# Patient Record
Sex: Female | Born: 1950 | Race: White | Hispanic: No | Marital: Single | Smoking: Never smoker
Health system: Southern US, Community
[De-identification: ages and names within clinical notes are randomized; demographics above are authoritative.]

## PROBLEM LIST (undated history)

## (undated) DIAGNOSIS — Z8541 Personal history of malignant neoplasm of cervix uteri: Secondary | ICD-10-CM

## (undated) DIAGNOSIS — S68412A Complete traumatic amputation of left hand at wrist level, initial encounter: Secondary | ICD-10-CM

## (undated) DIAGNOSIS — I82409 Acute embolism and thrombosis of unspecified deep veins of unspecified lower extremity: Secondary | ICD-10-CM

## (undated) DIAGNOSIS — C210 Malignant neoplasm of anus, unspecified: Secondary | ICD-10-CM

## (undated) DIAGNOSIS — I10 Essential (primary) hypertension: Secondary | ICD-10-CM

## (undated) DIAGNOSIS — E119 Type 2 diabetes mellitus without complications: Secondary | ICD-10-CM

## (undated) DIAGNOSIS — F329 Major depressive disorder, single episode, unspecified: Secondary | ICD-10-CM

## (undated) DIAGNOSIS — C539 Malignant neoplasm of cervix uteri, unspecified: Secondary | ICD-10-CM

## (undated) DIAGNOSIS — E782 Mixed hyperlipidemia: Secondary | ICD-10-CM

## (undated) DIAGNOSIS — F32A Depression, unspecified: Secondary | ICD-10-CM

## (undated) HISTORY — PX: CERVICAL CONE BIOPSY: SUR198

## (undated) HISTORY — DX: Mixed hyperlipidemia: E78.2

## (undated) HISTORY — DX: Acute embolism and thrombosis of unspecified deep veins of unspecified lower extremity: I82.409

## (undated) HISTORY — DX: Malignant neoplasm of cervix uteri, unspecified: C53.9

## (undated) HISTORY — PX: HAND AMPUTATION: SUR26

## (undated) HISTORY — DX: Type 2 diabetes mellitus without complications: E11.9

## (undated) HISTORY — DX: Malignant neoplasm of anus, unspecified: C21.0

## (undated) HISTORY — DX: Essential (primary) hypertension: I10

## (undated) NOTE — *Deleted (*Deleted)
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 Retrobulbar anesthesia was obtained in the left eye without complication  The  patient was prepped and draped in the usual fashion for ocular surgery on the  left eye .  A lid speculum was placed.  Infusion line and trocar was placed at the 4 o'clock position approximately 3.5 mm from the surgical limbus.   The infusion line was allowed to run and then clamped when placed at the cannula opening. The line was inserted and secured to the drape with an adhesive strip.   Active trocars/cannula were placed at the 10 and 2 o'clock positions approximately 3.5 mm from the surgical limbus. The cannula was visualized in the vitreous cavity.  The light pipe and vitreous cutter were inserted into the vitreous cavity and a core vitrectomy was performed.  Care taken to remove the vitreous up to the vitreous base for 360 degrees.   Attention was directed toward relieving the tractional detachment from the posterior pole in particular peripherally (temporally, inferiorly). This was done carefully at the disc and surrounding arcades. There was notable neovascular fronds with traction and associated hemorrhage inferiorly. Care was taken to elevate the membranes and remove them both with a vitrector and a light pipe with dissection. Hemostasis of the neovascular fronds was performed with endocautery and endolaser. Following hemostasis, continued dissection of membranes and removal of  membranes was performed including the temporal and inferior retina. Endolaser was applied to the areas where the neovascular fronds were still present.  3 rows of endolaser were applied 360 degrees to the periphery.  A complete air-fluid exchange was then performed and additional endolaser was applied. 12% SF6 gas was injected into the eye. The trocars were sequentially removed and all were noted to be airtight. Subconjunctival injections of Ancef and Decadron were placed.   The speculum and drapes were removed and the eye was patched with Polymixin/Bacitracin ophthalmic ointment. An eye shield was placed and the patient was transferred alert and conversant with stable vital signs to the post operative recovery area.  The patient tolerated the procedure well and no complications were noted.   Harrold Donath MD

---
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
## 2010-09-17 ENCOUNTER — Ambulatory Visit (HOSPITAL_COMMUNITY)
Admission: RE | Admit: 2010-09-17 | Discharge: 2010-09-17 | Disposition: A | Discharge status: Home / Self Care | Payer: Self-pay | Source: Ambulatory Visit | Attending: Family Medicine | Admitting: Family Medicine

## 2010-09-17 ENCOUNTER — Other Ambulatory Visit (HOSPITAL_COMMUNITY): Payer: Self-pay | Admitting: Family Medicine

## 2010-09-17 DIAGNOSIS — R05 Cough: Secondary | ICD-10-CM | POA: Insufficient documentation

## 2010-09-17 DIAGNOSIS — R059 Cough, unspecified: Secondary | ICD-10-CM | POA: Insufficient documentation

## 2010-09-17 DIAGNOSIS — R0789 Other chest pain: Secondary | ICD-10-CM | POA: Insufficient documentation

## 2010-09-20 ENCOUNTER — Emergency Department (HOSPITAL_COMMUNITY)
Admission: EM | Admit: 2010-09-20 | Discharge: 2010-09-20 | Disposition: A | Discharge status: Home / Self Care | Payer: Self-pay | Attending: Emergency Medicine | Admitting: Emergency Medicine

## 2010-09-20 DIAGNOSIS — Z79899 Other long term (current) drug therapy: Secondary | ICD-10-CM | POA: Insufficient documentation

## 2010-09-20 DIAGNOSIS — R112 Nausea with vomiting, unspecified: Secondary | ICD-10-CM | POA: Insufficient documentation

## 2010-09-20 DIAGNOSIS — R109 Unspecified abdominal pain: Secondary | ICD-10-CM | POA: Insufficient documentation

## 2010-09-20 DIAGNOSIS — S58119A Complete traumatic amputation at level between elbow and wrist, unspecified arm, initial encounter: Secondary | ICD-10-CM | POA: Insufficient documentation

## 2010-09-20 DIAGNOSIS — E119 Type 2 diabetes mellitus without complications: Secondary | ICD-10-CM | POA: Insufficient documentation

## 2012-01-12 ENCOUNTER — Other Ambulatory Visit (HOSPITAL_COMMUNITY): Payer: Self-pay | Admitting: Physician Assistant

## 2012-01-12 DIAGNOSIS — Z139 Encounter for screening, unspecified: Secondary | ICD-10-CM

## 2012-01-22 ENCOUNTER — Ambulatory Visit (HOSPITAL_COMMUNITY)
Admission: RE | Admit: 2012-01-22 | Discharge: 2012-01-22 | Disposition: A | Discharge status: Home / Self Care | Payer: Self-pay | Source: Ambulatory Visit | Attending: Physician Assistant | Admitting: Physician Assistant

## 2012-01-22 DIAGNOSIS — Z139 Encounter for screening, unspecified: Secondary | ICD-10-CM

## 2012-01-26 ENCOUNTER — Other Ambulatory Visit: Payer: Self-pay | Admitting: Physician Assistant

## 2012-01-26 DIAGNOSIS — R928 Other abnormal and inconclusive findings on diagnostic imaging of breast: Secondary | ICD-10-CM

## 2012-02-23 ENCOUNTER — Other Ambulatory Visit (HOSPITAL_COMMUNITY): Payer: Self-pay | Admitting: Physician Assistant

## 2012-02-23 DIAGNOSIS — R928 Other abnormal and inconclusive findings on diagnostic imaging of breast: Secondary | ICD-10-CM

## 2012-03-08 ENCOUNTER — Other Ambulatory Visit (HOSPITAL_COMMUNITY): Payer: Self-pay | Admitting: *Deleted

## 2012-03-08 DIAGNOSIS — R928 Other abnormal and inconclusive findings on diagnostic imaging of breast: Secondary | ICD-10-CM

## 2012-03-09 ENCOUNTER — Ambulatory Visit (HOSPITAL_COMMUNITY)
Admission: RE | Admit: 2012-03-09 | Discharge: 2012-03-09 | Disposition: A | Discharge status: Home / Self Care | Payer: PRIVATE HEALTH INSURANCE | Source: Ambulatory Visit | Attending: Physician Assistant | Admitting: Physician Assistant

## 2012-03-09 DIAGNOSIS — R928 Other abnormal and inconclusive findings on diagnostic imaging of breast: Secondary | ICD-10-CM | POA: Insufficient documentation

## 2012-04-10 ENCOUNTER — Telehealth: Payer: Self-pay | Admitting: Obstetrics and Gynecology

## 2012-04-10 NOTE — Telephone Encounter (Signed)
Entered in error

## 2012-10-03 ENCOUNTER — Ambulatory Visit (HOSPITAL_COMMUNITY)
Admission: RE | Admit: 2012-10-03 | Discharge: 2012-10-03 | Disposition: A | Discharge status: Home / Self Care | Payer: BC Managed Care – PPO | Source: Ambulatory Visit | Attending: Family Medicine | Admitting: Family Medicine

## 2012-10-03 ENCOUNTER — Other Ambulatory Visit (HOSPITAL_COMMUNITY): Payer: Self-pay | Admitting: Family Medicine

## 2012-10-03 DIAGNOSIS — M25539 Pain in unspecified wrist: Secondary | ICD-10-CM | POA: Insufficient documentation

## 2012-10-03 DIAGNOSIS — M25531 Pain in right wrist: Secondary | ICD-10-CM

## 2012-10-14 ENCOUNTER — Ambulatory Visit: Payer: Self-pay | Admitting: Physician Assistant

## 2012-12-01 ENCOUNTER — Other Ambulatory Visit (HOSPITAL_COMMUNITY): Payer: Self-pay | Admitting: *Deleted

## 2012-12-01 ENCOUNTER — Ambulatory Visit (HOSPITAL_COMMUNITY)
Admission: RE | Admit: 2012-12-01 | Discharge: 2012-12-01 | Disposition: A | Discharge status: Home / Self Care | Payer: BC Managed Care – PPO | Source: Ambulatory Visit | Attending: Family Medicine | Admitting: Family Medicine

## 2012-12-01 ENCOUNTER — Other Ambulatory Visit: Payer: Self-pay | Admitting: Genetic Counselor

## 2012-12-01 DIAGNOSIS — M25539 Pain in unspecified wrist: Secondary | ICD-10-CM | POA: Insufficient documentation

## 2012-12-01 DIAGNOSIS — R202 Paresthesia of skin: Secondary | ICD-10-CM

## 2012-12-01 DIAGNOSIS — R200 Anesthesia of skin: Secondary | ICD-10-CM

## 2013-09-06 ENCOUNTER — Other Ambulatory Visit: Payer: Self-pay | Admitting: Otolaryngology

## 2013-09-06 DIAGNOSIS — H9120 Sudden idiopathic hearing loss, unspecified ear: Secondary | ICD-10-CM

## 2013-09-12 ENCOUNTER — Ambulatory Visit
Admission: RE | Admit: 2013-09-12 | Discharge: 2013-09-12 | Disposition: A | Discharge status: Home / Self Care | Payer: BC Managed Care – PPO | Source: Ambulatory Visit | Attending: Otolaryngology | Admitting: Otolaryngology

## 2013-09-12 DIAGNOSIS — H9120 Sudden idiopathic hearing loss, unspecified ear: Secondary | ICD-10-CM

## 2013-09-12 MED ORDER — GADOBENATE DIMEGLUMINE 529 MG/ML IV SOLN
8.0000 mL | Freq: Once | INTRAVENOUS | Status: AC | PRN
  Administered 2013-09-12: 8 mL via INTRAVENOUS

## 2015-11-27 ENCOUNTER — Other Ambulatory Visit (HOSPITAL_COMMUNITY): Payer: Self-pay | Admitting: Family Medicine

## 2015-11-27 DIAGNOSIS — M7501 Adhesive capsulitis of right shoulder: Secondary | ICD-10-CM

## 2015-11-28 ENCOUNTER — Ambulatory Visit (HOSPITAL_COMMUNITY)
Admission: RE | Admit: 2015-11-28 | Discharge: 2015-11-28 | Disposition: A | Discharge status: Home / Self Care | Payer: Medicare HMO | Source: Ambulatory Visit | Attending: Family Medicine | Admitting: Family Medicine

## 2015-11-28 DIAGNOSIS — M25511 Pain in right shoulder: Secondary | ICD-10-CM | POA: Diagnosis present

## 2015-11-28 DIAGNOSIS — M7501 Adhesive capsulitis of right shoulder: Secondary | ICD-10-CM

## 2016-02-14 ENCOUNTER — Encounter (INDEPENDENT_AMBULATORY_CARE_PROVIDER_SITE_OTHER): Payer: Self-pay | Admitting: Ophthalmology

## 2016-10-01 ENCOUNTER — Other Ambulatory Visit (HOSPITAL_COMMUNITY): Payer: Self-pay | Admitting: Family Medicine

## 2016-10-01 ENCOUNTER — Ambulatory Visit (HOSPITAL_COMMUNITY)
Admission: RE | Admit: 2016-10-01 | Discharge: 2016-10-01 | Disposition: A | Discharge status: Home / Self Care | Payer: Medicare HMO | Source: Ambulatory Visit | Attending: Family Medicine | Admitting: Family Medicine

## 2016-10-01 DIAGNOSIS — R29898 Other symptoms and signs involving the musculoskeletal system: Secondary | ICD-10-CM | POA: Insufficient documentation

## 2016-10-01 DIAGNOSIS — I998 Other disorder of circulatory system: Secondary | ICD-10-CM | POA: Diagnosis not present

## 2017-03-18 DIAGNOSIS — H9122 Sudden idiopathic hearing loss, left ear: Secondary | ICD-10-CM | POA: Insufficient documentation

## 2017-03-18 DIAGNOSIS — H9113 Presbycusis, bilateral: Secondary | ICD-10-CM | POA: Insufficient documentation

## 2017-05-18 HISTORY — PX: CATARACT EXTRACTION, BILATERAL: SHX1313
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
## 2017-05-20 ENCOUNTER — Ambulatory Visit (INDEPENDENT_AMBULATORY_CARE_PROVIDER_SITE_OTHER): Payer: Medicare HMO | Admitting: Ophthalmology

## 2017-05-20 ENCOUNTER — Encounter (INDEPENDENT_AMBULATORY_CARE_PROVIDER_SITE_OTHER): Payer: Self-pay | Admitting: Ophthalmology

## 2017-05-20 DIAGNOSIS — H3581 Retinal edema: Secondary | ICD-10-CM | POA: Diagnosis not present

## 2017-05-20 DIAGNOSIS — E113493 Type 2 diabetes mellitus with severe nonproliferative diabetic retinopathy without macular edema, bilateral: Secondary | ICD-10-CM | POA: Diagnosis not present

## 2017-05-20 DIAGNOSIS — H4323 Crystalline deposits in vitreous body, bilateral: Secondary | ICD-10-CM

## 2017-05-20 DIAGNOSIS — H53001 Unspecified amblyopia, right eye: Secondary | ICD-10-CM

## 2017-05-20 DIAGNOSIS — H25813 Combined forms of age-related cataract, bilateral: Secondary | ICD-10-CM

## 2017-05-20 DIAGNOSIS — Z9889 Other specified postprocedural states: Secondary | ICD-10-CM

## 2017-05-21 ENCOUNTER — Encounter (INDEPENDENT_AMBULATORY_CARE_PROVIDER_SITE_OTHER): Payer: Self-pay | Admitting: Ophthalmology
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
## 2017-07-14 ENCOUNTER — Encounter (INDEPENDENT_AMBULATORY_CARE_PROVIDER_SITE_OTHER): Payer: Medicare HMO | Admitting: Ophthalmology

## 2019-01-24 ENCOUNTER — Ambulatory Visit: Payer: Medicare HMO | Admitting: General Surgery

## 2019-01-24 ENCOUNTER — Encounter: Payer: Self-pay | Admitting: General Surgery

## 2019-01-24 ENCOUNTER — Other Ambulatory Visit: Payer: Self-pay

## 2019-01-24 ENCOUNTER — Other Ambulatory Visit (HOSPITAL_COMMUNITY)
Admission: RE | Admit: 2019-01-24 | Discharge: 2019-01-24 | Disposition: A | Discharge status: Home / Self Care | Payer: Medicare HMO | Source: Ambulatory Visit | Attending: General Surgery | Admitting: General Surgery

## 2019-01-24 ENCOUNTER — Encounter (INDEPENDENT_AMBULATORY_CARE_PROVIDER_SITE_OTHER): Payer: Self-pay

## 2019-01-24 VITALS — BP 130/77 | HR 83 | Temp 97.3°F | Resp 16 | Ht 64.5 in | Wt 212.0 lb

## 2019-01-24 DIAGNOSIS — Z01812 Encounter for preprocedural laboratory examination: Secondary | ICD-10-CM | POA: Insufficient documentation

## 2019-01-24 DIAGNOSIS — Z20828 Contact with and (suspected) exposure to other viral communicable diseases: Secondary | ICD-10-CM | POA: Diagnosis not present

## 2019-01-24 DIAGNOSIS — K6289 Other specified diseases of anus and rectum: Secondary | ICD-10-CM

## 2019-01-24 MED ORDER — SILVER SULFADIAZINE 1 % EX CREA: TOPICAL_CREAM | CUTANEOUS | 1 refills | Status: DC

## 2019-01-24 NOTE — Patient Instructions (Addendum)
Continue to use barrier creams as needed like Desitin for your anal region.   Anal Cancer  Anal cancer is a disease where cancer cells form in the tissue of the anus. The anus the last part of the large intestine, where stool leaves the body. The most common type of anal cancer is called squamous cell carcinoma. What are the causes? The cause of this condition is not known. What increases the risk? You are more likely to develop this condition if:  You have HPV (human papillomavirus).  You engage in sexual practices that increase your risk of HPV. These include: ? Having many sexual partners. ? Having anal sex. ? Being female and having sex with other males.  You smoke cigarettes.  You have a history of cervical, vaginal, or vulvar cancer.  You have a weakened immune system due to: ? Medicines called immunosuppressants. ? Infection with HIV (human immunodeficiency virus) or AIDS (acquired immunodeficiency syndrome).  You have an autoimmune disorder like Crohn disease or psoriasis.  You have a history of STIs (sexually transmitted infections). What are the signs or symptoms? Symptoms of this condition include:  Pain or pressure around the anus.  Bleeding from the anus or rectum.  A lump near the anus.  A change in bowel habits.  Itching around the anus.  Discharge from the anus.  Swollen lymph nodes around the anus or groin. How is this diagnosed? This condition is diagnosed based on a review of your medical history and the results of a physical exam and tests. Exams and tests may include:  A digital rectal exam. During this exam, a gloved, lubricated finger is inserted into the anus and rectum to check for lumps.  Anoscopy. During this test, a hollow tube is inserted into the anus and rectum. A health care provider looks through the tube for lumps and signs of disease.  Proctoscopy. During this test, a lighted, hollow tube is inserted into the rectum. A health care  provider looks through this tube to see the lower part of the colon.  Endoanal or endorectal ultrasound. During this test, a small probe is inserted into the anus or rectum.  Biopsy. During this test, a tissue sample is taken from the anus and rectum to be examined under a microscope for signs of cancer. Your health care provider may refer you to an expert who specializes in diagnosing and treating anal cancer. If you are diagnosed with cancer, you may need to have more tests to see if the cancer has spread to other parts of the body. These tests can include:  X-ray.  CT scan.  MRI.  PET scan. How is this treated? Treatment for this condition depends on where the tumor is, the type of cancer, and how much the cancer has spread in the body (the stage of the cancer). Treatment can include any combination of the following:  Surgery. This may be done to remove the tumor and any lymph nodes that are infected with cancer. If the cancer is severe, surgery may be done to remove the anus, rectum, and part of the colon.  Radiation therapy. This treatment uses high energy radiation or X-rays to kill cancer cells or stop them from growing.  Chemotherapy. This treatment involves drugs that kill cancer cells or stop them from growing. Follow these instructions at home: Learning about your cancer  Learn about your cancer and your treatment options. Make sure you understand the potential side effects of treatment.  Ask about getting a  second opinion. This can help you make a more informed decision about your treatment options. Eating and drinking  Drink enough fluid to keep your urine clear or pale yellow.  Limit alcohol intake to no more than 1 drink a day for nonpregnant women and 2 drinks a day for men. One drink equals 12 oz of beer, 5 oz of wine, or 1 oz of hard liquor.  Eat a healthy diet. When planning meals: ? Aim to get 2  cups of fruits and vegetables a day. ? Choose high-fiber foods  like whole-grain breads and cereals. Activity  During and after treatment, return to your normal activities as told by your health care provider. Ask your health care provider what activities are safe for you.  Exercise regularly. Aim for 30 minutes of moderate-intensity exercise 5 days a week, such as walking or yoga.  Talk with your health care provider before starting any exercise routine. This is important. General instructions   Take over-the-counter and prescription medicines only as told by your health care provider.  Do not use any products that contain nicotine or tobacco, such as cigarettes and e-cigarettes. If you need help quitting, ask your health care provider.  Keep all follow-up visits as told by your health care provider. This is important. How is this prevented? You cannot prevent this condition completely, but you can lower your risk of developing it by:  Getting the HPV vaccine.  Avoiding infection with HPV and HIV. You can do this by: ? Limiting your number of sexual partners. ? Using protection, like condoms, during all sexual activity. Note that condoms cannot completely protect you from HPV.  Not smoking. If you need help quitting, ask your health care provider. Contact a health care provider if:  You have bleeding or discharge from your anus.  You have pain or pressure near your anus.  You have a change in bowel habits or diarrhea.  You have nausea or vomiting. Get help right away if:  You have a fever.  You have chest pains or shortness of breath.  You have a severe headache with a stiff neck.  You have bloody or cloudy urine.  You are confused.  You have any swelling in your legs or arms or around a wound. Summary  Anal cancer is a disease where cancer cells form in the tissue of the anus.  You are more likely to develop anal cancer if you are infected with HPV, you smoke cigarettes, or if you have a weakened immune system due to  medicines, HIV, or AIDS.  Treatment for anal cancer can include surgery, radiation therapy, or chemotherapy.  You can lower your risk for anal cancer by getting the HPV vaccine, not smoking, and avoiding infection with HPV and HIV. This information is not intended to replace advice given to you by your health care provider. Make sure you discuss any questions you have with your health care provider. Document Released: 03/23/2016 Document Revised: 03/23/2016 Document Reviewed: 03/23/2016 Elsevier Patient Education  2020 [employer].

## 2019-01-24 NOTE — Patient Instructions (Addendum)
Your procedure is scheduled on: 01/26/2019  Report to [facility] at    9:30 AM.  Call this number if you have problems the morning of surgery: [phone]   Remember:   Do not Eat or Drink after midnight   :  Take these medicines the morning of surgery with A SIP OF WATER: gabapentin, trintellix, and lisinopril.   Do not wear jewelry, make-up or nail polish.  Do not wear lotions, powders, or perfumes. You may wear deodorant.  Do not shave 48 hours prior to surgery. Men may shave face and neck.  Do not bring valuables to the hospital.  Contacts, dentures or bridgework may not be worn into surgery.  Leave suitcase in the car. After surgery it may be brought to your room.  For patients admitted to the hospital, checkout time is 11:00 AM the day of discharge.   Patients discharged the day of surgery will not be allowed to drive home.    Special Instructions: Shower using CHG night before surgery and shower the day of surgery use CHG.  Use special wash - you have one bottle of CHG for all showers.  You should use approximately 1/2 of the bottle for each shower.  Wound Care, Adult Taking care of your wound properly can help to prevent pain, infection, and scarring. It can also help your wound to heal more quickly. How to care for your wound Wound care      Follow instructions from your health care provider about how to take care of your wound. Make sure you: ? Wash your hands with soap and water before you change the bandage (dressing). If soap and water are not available, use hand sanitizer. ? Change your dressing as told by your health care provider. ? Leave stitches (sutures), skin glue, or adhesive strips in place. These skin closures may need to stay in place for 2 weeks or longer. If adhesive strip edges start to loosen and curl up, you may trim the loose edges. Do not remove adhesive strips completely unless your health care provider tells you to do that.  Check your wound area  every day for signs of infection. Check for: ? Redness, swelling, or pain. ? Fluid or blood. ? Warmth. ? Pus or a bad smell.  Ask your health care provider if you should clean the wound with mild soap and water. Doing this may include: ? Using a clean towel to pat the wound dry after cleaning it. Do not rub or scrub the wound. ? Applying a cream or ointment. Do this only as told by your health care provider. ? Covering the incision with a clean dressing.  Ask your health care provider when you can leave the wound uncovered.  Keep the dressing dry until your health care provider says it can be removed. Do not take baths, swim, use a hot tub, or do anything that would put the wound underwater until your health care provider approves. Ask your health care provider if you can take showers. You may only be allowed to take sponge baths. Medicines   If you were prescribed an antibiotic medicine, cream, or ointment, take or use the antibiotic as told by your health care provider. Do not stop taking or using the antibiotic even if your condition improves.  Take over-the-counter and prescription medicines only as told by your health care provider. If you were prescribed pain medicine, take it 30 or more minutes before you do any wound care or as  told by your health care provider. General instructions  Return to your normal activities as told by your health care provider. Ask your health care provider what activities are safe.  Do not scratch or pick at the wound.  Do not use any products that contain nicotine or tobacco, such as cigarettes and e-cigarettes. These may delay wound healing. If you need help quitting, ask your health care provider.  Keep all follow-up visits as told by your health care provider. This is important.  Eat a diet that includes protein, vitamin A, vitamin C, and other nutrient-rich foods to help the wound heal. ? Foods rich in protein include meat, dairy, beans, nuts,  and other sources. ? Foods rich in vitamin A include carrots and dark green, leafy vegetables. ? Foods rich in vitamin C include citrus, tomatoes, and other fruits and vegetables. ? Nutrient-rich foods have protein, carbohydrates, fat, vitamins, or minerals. Eat a variety of healthy foods including vegetables, fruits, and whole grains. Contact a health care provider if:  You received a tetanus shot and you have swelling, severe pain, redness, or bleeding at the injection site.  Your pain is not controlled with medicine.  You have redness, swelling, or pain around the wound.  You have fluid or blood coming from the wound.  Your wound feels warm to the touch.  You have pus or a bad smell coming from the wound.  You have a fever or chills.  You are nauseous or you vomit.  You are dizzy. Get help right away if:  You have a red streak going away from your wound.  The edges of the wound open up and separate.  Your wound is bleeding, and the bleeding does not stop with gentle pressure.  You have a rash.  You faint.  You have trouble breathing. Summary  Always wash your hands with soap and water before changing your bandage (dressing).  To help with healing, eat foods that are rich in protein, vitamin A, vitamin C, and other nutrients.  Check your wound every day for signs of infection. Contact your health care provider if you suspect that your wound is infected. This information is not intended to replace advice given to you by your health care provider. Make sure you discuss any questions you have with your health care provider. Document Released: 02/11/2008 Document Revised: 08/22/2018 Document Reviewed: 11/19/2015 Elsevier Patient Education  2020 [employer].

## 2019-01-24 NOTE — Progress Notes (Signed)
[facility] History and Physical  Reason for Referral: ? Rectal Fissure   Referring Physician: Dr. Cindie Laroche   Chief Complaint    Rectal Problems      Jacqueline Bird is a 68 y.o. female.  HPI: Jacqueline Bird is a 68 yo who reports starting to have issues with constipation in February that started without any change in her diet. She says that she takes some stool softeners, and will have a soft stool about every 3-4 days.  She has been having associated issues with severe burning of the anal region and does not feel like she can get clean. She says that she has to wipe multiple times and will stay soiled for 24 hours after her BM.  She reports that she has been using some silver nitrate cream she had from a prior burn on the area, and this is the only thing that gives her relief.  She does say that there is some blood on the toilet paper but not in the stool.  She denies any weight loss.  She says that she has never had a colonoscopy and she did have a cervical cone performed in 1980s for an abnormal pap smear.  She denies any tearing sensation or sharp pain with Bms. The stools are now mostly soft but she does have some pebbles at times. She describes mostly a severe raw/ burning feeling around her anus.  She has not had any type of anal or rectal exam since these issues started.   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, left ([place])    Depression    Diabetes mellitus without complication ([place])    History of cervical cancer    Hypertension     Past Surgical History:  Procedure Laterality Date   CATARACT EXTRACTION, BILATERAL     CERVICAL CONE BIOPSY     HAND AMPUTATION Left     Family History  Problem Relation Age of Onset   Cataracts Mother    Glaucoma Mother    Diabetes Father    Cataracts Sister    Diabetes Sister    Cataracts Brother    Diabetes Brother    Macular degeneration Maternal Aunt    Diabetes Maternal  Grandfather    Amblyopia Neg Hx    Blindness Neg Hx    Retinal detachment Neg Hx    Strabismus Neg Hx    Retinitis pigmentosa Neg Hx     Social History   Tobacco Use   Smoking status: Never Smoker   Smokeless tobacco: Never Used  Substance Use Topics   Alcohol use: Not Currently    Frequency: Never   Drug use: Never    Medications: I have reviewed the patient's current medications. Allergies as of 01/24/2019      Reactions   Sulfa Antibiotics    Sulfamethoxazole Other (See Comments)   Tongue swelling      Medication List       Accurate as of January 24, 2019 11:59 PM. If you have any questions, ask your nurse or doctor.        gabapentin 100 MG capsule Commonly known as: NEURONTIN Take 100 mg by mouth 3 (three) times daily.   Invokana 300 MG Tabs tablet Generic drug: canagliflozin Take 300 mg by mouth daily.   lisinopril 20 MG tablet Commonly known as: ZESTRIL Take 20 mg by mouth daily.   nabumetone 750 MG tablet Commonly known as: RELAFEN Take 750 mg by mouth  2 (two) times daily.   silver sulfADIAZINE 1 % cream Commonly known as: SILVADENE Apply to affected area daily Started by: Virl Cagey, MD   simvastatin 40 MG tablet Commonly known as: ZOCOR TAKE 1 TABLET BY MOUTH ONCE DAILY DISCONTINUE PRAVASTATIN   traZODone 50 MG tablet Commonly known as: DESYREL Take 50 mg by mouth at bedtime.   Tyler Aas FlexTouch 200 UNIT/ML Sopn Generic drug: Insulin Degludec   Trintellix 10 MG Tabs tablet Generic drug: vortioxetine HBr Take 10 mg by mouth daily.   Trulicity [id number] Sopn Generic drug: Dulaglutide INJECT THE CONTENTS OF ONE SYRINGE SUBCUTANEOUSLY ONCE A WEEK        ROS:  A comprehensive review of systems was negative except for: Gastrointestinal: positive for burning pain around anus, constipation Genitourinary: positive for no urinary incontinence or urgency  Blood pressure 130/77, pulse 83, temperature (!) 97.3 F (36.3  C), temperature source Tympanic, resp. rate 16, height 5' 4.5" (1.638 m), weight 212 lb (96.2 kg), SpO2 97 %. Physical Exam Vitals signs reviewed.  Constitutional:      Appearance: Normal appearance.  HENT:     Head: Normocephalic and atraumatic.     Nose: Nose normal.     Mouth/Throat:     Mouth: Mucous membranes are moist.  Eyes:     Extraocular Movements: Extraocular movements intact.     Pupils: Pupils are equal, round, and reactive to light.  Neck:     Musculoskeletal: Normal range of motion. No neck rigidity.  Cardiovascular:     Rate and Rhythm: Normal rate and regular rhythm.  Pulmonary:     Effort: Pulmonary effort is normal.     Breath sounds: Normal breath sounds.  Abdominal:     General: There is no distension.     Palpations: Abdomen is soft.     Tenderness: There is no abdominal tenderness.  Genitourinary:    Rectum: Mass present.     Comments: Rectal mass anterior with ulceration and irregular edge border extending from anal verge onto the perianal skin, internal exam with nodular mass anterior, tender Musculoskeletal: Normal range of motion.        General: No swelling.     Comments: Left hand prosthetic in place  Lymphadenopathy:     Cervical: No cervical adenopathy.     Lower Body: No right inguinal adenopathy. No left inguinal adenopathy.     Comments: Fatty groin but no appreciable nodes  Skin:    General: Skin is warm and dry.  Neurological:     General: No focal deficit present.     Mental Status: She is alert and oriented to person, place, and time.  Psychiatric:        Mood and Affect: Mood normal.        Behavior: Behavior normal.        Thought Content: Thought content normal.        Judgment: Judgment normal.       Results: None  Assessment & Plan:  Jacqueline Bird is a 68 y.o. female with an anal mass and an ulcerated lesions with irregular borders extending out into the perianal skin. This is anal cancer until proven otherwise.  She  has had a prior abnormal pap smear and cervical cone which correlates.  This explains her feelings of constipation and burning.    -Discussed that this is most likely anal cancer given the appearance. Discussed that anal cancer is mostly treated with the [facility] protocol  -Discussed getting a  tissue diagnosis with a biopsy under anesthesia given the sensitivity in the area and to allow for a better exam -I am unsure how long it has been since the patient's last pelvic exam and did not get to ask her this, but she will potentially need this investigated also in the near future, her prior cervical cone was in the 1980s -Discussed referral to Oncology and need for port a catheter in the future. She has just went through throat cancer with her son and I believe he passed, so she wants to take things one step at a time.  -Patient requested refill of silvadene cream. She had this and was using it on the area and says it is the only thing that has given her any relief from the burning. I suggested Desitin or other barrier cream, but she was very adamant and tearful about wanting to continue the silvadene.  I do not see any immediate harm in this, and have sent her in a refill at this time.  -We discussed COVID testing and isolation prior to any procedures   All questions were answered to the satisfaction of the patient and family.  The risk and benefits of exam under anesthesia and anal mass biopsy were discussed including but not limited to bleeding, infection, inadequate tissue for diagnosis.  After careful consideration, Jacqueline Bird has decided to proceed.   I spent over 60 minutes counseling this patient on the findings and my concerns, and speaking with her fiance about the same once he as brought into the room.  We discussed the need for biopsy to get a diagnosis and the plan moving forward as well as the risk stated above with the procedure.     Virl Cagey 01/25/2019, 1:43 PM

## 2019-01-25 ENCOUNTER — Encounter: Payer: Self-pay | Admitting: General Surgery

## 2019-01-25 ENCOUNTER — Encounter (HOSPITAL_COMMUNITY): Payer: Self-pay

## 2019-01-25 ENCOUNTER — Other Ambulatory Visit: Payer: Self-pay | Admitting: General Surgery

## 2019-01-25 ENCOUNTER — Other Ambulatory Visit: Payer: Self-pay

## 2019-01-25 ENCOUNTER — Encounter (HOSPITAL_COMMUNITY)
Admission: RE | Admit: 2019-01-25 | Discharge: 2019-01-25 | Disposition: A | Discharge status: Home / Self Care | Payer: Medicare HMO | Source: Ambulatory Visit | Attending: General Surgery | Admitting: General Surgery

## 2019-01-25 ENCOUNTER — Encounter (HOSPITAL_COMMUNITY): Payer: Self-pay | Admitting: Lab

## 2019-01-25 DIAGNOSIS — E119 Type 2 diabetes mellitus without complications: Secondary | ICD-10-CM | POA: Diagnosis not present

## 2019-01-25 DIAGNOSIS — Z833 Family history of diabetes mellitus: Secondary | ICD-10-CM | POA: Diagnosis not present

## 2019-01-25 DIAGNOSIS — Z9842 Cataract extraction status, left eye: Secondary | ICD-10-CM | POA: Diagnosis not present

## 2019-01-25 DIAGNOSIS — C210 Malignant neoplasm of anus, unspecified: Secondary | ICD-10-CM | POA: Diagnosis not present

## 2019-01-25 DIAGNOSIS — F329 Major depressive disorder, single episode, unspecified: Secondary | ICD-10-CM | POA: Diagnosis not present

## 2019-01-25 DIAGNOSIS — Z89112 Acquired absence of left hand: Secondary | ICD-10-CM | POA: Diagnosis not present

## 2019-01-25 DIAGNOSIS — Z820 Family history of epilepsy and other diseases of the nervous system: Secondary | ICD-10-CM | POA: Diagnosis not present

## 2019-01-25 DIAGNOSIS — Z9841 Cataract extraction status, right eye: Secondary | ICD-10-CM | POA: Diagnosis not present

## 2019-01-25 DIAGNOSIS — I10 Essential (primary) hypertension: Secondary | ICD-10-CM | POA: Diagnosis not present

## 2019-01-25 DIAGNOSIS — Z8541 Personal history of malignant neoplasm of cervix uteri: Secondary | ICD-10-CM | POA: Diagnosis not present

## 2019-01-25 DIAGNOSIS — R229 Localized swelling, mass and lump, unspecified: Secondary | ICD-10-CM | POA: Diagnosis present

## 2019-01-25 HISTORY — DX: Depression, unspecified: F32.A

## 2019-01-25 HISTORY — DX: Personal history of malignant neoplasm of cervix uteri: Z85.41

## 2019-01-25 HISTORY — DX: Complete traumatic amputation of left hand at wrist level, initial encounter: S68.412A

## 2019-01-25 LAB — CBC
HCT: 42.5 % (ref 36.0–46.0)
Hemoglobin: 13.5 g/dL (ref 12.0–15.0)
MCH: 29.4 pg (ref 26.0–34.0)
MCHC: 31.8 g/dL (ref 30.0–36.0)
MCV: 92.6 fL (ref 80.0–100.0)
Platelets: 235 10*3/uL (ref 150–400)
RBC: 4.59 MIL/uL (ref 3.87–5.11)
RDW: 12.5 % (ref 11.5–15.5)
WBC: 7.2 10*3/uL (ref 4.0–10.5)
nRBC: 0 % (ref 0.0–0.2)

## 2019-01-25 LAB — GLUCOSE, CAPILLARY: Glucose-Capillary: 132 mg/dL — ABNORMAL HIGH (ref 70–99)

## 2019-01-25 LAB — BASIC METABOLIC PANEL
Anion gap: 8 (ref 5–15)
BUN: 36 mg/dL — ABNORMAL HIGH (ref 8–23)
CO2: 25 mmol/L (ref 22–32)
Calcium: 9 mg/dL (ref 8.9–10.3)
Chloride: 104 mmol/L (ref 98–111)
Creatinine, Ser: 0.92 mg/dL (ref 0.44–1.00)
GFR calc Af Amer: >60 mL/min (ref >60)
GFR calc non Af Amer: >60 mL/min (ref >60)
Glucose, Bld: 125 mg/dL — ABNORMAL HIGH (ref 70–99)
Potassium: 4.2 mmol/L (ref 3.5–5.1)
Sodium: 137 mmol/L (ref 135–145)

## 2019-01-25 LAB — SARS CORONAVIRUS 2 (TAT 6-24 HRS): SARS Coronavirus 2: NEGATIVE

## 2019-01-25 NOTE — H&P (Signed)
[facility] History and Physical  Reason for Referral: ? Rectal Fissure   Referring Physician: Dr. Cindie Laroche      Chief Complaint    Rectal Problems      Jacqueline Bird is a 68 y.o. female.  HPI: Jacqueline Bird is a 68 yo who reports starting to have issues with constipation in February that started without any change in her diet. She says that she takes some stool softeners, and will have a soft stool about every 3-4 days.  She has been having associated issues with severe burning of the anal region and does not feel like she can get clean. She says that she has to wipe multiple times and will stay soiled for 24 hours after her BM.  She reports that she has been using some silver nitrate cream she had from a prior burn on the area, and this is the only thing that gives her relief.  She does say that there is some blood on the toilet paper but not in the stool.  She denies any weight loss.  She says that she has never had a colonoscopy and she did have a cervical cone performed in 1980s for an abnormal pap smear.  She denies any tearing sensation or sharp pain with Bms. The stools are now mostly soft but she does have some pebbles at times. She describes mostly a severe raw/ burning feeling around her anus.  She has not had any type of anal or rectal exam since these issues started.       Past Medical History:  Diagnosis Date  . 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 Depression   . Diabetes mellitus without complication ([place])   . History of cervical cancer   . Hypertension          Past Surgical History:  Procedure Laterality Date  . CATARACT EXTRACTION, BILATERAL    . CERVICAL CONE BIOPSY    . HAND AMPUTATION Left          Family History  Problem Relation Age of Onset  . Cataracts Mother   . Glaucoma Mother   . Diabetes Father   . Cataracts Sister   . Diabetes Sister   . Cataracts Brother   . Diabetes Brother   . Macular  degeneration Maternal Aunt   . Diabetes Maternal Grandfather   . Amblyopia Neg Hx   . Blindness Neg Hx   . Retinal detachment Neg Hx   . Strabismus Neg Hx   . Retinitis pigmentosa Neg Hx     Social History        Tobacco Use  . Smoking status: Never Smoker  . Smokeless tobacco: Never Used  Substance Use Topics  . Alcohol use: Not Currently    Frequency: Never  . Drug use: Never    Medications: I have reviewed the patient's current medications.      Allergies as of 01/24/2019      Reactions   Sulfa Antibiotics    Sulfamethoxazole Other (See Comments)   Tongue swelling         Medication List       Accurate as of January 24, 2019 11:59 PM. If you have any questions, ask your nurse or doctor.        gabapentin 100 MG capsule Commonly known as: NEURONTIN Take 100 mg by mouth 3 (three) times daily.   Invokana 300 MG Tabs tablet Generic drug: canagliflozin Take 300 mg by mouth daily.  lisinopril 20 MG tablet Commonly known as: ZESTRIL Take 20 mg by mouth daily.   nabumetone 750 MG tablet Commonly known as: RELAFEN Take 750 mg by mouth 2 (two) times daily.   silver sulfADIAZINE 1 % cream Commonly known as: SILVADENE Apply to affected area daily Started by: Virl Cagey, MD   simvastatin 40 MG tablet Commonly known as: ZOCOR TAKE 1 TABLET BY MOUTH ONCE DAILY DISCONTINUE PRAVASTATIN   traZODone 50 MG tablet Commonly known as: DESYREL Take 50 mg by mouth at bedtime.   Tyler Aas FlexTouch 200 UNIT/ML Sopn Generic drug: Insulin Degludec   Trintellix 10 MG Tabs tablet Generic drug: vortioxetine HBr Take 10 mg by mouth daily.   Trulicity [id number] Sopn Generic drug: Dulaglutide INJECT THE CONTENTS OF ONE SYRINGE SUBCUTANEOUSLY ONCE A WEEK        ROS:  A comprehensive review of systems was negative except for: Gastrointestinal: positive for burning pain around anus, constipation Genitourinary: positive for  no urinary incontinence or urgency  Blood pressure 130/77, pulse 83, temperature (!) 97.3 F (36.3 C), temperature source Tympanic, resp. rate 16, height 5' 4.5" (1.638 m), weight 212 lb (96.2 kg), SpO2 97 %. Physical Exam Vitals signs reviewed.  Constitutional:      Appearance: Normal appearance.  HENT:     Head: Normocephalic and atraumatic.     Nose: Nose normal.     Mouth/Throat:     Mouth: Mucous membranes are moist.  Eyes:     Extraocular Movements: Extraocular movements intact.     Pupils: Pupils are equal, round, and reactive to light.  Neck:     Musculoskeletal: Normal range of motion. No neck rigidity.  Cardiovascular:     Rate and Rhythm: Normal rate and regular rhythm.  Pulmonary:     Effort: Pulmonary effort is normal.     Breath sounds: Normal breath sounds.  Abdominal:     General: There is no distension.     Palpations: Abdomen is soft.     Tenderness: There is no abdominal tenderness.  Genitourinary:    Rectum: Mass present.     Comments: Rectal mass anterior with ulceration and irregular edge border extending from anal verge onto the perianal skin, internal exam with nodular mass anterior, tender Musculoskeletal: Normal range of motion.        General: No swelling.     Comments: Left hand prosthetic in place  Lymphadenopathy:     Cervical: No cervical adenopathy.     Lower Body: No right inguinal adenopathy. No left inguinal adenopathy.     Comments: Fatty groin but no appreciable nodes  Skin:    General: Skin is warm and dry.  Neurological:     General: No focal deficit present.     Mental Status: She is alert and oriented to person, place, and time.  Psychiatric:        Mood and Affect: Mood normal.        Behavior: Behavior normal.        Thought Content: Thought content normal.        Judgment: Judgment normal.       Results: None  Assessment & Plan:  Jacqueline Bird is a 68 y.o. female with an anal mass and an ulcerated lesions with  irregular borders extending out into the perianal skin. This is anal cancer until proven otherwise.  She has had a prior abnormal pap smear and cervical cone which correlates.  This explains her feelings of constipation and burning.    -  Discussed that this is most likely anal cancer given the appearance. Discussed that anal cancer is mostly treated with the [facility] protocol  -Discussed getting a tissue diagnosis with a biopsy under anesthesia given the sensitivity in the area and to allow for a better exam -I am unsure how long it has been since the patient's last pelvic exam and did not get to ask her this, but she will potentially need this investigated also in the near future, her prior cervical cone was in the 1980s -Discussed referral to Oncology and need for port a catheter in the future. She has just went through throat cancer with her son and I believe he passed, so she wants to take things one step at a time.  -Patient requested refill of silvadene cream. She had this and was using it on the area and says it is the only thing that has given her any relief from the burning. I suggested Desitin or other barrier cream, but she was very adamant and tearful about wanting to continue the silvadene.  I do not see any immediate harm in this, and have sent her in a refill at this time.  -We discussed COVID testing and isolation prior to any procedures   All questions were answered to the satisfaction of the patient and family.  The risk and benefits of exam under anesthesia and anal mass biopsy were discussed including but not limited to bleeding, infection, inadequate tissue for diagnosis.  After careful consideration, Celsie Stallard has decided to proceed.   I spent over 60 minutes counseling this patient on the findings and my concerns, and speaking with her fiance about the same once he as brought into the room.  We discussed the need for biopsy to get a diagnosis and the plan moving forward as  well as the risk stated above with the procedure.     Virl Cagey 01/25/2019, 1:43 PM

## 2019-01-25 NOTE — Progress Notes (Signed)
Patient originally scheduled for new patient oncology consult for 02/01/2019.  Patient called on 01/25/19 and requested the appointment be rescheduled due to a conflicting appointment.  Patient requested the 17th (which we did not have available) or the following week.  Asked the patient if she was certain she wanted to wait longer for an appointment to which she replied, "I'm in the shape I am in and is it really urgent?"  Appt rescheduled for  02/07/2019.  Dr. Constance Haw office notified.

## 2019-01-26 ENCOUNTER — Other Ambulatory Visit: Payer: Self-pay

## 2019-01-26 ENCOUNTER — Ambulatory Visit (HOSPITAL_COMMUNITY): Payer: Medicare HMO | Admitting: Anesthesiology

## 2019-01-26 ENCOUNTER — Encounter (HOSPITAL_COMMUNITY)
Admission: RE | Disposition: A | Discharge status: Home / Self Care | Payer: Self-pay | Source: Home / Self Care | Attending: General Surgery

## 2019-01-26 ENCOUNTER — Ambulatory Visit (HOSPITAL_COMMUNITY)
Admission: RE | Admit: 2019-01-26 | Discharge: 2019-01-26 | Disposition: A | Discharge status: Home / Self Care | Payer: Medicare HMO | Attending: General Surgery | Admitting: General Surgery

## 2019-01-26 ENCOUNTER — Encounter (HOSPITAL_COMMUNITY): Payer: Self-pay

## 2019-01-26 DIAGNOSIS — C210 Malignant neoplasm of anus, unspecified: Secondary | ICD-10-CM | POA: Insufficient documentation

## 2019-01-26 DIAGNOSIS — I10 Essential (primary) hypertension: Secondary | ICD-10-CM | POA: Insufficient documentation

## 2019-01-26 DIAGNOSIS — Z833 Family history of diabetes mellitus: Secondary | ICD-10-CM | POA: Insufficient documentation

## 2019-01-26 DIAGNOSIS — K6289 Other specified diseases of anus and rectum: Secondary | ICD-10-CM | POA: Diagnosis not present

## 2019-01-26 DIAGNOSIS — F329 Major depressive disorder, single episode, unspecified: Secondary | ICD-10-CM | POA: Insufficient documentation

## 2019-01-26 DIAGNOSIS — Z89112 Acquired absence of left hand: Secondary | ICD-10-CM | POA: Insufficient documentation

## 2019-01-26 DIAGNOSIS — E119 Type 2 diabetes mellitus without complications: Secondary | ICD-10-CM | POA: Diagnosis not present

## 2019-01-26 DIAGNOSIS — Z9841 Cataract extraction status, right eye: Secondary | ICD-10-CM | POA: Insufficient documentation

## 2019-01-26 DIAGNOSIS — Z8541 Personal history of malignant neoplasm of cervix uteri: Secondary | ICD-10-CM | POA: Insufficient documentation

## 2019-01-26 DIAGNOSIS — Z9842 Cataract extraction status, left eye: Secondary | ICD-10-CM | POA: Insufficient documentation

## 2019-01-26 DIAGNOSIS — Z820 Family history of epilepsy and other diseases of the nervous system: Secondary | ICD-10-CM | POA: Insufficient documentation

## 2019-01-26 HISTORY — PX: RECTAL EXAM UNDER ANESTHESIA: SHX6399

## 2019-01-26 HISTORY — PX: RECTAL BIOPSY: SHX2303

## 2019-01-26 LAB — HEMOGLOBIN A1C
Hgb A1c MFr Bld: 7.5 % — ABNORMAL HIGH (ref 4.8–5.6)
Mean Plasma Glucose: 169 mg/dL

## 2019-01-26 LAB — GLUCOSE, CAPILLARY: Glucose-Capillary: 84 mg/dL (ref 70–99)

## 2019-01-26 SURGERY — EXAM UNDER ANESTHESIA, RECTUM
Anesthesia: General

## 2019-01-26 MED ORDER — LIDOCAINE (ANORECTAL) 5 % EX GEL: 1.0000 "application " | Freq: Four times a day (QID) | CUTANEOUS | 2 refills | Status: DC | PRN

## 2019-01-26 MED ORDER — PROPOFOL 10 MG/ML IV BOLUS
INTRAVENOUS | Status: AC
  Filled 2019-01-26: qty 20

## 2019-01-26 MED ORDER — CHLORHEXIDINE GLUCONATE CLOTH 2 % EX PADS: 6.0000 | MEDICATED_PAD | Freq: Once | CUTANEOUS | Status: DC

## 2019-01-26 MED ORDER — DOCUSATE SODIUM 100 MG PO CAPS: 100.0000 mg | ORAL_CAPSULE | Freq: Two times a day (BID) | ORAL | 2 refills | Status: DC

## 2019-01-26 MED ORDER — BUPIVACAINE LIPOSOME 1.3 % IJ SUSP
INTRAMUSCULAR | Status: DC | PRN
  Administered 2019-01-26: 20 mL

## 2019-01-26 MED ORDER — GLYCOPYRROLATE PF 0.2 MG/ML IJ SOSY
PREFILLED_SYRINGE | INTRAMUSCULAR | Status: DC | PRN
  Administered 2019-01-26: .2 mg via INTRAVENOUS

## 2019-01-26 MED ORDER — FENTANYL CITRATE (PF) 100 MCG/2ML IJ SOLN
INTRAMUSCULAR | Status: DC | PRN
  Administered 2019-01-26: 25 ug via INTRAVENOUS
  Administered 2019-01-26 (×2): 50 ug via INTRAVENOUS
  Administered 2019-01-26: 25 ug via INTRAVENOUS
  Administered 2019-01-26: 50 ug via INTRAVENOUS

## 2019-01-26 MED ORDER — SUCCINYLCHOLINE CHLORIDE 200 MG/10ML IV SOSY
PREFILLED_SYRINGE | INTRAVENOUS | Status: AC
  Filled 2019-01-26: qty 10

## 2019-01-26 MED ORDER — LIDOCAINE 2% (20 MG/ML) 5 ML SYRINGE
INTRAMUSCULAR | Status: AC
  Filled 2019-01-26: qty 5

## 2019-01-26 MED ORDER — SODIUM CHLORIDE 0.9 % IV SOLN
2.0000 g | INTRAVENOUS | Status: DC
  Filled 2019-01-26: qty 2

## 2019-01-26 MED ORDER — LIDOCAINE VISCOUS HCL 2 % MT SOLN
OROMUCOSAL | Status: DC | PRN
  Administered 2019-01-26: 1

## 2019-01-26 MED ORDER — ARTIFICIAL TEARS OPHTHALMIC OINT
TOPICAL_OINTMENT | OPHTHALMIC | Status: AC
  Filled 2019-01-26: qty 3.5

## 2019-01-26 MED ORDER — ONDANSETRON HCL 4 MG/2ML IJ SOLN
INTRAMUSCULAR | Status: DC | PRN
  Administered 2019-01-26: 4 mg via INTRAVENOUS

## 2019-01-26 MED ORDER — LACTATED RINGERS IV SOLN
INTRAVENOUS | Status: DC | PRN
  Administered 2019-01-26 (×2): via INTRAVENOUS

## 2019-01-26 MED ORDER — LIDOCAINE 2% (20 MG/ML) 5 ML SYRINGE
INTRAMUSCULAR | Status: DC | PRN
  Administered 2019-01-26: 40 mg via INTRAVENOUS

## 2019-01-26 MED ORDER — ONDANSETRON HCL 4 MG/2ML IJ SOLN
INTRAMUSCULAR | Status: AC
  Filled 2019-01-26: qty 4

## 2019-01-26 MED ORDER — SODIUM CHLORIDE 0.9 % IR SOLN
Status: DC | PRN
  Administered 2019-01-26: 1000 mL

## 2019-01-26 MED ORDER — PROPOFOL 10 MG/ML IV BOLUS
INTRAVENOUS | Status: DC | PRN
  Administered 2019-01-26: 50 mg via INTRAVENOUS

## 2019-01-26 MED ORDER — OXYCODONE HCL 5 MG PO TABS: 5.0000 mg | ORAL_TABLET | ORAL | 0 refills | Status: DC | PRN

## 2019-01-26 MED ORDER — PROPOFOL 10 MG/ML IV BOLUS
INTRAVENOUS | Status: AC
  Filled 2019-01-26: qty 40

## 2019-01-26 MED ORDER — FENTANYL CITRATE (PF) 250 MCG/5ML IJ SOLN
INTRAMUSCULAR | Status: AC
  Filled 2019-01-26: qty 5

## 2019-01-26 MED ORDER — LIDOCAINE VISCOUS HCL 2 % MT SOLN
OROMUCOSAL | Status: AC
  Filled 2019-01-26: qty 15

## 2019-01-26 MED ORDER — MIDAZOLAM HCL 2 MG/2ML IJ SOLN
INTRAMUSCULAR | Status: AC
  Filled 2019-01-26: qty 2

## 2019-01-26 SURGICAL SUPPLY — 29 items
CLOTH BEACON ORANGE TIMEOUT ST (SAFETY) ×4 IMPLANT
COVER LIGHT HANDLE STERIS (MISCELLANEOUS) ×8 IMPLANT
COVER WAND RF STERILE (DRAPES) ×4 IMPLANT
DRAPE HALF SHEET 40X57 (DRAPES) ×4 IMPLANT
DRSG TELFA 3X8 NADH (GAUZE/BANDAGES/DRESSINGS) ×4 IMPLANT
ELECT REM PT RETURN 9FT ADLT (ELECTROSURGICAL) ×4
ELECTRODE REM PT RTRN 9FT ADLT (ELECTROSURGICAL) ×2 IMPLANT
GAUZE SPONGE 4X4 12PLY STRL (GAUZE/BANDAGES/DRESSINGS) ×8 IMPLANT
GLOVE BIO SURGEON STRL SZ 6.5 (GLOVE) ×6 IMPLANT
GLOVE BIO SURGEONS STRL SZ 6.5 (GLOVE) ×2
GLOVE BIOGEL PI IND STRL 6.5 (GLOVE) ×2 IMPLANT
GLOVE BIOGEL PI IND STRL 7.0 (GLOVE) ×4 IMPLANT
GLOVE BIOGEL PI INDICATOR 6.5 (GLOVE) ×2
GLOVE BIOGEL PI INDICATOR 7.0 (GLOVE) ×4
GOWN STRL REUS W/TWL LRG LVL3 (GOWN DISPOSABLE) ×8 IMPLANT
HEMOSTAT SURGICEL 4X8 (HEMOSTASIS) ×4 IMPLANT
KIT TURNOVER CYSTO (KITS) ×4 IMPLANT
MANIFOLD NEPTUNE II (INSTRUMENTS) ×4 IMPLANT
NEEDLE HYPO 18GX1.5 BLUNT FILL (NEEDLE) ×4 IMPLANT
NEEDLE HYPO 21X1.5 SAFETY (NEEDLE) ×4 IMPLANT
NS IRRIG 1000ML POUR BTL (IV SOLUTION) ×4 IMPLANT
PACK PERI GYN (CUSTOM PROCEDURE TRAY) ×4 IMPLANT
PAD ARMBOARD 7.5X6 YLW CONV (MISCELLANEOUS) ×4 IMPLANT
SET BASIN LINEN APH (SET/KITS/TRAYS/PACK) ×4 IMPLANT
SPONGE SURGIFOAM ABS GEL 100 (HEMOSTASIS) ×4 IMPLANT
SURGILUBE 3G PEEL PACK STRL (MISCELLANEOUS) ×4 IMPLANT
SUT SILK 0 FSL (SUTURE) ×4 IMPLANT
SYR 20ML LL LF (SYRINGE) ×4 IMPLANT
SYR BULB IRRIGATION 50ML (SYRINGE) ×4 IMPLANT

## 2019-01-26 NOTE — Interval H&P Note (Signed)
History and Physical Interval Note:  01/26/2019 11:11 AM  Jacqueline Bird  has presented today for surgery, with the diagnosis of anal mass.  The various methods of treatment have been discussed with the patient and family. After consideration of risks, benefits and other options for treatment, the patient has consented to  Procedure(s): RECTAL EXAM UNDER ANESTHESIA (N/A) as a surgical intervention.  The patient's history has been reviewed, patient examined, no change in status, stable for surgery.  I have reviewed the patient's chart and labs.  Questions were answered to the patient's satisfaction.    No changes.  Virl Cagey

## 2019-01-26 NOTE — Progress Notes (Signed)
[facility]  Notified Eve[place], fiance, of the surgery. Rx sent in. Expect some bleeding and pain. Sitz baths, pain meds, lidocaine recommended.  Curlene Labrum, MD [facility] [address] [place], [place] [address] [phone] (office)

## 2019-01-26 NOTE — Transfer of Care (Signed)
Immediate Anesthesia Transfer of Care Note  Patient: Kamaryn Antes  Procedure(s) Performed: RECTAL EXAM UNDER ANESTHESIA (N/A ) BIOPSY OF ANAL MASS  Patient Location: PACU  Anesthesia Type:General  Level of Consciousness: awake, alert , oriented and patient cooperative  Airway & Oxygen Therapy: Patient Spontanous Breathing  Post-op Assessment: Report given to RN and Post -op Vital signs reviewed and stable  Post vital signs: Reviewed and stable  Last Vitals:  Vitals Value Taken Time  BP 97/62 01/26/19 1230  Temp 36.6 C 01/26/19 1228  Pulse 87 01/26/19 1231  Resp 15 01/26/19 1231  SpO2 96 % 01/26/19 1231  Vitals shown include unvalidated device data.  Last Pain:  Vitals:   01/26/19 1228  TempSrc:   PainSc: (P) 0-No pain      Patients Stated Pain Goal: (P) 6 ([id number])  Complications: No apparent anesthesia complications

## 2019-01-26 NOTE — Op Note (Addendum)
[facility] Operative Note  01/26/19  Preoperative Diagnosis: Anal mass extending onto anal skin   Postoperative Diagnosis: Same   Procedure(s) Performed: Exam under anesthesia, biopsy of anal mass and ulcerative margins   Surgeon: Lanell Matar. Constance Haw, MD   Assistants: No qualified resident was available    Anesthesia: General endotracheal   Anesthesiologist: Lenice Llamas, MD    Specimens:  Anal mass internal, ulcerative edge perineum, ulcerative edge left buttock    Estimated Blood Loss: Minimal   Blood Replacement: None    Complications: None   Wound Class: Dirty   Operative Indications: Jacqueline Bird is a lovely 68 yo that presented with an anal mass that had been causing her constipation and burning symptoms. I saw her in the clinic, and told her this was anal cancer and that we needed a biopsy to confirm the diagnosis and get her treatment.  Her last pelvic exam was about 2.5 years ago she reports.  We discussed the risk of bleeding, infection, pain, inadequate tissue sampling, and she opted to proceed.   Findings: Anal mass extending 4cm internally from the anal verge, anterior to left wall, occupying at least 50% of the circumferentially, extending out into the perineal area anterior, left and right buttock extending out from anal verge onto the buttock skin at least 4-5cm    Procedure: The patient was taken to the operating room and placed supine. General endotracheal anesthesia was induced. Intravenous antibiotics were administered per protocol. She was then placed in lithotomy with all pressure points padded. The perineal area and anus were prepared and draped in the usual sterile fashion.   A internal vagina exam was performed and no obvious masses were noted in the internal exam.  On observing the perianal skin, there is deep ulcer with from the anal verge with abnormal edges measuring 4-5 cm on the left buttock, and 4cm on the right buttock, and  perineal area to the vaginal opening.    On digital exam, the mass is in the anterior anal canal extending to the left side wall. There is at least 50% of the circumference with a large mass extending 4cm internally with a large cuff on the superior border.   Multiple sharp biopsies of the internal anal mass taken in the anterior and left side wall.  Biopsies of the ulcerative edge on the perineum and left buttock also taken.  Electrocautery and direct pressure used to ensure hemostasis.  Lidocaine gel soaked gel foam with surgicel wrapped around it inserted into the anus to help with hemostasis post op.  Xparel placed around the margins.   Final inspection revealed acceptable hemostasis. All counts were correct at the end of the case. The patient was awakened from anesthesia and extubated without complication.  The patient went to the PACU in stable condition.   Curlene Labrum, MD [facility] [address] [place], [place] [address] [phone] (office)

## 2019-01-26 NOTE — Anesthesia Procedure Notes (Signed)
Procedure Name: LMA Insertion Date/Time: 01/26/2019 11:29 AM Performed by: Adams, Amy A, CRNA Pre-anesthesia Checklist: Patient identified, Emergency Drugs available, Suction available and Patient being monitored Oxygen Delivery Method: Circle system utilized Preoxygenation: Pre-oxygenation with 100% oxygen Induction Type: IV induction LMA: LMA inserted LMA Size: 4.0 Placement Confirmation: positive ETCO2 and breath sounds checked- equal and bilateral Tube secured with: Tape Dental Injury: Teeth and Oropharynx as per pre-operative assessment

## 2019-01-26 NOTE — Anesthesia Preprocedure Evaluation (Addendum)
Anesthesia Evaluation  Patient identified by MRN, date of birth, ID band Patient awake    Reviewed: Allergy & Precautions, NPO status , Patient's Chart, lab work & pertinent test results  Airway Mallampati: II  TM Distance: >3 FB Neck ROM: Full    Dental no notable dental hx. (+) Missing Missing one upper and one lower tooth:   Pulmonary neg pulmonary ROS,    Pulmonary exam normal breath sounds clear to auscultation       Cardiovascular Exercise Tolerance: Good hypertension, Pt. on medications negative cardio ROS Normal cardiovascular examI Rhythm:Regular Rate:Normal  Walked over 6 miles two days ago   Neuro/Psych Depression negative neurological ROS  negative psych ROS   GI/Hepatic negative GI ROS, Neg liver ROS,   Endo/Other  negative endocrine ROSdiabetes, Well Controlled, Type 1, Insulin Dependent, Oral Hypoglycemic Agents  Renal/GU negative Renal ROS  negative genitourinary   Musculoskeletal negative musculoskeletal ROS (+)   Abdominal   Peds negative pediatric ROS (+)  Hematology negative hematology ROS (+)   Anesthesia Other Findings H/o Cervical Ca in the 1980s -conizationat that time  H/o Traumatic amp L wrist   Reproductive/Obstetrics negative OB ROS                             Anesthesia Physical Anesthesia Plan  ASA: II  Anesthesia Plan: General   Post-op Pain Management:    Induction: Intravenous  PONV Risk Score and Plan: 3 and Propofol infusion, Dexamethasone, Ondansetron and Treatment may vary due to age or medical condition  Airway Management Planned: LMA  Additional Equipment:   Intra-op Plan:   Post-operative Plan: Extubation in OR  Informed Consent: I have reviewed the patients History and Physical, chart, labs and discussed the procedure including the risks, benefits and alternatives for the proposed anesthesia with the patient or authorized  representative who has indicated his/her understanding and acceptance.     Dental advisory given  Plan Discussed with: CRNA  Anesthesia Plan Comments: (Plan Full PPE use  Plan GA (LMA) with GETA as needed d/w pt -WTP with same after Q&A)        Anesthesia Quick Evaluation

## 2019-01-26 NOTE — Discharge Instructions (Signed)
Discharge Instructions: Please take your roxicodone as prescribed and alternate with tylenol every 4-6 hours.   Do not take any aspirin or NSAIDs, ibuprofen, aleve, BC powder for 5 days.  After 01/31/19 you can start taking these medications again.  Please keep the area clean and dry and take Sitz baths (swallow warm water baths) for comfort and after bowel movements.  If you cannot get in a bath tub, you can purchase a Sitz bath that goes on the toilet at the pharmacy.  Please keep your stools soft and take fiber daily (metamucil) and colace (over the counter) to help prevent constipation.  If you have not had a BM in 2 days, please take Miralax, and if you have not had a BM after this, please notify Dr. Constance Haw.  Expect some bleeding following the surgery and significant pain.  Go to the ED with extensive bleeding (soaking 2 large pads in < 1 hour), fevers, or chills.    Shower per your regular routine daily.   Rest and listen to your body, but do not remain in bed all day.  Walk everyday for at least 15-20 minutes. Deep cough and move around every 1-2 hours in the first few days after surgery.  Do not lift > 10 lbs, perform excessive bending, pushing, pulling, squatting. You will have pressure and pain at your anus and this is common. You may want to obtain a donut pillow from your pharmacy to sit on.   Some nausea is common and poor appetite after anesthesia. The main goal is to stay hydrated the first few days after surgery.   Contact Information: If you have questions or concerns, please call our office, [phone], Monday- Thursday 8AM-5PM and Friday 8AM-12Noon.  If it is after hours or on the weekend, please call [facility] Main Number, [phone], and ask to speak to the surgeon on call for Dr. Constance Haw at [facility].     How to Take a [employer] A sitz bath is a warm water bath that may be used to care for your rectum, genital area, or the area between your rectum and genitals  (perineum). For a sitz bath, the water only comes up to your hips and covers your buttocks. A sitz bath may done at home in a bathtub or with a portable sitz bath that fits over the toilet. Your health care provider may recommend a sitz bath to help:  Relieve pain and discomfort after delivering a baby.  Relieve pain and itching from hemorrhoids or anal fissures.  Relieve pain after certain surgeries.  Relax muscles that are sore or tight. How to take a sitz bath Take 3-4 sitz baths a day, or as many as told by your health care provider. Bathtub sitz bath To take a sitz bath in a bathtub: 1. Partially fill a bathtub with warm water. The water should be deep enough to cover your hips and buttocks when you are sitting in the tub. 2. If your health care provider told you to put medicine in the water, follow his or her instructions. 3. Sit in the water. 4. Open the tub drain a little, and leave it open during your bath. 5. Turn on the warm water again, enough to replace the water that is draining out. Keep the water running throughout your bath. This helps keep the water at the right level and the right temperature. 6. Soak in the water for 15-20 minutes, or as long as told by your health care provider. 7.  When you are done, be careful when you stand up. You may feel dizzy. 8. After the sitz bath, pat yourself dry. Do not rub your skin to dry it.  Over-the-toilet sitz bath To take a sitz bath with an over-the-toilet basin: 1. Follow the manufacturer's instructions. 2. Fill the basin with warm water. 3. If your health care provider told you to put medicine in the water, follow his or her instructions. 4. Sit on the seat. Make sure the water covers your buttocks and perineum. 5. Soak in the water for 15-20 minutes, or as long as told by your health care provider. 6. After the sitz bath, pat yourself dry. Do not rub your skin to dry it. 7. Clean and dry the basin between uses. 8. Discard the  basin if it cracks, or according to the manufacturer's instructions. Contact a health care provider if:  Your symptoms get worse. Do not continue with sitz baths if your symptoms get worse.  You have new symptoms. If this happens, do not continue with sitz baths until you talk with your health care provider. Summary  A sitz bath is a warm water bath in which the water only comes up to your hips and covers your buttocks.  A sitz bath may help relieve itching, relieve pain, and relax muscles that are sore or tight in the lower part of your body, including your genital area.  Take 3-4 sitz baths a day, or as many as told by your health care provider. Soak in the water for 15-20 minutes.  Do not continue with sitz baths if your symptoms get worse. This information is not intended to replace advice given to you by your health care provider. Make sure you discuss any questions you have with your health care provider. Document Released: 01/25/2004 Document Revised: 05/06/2017 Document Reviewed: 05/06/2017 Elsevier Patient Education  2020 [employer] POST-ANESTHESIA  IMMEDIATELY FOLLOWING SURGERY:  Do not drive or operate machinery for the first twenty four hours after surgery.  Do not make any important decisions for twenty four hours after surgery or while taking narcotic pain medications or sedatives.  If you develop intractable nausea and vomiting or a severe headache please notify your doctor immediately.  FOLLOW-UP:  Please make an appointment with your surgeon as instructed. You do not need to follow up with anesthesia unless specifically instructed to do so.  WOUND CARE INSTRUCTIONS (if applicable):  Keep a dry clean dressing on the anesthesia/puncture wound site if there is drainage.  Once the wound has quit draining you may leave it open to air.  Generally you should leave the bandage intact for twenty four hours unless there is drainage.  If the epidural site  drains for more than 36-48 hours please call the anesthesia department.  QUESTIONS?:  Please feel free to call your physician or the hospital operator if you have any questions, and they will be happy to assist you.

## 2019-01-26 NOTE — Anesthesia Postprocedure Evaluation (Signed)
Anesthesia Post Note  Patient: Jacqueline Bird  Procedure(s) Performed: RECTAL EXAM UNDER ANESTHESIA (N/A ) BIOPSY OF ANAL MASS  Patient location during evaluation: PACU Anesthesia Type: General Level of consciousness: awake and alert and oriented Pain management: pain level controlled Vital Signs Assessment: post-procedure vital signs reviewed and stable Respiratory status: spontaneous breathing Cardiovascular status: blood pressure returned to baseline and stable Postop Assessment: no apparent nausea or vomiting Anesthetic complications: no     Last Vitals:  Vitals:   01/26/19 1230 01/26/19 1245  BP: 97/62 (!) 114/46  Pulse: 87 81  Resp: 18 18  Temp:    SpO2: 94% 94%    Last Pain:  Vitals:   01/26/19 1245  TempSrc:   PainSc: 0-No pain                 Chanon Loney

## 2019-01-27 ENCOUNTER — Encounter (HOSPITAL_COMMUNITY): Payer: Self-pay | Admitting: General Surgery

## 2019-01-30 ENCOUNTER — Telehealth: Payer: Self-pay | Admitting: General Surgery

## 2019-01-30 NOTE — Telephone Encounter (Addendum)
[facility]  Called to check on patient after biopsy and to notify of the pathology. Tried the patient twice and did leave one message. Finally able to finally get in touch with her. She says she is hurting no worse than she was prior.   She has an appt with Dr. Delton Coombes next week.   Pathology: Diagnosis 1. 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 Soft tissue, biopsy, perineum ulceration edge - SQUAMOUS CELL CARCINOMA - SEE COMMENT 3. Soft tissue, biopsy, left buttock ulceration edge - SQUAMOUS CELL CARCINOMA - SEE COMMENT  Jacqueline Labrum, MD [facility] [address] [place], [place] [address] [phone] (office)

## 2019-02-01 ENCOUNTER — Telehealth: Payer: Self-pay | Admitting: General Surgery

## 2019-02-01 ENCOUNTER — Ambulatory Visit (HOSPITAL_COMMUNITY): Payer: Medicare HMO | Admitting: Hematology

## 2019-02-01 DIAGNOSIS — C210 Malignant neoplasm of anus, unspecified: Secondary | ICD-10-CM

## 2019-02-01 NOTE — Telephone Encounter (Signed)
[facility]  Called Ms. Florene Glen and have discussed port placement as Dr. Delton Coombes suggested going ahead and getting this scheduled as long as she is wanting to pursue treatment.   She is in agreement. Discussed risk of bleeding, infection, pneumothorax, and plan to go on her left side first if possible given that she is right handed (left hand amputated). Discussed COVID testing and isolation after. Discussed she would still be able to go to Dr. Kendrick Fries.  Planning to try for Wednesday 9/23.  Curlene Labrum, MD [facility] [address] [place], [place] [address] [phone] (office)

## 2019-02-02 ENCOUNTER — Encounter (HOSPITAL_COMMUNITY): Payer: Self-pay

## 2019-02-02 ENCOUNTER — Other Ambulatory Visit: Payer: Self-pay

## 2019-02-03 ENCOUNTER — Encounter (HOSPITAL_COMMUNITY)
Admission: RE | Admit: 2019-02-03 | Discharge: 2019-02-03 | Disposition: A | Discharge status: Home / Self Care | Payer: Medicare HMO | Source: Ambulatory Visit | Attending: General Surgery | Admitting: General Surgery

## 2019-02-06 ENCOUNTER — Encounter (HOSPITAL_COMMUNITY): Payer: Self-pay | Admitting: Surgery

## 2019-02-06 ENCOUNTER — Other Ambulatory Visit (HOSPITAL_COMMUNITY)
Admission: RE | Admit: 2019-02-06 | Discharge: 2019-02-06 | Disposition: A | Discharge status: Home / Self Care | Payer: Medicare HMO | Source: Ambulatory Visit | Attending: General Surgery | Admitting: General Surgery

## 2019-02-07 ENCOUNTER — Other Ambulatory Visit (HOSPITAL_COMMUNITY)
Admission: RE | Admit: 2019-02-07 | Discharge: 2019-02-07 | Disposition: A | Discharge status: Home / Self Care | Payer: Medicare HMO | Source: Ambulatory Visit | Attending: General Surgery | Admitting: General Surgery

## 2019-02-07 ENCOUNTER — Other Ambulatory Visit: Payer: Self-pay

## 2019-02-07 ENCOUNTER — Encounter (HOSPITAL_COMMUNITY): Payer: Self-pay | Admitting: Hematology

## 2019-02-07 ENCOUNTER — Inpatient Hospital Stay (HOSPITAL_COMMUNITY): Payer: Medicare HMO | Attending: Hematology | Admitting: Hematology

## 2019-02-07 VITALS — BP 145/55 | HR 70 | Temp 98.1°F | Resp 16 | Ht 64.0 in | Wt 210.4 lb

## 2019-02-07 DIAGNOSIS — Z8541 Personal history of malignant neoplasm of cervix uteri: Secondary | ICD-10-CM | POA: Diagnosis not present

## 2019-02-07 DIAGNOSIS — Z01812 Encounter for preprocedural laboratory examination: Secondary | ICD-10-CM | POA: Insufficient documentation

## 2019-02-07 DIAGNOSIS — C210 Malignant neoplasm of anus, unspecified: Secondary | ICD-10-CM | POA: Diagnosis not present

## 2019-02-07 DIAGNOSIS — F329 Major depressive disorder, single episode, unspecified: Secondary | ICD-10-CM | POA: Insufficient documentation

## 2019-02-07 DIAGNOSIS — Z803 Family history of malignant neoplasm of breast: Secondary | ICD-10-CM | POA: Insufficient documentation

## 2019-02-07 DIAGNOSIS — E1140 Type 2 diabetes mellitus with diabetic neuropathy, unspecified: Secondary | ICD-10-CM | POA: Insufficient documentation

## 2019-02-07 DIAGNOSIS — I10 Essential (primary) hypertension: Secondary | ICD-10-CM | POA: Insufficient documentation

## 2019-02-07 DIAGNOSIS — E119 Type 2 diabetes mellitus without complications: Secondary | ICD-10-CM | POA: Diagnosis not present

## 2019-02-07 DIAGNOSIS — Z20828 Contact with and (suspected) exposure to other viral communicable diseases: Secondary | ICD-10-CM | POA: Insufficient documentation

## 2019-02-07 DIAGNOSIS — G629 Polyneuropathy, unspecified: Secondary | ICD-10-CM | POA: Insufficient documentation

## 2019-02-07 LAB — SARS CORONAVIRUS 2 (TAT 6-24 HRS): SARS Coronavirus 2: NEGATIVE

## 2019-02-07 NOTE — Progress Notes (Signed)
[facility] NOTE  Patient Care Team: Lucia Gaskins, MD as PCP - General (Internal Medicine)  CHIEF COMPLAINTS/PURPOSE OF CONSULTATION:  Squamous cell carcinoma of the anus.  HISTORY OF PRESENTING ILLNESS:  Jacqueline Bird 68 y.o. female is seen in consultation today at the request of Dr. Constance Haw for newly diagnosed squamous cell carcinoma of the anus.  Patient reported feeling raw in the anal area since March.  Because of COVID-19, she did not seek immediate medical attention.  She used OTC creams including Monistat during that time.  As her symptoms did not improve, she was evaluated by Dr. Constance Haw and a biopsy of the anal canal mass as well as perianal ulcerated region on 01/26/2019 which was consistent with a squamous cell carcinoma of all the biopsied sites.  She denies any fevers, night sweats or weight loss in the last 6 months.  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  She used to work as a Medical illustrator.  Currently she lives by herself with 2 dogs.  She has a fianc with whom we communicated over the phone.  She was a never smoker.  Family history significant for one maternal niece who died of breast cancer.  Another maternal niece had mastectomies for breast cancer.  Brother had anal cancer.  Patient is able to do all her day-to-day activities without any problems.  She has diabetes and peripheral neuropathy in the feet and as a result of it.  Appetite and energy levels are 100%.  MEDICAL HISTORY:  Past Medical History:  Diagnosis Date  . 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 Cervical cancer ([place])   . Depression   . Diabetes mellitus without complication ([place])   . History of cervical cancer   . Hypertension     SURGICAL HISTORY: Past Surgical History:  Procedure Laterality Date  . CATARACT EXTRACTION, BILATERAL    . CERVICAL CONE BIOPSY    . HAND AMPUTATION Left   . RECTAL BIOPSY  01/26/2019   Procedure: BIOPSY OF ANAL MASS;  Surgeon: Virl Cagey, MD;  Location: [facility];  Service: General;;  . RECTAL EXAM UNDER ANESTHESIA N/A 01/26/2019   Procedure: RECTAL EXAM UNDER ANESTHESIA;  Surgeon: Virl Cagey, MD;  Location: [facility];  Service: General;  Laterality: N/A;    SOCIAL HISTORY: Social History   Socioeconomic History  . Marital status: Single    Spouse name: Not on file  . Number of children: 2  . Years of education: Not on file  . Highest education level: Not on file  Occupational History  . Occupation: retired  Scientific laboratory technician  . Financial resource strain: Somewhat hard  . Food insecurity    Worry: Never true    Inability: Never true  . Transportation needs    Medical: No    Non-medical: No  Tobacco Use  . Smoking status: Never Smoker  . Smokeless tobacco: Never Used  Substance and Sexual Activity  . Alcohol use: Not Currently    Frequency: Never  . Drug use: Never  . Sexual activity: Not on file  Lifestyle  . Physical activity    Days per week: 7 days    Minutes per session: 60 min  . Stress: To some extent  Relationships  . Social Herbalist on phone: Three times a week    Gets together: Three times a week    Attends religious service: 1  to 4 times per year    Active member of club or organization: Yes    Attends meetings of clubs or organizations: 1 to 4 times per year    Relationship status: Separated  . Intimate partner violence    Fear of current or ex partner: No    Emotionally abused: No    Physically abused: No    Forced sexual activity: No  Other Topics Concern  . Not on file  Social History Narrative   Pt is retired and currently engaged.    FAMILY HISTORY: Family History  Problem Relation Age of Onset  . Cataracts Mother   . Glaucoma Mother   . Heart disease Mother   . Diabetes Father   . Heart attack Father   . Cataracts Sister   . Lung disease Sister   . Cataracts Brother   . Diabetes Brother   . Heart  disease Brother   . Macular degeneration Maternal Aunt   . Diabetes Maternal Grandfather   . Heart disease Sister   . Diabetes Sister   . Throat cancer Son   . Amblyopia Neg Hx   . Blindness Neg Hx   . Retinal detachment Neg Hx   . Strabismus Neg Hx   . Retinitis pigmentosa Neg Hx     ALLERGIES:  is allergic to sulfa antibiotics and sulfamethoxazole.  MEDICATIONS:  Current Outpatient Medications  Medication Sig Dispense Refill  . acetaminophen (TYLENOL) 500 MG tablet Take 1,000 mg by mouth every 8 (eight) hours as needed (pain).    Marland Kitchen docusate sodium (COLACE) 100 MG capsule Take 1 capsule (100 mg total) by mouth 2 (two) times daily. (Patient taking differently: Take 100 mg by mouth daily. Equate Stool Softner) 60 capsule 2  . gabapentin (NEURONTIN) 100 MG capsule Take 100 mg by mouth 3 (three) times daily.    . INVOKANA 300 MG TABS tablet Take 300 mg by mouth daily.    . Lidocaine, Anorectal, 5 % GEL Apply 1 application topically 4 (four) times daily as needed. 30 g 2  . lisinopril (ZESTRIL) 20 MG tablet Take 20 mg by mouth daily.    . nabumetone (RELAFEN) 750 MG tablet Take 750 mg by mouth 2 (two) times daily.    Marland Kitchen oxyCODONE (ROXICODONE) 5 MG immediate release tablet Take 1-2 tablets (5-10 mg total) by mouth every 4 (four) hours as needed. (Patient taking differently: Take 5-10 mg by mouth every 4 (four) hours as needed for severe pain. ) 30 tablet 0  . silver sulfADIAZINE (SILVADENE) 1 % cream Apply to affected area daily (Patient taking differently: Apply 1 application topically daily as needed (burns). ) 50 g 1  . simvastatin (ZOCOR) 40 MG tablet Take 40 mg by mouth daily.     . traZODone (DESYREL) 50 MG tablet Take 50 mg by mouth at bedtime.    Tyler Aas FLEXTOUCH 200 UNIT/ML SOPN Inject 76 Units into the skin every morning.     . TRINTELLIX 10 MG TABS tablet Take 10 mg by mouth daily.    . TRULICITY [id number] [id number] SOPN Inject 0.75 mg into the muscle every Sunday.      No current  facility-administered medications for this visit.     REVIEW OF SYSTEMS:   Constitutional: Denies fevers, chills or abnormal night sweats Eyes: Denies blurriness of vision, double vision or watery eyes Ears, nose, mouth, throat, and face: Denies mucositis or sore throat Respiratory: Denies cough, dyspnea or wheezes Cardiovascular: Denies palpitation, chest discomfort or  lower extremity swelling Gastrointestinal:  Denies nausea, heartburn or change in bowel habits Skin: Denies abnormal skin rashes Lymphatics: Denies new lymphadenopathy or easy bruising Neurological:Denies numbness, tingling or new weaknesses Behavioral/Psych: Mood is stable, no new changes  All other systems were reviewed with the patient and are negative.  PHYSICAL EXAMINATION: ECOG PERFORMANCE STATUS: 1 - Symptomatic but completely ambulatory  Vitals:   02/07/19 1300  BP: (!) 145/55  Pulse: 70  Resp: 16  Temp: 98.1 F (36.7 C)  SpO2: 100%   Filed Weights   02/07/19 1300  Weight: 210 lb 7 oz (95.5 kg)    GENERAL:alert, no distress and comfortable SKIN: skin color, texture, turgor are normal, no rashes or significant lesions EYES: normal, conjunctiva are pink and non-injected, sclera clear OROPHARYNX:no exudate, no erythema and lips, buccal mucosa, and tongue normal  NECK: supple, thyroid normal size, non-tender, without nodularity LYMPH:  no palpable lymphadenopathy in the cervical, axillary or inguinal LUNGS: clear to auscultation and percussion with normal breathing effort HEART: regular rate & rhythm and no murmurs and no lower extremity edema ABDOMEN:abdomen soft, non-tender and normal bowel sounds Musculoskeletal:no cyanosis of digits and no clubbing  PSYCH: alert & oriented x 3 with fluent speech NEURO: no focal motor/sensory deficits Perianal ulceration, right side more than left side present.  Anal mass and the anterior anal canal, towards the right side.  LABORATORY DATA:  I have reviewed the  data as listed Lab Results  Component Value Date   WBC 7.2 01/25/2019   HGB 13.5 01/25/2019   HCT 42.5 01/25/2019   MCV 92.6 01/25/2019   PLT 235 01/25/2019     Chemistry      Component Value Date/Time   NA 137 01/25/2019 0822   K 4.2 01/25/2019 0822   CL 104 01/25/2019 0822   CO2 25 01/25/2019 0822   BUN 36 (H) 01/25/2019 0822   CREATININE 0.92 01/25/2019 0822      Component Value Date/Time   CALCIUM 9.0 01/25/2019 0822       RADIOGRAPHIC STUDIES: I have personally reviewed the radiological images as listed and agreed with the findings in the report.  ASSESSMENT & PLAN:  Anal cancer ([place]) 1.  Squamous cell carcinoma of the anus: - She reported feeling raw in the perianal area since March.  Because of COVID-19, she did not seek medical attention.  She used OTC creams like Monistat without improvement. -She was evaluated by Dr. Constance Haw who did a biopsy of the internal anal mass and perianal ulceration on 01/26/2019. - Biopsy was consistent with squamous cell carcinoma in the perianal region as well as the anal canal lesion. -Physical examination today did not reveal any palpable inguinal adenopathy.  Perianal exam shows ulcerated lesion around the anal modifies, predominantly on the right buttock cheek and left buttock cheek (right more than left).  There is a mass in the lower part of anal canal towards the right side. - Had prolonged discussion about the biopsy report.  She is having a port placed tomorrow. - I have recommended doing a PET CT scan for staging purposes.  We will also obtain P 16 staining on the biopsy. -We will do HIV testing. -We will see her back after the PET CT scan to discuss the stage and treatment plan.  2.  Family history: - 2 maternal nieces had breast cancer.  1 of them died from it. -Brother had anal cancer.  3.  Diabetes: -She is on Tresiba 76 units in the morning  and Invokana 300 mg daily. -She does report some numbness in the feet from  neuropathy from diabetes.  Orders Placed This Encounter  Procedures  . NM PET Image Initial (PI) Skull Base To Thigh    Standing Status:   Future    Standing Expiration Date:   02/07/2020    Order Specific Question:   ** REASON FOR EXAM (FREE TEXT)    Answer:   anal cancer    Order Specific Question:   If indicated for the ordered procedure, I authorize the administration of a radiopharmaceutical per Radiology protocol    Answer:   Yes    Order Specific Question:   Preferred imaging location?    Answer:   [facility]    Order Specific Question:   Radiology Contrast Protocol - do NOT remove file path    Answer:   \\[email or web address]  . HIV antibody (with reflex)    Standing Status:   Future    Standing Expiration Date:   02/07/2020    All questions were answered. The patient knows to call the clinic with any problems, questions or concerns.      Derek Jack, MD 02/07/2019 1:55 PM

## 2019-02-07 NOTE — Assessment & Plan Note (Signed)
1.  Squamous cell carcinoma of the anus: - She reported feeling raw in the perianal area since March.  Because of COVID-19, she did not seek medical attention.  She used OTC creams like Monistat without improvement. -She was evaluated by Dr. Constance Haw who did a biopsy of the internal anal mass and perianal ulceration on 01/26/2019. - Biopsy was consistent with squamous cell carcinoma in the perianal region as well as the anal canal lesion. -Physical examination today did not reveal any palpable inguinal adenopathy.  Perianal exam shows ulcerated lesion around the anal modifies, predominantly on the right buttock cheek and left buttock cheek (right more than left).  There is a mass in the lower part of anal canal towards the right side. - Had prolonged discussion about the biopsy report.  She is having a port placed tomorrow. - I have recommended doing a PET CT scan for staging purposes.  We will also obtain P 16 staining on the biopsy. -We will do HIV testing. -We will see her back after the PET CT scan to discuss the stage and treatment plan.  2.  Family history: - 2 maternal nieces had breast cancer.  1 of them died from it. -Brother had anal cancer.  3.  Diabetes: -She is on Tresiba 76 units in the morning and Invokana 300 mg daily. 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

## 2019-02-07 NOTE — Patient Instructions (Signed)
[place] at [facility] Discharge Instructions  You were seen today by Dr. Delton Coombes. He went over your history, family history and how you've been feeling lately. He discussed your recent test results. 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 He will schedule you for scans to further assess your cancer. He will get blood drawn today. He will see you back after your scan for follow up.   Thank you for choosing [place] at [facility] to provide your oncology and hematology care.  To afford each patient quality time with our provider, please arrive at least 15 minutes before your scheduled appointment time.   If you have a lab appointment with the [facility] please come in thru the  Main Entrance and check in at the main information desk  You need to re-schedule your appointment should you arrive 10 or more minutes late.  We strive to give you quality time with our providers, and arriving late affects you and other patients whose appointments are after yours.  Also, if you no show three or more times for appointments you may be dismissed from the clinic at the providers discretion.     Again, thank you for choosing [facility].  Our hope is that these requests will decrease the amount of time that you wait before being seen by our physicians.       _____________________________________________________________  Should you have questions after your visit to [facility], please contact our office at [phone] between the hours of 8:00 a.m. and 4:30 p.m.  Voicemails left after 4:00 p.m. will not be returned until the following business day.  For prescription refill requests, have your pharmacy contact our office and allow 72 hours.    Cancer Center Support Programs:   > Cancer Support Group  2nd Tuesday of the month 1pm-2pm, Journey Room

## 2019-02-08 ENCOUNTER — Ambulatory Visit (HOSPITAL_COMMUNITY): Payer: Medicare HMO

## 2019-02-08 ENCOUNTER — Other Ambulatory Visit: Payer: Self-pay

## 2019-02-08 ENCOUNTER — Ambulatory Visit (HOSPITAL_COMMUNITY)
Admission: RE | Admit: 2019-02-08 | Discharge: 2019-02-08 | Disposition: A | Discharge status: Home / Self Care | Payer: Medicare HMO | Attending: General Surgery | Admitting: General Surgery

## 2019-02-08 ENCOUNTER — Ambulatory Visit (HOSPITAL_COMMUNITY): Payer: Medicare HMO | Admitting: Anesthesiology

## 2019-02-08 ENCOUNTER — Encounter (HOSPITAL_COMMUNITY)
Admission: RE | Disposition: A | Discharge status: Home / Self Care | Payer: Self-pay | Source: Home / Self Care | Attending: General Surgery

## 2019-02-08 ENCOUNTER — Encounter (HOSPITAL_COMMUNITY): Payer: Self-pay

## 2019-02-08 DIAGNOSIS — F329 Major depressive disorder, single episode, unspecified: Secondary | ICD-10-CM | POA: Diagnosis not present

## 2019-02-08 DIAGNOSIS — I10 Essential (primary) hypertension: Secondary | ICD-10-CM | POA: Diagnosis not present

## 2019-02-08 DIAGNOSIS — Z794 Long term (current) use of insulin: Secondary | ICD-10-CM | POA: Insufficient documentation

## 2019-02-08 DIAGNOSIS — C210 Malignant neoplasm of anus, unspecified: Secondary | ICD-10-CM | POA: Diagnosis present

## 2019-02-08 DIAGNOSIS — Z8541 Personal history of malignant neoplasm of cervix uteri: Secondary | ICD-10-CM | POA: Diagnosis not present

## 2019-02-08 DIAGNOSIS — Z95828 Presence of other vascular implants and grafts: Secondary | ICD-10-CM

## 2019-02-08 DIAGNOSIS — Z882 Allergy status to sulfonamides status: Secondary | ICD-10-CM | POA: Diagnosis not present

## 2019-02-08 DIAGNOSIS — Z79899 Other long term (current) drug therapy: Secondary | ICD-10-CM | POA: Diagnosis not present

## 2019-02-08 DIAGNOSIS — E109 Type 1 diabetes mellitus without complications: Secondary | ICD-10-CM | POA: Diagnosis not present

## 2019-02-08 HISTORY — PX: PORTACATH PLACEMENT: SHX2246

## 2019-02-08 LAB — GLUCOSE, CAPILLARY
Glucose-Capillary: 94 mg/dL (ref 70–99)
Glucose-Capillary: 111 mg/dL — ABNORMAL HIGH (ref 70–99)

## 2019-02-08 SURGERY — INSERTION, TUNNELED CENTRAL VENOUS DEVICE, WITH PORT
Anesthesia: Monitor Anesthesia Care | Site: Chest | Laterality: Left

## 2019-02-08 MED ORDER — HEPARIN SOD (PORK) LOCK FLUSH 100 UNIT/ML IV SOLN
INTRAVENOUS | Status: AC
  Filled 2019-02-08: qty 5

## 2019-02-08 MED ORDER — FENTANYL CITRATE (PF) 100 MCG/2ML IJ SOLN
INTRAMUSCULAR | Status: AC
  Filled 2019-02-08: qty 2

## 2019-02-08 MED ORDER — HYDROMORPHONE HCL 1 MG/ML IJ SOLN: 0.2500 mg | INTRAMUSCULAR | Status: DC | PRN

## 2019-02-08 MED ORDER — MIDAZOLAM HCL 2 MG/2ML IJ SOLN
INTRAMUSCULAR | Status: AC
  Filled 2019-02-08: qty 2

## 2019-02-08 MED ORDER — HEPARIN SOD (PORK) LOCK FLUSH 100 UNIT/ML IV SOLN
INTRAVENOUS | Status: DC | PRN
  Administered 2019-02-08: 500 [IU] via INTRAVENOUS

## 2019-02-08 MED ORDER — HYDROCODONE-ACETAMINOPHEN 7.5-325 MG PO TABS: 1.0000 | ORAL_TABLET | Freq: Once | ORAL | Status: DC | PRN

## 2019-02-08 MED ORDER — PROPOFOL 10 MG/ML IV BOLUS
INTRAVENOUS | Status: AC
  Filled 2019-02-08: qty 20

## 2019-02-08 MED ORDER — PROMETHAZINE HCL 25 MG/ML IJ SOLN: 6.2500 mg | INTRAMUSCULAR | Status: DC | PRN

## 2019-02-08 MED ORDER — LIDOCAINE HCL (PF) 1 % IJ SOLN
INTRAMUSCULAR | Status: AC
  Filled 2019-02-08: qty 30

## 2019-02-08 MED ORDER — SUCCINYLCHOLINE CHLORIDE 200 MG/10ML IV SOSY
PREFILLED_SYRINGE | INTRAVENOUS | Status: AC
  Filled 2019-02-08: qty 10

## 2019-02-08 MED ORDER — CEFAZOLIN SODIUM-DEXTROSE 2-4 GM/100ML-% IV SOLN
2.0000 g | INTRAVENOUS | Status: AC
  Administered 2019-02-08: 2 g via INTRAVENOUS
  Filled 2019-02-08: qty 100

## 2019-02-08 MED ORDER — MIDAZOLAM HCL 2 MG/2ML IJ SOLN: 0.5000 mg | Freq: Once | INTRAMUSCULAR | Status: DC | PRN

## 2019-02-08 MED ORDER — PROPOFOL 500 MG/50ML IV EMUL
INTRAVENOUS | Status: DC | PRN
  Administered 2019-02-08: 75 ug/kg/min via INTRAVENOUS

## 2019-02-08 MED ORDER — SODIUM CHLORIDE (PF) 0.9 % IJ SOLN
INTRAMUSCULAR | Status: DC | PRN
  Administered 2019-02-08: 10 mL via INTRAVENOUS

## 2019-02-08 MED ORDER — PROPOFOL 10 MG/ML IV BOLUS
INTRAVENOUS | Status: DC | PRN
  Administered 2019-02-08 (×2): 20 mg via INTRAVENOUS

## 2019-02-08 MED ORDER — LIDOCAINE HCL (PF) 1 % IJ SOLN
INTRAMUSCULAR | Status: DC | PRN
  Administered 2019-02-08: 10 mL

## 2019-02-08 MED ORDER — MIDAZOLAM HCL 5 MG/5ML IJ SOLN
INTRAMUSCULAR | Status: DC | PRN
  Administered 2019-02-08 (×2): 1 mg via INTRAVENOUS

## 2019-02-08 MED ORDER — LACTATED RINGERS IV SOLN
INTRAVENOUS | Status: DC
  Administered 2019-02-08: 08:00:00 via INTRAVENOUS

## 2019-02-08 MED ORDER — CHLORHEXIDINE GLUCONATE CLOTH 2 % EX PADS: 6.0000 | MEDICATED_PAD | Freq: Once | CUTANEOUS | Status: DC

## 2019-02-08 SURGICAL SUPPLY — 36 items
APPLIER CLIP 9.375 SM OPEN (CLIP)
BAG DECANTER FOR FLEXI CONT (MISCELLANEOUS) ×3 IMPLANT
CHLORAPREP W/TINT 10.5 ML (MISCELLANEOUS) ×3 IMPLANT
CLIP APPLIE 9.375 SM OPEN (CLIP) IMPLANT
CLOTH BEACON ORANGE TIMEOUT ST (SAFETY) ×3 IMPLANT
COVER LIGHT HANDLE STERIS (MISCELLANEOUS) ×6 IMPLANT
COVER WAND RF STERILE (DRAPES) ×3 IMPLANT
DECANTER SPIKE VIAL GLASS SM (MISCELLANEOUS) ×3 IMPLANT
DERMABOND ADVANCED (GAUZE/BANDAGES/DRESSINGS) ×2
DERMABOND ADVANCED .7 DNX12 (GAUZE/BANDAGES/DRESSINGS) ×1 IMPLANT
DRAPE C-ARM FOLDED MOBILE STRL (DRAPES) ×3 IMPLANT
ELECT REM PT RETURN 9FT ADLT (ELECTROSURGICAL) ×3
ELECTRODE REM PT RTRN 9FT ADLT (ELECTROSURGICAL) ×1 IMPLANT
GLOVE BIO SURGEON STRL SZ 6.5 (GLOVE) ×2 IMPLANT
GLOVE BIO SURGEONS STRL SZ 6.5 (GLOVE) ×1
GLOVE BIOGEL PI IND STRL 6.5 (GLOVE) ×1 IMPLANT
GLOVE BIOGEL PI IND STRL 7.0 (GLOVE) ×2 IMPLANT
GLOVE BIOGEL PI INDICATOR 6.5 (GLOVE) ×2
GLOVE BIOGEL PI INDICATOR 7.0 (GLOVE) ×4
GLOVE ECLIPSE 6.5 STRL STRAW (GLOVE) ×3 IMPLANT
GOWN STRL REUS W/TWL LRG LVL3 (GOWN DISPOSABLE) ×6 IMPLANT
IV NS 500ML (IV SOLUTION) ×2
IV NS 500ML BAXH (IV SOLUTION) ×1 IMPLANT
KIT PORT POWER 8FR ISP MRI (Port) ×3 IMPLANT
KIT TURNOVER KIT A (KITS) ×3 IMPLANT
MANIFOLD NEPTUNE II (INSTRUMENTS) ×3 IMPLANT
NEEDLE HYPO 25X1 1.5 SAFETY (NEEDLE) ×3 IMPLANT
PACK MINOR (CUSTOM PROCEDURE TRAY) ×3 IMPLANT
PAD ARMBOARD 7.5X6 YLW CONV (MISCELLANEOUS) ×3 IMPLANT
SET BASIN LINEN APH (SET/KITS/TRAYS/PACK) ×3 IMPLANT
SUT MNCRL AB 4-0 PS2 18 (SUTURE) ×3 IMPLANT
SUT PROLENE 2 0 SH 30 (SUTURE) ×3 IMPLANT
SUT VIC AB 3-0 SH 27 (SUTURE) ×2
SUT VIC AB 3-0 SH 27X BRD (SUTURE) ×1 IMPLANT
SYR 10ML LL (SYRINGE) ×6 IMPLANT
SYR CONTROL 10ML LL (SYRINGE) ×3 IMPLANT

## 2019-02-08 NOTE — Progress Notes (Signed)
[facility] in place and no Ptx.  Curlene Labrum, MD [facility] [address] [place], [place] [address] [phone] (office)

## 2019-02-08 NOTE — Anesthesia Postprocedure Evaluation (Signed)
Anesthesia Post Note  Patient: Jacqueline Bird  Procedure(s) Performed: INSERTION PORT-A-CATH (Left Chest)  Anesthesia Type: MAC Level of consciousness: awake and alert and awake Pain management: pain level controlled Cardiovascular status: blood pressure returned to baseline and stable Postop Assessment: no apparent nausea or vomiting Anesthetic complications: no     Last Vitals:  Vitals:   02/08/19 1000 02/08/19 1012  BP: (!) 116/56 (!) 128/44  Pulse: 73 72  Resp: 17 18  Temp:  36.6 C  SpO2: 99% 100%    Last Pain:  Vitals:   02/08/19 1012  TempSrc: Axillary  PainSc: 0-No pain                 Zineb Glade

## 2019-02-08 NOTE — Op Note (Signed)
Operative Note 02/08/19   Preoperative Diagnosis:  Anal cancer   Postoperative Diagnosis: Same   Procedure(s) Performed: Port-A-Cath placement, left subclavian   Surgeon: Lanell Matar. Constance Haw, MD   Assistants: No qualified resident was available   Anesthesia: Monitored anesthesia care   Anesthesiologist: Dr. Hilaria Ota   Specimens: None   Estimated Blood Loss: Minimal   Fluoroscopy time: 18 seconds   Blood Replacement: None    Complications: None    Operative Findings: Normal anatomy  Indications: Jacqueline Bird is a 68 yo with newly diagnosed anal cancer that will need to undergo treatment. We discussed the risk of port including bleeding, pneumothorax, malfunction, and infection, and she has opted to proceed. She will be getting her PET scan next week and hopefully starting treatment soon after.   Procedure: The patient was brought into the operating room and monitored anesthesia care was induced.  One percent lidocaine was used for local anesthesia.   The left chest and neck was prepped and draped in the usual sterile fashion.  Preoperative antibiotics were given.  An incision was made below the left clavicle. A subcutaneous pocket was formed. The needles advanced into the left subclavian vein using the Seldinger technique without difficulty. A guidewire was then advanced into the right atrium under fluoroscopic guidance.  Ectopia was not noted. An introducer and peel-away sheath were placed over the guidewire. The catheter was then inserted through the peel-away sheath and the peel-away sheath was removed.  A spot film was performed to confirm the position. The catheter was then attached to the port and the port placed in subcutaneous pocket. Adequate positioning was confirmed by fluoroscopy. Hemostasis was confirmed, and the port was secured with 2-0 prolene sutures.  Good backflow of blood was noted on aspiration of the port. The port was flushed with heparin flush. Subcutaneous layer  was reapproximated using a 3-0 Vicryl interrupted suture. The skin was closed using a 4-0 Vicryl subcuticular suture. Dermabond was applied.  All tape and needle counts were correct at the end of the procedure. The patient was transferred to PACU in stable condition. A chest x-ray will be performed at that time.  Jacqueline Labrum, MD [facility] [address] [place], [place] [address] [phone] (office)

## 2019-02-08 NOTE — Transfer of Care (Signed)
Immediate Anesthesia Transfer of Care Note  Patient: Jacqueline Bird  Procedure(s) Performed: INSERTION PORT-A-CATH (Left Chest)  Patient Location: PACU  Anesthesia Type:MAC  Level of Consciousness: awake  Airway & Oxygen Therapy: Patient Spontanous Breathing  Post-op Assessment: Report given to RN  Post vital signs: Reviewed and stable  Last Vitals:  Vitals Value Taken Time  BP    Temp    Pulse 81 02/08/19 0924  Resp    SpO2 99 % 02/08/19 0924  Vitals shown include unvalidated device data.  Last Pain:  Vitals:   02/08/19 0748  TempSrc: Oral  PainSc: 0-No pain      Patients Stated Pain Goal: 6 (73/53/29 9242)  Complications: No apparent anesthesia complications

## 2019-02-08 NOTE — H&P (Signed)
[facility] History and Physical  Jahleah Devany is a 68 y.o. female.  HPI: Ms. Devereux is a 68 yo a newly diagnosed anal cancer after biopsy a few weeks back. She is going to undergo treatment for this cancer, and saw Dr. Delton Coombes yesterday. She will need a port for her treatment.  She has been doing well otherwise and says her pain is about the same in her anal area. She has a history of traumatic amputation of the left hand, so we will go on the left for the port.   Past Medical History:  Diagnosis Date  . 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 Cervical cancer ([place])   . Depression   . Diabetes mellitus without complication ([place])   . History of cervical cancer   . Hypertension     Past Surgical History:  Procedure Laterality Date  . CATARACT EXTRACTION, BILATERAL    . CERVICAL CONE BIOPSY    . HAND AMPUTATION Left   . RECTAL BIOPSY  01/26/2019   Procedure: BIOPSY OF ANAL MASS;  Surgeon: Virl Cagey, MD;  Location: [facility];  Service: General;;  . RECTAL EXAM UNDER ANESTHESIA N/A 01/26/2019   Procedure: RECTAL EXAM UNDER ANESTHESIA;  Surgeon: Virl Cagey, MD;  Location: [facility];  Service: General;  Laterality: N/A;    Family History  Problem Relation Age of Onset  . Cataracts Mother   . Glaucoma Mother   . Heart disease Mother   . Diabetes Father   . Heart attack Father   . Cataracts Sister   . Lung disease Sister   . Cataracts Brother   . Diabetes Brother   . Heart disease Brother   . Macular degeneration Maternal Aunt   . Diabetes Maternal Grandfather   . Heart disease Sister   . Diabetes Sister   . Throat cancer Son   . Amblyopia Neg Hx   . Blindness Neg Hx   . Retinal detachment Neg Hx   . Strabismus Neg Hx   . Retinitis pigmentosa Neg Hx     Social History   Tobacco Use  . Smoking status: Never Smoker  . Smokeless tobacco: Never Used  Substance Use Topics  . Alcohol use: Not Currently    Frequency: Never  . Drug use:  Never    Medications: I have reviewed the patient's current medications.  Current Facility-Administered Medications  Medication Dose Route Frequency Provider Last Rate Last Dose  . ceFAZolin (ANCEF) IVPB 2g/100 mL premix  2 g Intravenous On Call to OR Virl Cagey, MD      . Chlorhexidine Gluconate Cloth 2 % PADS 6 each  6 each Topical Once Virl Cagey, MD       And  . Chlorhexidine Gluconate Cloth 2 % PADS 6 each  6 each Topical Once Virl Cagey, MD      . lactated ringers infusion   Intravenous Continuous Hilaria Ota Talbert Forest, MD       Medications Prior to Admission  Medication Sig Dispense Refill  . acetaminophen (TYLENOL) 500 MG tablet Take 1,000 mg by mouth every 8 (eight) hours as needed (pain).    Marland Kitchen docusate sodium (COLACE) 100 MG capsule Take 1 capsule (100 mg total) by mouth 2 (two) times daily. (Patient taking differently: Take 100 mg by mouth daily. Equate Stool Softner) 60 capsule 2  . gabapentin (NEURONTIN) 100 MG capsule Take 100 mg by mouth 3 (three) times daily.    Marland Kitchen  INVOKANA 300 MG TABS tablet Take 300 mg by mouth daily.    . Lidocaine, Anorectal, 5 % GEL Apply 1 application topically 4 (four) times daily as needed. 30 g 2  . lisinopril (ZESTRIL) 20 MG tablet Take 20 mg by mouth daily.    . nabumetone (RELAFEN) 750 MG tablet Take 750 mg by mouth 2 (two) times daily.    Marland Kitchen oxyCODONE (ROXICODONE) 5 MG immediate release tablet Take 1-2 tablets (5-10 mg total) by mouth every 4 (four) hours as needed. (Patient taking differently: Take 5-10 mg by mouth every 4 (four) hours as needed for severe pain. ) 30 tablet 0  . silver sulfADIAZINE (SILVADENE) 1 % cream Apply to affected area daily (Patient taking differently: Apply 1 application topically daily as needed (burns). ) 50 g 1  . simvastatin (ZOCOR) 40 MG tablet Take 40 mg by mouth daily.     . traZODone (DESYREL) 50 MG tablet Take 50 mg by mouth at bedtime.    Tyler Aas FLEXTOUCH 200 UNIT/ML SOPN Inject 76 Units  into the skin every morning.     . TRINTELLIX 10 MG TABS tablet Take 10 mg by mouth daily.    . TRULICITY [id number] 0000000 SOPN Inject 0.75 mg into the muscle every Sunday.      Allergies  Allergen Reactions  . Sulfa Antibiotics Anaphylaxis    Per pt, can use creams with sulfa in it  . Sulfamethoxazole Other (See Comments)    Tongue swelling    ROS:  A comprehensive review of systems was negative except for: Gastrointestinal: positive for continued burning pain around anal region  Blood pressure (!) 136/57, pulse 74, temperature 98.1 F (36.7 C), temperature source Oral, resp. rate 18, height 5\' 4"  (1.626 m), weight 95.5 kg, SpO2 100 %. Physical Exam Vitals signs reviewed.  Constitutional:      Appearance: Normal appearance.  HENT:     Head: Normocephalic.     Nose: Nose normal.     Mouth/Throat:     Mouth: Mucous membranes are moist.  Eyes:     Pupils: Pupils are equal, round, and reactive to light.  Neck:     Musculoskeletal: Normal range of motion. No neck rigidity.  Cardiovascular:     Rate and Rhythm: Normal rate.  Pulmonary:     Effort: Pulmonary effort is normal.  Musculoskeletal:        General: No swelling.     Comments: Left hand amputated  Skin:    General: Skin is warm and dry.  Neurological:     General: No focal deficit present.     Mental Status: She is alert and oriented to person, place, and time.  Psychiatric:        Mood and Affect: Mood normal.        Behavior: Behavior normal.        Thought Content: Thought content normal.        Judgment: Judgment normal.     Results: Results for orders placed or performed during the hospital encounter of 02/08/19 (from the past 48 hour(s))  Glucose, capillary     Status: None   Collection Time: 02/08/19  7:54 AM  Result Value Ref Range   Glucose-Capillary 94 70 - 99 mg/dL    No results found.   Assessment & Plan:  Trischa Chaban is a 68 y.o. female with anal cancer who will need a port a catheter for  treatment. She gets her PET scan on Monday.  -OR for  port placement  -We discussed the risk of bleeding, infection, malfunction, pneumothorax, and she opted to proceed.   All questions were answered to the satisfaction of the patient.   Virl Cagey 02/08/2019, 8:10 AM

## 2019-02-08 NOTE — Discharge Instructions (Signed)
Keep area clean and dry. You can take a shower in 24 hours. Do not submerge in water 4-6 weeks.  Take tylenol and ibuprofen for pain control and Roxicodone for severe pain Jacqueline Bird says you have have about 5 left in the bottle).    Implanted [facility] Guide An implanted port is a device that is placed under the skin. It is usually placed in the chest. The device can be used to give IV medicine, to take blood, or for dialysis. You may have an implanted port if:  You need IV medicine that would be irritating to the small veins in your hands or arms.  You need IV medicines, such as antibiotics, for a long period of time.  You need IV nutrition for a long period of time.  You need dialysis. Having a port means that your health care provider will not need to use the veins in your arms for these procedures. You may have fewer limitations when using a port than you would if you used other types of long-term IVs, and you will likely be able to return to normal activities after your incision heals. An implanted port has two main parts:  Reservoir. The reservoir is the part where a needle is inserted to give medicines or draw blood. The reservoir is round. After it is placed, it appears as a small, raised area under your skin.  Catheter. The catheter is a thin, flexible tube that connects the reservoir to a vein. Medicine that is inserted into the reservoir goes into the catheter and then into the vein. How is my port accessed? To access your port:  A numbing cream may be placed on the skin over the port site.  Your health care provider will put on a mask and sterile gloves.  The skin over your port will be cleaned carefully with a germ-killing soap and allowed to dry.  Your health care provider will gently pinch the port and insert a needle into it.  Your health care provider will check for a blood return to make sure the port is in the vein and is not clogged.  If your port needs to remain  accessed to get medicine continuously (constant infusion), your health care provider will place a clear bandage (dressing) over the needle site. The dressing and needle will need to be changed every week, or as told by your health care provider. What is flushing? Flushing helps keep the port from getting clogged. Follow instructions from your health care provider about how and when to flush the port. Ports are usually flushed with saline solution or a medicine called heparin. The need for flushing will depend on how the port is used:  If the port is only used from time to time to give medicines or draw blood, the port may need to be flushed: ? Before and after medicines have been given. ? Before and after blood has been drawn. ? As part of routine maintenance. Flushing may be recommended every 4-6 weeks.  If a constant infusion is running, the port may not need to be flushed.  Throw away any syringes in a disposal container that is meant for sharp items (sharps container). You can buy a sharps container from a pharmacy, or you can make one by using an empty hard plastic bottle with a cover. How long will my port stay implanted? The port can stay in for as long as your health care provider thinks it is needed. When it is time  for the port to come out, a surgery will be done to remove it. The surgery will be similar to the procedure that was done to put the port in. Follow these instructions at home:   Flush your port as told by your health care provider.  If you need an infusion over several days, follow instructions from your health care provider about how to take care of your port site. Make sure you: ? Wash your hands with soap and water before you change your dressing. If soap and water are not available, use alcohol-based hand sanitizer. ? Change your dressing as told by your health care provider. ? Place any used dressings or infusion bags into a plastic bag. Throw that bag in the  trash. ? Keep the dressing that covers the needle clean and dry. Do not get it wet. ? Do not use scissors or sharp objects near the tube. ? Keep the tube clamped, unless it is being used.  Check your port site every day for signs of infection. Check for: ? Redness, swelling, or pain. ? Fluid or blood. ? Pus or a bad smell.  Protect the skin around the port site. ? Avoid wearing bra straps that rub or irritate the site. ? Protect the skin around your port from seat belts. Place a soft pad over your chest if needed.  Bathe or shower as told by your health care provider. The site may get wet as long as you are not actively receiving an infusion.  Return to your normal activities as told by your health care provider. Ask your health care provider what activities are safe for you.  Carry a medical alert card or wear a medical alert bracelet at all times. This will let health care providers know that you have an implanted port in case of an emergency. Get help right away if:  You have redness, swelling, or pain at the port site.  You have fluid or blood coming from your port site.  You have pus or a bad smell coming from the port site.  You have a fever. Summary  Implanted ports are usually placed in the chest for long-term IV access.  Follow instructions from your health care provider about flushing the port and changing bandages (dressings).  Take care of the area around your port by avoiding clothing that puts pressure on the area, and by watching for signs of infection.  Protect the skin around your port from seat belts. Place a soft pad over your chest if needed.  Get help right away if you have a fever or you have redness, swelling, pain, drainage, or a bad smell at the port site. This information is not intended to replace advice given to you by your health care provider. Make sure you discuss any questions you have with your health care provider. Document Released:  05/04/2005 Document Revised: 08/26/2018 Document Reviewed: 06/06/2016 Elsevier Patient Education  2020 [employer] After These instructions provide you with information about caring for yourself after your procedure. Your health care provider may also give you more specific instructions. Your treatment has been planned according to current medical practices, but problems sometimes occur. Call your health care provider if you have any problems or questions after your procedure. What can I expect after the procedure? After your procedure, you may:  Feel sleepy for several hours.  Feel clumsy and have poor balance for several hours.  Feel forgetful about what happened after the  procedure.  Have poor judgment for several hours.  Feel nauseous or vomit.  Have a sore throat if you had a breathing tube during the procedure. Follow these instructions at home: For at least 24 hours after the procedure:      Have a responsible adult stay with you. It is important to have someone help care for you until you are awake and alert.  Rest as needed.  Do not: ? Participate in activities in which you could fall or become injured. ? Drive. ? Use heavy machinery. ? Drink alcohol. ? Take sleeping pills or medicines that cause drowsiness. ? Make important decisions or sign legal documents. ? Take care of children on your own. Eating and drinking  Follow the diet that is recommended by your health care provider.  If you vomit, drink water, juice, or soup when you can drink without vomiting.  Make sure you have little or no nausea before eating solid foods. General instructions  Take over-the-counter and prescription medicines only as told by your health care provider.  If you have sleep apnea, surgery and certain medicines can increase your risk for breathing problems. Follow instructions from your health care provider about wearing your sleep  device: ? Anytime you are sleeping, including during daytime naps. ? While taking prescription pain medicines, sleeping medicines, or medicines that make you drowsy.  If you smoke, do not smoke without supervision.  Keep all follow-up visits as told by your health care provider. This is important. Contact a health care provider if:  You keep feeling nauseous or you keep vomiting.  You feel light-headed.  You develop a rash.  You have a fever. Get help right away if:  You have trouble breathing. Summary  For several hours after your procedure, you may feel sleepy and have poor judgment.  Have a responsible adult stay with you for at least 24 hours or until you are awake and alert. This information is not intended to replace advice given to you by your health care provider. Make sure you discuss any questions you have with your health care provider. Document Released: 08/25/2015 Document Revised: 08/02/2017 Document Reviewed: 08/25/2015 Elsevier Patient Education  2020 [employer].

## 2019-02-08 NOTE — Op Note (Signed)
[facility]  Talked to [place] and notified procedure completed. CXR pending.   Curlene Labrum, MD [facility] [address] [place], [place] [address] [phone] (office)

## 2019-02-08 NOTE — Anesthesia Preprocedure Evaluation (Signed)
Anesthesia Evaluation  Patient identified by MRN, date of birth, ID band Patient awake    Reviewed: Allergy & Precautions, NPO status , Patient's Chart, lab work & pertinent test results  Airway Mallampati: II  TM Distance: >3 FB Neck ROM: Full    Dental no notable dental hx. (+) Teeth Intact   Pulmonary neg pulmonary ROS,    Pulmonary exam normal breath sounds clear to auscultation       Cardiovascular Exercise Tolerance: Good hypertension, Pt. on medications negative cardio ROS Normal cardiovascular examI Rhythm:Regular Rate:Normal     Neuro/Psych Depression negative neurological ROS  negative psych ROS   GI/Hepatic negative GI ROS, Neg liver ROS,   Endo/Other  negative endocrine ROSdiabetes, Well Controlled, Type 1, Insulin Dependent, Oral Hypoglycemic Agents  Renal/GU negative Renal ROS  negative genitourinary   Musculoskeletal negative musculoskeletal ROS (+)   Abdominal   Peds negative pediatric ROS (+)  Hematology negative hematology ROS (+)   Anesthesia Other Findings Here for port for CT for Rectal Ca -remote h/o Cervical Ca  Reproductive/Obstetrics negative OB ROS                             Anesthesia Physical Anesthesia Plan  ASA: III  Anesthesia Plan: MAC   Post-op Pain Management:    Induction: Intravenous  PONV Risk Score and Plan: 2 and Propofol infusion, TIVA, Ondansetron and Treatment may vary due to age or medical condition  Airway Management Planned: Nasal Cannula and Simple Face Mask  Additional Equipment:   Intra-op Plan:   Post-operative Plan: Extubation in OR  Informed Consent: I have reviewed the patients History and Physical, chart, labs and discussed the procedure including the risks, benefits and alternatives for the proposed anesthesia with the patient or authorized representative who has indicated his/her understanding and acceptance.      Dental advisory given  Plan Discussed with: CRNA  Anesthesia Plan Comments: (Plan Full PPE use  Plan GA with GETA as needed d/w pt -WTP with same after Q&A)        Anesthesia Quick Evaluation

## 2019-02-09 ENCOUNTER — Encounter (HOSPITAL_COMMUNITY): Payer: Self-pay | Admitting: General Surgery

## 2019-02-10 ENCOUNTER — Other Ambulatory Visit (HOSPITAL_COMMUNITY): Payer: Medicare HMO

## 2019-02-13 ENCOUNTER — Encounter (HOSPITAL_COMMUNITY)
Admission: RE | Admit: 2019-02-13 | Discharge: 2019-02-13 | Disposition: A | Discharge status: Home / Self Care | Payer: Medicare HMO | Source: Ambulatory Visit | Attending: Hematology | Admitting: Hematology

## 2019-02-13 ENCOUNTER — Other Ambulatory Visit: Payer: Self-pay

## 2019-02-13 DIAGNOSIS — E119 Type 2 diabetes mellitus without complications: Secondary | ICD-10-CM | POA: Insufficient documentation

## 2019-02-13 DIAGNOSIS — Z8541 Personal history of malignant neoplasm of cervix uteri: Secondary | ICD-10-CM | POA: Diagnosis not present

## 2019-02-13 DIAGNOSIS — I700 Atherosclerosis of aorta: Secondary | ICD-10-CM | POA: Diagnosis not present

## 2019-02-13 DIAGNOSIS — C210 Malignant neoplasm of anus, unspecified: Secondary | ICD-10-CM | POA: Insufficient documentation

## 2019-02-13 DIAGNOSIS — C775 Secondary and unspecified malignant neoplasm of intrapelvic lymph nodes: Secondary | ICD-10-CM | POA: Diagnosis not present

## 2019-02-13 DIAGNOSIS — I10 Essential (primary) hypertension: Secondary | ICD-10-CM | POA: Insufficient documentation

## 2019-02-13 LAB — GLUCOSE, CAPILLARY: Glucose-Capillary: 124 mg/dL — ABNORMAL HIGH (ref 70–99)

## 2019-02-13 MED ORDER — FLUDEOXYGLUCOSE F - 18 (FDG) INJECTION
10.8000 | Freq: Once | INTRAVENOUS | Status: AC | PRN
  Administered 2019-02-13: 14:00:00 10.8 via INTRAVENOUS

## 2019-02-14 ENCOUNTER — Encounter (HOSPITAL_COMMUNITY): Payer: Self-pay | Admitting: Hematology

## 2019-02-14 ENCOUNTER — Inpatient Hospital Stay (HOSPITAL_BASED_OUTPATIENT_CLINIC_OR_DEPARTMENT_OTHER): Payer: Medicare HMO | Admitting: Hematology

## 2019-02-14 ENCOUNTER — Other Ambulatory Visit (HOSPITAL_COMMUNITY): Payer: Self-pay | Admitting: *Deleted

## 2019-02-14 DIAGNOSIS — C210 Malignant neoplasm of anus, unspecified: Secondary | ICD-10-CM | POA: Diagnosis not present

## 2019-02-14 NOTE — Progress Notes (Signed)
I met with patient during the visit with Dr. Delton Coombes today. She was here alone but we talked to her husband on the phone.  I explained how I am involved in her care and provided my contact information for her. She was given the opportunity to ask questions and all were answered to her satisfaction.

## 2019-02-14 NOTE — Assessment & Plan Note (Signed)
1.  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  Because of COVID-19, she did not seek medical attention.  She used OTC creams like Monistat. -She was evaluated by Dr. Constance Haw, biopsy of the internal anal mass and perianal ulceration on 01/26/2019. - Biopsy showed squamous cell carcinoma of both areas. - Physical examination showed ulcerated lesions around the anal orifice, predominantly on the right buttock cheek and left buttock cheek (right more than left).  There is a mass in the lower part of the anal canal towards the right side.  P 16 stain was requested. 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  Small left axillary lymph node measuring 4 mm with SUV of 3.2.  High left subpectoral lymph node 3 mm with SUV of 3.1. - I have recommended biopsy of the left axillary lymph node if possible.  It is slightly unusual site for anal cancer metastasis.  If the biopsy is not feasible, we will treated with chemoradiation therapy and follow it on subsequent PET scan. -I had prolonged discussion about the management of anal cancer with combination chemoradiation therapy.  We talked about mitomycin and 5-FU regimen. -We will make a referral to radiation oncology at [facility].  She already has port placed.  2.  Perianal pain: - She is currently taking oxycodone 5 mg as needed.  It is causing her severe constipation. -I will switch her to hydrocodone 5 mg to be taken twice daily as needed.  She was instructed to take stool softener.  3.  Diabetes: -She is on Tresiba 76 units in the morning and Invokana 300 mg daily. 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

## 2019-02-14 NOTE — Patient Instructions (Signed)
Jacqueline Bird at [facility] Discharge Instructions  You were seen today by Dr. Delton Coombes. He discussed your recent PET scan and went over the results in detail. You have cancer in your rectum and on the outside at that area.  You also have a few lymph nodes in the inguinal area.  There is one area of concern in your left axilla.  The only way to tell if that spot is cancer is to get a biopsy of it.  If we can not get a biopsy then we will still proceed with curative treatment.  He discussed the options with you.  You will need radiation along with chemotherapy. The radiation will be done at [facility] every day for approximately 6 weeks.  You will get chemotherapy once at the beginning of radiation and once at the end.  It will consist of getting an infusion here at [facility] and then taking a pump home for the remainder of the week for continuous chemotherapy infusion.  You will return at the end of that week and have the pump removed.    We will discuss this further once we get an opinion on a biopsy of the area under your arm.  You will also have a chemotherapy education class where we will go over your treatment and how to manage side effects at home.     Thank you for choosing [place] [facility] to provide your oncology and hematology care.  To afford each patient quality time with our provider, please arrive at least 15 minutes before your scheduled appointment time.   If you have a lab appointment with the [facility] please come in thru the Main Entrance and check in at the main information desk.  You need to re-schedule your appointment should you arrive 10 or more minutes late.  We strive to give you quality time with our providers, and arriving late affects you and other patients whose appointments are after yours.  Also, if you no show three or more times for appointments you may be dismissed from the clinic  at the providers discretion.     Again, thank you for choosing [facility].  Our hope is that these requests will decrease the amount of time that you wait before being seen by our physicians.       _____________________________________________________________  Should you have questions after your visit to [facility], please contact our office at [phone] between the hours of 8:00 a.m. and 4:30 p.m.  Voicemails left after 4:00 p.m. will not be returned until the following business day.  For prescription refill requests, have your pharmacy contact our office and allow 72 hours.    Due to Covid, you will need to wear a mask upon entering the hospital. If you do not have a mask, a mask will be given to you at the Main Entrance upon arrival. For doctor visits, patients may have 1 support person with them. For treatment visits, patients can not have anyone with them due to social distancing guidelines and our immunocompromised population.

## 2019-02-14 NOTE — Progress Notes (Signed)
[place], [place] [address]   CLINIC:  Medical Oncology/Hematology  PCP:  Jacqueline Bird, [place] [address] [phone]   REASON FOR VISIT:  Follow-up for anal cancer.  CURRENT THERAPY: Under work-up.    INTERVAL HISTORY:  [name] 68 y.o. female seen for follow-up of anal cancer.  She underwent PET CT scan on 02/13/2019.  She complains of pain in the perianal region.  She has taken 1 tablet of oxycodone but ended up having a severe constipation.  She has not been taking it since then.  She requests milder form of pain medication.  Numbness in the feet has been stable.  Occasional nausea is present but denied any vomiting.  Appetite and energy levels are 100%.  Pain in the perianal region is rated as 7 out of 10.    REVIEW OF SYSTEMS:  Review of Systems  Gastrointestinal: Positive for nausea.  Neurological: Positive for numbness.  Psychiatric/Behavioral: Positive for sleep disturbance.  All other systems reviewed and are negative.    PAST MEDICAL/SURGICAL HISTORY:  Past Medical History:  Diagnosis Date  . 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 Cervical cancer ([place])   . Depression   . Diabetes mellitus without complication ([place])   . History of cervical cancer   . Hypertension    Past Surgical History:  Procedure Laterality Date  . CATARACT EXTRACTION, BILATERAL    . CERVICAL CONE BIOPSY    . HAND AMPUTATION Left   . PORTACATH PLACEMENT Left 02/08/2019   Procedure: INSERTION PORT-A-CATH;  Surgeon: Virl Cagey, MD;  Location: [facility];  Service: General;  Laterality: Left;  . RECTAL BIOPSY  01/26/2019   Procedure: BIOPSY OF ANAL MASS;  Surgeon: Virl Cagey, MD;  Location: [facility];  Service: General;;  . RECTAL EXAM UNDER ANESTHESIA N/A 01/26/2019   Procedure: RECTAL EXAM UNDER ANESTHESIA;  Surgeon: Virl Cagey, MD;  Location: [facility];  Service: General;  Laterality: N/A;      SOCIAL HISTORY:  Social History   Socioeconomic History  . Marital status: Single    Spouse name: Not on file  . Number of children: 2  . Years of education: Not on file  . Highest education level: Not on file  Occupational History  . Occupation: retired  Scientific laboratory technician  . Financial resource strain: Somewhat hard  . Food insecurity    Worry: Never true    Inability: Never true  . Transportation needs    Medical: No    Non-medical: No  Tobacco Use  . Smoking status: Never Smoker  . Smokeless tobacco: Never Used  Substance and Sexual Activity  . Alcohol use: Not Currently    Frequency: Never  . Drug use: Never  . Sexual activity: Not on file  Lifestyle  . Physical activity    Days per week: 7 days    Minutes per session: 60 min  . Stress: To some extent  Relationships  . Social Herbalist on phone: Three times a week    Gets together: Three times a week    Attends religious service: 1 to 4 times per year    Active member of club or organization: Yes    Attends meetings of clubs or organizations: 1 to 4 times per year    Relationship status: Separated  . Intimate partner violence    Fear of current or ex partner: No  Emotionally abused: No    Physically abused: No    Forced sexual activity: No  Other Topics Concern  . Not on file  Social History Narrative   Pt is retired and currently engaged.    FAMILY HISTORY:  Family History  Problem Relation Age of Onset  . Cataracts Mother   . Glaucoma Mother   . Heart disease Mother   . Diabetes Father   . Heart attack Father   . Cataracts Sister   . Lung disease Sister   . Cataracts Brother   . Diabetes Brother   . Heart disease Brother   . Macular degeneration Maternal Aunt   . Diabetes Maternal Grandfather   . Heart disease Sister   . Diabetes Sister   . Throat cancer Son   . Amblyopia Neg Hx   . Blindness Neg Hx   . Retinal detachment Neg Hx   . Strabismus Neg Hx   . Retinitis pigmentosa Neg  Hx     CURRENT MEDICATIONS:  Outpatient Encounter Medications as of 02/14/2019  Medication Sig Note  . gabapentin (NEURONTIN) 100 MG capsule Take 100 mg by mouth 3 (three) times daily.   . INVOKANA 300 MG TABS tablet Take 300 mg by mouth daily.   Marland Kitchen lisinopril (ZESTRIL) 20 MG tablet Take 20 mg by mouth daily.   . nabumetone (RELAFEN) 750 MG tablet Take 750 mg by mouth 2 (two) times daily.   . simvastatin (ZOCOR) 40 MG tablet Take 40 mg by mouth daily.    . traZODone (DESYREL) 50 MG tablet Take 50 mg by mouth at bedtime.   Tyler Aas FLEXTOUCH 200 UNIT/ML SOPN Inject 76 Units into the skin every morning.    . TRINTELLIX 10 MG TABS tablet Take 10 mg by mouth daily.   . TRULICITY [id number] [id number] SOPN Inject 0.75 mg into the muscle every Sunday.    Marland Kitchen acetaminophen (TYLENOL) 500 MG tablet Take 1,000 mg by mouth every 8 (eight) hours as needed (pain).   Marland Kitchen docusate sodium (COLACE) 100 MG capsule Take 1 capsule (100 mg total) by mouth 2 (two) times daily. (Patient not taking: Reported on 02/14/2019) 02/01/2019: OTC   . Lidocaine, Anorectal, 5 % GEL Apply 1 application topically 4 (four) times daily as needed. (Patient not taking: Reported on 02/14/2019)   . oxyCODONE (ROXICODONE) 5 MG immediate release tablet Take 1-2 tablets (5-10 mg total) by mouth every 4 (four) hours as needed. (Patient not taking: Reported on 02/14/2019)   . silver sulfADIAZINE (SILVADENE) 1 % cream Apply to affected area daily (Patient not taking: Reported on 02/14/2019)    No facility-administered encounter medications on file as of 02/14/2019.     ALLERGIES:  Allergies  Allergen Reactions  . Sulfa Antibiotics Anaphylaxis    Per pt, can use creams with sulfa in it  . Sulfamethoxazole Other (See Comments)    Tongue swelling     PHYSICAL EXAM:  ECOG Performance status: 1  Vitals:   02/14/19 1512  BP: (!) 166/58  Pulse: 81  Resp: 20  Temp: 97.7 F (36.5 C)  SpO2: 99%   Filed Weights   02/14/19 1512  Weight: 208 lb  11.2 oz (94.7 kg)    Physical Exam Vitals signs reviewed.  Constitutional:      Appearance: Normal appearance.  Cardiovascular:     Rate and Rhythm: Normal rate and regular rhythm.     Heart sounds: Normal heart sounds.  Pulmonary:     Effort: Pulmonary effort is normal.  Breath sounds: Normal breath sounds.  Abdominal:     General: There is no distension.     Palpations: Abdomen is soft. There is no mass.  Musculoskeletal:        General: No swelling.  Skin:    General: Skin is warm.  Neurological:     General: No focal deficit present.     Mental Status: She is alert and oriented to person, place, and time.  Psychiatric:        Mood and Affect: Mood normal.        Behavior: Behavior normal.      LABORATORY DATA:  I have reviewed the labs as listed.  CBC    Component Value Date/Time   WBC 7.2 01/25/2019 0822   RBC 4.59 01/25/2019 0822   HGB 13.5 01/25/2019 0822   HCT 42.5 01/25/2019 0822   PLT 235 01/25/2019 0822   MCV 92.6 01/25/2019 0822   MCH 29.4 01/25/2019 0822   MCHC 31.8 01/25/2019 0822   RDW 12.5 01/25/2019 0822   CMP Latest Ref Rng & Units 01/25/2019  Glucose 70 - 99 mg/dL 125(H)  BUN 8 - 23 mg/dL 36(H)  Creatinine 0.44 - 1.00 mg/dL 0.92  Sodium 135 - 145 mmol/L 137  Potassium 3.5 - 5.1 mmol/L 4.2  Chloride 98 - 111 mmol/L 104  CO2 22 - 32 mmol/L 25  Calcium 8.9 - 10.3 mg/dL 9.0       DIAGNOSTIC IMAGING:  I have independently reviewed the scans and discussed with the patient.     ASSESSMENT & PLAN:   Anal cancer ([place]) 1.  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  Because of COVID-19, she did not seek medical attention.  She used OTC creams like Monistat. -She was evaluated by Dr. Constance Haw, biopsy of the internal anal mass and perianal ulceration on 01/26/2019. - Biopsy showed squamous cell carcinoma of both areas. - Physical examination showed ulcerated  lesions around the anal orifice, predominantly on the right buttock cheek and left buttock cheek (right more than left).  There is a mass in the lower part of the anal canal towards the right side.  P 16 stain was requested. 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  Small left axillary lymph node measuring 4 mm with SUV of 3.2.  High left subpectoral lymph node 3 mm with SUV of 3.1. - I have recommended biopsy of the left axillary lymph node if possible.  It is slightly unusual site for anal cancer metastasis.  If the biopsy is not feasible, we will treated with chemoradiation therapy and follow it on subsequent PET scan. -I had prolonged discussion about the management of anal cancer with combination chemoradiation therapy.  We talked about mitomycin and 5-FU regimen. -We will make a referral to radiation oncology at [facility] long.  She already has port placed.  2.  Perianal pain: - She is currently taking oxycodone 5 mg as needed.  It is causing her severe constipation. -I will switch her to hydrocodone 5 mg to be taken twice daily as needed.  She was instructed to take stool softener.  3.  Diabetes: -She is on Tresiba 76 units in the morning and Invokana 300 mg daily. 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   Total time spent is 40 minutes with more than  50% of the time spent face-to-face discussing scan results, treatment plan, counseling and coordination of care.  Orders placed this encounter:  No orders of the defined types were placed in this encounter.     Derek Jack, MD [place] [phone]

## 2019-02-15 ENCOUNTER — Telehealth: Payer: Self-pay | Admitting: General Surgery

## 2019-02-15 ENCOUNTER — Telehealth: Payer: Self-pay | Admitting: Radiation Oncology

## 2019-02-15 MED ORDER — HYDROCODONE-ACETAMINOPHEN 5-325 MG PO TABS: 1.0000 | ORAL_TABLET | Freq: Two times a day (BID) | ORAL | 0 refills | Status: DC | PRN

## 2019-02-15 NOTE — Telephone Encounter (Signed)
New message:   LVM for pt to return call to schedule appt from referral received

## 2019-02-15 NOTE — Telephone Encounter (Signed)
[facility]  I have reviewed the PET and CT scan. This axillary node is 3 mm and is deep to the pectoralis. Very close to the vessels.  There is no way that I could clinically find this in the OR for an excisional biopsy. Reviewed with Dr. Thornton Papas and he does not think it is possible to percutaneously biopsy this small LN in the location.  I think given the size and location the risk of attempting outweighs the benefit of attempting an excision from the standpoint of bleeding, disruption of lymphatics etc, and possibility of not locating this lymph node.    Discussed with Dr. Delton Coombes.   Curlene Labrum, MD [facility] [address] [place], [place] [address] [phone] (office)

## 2019-02-15 NOTE — Progress Notes (Signed)
GI Location of Tumor / Histology: Anal Cancer  Jacqueline Bird presented with feelings of being raw in the perianal area since March 2020.  Because of COVID-19, she did not seek medical attention.  She used OTC creams like Monistat.   PET CT scan on 02/13/2019 showed hypermetabolic anal primary and bilateral inguinal and right external iliac lymph node metastasis.  Small left axillary lymph node measuring 4 mm with SUV of 3.2.  High left subpectoral lymph node 3 mm with SUV of 3.1.  Biopsies of Anus 01/26/2019   Past/Anticipated interventions by surgeon, if any:     Past/Anticipated interventions by medical oncology, if any:  Dr. Delton Coombes 02/14/2019 - I have recommended biopsy of the left axillary lymph node if possible.  It is slightly unusual site for anal cancer metastasis.  If the biopsy is not feasible, we will treated with chemoradiation therapy and follow it on subsequent PET scan. -I had prolonged discussion about the management of anal cancer with combination chemoradiation therapy.  We talked about mitomycin and 5-FU regimen. -We will make a referral to radiation oncology at [facility] long.  She already has port placed.   Physical examination showed ulcerated lesions around the anal orifice, predominantly on the right buttock cheek and left buttock cheek (right more than left).  There is a mass in the lower part of the anal canal towards the right side.  P 16 stain was requested.   Weight changes, if any: Stable  Bowel/Bladder complaints, if any: Constipated, taking stool softners  Nausea / Vomiting, if any: Nausea, no vomiting.  Pain issues, if any:  General pain  Any blood per rectum: No    SAFETY ISSUES:  Prior radiation? No  Pacemaker/ICD? No  Possible current pregnancy?  Postmenopausal  Is the patient on methotrexate? No  Current Complaints/Details:

## 2019-02-16 ENCOUNTER — Encounter: Payer: Self-pay | Admitting: Radiation Oncology

## 2019-02-16 ENCOUNTER — Ambulatory Visit
Admission: RE | Admit: 2019-02-16 | Discharge: 2019-02-16 | Disposition: A | Discharge status: Home / Self Care | Payer: Medicare HMO | Source: Ambulatory Visit | Attending: Radiation Oncology | Admitting: Radiation Oncology

## 2019-02-16 ENCOUNTER — Other Ambulatory Visit: Payer: Self-pay

## 2019-02-16 VITALS — BP 137/65 | HR 79 | Temp 98.3°F | Resp 18 | Ht 64.0 in | Wt 211.4 lb

## 2019-02-16 DIAGNOSIS — C210 Malignant neoplasm of anus, unspecified: Secondary | ICD-10-CM

## 2019-02-16 DIAGNOSIS — C774 Secondary and unspecified malignant neoplasm of inguinal and lower limb lymph nodes: Secondary | ICD-10-CM | POA: Insufficient documentation

## 2019-02-16 DIAGNOSIS — Z510 Encounter for antineoplastic radiation therapy: Secondary | ICD-10-CM | POA: Insufficient documentation

## 2019-02-16 DIAGNOSIS — K6289 Other specified diseases of anus and rectum: Secondary | ICD-10-CM

## 2019-02-16 NOTE — Progress Notes (Signed)
Radiation Oncology         [phone]) [phone] ________________________________  Name: Jacqueline Bird        MRN: [id number]  Date of Service: 02/16/2019 DOB: Mar 15, 1951  GE:[id number], Jacqueline Lovett, MD  Derek Jack, MD     REFERRING PHYSICIAN: Derek Jack, MD   DIAGNOSIS: The primary encounter diagnosis was Anal cancer [facility]). A diagnosis of Mass of anus was also pertinent to this visit.   HISTORY OF PRESENT ILLNESS: Jacqueline Bird is a 68 y.o. female seen at the request of Dr. Delton Coombes for a newly diagnosed anal cancer. The patient had been having perianal discomfort and raw skin in the perineum beginning in March 2020, and due to covid pandemic did not seek medical attention until recently. She met with general surgery and Dr. Constance Haw evaluated her and was able to proceed with a biopsy on 01/26/2019 that revealed a squamous cell carcinoma of the biopsy of the ulcerative lesions on the anal orafice and buttock. Her tumor was P16 positive. She had a PEt scan on 02/13/2019 revealing hypermetabolic activity in the anus, bilateral inguinal, and right external iliac node. She had a small left axillary node measuring 4 mm with an SUV of 3.2, and a 3 mm left subpectoral node with an SUV of 3.1. A biopsy of the left axilla could not be performed due to size and positioning. She is seen today to discuss options of chemoRT. Of note she reports a history of cervical dysplasia when she was in her 15's and was treated with a cervical conization. She has had normal paps since. She believes her last was about a year ago.   PREVIOUS RADIATION THERAPY: No   PAST MEDICAL HISTORY:  Past Medical History:  Diagnosis Date  . 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 Cervical cancer ([place])   . Depression   . Diabetes mellitus without complication ([place])   . History of cervical cancer   . Hypertension        PAST SURGICAL HISTORY: Past Surgical History:  Procedure Laterality Date  . CATARACT  EXTRACTION, BILATERAL Right 05/2017  . CERVICAL CONE BIOPSY    . HAND AMPUTATION Left   . PORTACATH PLACEMENT Left 02/08/2019   Procedure: INSERTION PORT-A-CATH;  Surgeon: Virl Cagey, MD;  Location: [facility];  Service: General;  Laterality: Left;  . RECTAL BIOPSY  01/26/2019   Procedure: BIOPSY OF ANAL MASS;  Surgeon: Virl Cagey, MD;  Location: [facility];  Service: General;;  . RECTAL EXAM UNDER ANESTHESIA N/A 01/26/2019   Procedure: RECTAL EXAM UNDER ANESTHESIA;  Surgeon: Virl Cagey, MD;  Location: [facility];  Service: General;  Laterality: N/A;     FAMILY HISTORY:  Family History  Problem Relation Age of Onset  . Cataracts Mother   . Glaucoma Mother   . Heart disease Mother   . Diabetes Father   . Heart attack Father   . Cataracts Sister   . Lung disease Sister   . Cataracts Brother   . Diabetes Brother   . Heart disease Brother   . Macular degeneration Maternal Aunt   . Diabetes Maternal Grandfather   . Heart disease Sister   . Diabetes Sister   . Throat cancer Son   . Breast cancer Niece   . Breast cancer Niece   . Amblyopia Neg Hx   . Blindness Neg Hx   . Retinal detachment Neg Hx   . Strabismus Neg Hx   . Retinitis pigmentosa Neg Hx  SOCIAL HISTORY:  reports that she has never smoked. She has never used smokeless tobacco. She reports previous alcohol use. She reports that she does not use drugs. The patient is single and lives in [place]. She is retired from working as an Medical illustrator. She reports she used to work in a Stage [employer] and had an accident that severed her left hand from her wrist many years ago.   ALLERGIES: Sulfa antibiotics and Sulfamethoxazole   MEDICATIONS:  Current Outpatient Medications  Medication Sig Dispense Refill  . acetaminophen (TYLENOL) 500 MG tablet Take 1,000 mg by mouth every 8 (eight) hours as needed (pain).    Marland Kitchen docusate sodium (COLACE) 100 MG capsule Take 1 capsule (100 mg total) by mouth 2 (two)  times daily. 60 capsule 2  . gabapentin (NEURONTIN) 100 MG capsule Take 100 mg by mouth 3 (three) times daily.    Marland Kitchen HYDROcodone-acetaminophen (NORCO) 5-325 MG tablet Take 1 tablet by mouth 2 (two) times daily as needed for moderate pain. 30 tablet 0  . INVOKANA 300 MG TABS tablet Take 300 mg by mouth daily.    . Lidocaine, Anorectal, 5 % GEL Apply 1 application topically 4 (four) times daily as needed. 30 g 2  . lisinopril (ZESTRIL) 20 MG tablet Take 20 mg by mouth daily.    . nabumetone (RELAFEN) 750 MG tablet Take 750 mg by mouth 2 (two) times daily.    . silver sulfADIAZINE (SILVADENE) 1 % cream Apply to affected area daily 50 g 1  . simvastatin (ZOCOR) 40 MG tablet Take 40 mg by mouth daily.     . traZODone (DESYREL) 50 MG tablet Take 50 mg by mouth at bedtime.    Tyler Aas FLEXTOUCH 200 UNIT/ML SOPN Inject 76 Units into the skin every morning.     . TRINTELLIX 10 MG TABS tablet Take 10 mg by mouth daily.    . TRULICITY 7.06 CB/7.6EG SOPN Inject 0.75 mg into the muscle every Sunday.     Marland Kitchen oxyCODONE (ROXICODONE) 5 MG immediate release tablet Take 1-2 tablets (5-10 mg total) by mouth every 4 (four) hours as needed. (Patient not taking: Reported on 02/14/2019) 30 tablet 0   No current facility-administered medications for this encounter.      REVIEW OF SYSTEMS: On review of systems, the patient reports that she is doing okay. She reports she has been having severe pain in the anal area and is finding mild relief with oxycodone. She reports occasional bleeding from the site as well. She denies any chest pain, shortness of breath, cough, fevers, chills, night sweats, unintended weight changes. She denies any bowel or bladder disturbances, and denies abdominal pain, nausea or vomiting. She denies any new musculoskeletal or joint aches or pains. A complete review of systems is obtained and is otherwise negative.     PHYSICAL EXAM:  Wt Readings from Last 3 Encounters:  02/16/19 211 lb 6.4 oz  (95.9 kg)  02/14/19 208 lb 11.2 oz (94.7 kg)  02/08/19 210 lb 7 oz (95.5 kg)    Pain Assessment Pain Score: 2 /10  In general this is a well appearing caucasian female in no acute distress. She's alert and oriented x4 and appropriate throughout the examination. Cardiopulmonary assessment is negative for acute distress and she exhibits normal effort. Pelvic exam is deferred.   ECOG = 1  0 - Asymptomatic (Fully active, able to carry on all predisease activities without restriction)  1 - Symptomatic but completely ambulatory (Restricted in physically strenuous  activity but ambulatory and able to carry out work of a light or sedentary nature. For example, light housework, office work)  2 - Symptomatic, <50% in bed during the day (Ambulatory and capable of all self care but unable to carry out any work activities. Up and about more than 50% of waking hours)  3 - Symptomatic, >50% in bed, but not bedbound (Capable of only limited self-care, confined to bed or chair 50% or more of waking hours)  4 - Bedbound (Completely disabled. Cannot carry on any self-care. Totally confined to bed or chair)  5 - Death   Eustace Pen MM, Creech RH, Tormey DC, et al. [phone]). "Toxicity and response criteria of the [facility]". [place] Oncol. 5 (6): 649-55    LABORATORY DATA:  Lab Results  Component Value Date   WBC 7.2 01/25/2019   HGB 13.5 01/25/2019   HCT 42.5 01/25/2019   MCV 92.6 01/25/2019   PLT 235 01/25/2019   Lab Results  Component Value Date   NA 137 01/25/2019   K 4.2 01/25/2019   CL 104 01/25/2019   CO2 25 01/25/2019   No results found for: ALT, AST, GGT, ALKPHOS, BILITOT    RADIOGRAPHY: Nm Pet Image Initial (pi) Skull Base To Thigh  Result Date: 02/13/2019 CLINICAL DATA:  Initial treatment strategy for staging for anal cancer. EXAM: NUCLEAR MEDICINE PET SKULL BASE TO THIGH TECHNIQUE: 10.8 mCi F-18 FDG was injected intravenously. Full-ring PET imaging was  performed from the skull base to thigh after the radiotracer. CT data was obtained and used for attenuation correction and anatomic localization. Fasting blood glucose: 124 mg/dl COMPARISON:  None. FINDINGS: Mediastinal blood pool activity: SUV max 2.4 Liver activity: SUV max NA NECK: No areas of abnormal hypermetabolism. Incidental CT findings: No cervical adenopathy. Bilateral carotid atherosclerosis. CHEST: High left subpectoral node measures 3 mm and a S.U.V. max of 3.1 on image 45/4. Small left axillary nodes, including a 4 mm node which measures a S.U.V. max of 3.2 on 62/4. Incidental CT findings: Left Port-A-Cath tip at low SVC. Aortic atherosclerosis. ABDOMEN/PELVIS: The anal primary is eccentric left and posteriorly positioned. Measures a S.U.V. max of 9.3, including on 196/4. Bilateral hypermetabolic inguinal nodes. Index right inguinal node measures 1.3 cm and a S.U.V. max of 2.8 on 182/4. Index left inguinal node measures 1.4 cm and a S.U.V. max of 3.1 on 182/4. A right external iliac node measures 1.5 cm and a S.U.V. max of 4.0 on 178/4. No abdominal hypermetabolic nodes. Incidental CT findings: Abdominal aortic atherosclerosis. Mild pelvic floor laxity. SKELETON: Hypermetabolism corresponding to anterior lower left nonacute rib fractures. Incidental CT findings: none IMPRESSION: 1. Hypermetabolic anal primary with bilateral inguinal and right external iliac nodal metastasis. 2. Left axillary and subpectoral small, mildly hypermetabolic nodes are favored to be reactive. An atypical distribution of metastatic disease is felt less likely. 3.  Aortic Atherosclerosis (ICD10-I70.0). Electronically Signed   By: Abigail Miyamoto M.D.   On: 02/13/2019 15:59   Dg Chest Port 1 View  Result Date: 02/08/2019 CLINICAL DATA:  Port-A-Cath placement EXAM: PORTABLE CHEST 1 VIEW COMPARISON:  Portable exam 0941 hours compared to 09/17/2010 FINDINGS: LEFT subclavian Port-A-Cath with tip projecting over SVC. Normal heart  size, mediastinal contours, and pulmonary vascularity. Linear subsegmental atelectasis LEFT lower lobe. Lungs otherwise clear. No infiltrate, pleural effusion, or pneumothorax. Osseous structures unremarkable. IMPRESSION: No pneumothorax following Port-A-Cath placement. Electronically Signed   By: Lavonia Dana M.D.   On: 02/08/2019 09:54  Dg C-arm 1-60 Min-no Report  Result Date: 02/08/2019 Fluoroscopy was utilized by the requesting physician.  No radiographic interpretation.       IMPRESSION/PLAN: 1. Stage IIIC, cT3N1cM0  Squamous Cell Carcinoma of the Anus. Dr. Lisbeth Renshaw discusses the pathology findings and reviews the nature of anal cancer and the rationale to proceed with chemoRT. We discussed the risks, benefits, short, and long term effects of radiotherapy, and the patient is interested in proceeding. Dr. Lisbeth Renshaw discusses the delivery and logistics of radiotherapy and anticipates a course of 6 weeks of radiotherapy. Written consent is obtained and placed in the chart, a copy was provided to the patient. She will simulate this morning. We anticipate starting treatment on 02/27/2019.   2. Pain secondary to #1. She will continue oxycodone and will let us know if she has progressive symptoms.   This encounter was provided by telemedicine platform Webex.  The patient has given verbal consent for this type of encounter and has been advised to only accept a meeting of this type in a secure network environment. The time spent during this encounter was 60 minutes. The attendants for this meeting include Blenda Nicely, RN, Dr. Lisbeth Renshaw, Hayden Pedro  and Ames Coupe.  During the encounter,  Blenda Nicely, RN, Dr. Lisbeth Renshaw, and Hayden Pedro were located at [facility] Radiation Oncology Department.  Reneta Niehaus was located in our clinic at [facility].    The above documentation reflects my direct findings during this shared patient visit. Please see the separate note by Dr. Lisbeth Renshaw  on this date for the remainder of the patient's plan of care.    Carola Rhine, PAC

## 2019-02-21 ENCOUNTER — Inpatient Hospital Stay (HOSPITAL_COMMUNITY): Payer: Medicare HMO | Attending: Hematology | Admitting: General Practice

## 2019-02-21 DIAGNOSIS — Z801 Family history of malignant neoplasm of trachea, bronchus and lung: Secondary | ICD-10-CM | POA: Insufficient documentation

## 2019-02-21 DIAGNOSIS — F329 Major depressive disorder, single episode, unspecified: Secondary | ICD-10-CM | POA: Insufficient documentation

## 2019-02-21 DIAGNOSIS — Z79899 Other long term (current) drug therapy: Secondary | ICD-10-CM | POA: Insufficient documentation

## 2019-02-21 DIAGNOSIS — C210 Malignant neoplasm of anus, unspecified: Secondary | ICD-10-CM

## 2019-02-21 DIAGNOSIS — G893 Neoplasm related pain (acute) (chronic): Secondary | ICD-10-CM | POA: Insufficient documentation

## 2019-02-21 DIAGNOSIS — E119 Type 2 diabetes mellitus without complications: Secondary | ICD-10-CM | POA: Insufficient documentation

## 2019-02-21 DIAGNOSIS — I10 Essential (primary) hypertension: Secondary | ICD-10-CM | POA: Insufficient documentation

## 2019-02-21 DIAGNOSIS — Z803 Family history of malignant neoplasm of breast: Secondary | ICD-10-CM | POA: Insufficient documentation

## 2019-02-21 DIAGNOSIS — Z5111 Encounter for antineoplastic chemotherapy: Secondary | ICD-10-CM | POA: Insufficient documentation

## 2019-02-21 NOTE — Progress Notes (Signed)
[facility] Initial Psychosocial Assessment Clinical Social Work  Clinical Social Work contacted by phone to assess psychosocial, emotional, mental health, and spiritual needs of the patient.   Barriers to care/review of distress screen:  - Transportation:  Do you anticipate any problems getting to appointments?  Do you have someone who can help run errands for you if you need it?  Can drive herself to appointments and has others to help if needed.  Will go to [employer] daily for 6 weeks.  Feels she can either drive herself or will have help w driving.   - Help at home:  What is your living situation (alone, family, other)?  If you are physically unable to care for yourself, who would you call on to help you?  Lives alone w 2 dogs.  Sister lives around the corner, fiance in [place].  Other family members live nearby.   - Support system:  What does your support system look like?  Who would you call on if you needed some kind of practical help?  What if you needed someone to talk to for emotional support?  "I have a fiance who is going to make every step w me, two sisters and a brother that will be there for me."  Two children live out of town, will be there as needed.  Son was treated for throat cancer last year, had niece that died from cancer.  Son is doing well.   - Finances:  Are you concerned about finances.  Considering returning to work?  If not, applying for disability?  "I am worried about the financial part."  Is retired, almost 68 years old.  "I am diabetic, chose insurance based on how they will cover my medicine."  "Every time I walk through the door its $200.  Does not know the details about how much she will be charged for each radiation treatment, tried to get information on billing from insurance company but "I held on for 1 hours and 18 minutes then I hung up."  "So far, I have been able to pay my bills", hopes to be able to continue to manage to pay bills.  What is your understanding of  where you are with your cancer? Its cause?  Your treatment plan and what happens next?  "I just was totally not expecting it,I thought I had a kidney or bladder infection, something like that."  "Slammed me upside the head."  Is "very strong [place]", teaches senior high class at church, relies on faith.  "Teaches me to keep my feet on the ground, helps me cope." Has one hand, lost other hand in accident at 51. "This is just another obstacle." Feels this is a "brick wall" but confident she will get through it.  Aware that making progress will take time and effort, but has acceptance and patience w the process.  Multiple family members w cancer, niece who died, nephew and son had throat cancer successfully treated, niece had breast cancer and two mastectomies.  Will be having radiation and chemotherapy treatments.    What are your worries for the future as you begin treatment for cancer?  Finances are her major concern, source of anxiety.  Aware that bills have begun to pile up "and I haven't even gotten started."  $200 copays/visit were unexpected.    What are your hopes and priorities during your treatment? What is important to you? What are your goals for your care? "I know its going to be bad, terrible,  I just want to get through to the other side."  Thinks "it will kick my butt", but wants to "get through it."  What are you willing to sacrifice during your treatment? "personal comfort" - has seen son's experience w throat cancer, aware that her treatment will be difficult and arduous.  Aware that son's cancer treatment was more difficult that hers is anticipated to be. "I am going to make it - I just have to get through it."  USed to like to be active and was "born with wheels on my feet" but is now leery of going out due to [place] concerns.    CSW Summary:  Patient and family psychosocial functioning including strengths, limitations, and coping skills:  Significant support from family and friends.   Feels very cared for by others.  Strong faith that has given her ability to endure hardship and overcome obstacles.  Concerned about financial impact of cancer treatments and how to pay medical bills.    Identifications of barriers to care:  Financial stress  Availability of community resources:  Refer to Duanne Limerick, Cancer Care, [facility].  Edwyna Shell, LCSW Clinical Social Worker Phone:  [phone]   Clinical Social Worker follow up needed: No.

## 2019-02-23 DIAGNOSIS — Z510 Encounter for antineoplastic radiation therapy: Secondary | ICD-10-CM | POA: Diagnosis not present

## 2019-02-27 ENCOUNTER — Other Ambulatory Visit: Payer: Self-pay

## 2019-02-27 ENCOUNTER — Ambulatory Visit
Admission: RE | Admit: 2019-02-27 | Discharge: 2019-02-27 | Disposition: A | Discharge status: Home / Self Care | Payer: Medicare HMO | Source: Ambulatory Visit | Attending: Radiation Oncology | Admitting: Radiation Oncology

## 2019-02-27 DIAGNOSIS — Z510 Encounter for antineoplastic radiation therapy: Secondary | ICD-10-CM | POA: Diagnosis not present

## 2019-02-27 MED ORDER — PROCHLORPERAZINE MALEATE 10 MG PO TABS: 10.0000 mg | ORAL_TABLET | Freq: Four times a day (QID) | ORAL | 1 refills | Status: DC | PRN

## 2019-02-27 MED ORDER — LIDOCAINE-PRILOCAINE 2.5-2.5 % EX CREA: TOPICAL_CREAM | CUTANEOUS | 2 refills | Status: DC

## 2019-02-27 NOTE — Patient Instructions (Addendum)
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  You will be treated on Days 1 and 28 with two chemotherapy drugs -  Mitomycin and fluorouracil.  You will receive your mitomycin injection through your port on Monday (Day 1), then you will also be connected to a continuous portable infusion pump through which you'll receive fluorouracil.  You will be placed on the pump on Monday (Day 1) and the pump will be removed on Friday (Day 5) once the infusion is complete. This process will be repeated on Day 28. This treatment will be in conjunction with radiation therapy.  The intent of treatment is cure.  You will see the doctor regularly throughout treatment.  We will obtain blood work from you prior to every treatment and monitor your results to make sure it is safe to give your treatment. The doctor monitors your response to treatment by the way you are feeling, your blood work, and by obtaining scans periodically.  There will be wait times while you are here for treatment.  It will take about 30 minutes to 1 hour for your lab work to result.  Then there will be wait times while pharmacy mixes your medications.    Medications you will receive in the clinic prior to your chemotherapy medications:  Aloxi:  ALOXI is used in adults to help prevent nausea and vomiting that happens with certain chemotherapy drugs.  Aloxi is a long acting medication, and will remain in your system for about two days.   Dexamethasone:  This is a steroid given prior to chemotherapy to help prevent allergic reactions; it may also help prevent and control nausea and diarrhea.    Mitomycin  About This Drug Mitomycin is used to treat cancer. It is given in the vein (IV).  Possible Side Effects . Bone marrow suppression. This is a decrease in the number of white blood cells, red blood cells, and platelets. This may raise your risk of infection, make you tired and  weak (fatigue), and raise your risk of bleeding. . Fever . Soreness of the mouth and throat. You may have red areas, white patches, or sores that hurt. . Nausea and vomiting (throwing up) . Decreased appetite (decreased hunger) . Skin and tissue irritation including redness, pain, warmth, or swelling at the IV site if the drug leaks out of the vein and into nearby tissue. Very rarely it may cause local tissue necrosis (death). . Hair loss. Hair loss is often temporary, although with certain medicine, hair loss can sometimes be permanent. Hair loss may happen suddenly or gradually. If you lose hair, you may lose it from your head, face, armpits, pubic area, chest, and/or legs. You may also notice your hair getting thin.  Note: Not all possible side effects are included above.  Warnings and Precautions . Severe bone marrow suppression . Changes in your kidney function . Hemolytic uremic syndrome can occur. This is a syndrome that affects your red blood cells, platelets and blood vessels in your kidneys and can cause kidney failure and be life-threatening. . Inflammation (swelling) of the lungs. You may have a dry cough or trouble breathing  Note: Some of the side effects above are very rare. If you have concerns and/or questions, please discuss them with your medical team.  Important Information . This drug may be present in the saliva, tears, sweat, urine, stool, vomit, semen, and vaginal secretions. Talk to your doctor and/or your  nurse about the necessary precautions to take during this time.  Treating Side Effects . Manage tiredness by pacing your activities for the day. . Be sure to include periods of rest between energy-draining activities. . To decrease the risk of infection, wash your hands regularly. . Avoid close contact with people who have a cold, the flu, or other infections. . Take your temperature as your doctor or nurse tells you, and whenever you feel like you may have a  fever. . To help decrease the risk of bleeding, use a soft toothbrush. Check with your nurse before using dental floss. . Be very careful when using knives or tools. . Use an electric shaver instead of a razor. . Mouth care is very important. Your mouth care should consist of routine, gentle cleaning of your teeth or dentures and rinsing your mouth with a mixture of 1/2 teaspoon of salt in 8 ounces of water or 1/2 teaspoon of baking soda in 8 ounces of water. This should be done at least after each meal and at bedtime. . If you have mouth sores, avoid mouthwash that has alcohol. Also avoid alcohol and smoking because they can bother your mouth and throat. . Drink plenty of fluids (a minimum of eight glasses per day is recommended). More may be recommended by your doctor. . If you throw up or have loose bowel movements, you should drink more fluids so that you do not become dehydrated (lack of water in the body from losing too much fluid). . To help with nausea and vomiting, eat small, frequent meals instead of three large meals a day. Choose foods and drinks that are at room temperature. Ask your nurse or doctor about other helpful tips and medicine that is available to help stop or lessen these symptoms. . To help with decreased appetite, eat small, frequent meals. Eat foods high in calories and protein, such as meat, poultry, fish, dry beans, tofu, eggs, nuts, milk, yogurt, cheese, ice cream, pudding, and nutritional supplements. . Consider using sauces and spices to increase taste. Daily exercise, with your doctor's approval, may increase your appetite. . To help with hair loss, wash with a mild shampoo and avoid washing your hair every day. . Avoid rubbing your scalp, pat your hair or scalp dry. . Avoid coloring your hair. . Limit your use of hair spray, electric curlers, blow dryers, and curling irons.   Food and Drug Interactions . There are no known interactions of mitomycin with food. .  This drug may interact with other medicines. Tell your doctor and pharmacist about all the prescription and over-the-counter medicines and dietary supplements (vitamins, minerals, herbs and others) that you are taking at this time. Also, check with your doctor or pharmacist before starting any new prescription or over-the-counter medicines, or dietary supplements to make sure that there are no interactions.  When to Call the Doctor Call your doctor or nurse if you have any of these symptoms and/or any new or unusual symptoms: . Fever of 100.4 F (38 C) or higher . Chills . Tiredness that interferes with your daily activities . Feeling dizzy or lightheaded . Easy bleeding or bruising . Confusion . Changes in mood . Trouble breathing . Pain in your chest or abdomen . Cough . Nausea that stops you from eating or drinking and/or is not relieved by prescribed medicines . Throwing up more than 3 times a day . Lasting loss of appetite or rapid weight loss of five pounds in a week .  Pain in your mouth or throat that makes it hard to eat or drink . Decreased urine or very dark urine . While you are getting this drug, please tell your nurse right away if you have any pain, redness, or swelling at the site of the IV infusion. . If you think you may be pregnant  Reproduction Warnings . Pregnancy warning: It is not known if this drug may harm an unborn child. For this reason, be sure to talk with your doctor if you are pregnant or planning to become pregnant while receiving this drug. Let your doctor know right away if you think you may be pregnant or may have impregnated your partner. . Breastfeeding warning: It is not known if this drug passes into breast milk. For this reason, women should talk to their doctor about the risks and benefits of breastfeeding during treatment with this drug because this drug may enter the breast milk and cause harm to a breastfeeding baby. . Fertility warning:  Human fertility studies have not been done with this drug. Talk with your doctor or nurse if you plan to have children. Ask for information on sperm or egg banking.   5-Fluorouracil (Adrucil; 5FU)  About This Drug  Fluorouracil is used to treat cancer. It is given in the vein (IV). It is given as an IV push from a syringe and also as a continuous infusion given via an ambulatory pump (a pump you take home and wear for a specified amount of time).  Possible Side Effects  . Bone marrow suppression. This is a decrease in the number of white blood cells, red blood cells, and platelets. This may raise your risk of infection, make you tired and weak (fatigue), and raise your risk of bleeding  . Changes in the tissue of the heart and/or heart attack. Some changes may happen that can cause your heart to have less ability to pump blood.  . Blurred vision or other changes in eyesight  . Nausea and throwing up (vomiting)  . Diarrhea (loose bowel movements)  . Ulcers - sores that may cause pain or bleeding in your digestive tract, which includes your mouth, esophagus, stomach, small/large intestines and rectum  . Soreness of the mouth and throat. You may have red areas, white patches, or sores that hurt.  . Allergic reactions, including anaphylaxis are rare but may happen in some patients. Signs of allergic reaction to this drug may be swelling of the face, feeling like your tongue or throat are swelling, trouble breathing, rash, itching, fever, chills, feeling dizzy, and/or feeling that your heart is beating in a fast or not normal way. If this happens, do not take another dose of this drug. You should get urgent medical treatment.  . Sensitivity to light (photosensitivity). Photosensitivity means that you may become more sensitive to the sun and/or light. You may get a skin rash/reaction if you are in the sun or are exposed to sun lamps and tanning beds. Your eyes may water more, mostly  in bright light.  . Changes in your nail color, nail loss and/or brittle nail  . Darkening of the skin, or changes to the color of your skin and/or veins used for infusion  . Rash, dry skin, or itching  Note: Not all possible side effects are included above.  Warnings and Precautions  . Hand-and-foot syndrome. The palms of your hands or soles of your feet may tingle, become numb, painful, swollen, or red.  . Changes in your central  nervous system can happen. The central nervous system is made up of your brain and spinal cord. You could feel extreme tiredness, agitation, confusion, hallucinations (see or hear things that are not there), trouble understanding or speaking, loss of control of your bowels or bladder, eyesight changes, numbness or lack of strength to your arms, legs, face, or body, or coma. If you start to have any of these symptoms let your doctor know right away.  . Side effects of this drug may be unexpectedly severe in some patients  Note: Some of the side effects above are very rare. If you have concerns and/or questions, please discuss them with your medical team.  Important Information  . This drug may be present in the saliva, tears, sweat, urine, stool, vomit, semen, and vaginal secretions. Talk to your doctor and/or your nurse about the necessary precautions to take during this time.  Treating Side Effects  . Manage tiredness by pacing your activities for the day.  . Be sure to include periods of rest between energy-draining activities.  . To help decrease the risk of infections, wash your hands regularly.  . Avoid close contact with people who have a cold, the flu, or other infections.  . Take your temperature as your doctor or nurse tells you, and whenever you feel like you may have a fever.  . Use a soft toothbrush. Check with your nurse before using dental floss.  . Be very careful when using knives or tools.  . Use an electric shaver instead  of a razor.  . If you have a nose bleed, sit with your head tipped slightly forward. Apply pressure by lightly pinching the bridge of your nose between your thumb and forefinger. Call your doctor if you feel dizzy or faint or if the bleeding doesn't stop after 10 to 15 minutes.  . Drink plenty of fluids (a minimum of eight glasses per day is recommended).  . If you throw up or have loose bowel movements, you should drink more fluids so that you do not  become dehydrated (lack of water in the body from losing too much fluid).  . To help with nausea and vomiting, eat small, frequent meals instead of three large meals a day. Choose foods and drinks that are at room temperature. Ask your nurse or doctor about other helpful tips and medicine that is available to help, stop, or lessen these symptoms.  . If you have diarrhea, eat low-fiber foods that are high in protein and calories and avoid foods that can irritate your digestive tracts or lead to cramping.  . Ask your nurse or doctor about medicine that can lessen or stop your diarrhea.  . Mouth care is very important. Your mouth care should consist of routine, gentle cleaning of your teeth or dentures and rinsing your mouth with a mixture of 1/2 teaspoon of salt in 8 ounces of water or 1/2 teaspoon of baking soda in 8 ounces of water. This should be done at least after each meal and at bedtime.  . If you have mouth sores, avoid mouthwash that has alcohol. Also avoid alcohol and smoking because they can bother your mouth and throat.  Marland Kitchen Keeping your nails moisturized may help with brittleness.  . To help with itching, moisturize your skin several times day.  . Use sunscreen with SPF 30 or higher when you are outdoors even for a short time. Cover up when you are out in the sun. Wear wide-brimmed hats, long-sleeved shirts, and  pants. Keep your neck, chest, and back covered. Wear dark sun glasses when in the sun or bright lights.  . If  you get a rash do not put anything on it unless your doctor or nurse says you may. Keep the area around the rash clean and dry. Ask your doctor for medicine if your rash bothers you.  Marland Kitchen Keeping your pain under control is important to your well-being. Please tell your doctor or nurse if you are experiencing pain.  Food and Drug Interactions  . There are no known interactions of fluorouracil with food.  . Check with your doctor or pharmacist about all other prescription medicines and over-the-counter medicines and dietary supplements (vitamins, minerals, herbs and others) you are taking before starting this medicine as there are known drug interactions with 5-fluoroucacil. Also, check with your doctor or pharmacist before starting any new prescription or over-the-counter medicines, or dietary supplements to make sure that there are no interactions.  When to Call the Doctor  Call your doctor or nurse if you have any of these symptoms and/or any new or unusual symptoms:  . Fever of 100.4 F (38 C) or higher  . Chills  . Easy bleeding or bruising  . Nose bleed that doesn't stop bleeding after 10-15 minutes  . Trouble breathing  . Feeling dizzy or lightheaded  . Feeling that your heart is beating in a fast or not normal way (palpitations)  . Chest pain or symptoms of a heart attack. Most heart attacks involve pain in the center of the chest that lasts more than a few minutes. The pain may go away and come back or it can be constant. It can feel like pressure, squeezing, fullness, or pain. Sometimes pain is felt in one or both arms, the back, neck, jaw, or stomach. If any of these symptoms last 2 minutes, call 911.  Marland Kitchen Confusion and/or agitation  . Hallucinations  . Trouble understanding or speaking  . Loss of control of bowels or bladder  . Blurry vision or changes in your eyesight  . Headache that does not go away  . Numbness or lack of strength to your arms, legs, face,  or body  . Nausea that stops you from eating or drinking and/or is not relieved by prescribed medicines  . Throwing up more than 3 times a day  . Diarrhea, 4 times in one day or diarrhea with lack of strength or a feeling of being dizzy  . Pain in your mouth or throat that makes it hard to eat or drink  . Pain along the digestive tract - especially if worse after eating  . Blood in your vomit (bright red or coffee-ground) and/or stools (bright red, or black/tarry)  . Coughing up blood  . Tiredness that interferes with your daily activities  . Painful, red, or swollen areas on your hands or feet or around your nails  . A new rash or a rash that is not relieved by prescribed medicines  . Develop sensitivity to sunlight/light  . Numbness and/or tingling of your hands and/or feet  . Signs of allergic reaction: swelling of the face, feeling like your tongue or throat are swelling, trouble breathing, rash, itching, fever, chills, feeling dizzy, and/or feeling that your heart is beating in a fast or not normal way. If this happens, call 911 for emergency care.  . If you think you are pregnant or may have impregnated your partner  Reproduction Warnings  . Pregnancy warning: This drug may  have harmful effects on the unborn baby. Women of child bearing potential should use effective methods of birth control during your cancer treatment and 3 months after treatment. Men with female partners of childbearing potential should use effective methods of birth control during your cancer treatment and for 3 months after your cancer treatment. Let your doctor know right away if you think you may be pregnant or may have impregnated your partner.  . Breastfeeding warning: It is not known if this drug passes into breast milk. For this reason, Women should not breastfeed during treatment because this drug could enter the breast milk and cause harm to a breastfeeding baby.  . Fertility warning:  In men and women both, this drug may affect your ability to have children in the future. Talk with your doctor or nurse if you plan to have children. Ask for information on sperm or egg banking.  SELF CARE ACTIVITIES WHILE RECEIVING CHEMOTHERAPY:  Hydration Increase your fluid intake 48 hours prior to treatment and drink at least 8 to 12 cups (64 ounces) of water/decaffeinated beverages per day after treatment. You can still have your cup of coffee or soda but these beverages do not count as part of your 8 to 12 cups that you need to drink daily. No alcohol intake.  Medications Continue taking your normal prescription medication as prescribed.  If you start any new herbal or new supplements please let us know first to make sure it is safe.  Mouth Care Have teeth cleaned professionally before starting treatment. Keep dentures and partial plates clean. Use soft toothbrush and do not use mouthwashes that contain alcohol. Biotene is a good mouthwash that is available at most pharmacies or may be ordered by calling [phone]. Use warm salt water gargles (1 teaspoon salt per 1 quart warm water) before and after meals and at bedtime. If you need dental work, please let the doctor know before you go for your appointment so that we can coordinate the best possible time for you in regards to your chemo regimen. You need to also let your dentist know that you are actively taking chemo. We may need to do labs prior to your dental appointment.  Skin Care Always use sunscreen that has not expired and with SPF (Sun Protection Factor) of 50 or higher. Wear hats to protect your head from the sun. Remember to use sunscreen on your hands, ears, face, & feet.  Use good moisturizing lotions such as udder cream, eucerin, or even Vaseline. Some chemotherapies can cause dry skin, color changes in your skin and nails.    . Avoid long, hot showers or baths. . Use gentle, fragrance-free soaps and laundry  detergent. . Use moisturizers, preferably creams or ointments rather than lotions because the thicker consistency is better at preventing skin dehydration. Apply the cream or ointment within 15 minutes of showering. Reapply moisturizer at night, and moisturize your hands every time after you wash them.  Hair Loss (if your doctor says your hair will fall out)  . If your doctor says that your hair is likely to fall out, decide before you begin chemo whether you want to wear a wig. You may want to shop before treatment to match your hair color. . Hats, turbans, and scarves can also camouflage hair loss, although some people prefer to leave their heads uncovered. If you go bare-headed outdoors, be sure to use sunscreen on your scalp. . Cut your hair short. It eases the inconvenience of shedding lots  of hair, but it also can reduce the emotional impact of watching your hair fall out. . Don't perm or color your hair during chemotherapy. Those chemical treatments are already damaging to hair and can enhance hair loss. Once your chemo treatments are done and your hair has grown back, it's OK to resume dyeing or perming hair.  With chemotherapy, hair loss is almost always temporary. But when it grows back, it may be a different color or texture. In older adults who still had hair color before chemotherapy, the new growth may be completely gray.  Often, new hair is very fine and soft.  Infection Prevention Please wash your hands for at least 30 seconds using warm soapy water. Handwashing is the #1 way to prevent the spread of germs. Stay away from sick people or people who are getting over a cold. If you develop respiratory systems such as green/yellow mucus production or productive cough or persistent cough let us know and we will see if you need an antibiotic. It is a good idea to keep a pair of gloves on when going into grocery stores/[employer] to decrease your risk of coming into contact with germs on the  carts, etc. Carry alcohol hand gel with you at all times and use it frequently if out in public. If your temperature reaches 100.5 or higher please call the clinic and let us know.  If it is after hours or on the weekend please go to the ER if your temperature is over 100.5.  Please have your own personal thermometer at home to use.    Sex and bodily fluids If you are going to have sex, a condom must be used to protect the person that isn't taking chemotherapy. Chemo can decrease your libido (sex drive). For a few days after chemotherapy, chemotherapy can be excreted through your bodily fluids.  When using the toilet please close the lid and flush the toilet twice.  Do this for a few day after you have had chemotherapy.   Effects of chemotherapy on your sex life Some changes are simple and won't last long. They won't affect your sex life permanently.  Sometimes you may feel: . too tired . not strong enough to be very active . sick or sore  . not in the mood . anxious or low Your anxiety might not seem related to sex. For example, you may be worried about the cancer and how your treatment is going. Or you may be worried about money, or about how you family are coping with your illness. These things can cause stress, which can affect your interest in sex. It's important to talk to your partner about how you feel. Remember - the changes to your sex life don't usually last long. There's usually no medical reason to stop having sex during chemo. The drugs won't have any long term physical effects on your performance or enjoyment of sex. Cancer can't be passed on to your partner during sex  Contraception It's important to use reliable contraception during treatment. Avoid getting pregnant while you or your partner are having chemotherapy. This is because the drugs may harm the baby. Sometimes chemotherapy drugs can leave a man or woman infertile.  This means you would not be able to have children in the  future. You might want to talk to someone about permanent infertility. It can be very difficult to learn that you may no longer be able to have children. Some people find counselling helpful. There might be  ways to preserve your fertility, although this is easier for men than for women. You may want to speak to a fertility expert. You can talk about sperm banking or harvesting your eggs. You can also ask about other fertility options, such as donor eggs. If you have or have had breast cancer, your doctor might advise you not to take the contraceptive pill. This is because the hormones in it might affect the cancer.  It is not known for sure whether or not chemotherapy drugs can be passed on through semen or secretions from the vagina. Because of this some doctors advise people to use a barrier method if you have sex during treatment. This applies to vaginal, anal or oral sex. Generally, doctors advise a barrier method only for the time you are actually having the treatment and for about a week after your treatment. Advice like this can be worrying, but this does not mean that you have to avoid being intimate with your partner. You can still have close contact with your partner and continue to enjoy sex.  Animals If you have cats or birds we just ask that you not change the litter or change the cage.  Please have someone else do this for you while you are on chemotherapy.   Food Safety During and After Cancer Treatment Food safety is important for people both during and after cancer treatment. Cancer and cancer treatments, such as chemotherapy, radiation therapy, and stem cell/bone marrow transplantation, often weaken the immune system. This makes it harder for your body to protect itself from foodborne illness, also called food poisoning. Foodborne illness is caused by eating food that contains harmful bacteria, parasites, or viruses.  Foods to avoid Some foods have a higher risk of becoming tainted  with bacteria. These include: Marland Kitchen Unwashed fresh fruit and vegetables, especially leafy vegetables that can hide dirt and other contaminants . Raw sprouts, such as alfalfa sprouts . Raw or undercooked beef, especially ground beef, or other raw or undercooked meat and poultry . Fatty, fried, or spicy foods immediately before or after treatment.  These can sit heavy on your stomach and make you feel nauseous. . Raw or undercooked shellfish, such as oysters. . Sushi and sashimi, which often contain raw fish.  . Unpasteurized beverages, such as unpasteurized fruit juices, raw milk, raw yogurt, or cider . Undercooked eggs, such as soft boiled, over easy, and poached; raw, unpasteurized eggs; or foods made with raw egg, such as homemade raw cookie dough and homemade mayonnaise  Simple steps for food safety  Shop smart. . Do not buy food stored or displayed in an unclean area. . Do not buy bruised or damaged fruits or vegetables. . Do not buy cans that have cracks, dents, or bulges. . Pick up foods that can spoil at the end of your shopping trip and store them in a cooler on the way home.  Prepare and clean up foods carefully. . Rinse all fresh fruits and vegetables under running water, and dry them with a clean towel or paper towel. . Clean the top of cans before opening them. . After preparing food, wash your hands for 20 seconds with hot water and soap. Pay special attention to areas between fingers and under nails. . Clean your utensils and dishes with hot water and soap. Marland Kitchen Disinfect your kitchen and cutting boards using 1 teaspoon of liquid, unscented bleach mixed into 1 quart of water.    Dispose of old food. . Eat canned and  packaged food before its expiration date (the "use by" or "best before" date). . Consume refrigerated leftovers within 3 to 4 days. After that time, throw out the food. Even if the food does not smell or look spoiled, it still may be unsafe. Some bacteria, such as  Listeria, can grow even on foods stored in the refrigerator if they are kept for too long.  Take precautions when eating out. . At restaurants, avoid buffets and salad bars where food sits out for a long time and comes in contact with many people. Food can become contaminated when someone with a virus, often a norovirus, or another "bug" handles it. . Put any leftover food in a "to-go" container yourself, rather than having the server do it. And, refrigerate leftovers as soon as you get home. . Choose restaurants that are clean and that are willing to prepare your food as you order it cooked.   AT HOME MEDICATIONS:                                                                                                                                                                Compazine/Prochlorperazine 10mg  tablet. Take 1 tablet every 6 hours as needed for nausea/vomiting. (This can make you sleepy)   EMLA cream. Apply a quarter size amount to port site 1 hour prior to chemo. Do not rub in. Cover with plastic wrap.    Diarrhea Sheet   If you are having loose stools/diarrhea, please purchase Imodium and begin taking as outlined:  At the first sign of poorly formed or loose stools you should begin taking Imodium (loperamide) 2 mg capsules.  Take two tablets (4mg ) followed by one tablet (2mg ) every 2 hours - DO NOT EXCEED 8 tablets in 24 hours.  If it is bedtime and you are having loose stools, take 2 tablets at bedtime, then 2 tablets every 4 hours until morning.   Always call the [facility] if you are having loose stools/diarrhea that you can't get under control.  Loose stools/diarrhea leads to dehydration (loss of water) in your body.  We have other options of trying to get the loose stools/diarrhea to stop but you must let us know!   Constipation Sheet  Colace - 100 mg capsules - take 2 capsules daily.  If this doesn't help then you can increase to 2 capsules twice daily.  Please call if  the above does not work for you. Do not go more than 2 days without a bowel movement.  It is very important that you do not become constipated.  It will make you feel sick to your stomach (nausea) and can cause abdominal pain and vomiting.  Nausea Sheet   Compazine/Prochlorperazine 10mg  tablet. Take 1 tablet every 6 hours as needed for  nausea/vomiting (This can make you drowsy).  If you are having persistent nausea (nausea that does not stop) please call the [facility] and let us know the amount of nausea that you are experiencing.  If you begin to vomit, you need to call the [facility] and if it is the weekend and you have vomited more than one time and can't get it to stop-go to the Emergency Room.  Persistent nausea/vomiting can lead to dehydration (loss of fluid in your body) and will make you feel very weak and unwell. Ice chips, sips of clear liquids, foods that are at room temperature, crackers, and toast tend to be better tolerated.    SYMPTOMS TO REPORT AS SOON AS POSSIBLE AFTER TREATMENT:  FEVER GREATER THAN 100.5 F  CHILLS WITH OR WITHOUT FEVER  NAUSEA AND VOMITING THAT IS NOT CONTROLLED WITH YOUR NAUSEA MEDICATION  UNUSUAL SHORTNESS OF BREATH  UNUSUAL BRUISING OR BLEEDING  TENDERNESS IN MOUTH AND THROAT WITH OR WITHOUT   PRESENCE OF ULCERS  URINARY PROBLEMS  BOWEL PROBLEMS  UNUSUAL RASH    Wear comfortable clothing and clothing appropriate for easy access to any Portacath or PICC line. Let us know if there is anything that we can do to make your therapy better!    What to do if you need assistance after hours or on the weekends: CALL [phone].  HOLD on the line, do not hang up.  You will hear multiple messages but at the end you will be connected with a nurse triage line.  They will contact the doctor if necessary.  Most of the time they will be able to assist you.  Do not call the hospital operator.      I have been informed and understand all of the  instructions given to me and have received a copy. I have been instructed to call the clinic [phone] or my family physician as soon as possible for continued medical care, if indicated. I do not have any more questions at this time but understand that I may call the [facility] or the Patient Navigator at [phone] during office hours should I have questions or need assistance in obtaining follow-up care.

## 2019-02-28 ENCOUNTER — Other Ambulatory Visit: Payer: Self-pay

## 2019-02-28 ENCOUNTER — Ambulatory Visit
Admission: RE | Admit: 2019-02-28 | Discharge: 2019-02-28 | Disposition: A | Discharge status: Home / Self Care | Payer: Medicare HMO | Source: Ambulatory Visit | Attending: Radiation Oncology | Admitting: Radiation Oncology

## 2019-02-28 DIAGNOSIS — Z510 Encounter for antineoplastic radiation therapy: Secondary | ICD-10-CM | POA: Diagnosis not present

## 2019-03-01 ENCOUNTER — Ambulatory Visit
Admission: RE | Admit: 2019-03-01 | Discharge: 2019-03-01 | Disposition: A | Discharge status: Home / Self Care | Payer: Medicare HMO | Source: Ambulatory Visit | Attending: Radiation Oncology | Admitting: Radiation Oncology

## 2019-03-01 ENCOUNTER — Other Ambulatory Visit: Payer: Self-pay

## 2019-03-01 DIAGNOSIS — Z510 Encounter for antineoplastic radiation therapy: Secondary | ICD-10-CM | POA: Diagnosis not present

## 2019-03-01 NOTE — Progress Notes (Signed)
Pt here for patient teaching.  Pt given Radiation and You booklet.  Reviewed areas of pertinence such as diarrhea, fatigue, hair loss, nausea and vomiting, sexual and fertility changes, skin changes and urinary and bladder changes . Pt able to give teach back of to pat skin, use unscented/gentle soap, use baby wipes, have Imodium on hand, drink plenty of water and sitz bath,avoid applying anything to skin within 4 hours of treatment. Pt verbalizes understanding of information given and will contact nursing with any questions or concerns.     Calysta Craigo M. Mckinzee Spirito RN, BSN      

## 2019-03-02 ENCOUNTER — Inpatient Hospital Stay (HOSPITAL_BASED_OUTPATIENT_CLINIC_OR_DEPARTMENT_OTHER): Payer: Medicare HMO | Admitting: Hematology

## 2019-03-02 ENCOUNTER — Other Ambulatory Visit: Payer: Self-pay

## 2019-03-02 ENCOUNTER — Encounter (HOSPITAL_COMMUNITY): Payer: Self-pay | Admitting: Hematology

## 2019-03-02 ENCOUNTER — Ambulatory Visit
Admission: RE | Admit: 2019-03-02 | Discharge: 2019-03-02 | Disposition: A | Discharge status: Home / Self Care | Payer: Medicare HMO | Source: Ambulatory Visit | Attending: Radiation Oncology | Admitting: Radiation Oncology

## 2019-03-02 ENCOUNTER — Inpatient Hospital Stay (HOSPITAL_COMMUNITY): Payer: Medicare HMO

## 2019-03-02 VITALS — BP 123/49 | HR 78 | Temp 97.9°F | Resp 14 | Wt 211.5 lb

## 2019-03-02 DIAGNOSIS — Z803 Family history of malignant neoplasm of breast: Secondary | ICD-10-CM | POA: Diagnosis not present

## 2019-03-02 DIAGNOSIS — F329 Major depressive disorder, single episode, unspecified: Secondary | ICD-10-CM | POA: Diagnosis not present

## 2019-03-02 DIAGNOSIS — Z5111 Encounter for antineoplastic chemotherapy: Secondary | ICD-10-CM | POA: Diagnosis present

## 2019-03-02 DIAGNOSIS — E119 Type 2 diabetes mellitus without complications: Secondary | ICD-10-CM | POA: Diagnosis not present

## 2019-03-02 DIAGNOSIS — G893 Neoplasm related pain (acute) (chronic): Secondary | ICD-10-CM | POA: Diagnosis not present

## 2019-03-02 DIAGNOSIS — C210 Malignant neoplasm of anus, unspecified: Secondary | ICD-10-CM

## 2019-03-02 DIAGNOSIS — Z801 Family history of malignant neoplasm of trachea, bronchus and lung: Secondary | ICD-10-CM | POA: Diagnosis not present

## 2019-03-02 DIAGNOSIS — Z79899 Other long term (current) drug therapy: Secondary | ICD-10-CM | POA: Diagnosis not present

## 2019-03-02 DIAGNOSIS — Z510 Encounter for antineoplastic radiation therapy: Secondary | ICD-10-CM | POA: Diagnosis not present

## 2019-03-02 DIAGNOSIS — I10 Essential (primary) hypertension: Secondary | ICD-10-CM | POA: Diagnosis not present

## 2019-03-02 LAB — CBC WITH DIFFERENTIAL/PLATELET
Abs Immature Granulocytes: 0.03 10*3/uL (ref 0.00–0.07)
Basophils Absolute: 0.1 10*3/uL (ref 0.0–0.1)
Basophils Relative: 1 %
Eosinophils Absolute: 0.2 10*3/uL (ref 0.0–0.5)
Eosinophils Relative: 3 %
HCT: 41.7 % (ref 36.0–46.0)
Hemoglobin: 13 g/dL (ref 12.0–15.0)
Immature Granulocytes: 1 %
Lymphocytes Relative: 16 %
Lymphs Abs: 1 10*3/uL (ref 0.7–4.0)
MCH: 29.4 pg (ref 26.0–34.0)
MCHC: 31.2 g/dL (ref 30.0–36.0)
MCV: 94.3 fL (ref 80.0–100.0)
Monocytes Absolute: 0.5 10*3/uL (ref 0.1–1.0)
Monocytes Relative: 8 %
Neutro Abs: 4.4 10*3/uL (ref 1.7–7.7)
Neutrophils Relative %: 71 %
Platelets: 265 10*3/uL (ref 150–400)
RBC: 4.42 MIL/uL (ref 3.87–5.11)
RDW: 13.1 % (ref 11.5–15.5)
WBC: 6.2 10*3/uL (ref 4.0–10.5)
nRBC: 0 % (ref 0.0–0.2)

## 2019-03-02 LAB — COMPREHENSIVE METABOLIC PANEL
ALT: 18 U/L (ref 0–44)
AST: 17 U/L (ref 15–41)
Albumin: 3.4 g/dL — ABNORMAL LOW (ref 3.5–5.0)
Alkaline Phosphatase: 54 U/L (ref 38–126)
Anion gap: 9 (ref 5–15)
BUN: 27 mg/dL — ABNORMAL HIGH (ref 8–23)
CO2: 24 mmol/L (ref 22–32)
Calcium: 8.8 mg/dL — ABNORMAL LOW (ref 8.9–10.3)
Chloride: 107 mmol/L (ref 98–111)
Creatinine, Ser: 0.95 mg/dL (ref 0.44–1.00)
GFR calc Af Amer: >60 mL/min (ref >60)
GFR calc non Af Amer: >60 mL/min (ref >60)
Glucose, Bld: 126 mg/dL — ABNORMAL HIGH (ref 70–99)
Potassium: 4.1 mmol/L (ref 3.5–5.1)
Sodium: 140 mmol/L (ref 135–145)
Total Bilirubin: 0.6 mg/dL (ref 0.3–1.2)
Total Protein: 6.8 g/dL (ref 6.5–8.1)

## 2019-03-02 LAB — HIV ANTIBODY (ROUTINE TESTING W REFLEX): HIV Screen 4th Generation wRfx: NONREACTIVE

## 2019-03-02 MED ORDER — ALPRAZOLAM 0.5 MG PO TABS: 0.5000 mg | ORAL_TABLET | Freq: Two times a day (BID) | ORAL | 0 refills | Status: DC | PRN

## 2019-03-02 NOTE — Progress Notes (Signed)

## 2019-03-02 NOTE — Progress Notes (Signed)
Jacqueline Bird, [place] [address]   CLINIC:  Medical Oncology/Hematology  PCP:  Jacqueline Bird, [place] [address] [phone]   REASON FOR VISIT:  Follow-up for anal cancer.  CURRENT THERAPY: Chemoradiation therapy.  BRIEF ONCOLOGIC HISTORY:  Oncology History  Anal cancer ([place])  02/01/2019 Initial Diagnosis   Anal cancer ([place])   02/16/2019 Cancer Staging   Staging form: Anus, AJCC 8th Edition - Clinical: Stage IIIC (cT3, cN1c, cM0) - Signed by Jacqueline Rudd, MD on 02/16/2019   03/03/2019 -  Chemotherapy   The patient had mitoMYcin (MUTAMYCIN) chemo injection 21 mg, 10 mg/m2 = 21 mg, Intravenous,  Once, 0 of 1 cycle fluorouracil (ADRUCIL) 8,300 mg in sodium chloride 0.9 % 84 mL chemo infusion, 1,000 mg/m2/day = 8,300 mg, Intravenous, 4D (96 hours ), 0 of 1 cycle  for chemotherapy treatment.       CANCER STAGING: Cancer Staging Anal cancer ([place]) Staging form: Anus, AJCC 8th Edition - Clinical: Stage IIIC (cT3, cN1c, cM0) - Signed by Jacqueline Rudd, MD on 02/16/2019    INTERVAL HISTORY:  Jacqueline Bird 68 y.o. female seen for follow-up of anal cancer.  She reportedly started radiation therapy on 02/27/2019 at [facility].  She is taking hydrocodone as needed for.  She is accompanied by her fianc today.  She reports she is very anxious.  She requests some medication for it.  Appetite and energy levels are 100%.  Numbness in the feet has been stable.  Occasional diarrhea is also stable.    REVIEW OF SYSTEMS:  Review of Systems  Gastrointestinal: Positive for diarrhea.  Neurological: Positive for numbness.  Psychiatric/Behavioral: Positive for sleep disturbance. The patient is nervous/anxious.   All other systems reviewed and are negative.    PAST MEDICAL/SURGICAL HISTORY:  Past Medical History:  Diagnosis Date  . 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 Cervical cancer ([place])   . Depression   . Diabetes  mellitus without complication ([place])   . History of cervical cancer   . Hypertension    Past Surgical History:  Procedure Laterality Date  . CATARACT EXTRACTION, BILATERAL Right 05/2017  . CERVICAL CONE BIOPSY    . HAND AMPUTATION Left   . PORTACATH PLACEMENT Left 02/08/2019   Procedure: INSERTION PORT-A-CATH;  Surgeon: Virl Cagey, MD;  Location: [facility];  Service: General;  Laterality: Left;  . RECTAL BIOPSY  01/26/2019   Procedure: BIOPSY OF ANAL MASS;  Surgeon: Virl Cagey, MD;  Location: [facility];  Service: General;;  . RECTAL EXAM UNDER ANESTHESIA N/A 01/26/2019   Procedure: RECTAL EXAM UNDER ANESTHESIA;  Surgeon: Virl Cagey, MD;  Location: [facility];  Service: General;  Laterality: N/A;     SOCIAL HISTORY:  Social History   Socioeconomic History  . Marital status: Single    Spouse name: Not on file  . Number of children: 2  . Years of education: Not on file  . Highest education level: Not on file  Occupational History  . Occupation: retired  Scientific laboratory technician  . Financial resource strain: Somewhat hard  . Food insecurity    Worry: Never true    Inability: Never true  . Transportation needs    Medical: No    Non-medical: No  Tobacco Use  . Smoking status: Never Smoker  . Smokeless tobacco: Never Used  Substance and Sexual Activity  . Alcohol use: Not Currently    Frequency: Never  .  Drug use: Never  . Sexual activity: Not on file  Lifestyle  . Physical activity    Days per week: 7 days    Minutes per session: 60 min  . Stress: To some extent  Relationships  . Social Herbalist on phone: Three times a week    Gets together: Three times a week    Attends religious service: 1 to 4 times per year    Active member of club or organization: Yes    Attends meetings of clubs or organizations: 1 to 4 times per year    Relationship status: Separated  . Intimate partner violence    Fear of current or ex partner: No    Emotionally abused: No     Physically abused: No    Forced sexual activity: No  Other Topics Concern  . Not on file  Social History Narrative   Pt is retired and currently engaged.    FAMILY HISTORY:  Family History  Problem Relation Age of Onset  . Cataracts Mother   . Glaucoma Mother   . Heart disease Mother   . Diabetes Father   . Heart attack Father   . Cataracts Sister   . Lung disease Sister   . Cataracts Brother   . Diabetes Brother   . Heart disease Brother   . Macular degeneration Maternal Aunt   . Diabetes Maternal Grandfather   . Heart disease Sister   . Diabetes Sister   . Throat cancer Son   . Breast cancer Niece   . Breast cancer Niece   . Amblyopia Neg Hx   . Blindness Neg Hx   . Retinal detachment Neg Hx   . Strabismus Neg Hx   . Retinitis pigmentosa Neg Hx     CURRENT MEDICATIONS:  Outpatient Encounter Medications as of 03/02/2019  Medication Sig Note  . [START ON 03/06/2019] fluorouracil CALGB 60454 in sodium chloride 0.9 % 150 mL Inject into the vein over 96 hr. Day 1 and Day 28   . gabapentin (NEURONTIN) 100 MG capsule Take 100 mg by mouth 3 (three) times daily.   . INVOKANA 300 MG TABS tablet Take 300 mg by mouth daily.   Marland Kitchen lisinopril (ZESTRIL) 20 MG tablet Take 20 mg by mouth daily.   Derrill Memo ON 03/06/2019] MITOMYCIN IV Inject into the vein. Day 1 and Day 28   . nabumetone (RELAFEN) 750 MG tablet Take 750 mg by mouth 2 (two) times daily.   . simvastatin (ZOCOR) 40 MG tablet Take 40 mg by mouth daily.    . traZODone (DESYREL) 50 MG tablet Take 50 mg by mouth at bedtime.   Tyler Aas FLEXTOUCH 200 UNIT/ML SOPN Inject 76 Units into the skin every morning.    . TRINTELLIX 10 MG TABS tablet Take 10 mg by mouth daily.   . TRULICITY [id number] 0000000 SOPN Inject 0.75 mg into the muscle every Sunday.    Marland Kitchen acetaminophen (TYLENOL) 500 MG tablet Take 1,000 mg by mouth every 8 (eight) hours as needed (pain).   Marland Kitchen ALPRAZolam (XANAX) 0.5 MG tablet Take 1 tablet (0.5 mg total) by mouth 2  (two) times daily as needed for anxiety.   . docusate sodium (COLACE) 100 MG capsule Take 1 capsule (100 mg total) by mouth 2 (two) times daily. (Patient taking differently: Take 100 mg by mouth at bedtime. ) 02/01/2019: OTC   . HYDROcodone-acetaminophen (NORCO) 5-325 MG tablet Take 1 tablet by mouth 2 (two) times daily as  needed for moderate pain. (Patient not taking: Reported on 03/02/2019)   . Lidocaine, Anorectal, 5 % GEL Apply 1 application topically 4 (four) times daily as needed. (Patient not taking: Reported on 03/02/2019)   . lidocaine-prilocaine (EMLA) cream Apply a small amount to port a cath site and cover with plastic wrap 1 hour prior to chemotherapy (Patient not taking: Reported on 03/02/2019)   . prochlorperazine (COMPAZINE) 10 MG tablet Take 1 tablet (10 mg total) by mouth every 6 (six) hours as needed for nausea or vomiting. (Patient not taking: Reported on 03/02/2019)   . silver sulfADIAZINE (SILVADENE) 1 % cream Apply to affected area daily (Patient not taking: Reported on 03/02/2019)   . [DISCONTINUED] oxyCODONE (ROXICODONE) 5 MG immediate release tablet Take 1-2 tablets (5-10 mg total) by mouth every 4 (four) hours as needed. (Patient not taking: Reported on 02/14/2019)    No facility-administered encounter medications on file as of 03/02/2019.     ALLERGIES:  Allergies  Allergen Reactions  . Sulfa Antibiotics Anaphylaxis    Per pt, can use creams with sulfa in it  . Sulfamethoxazole Other (See Comments)    Tongue swelling     PHYSICAL EXAM:  ECOG Performance status: 1  Vitals:   03/02/19 0928  BP: (!) 123/49  Pulse: 78  Resp: 14  Temp: 97.9 F (36.6 C)  SpO2: 100%   Filed Weights   03/02/19 0928  Weight: 211 lb 8 oz (95.9 kg)    Physical Exam Vitals signs reviewed.  Constitutional:      Appearance: Normal appearance.  Cardiovascular:     Rate and Rhythm: Normal rate and regular rhythm.     Heart sounds: Normal heart sounds.  Pulmonary:     Effort:  Pulmonary effort is normal.     Breath sounds: Normal breath sounds.  Abdominal:     General: There is no distension.     Palpations: Abdomen is soft. There is no mass.  Musculoskeletal:        General: No swelling.  Skin:    General: Skin is warm.  Neurological:     General: No focal deficit present.     Mental Status: She is alert and oriented to person, place, and time.  Psychiatric:        Mood and Affect: Mood normal.        Behavior: Behavior normal.      LABORATORY DATA:  I have reviewed the labs as listed.  CBC    Component Value Date/Time   WBC 6.2 03/02/2019 0850   RBC 4.42 03/02/2019 0850   HGB 13.0 03/02/2019 0850   HCT 41.7 03/02/2019 0850   PLT 265 03/02/2019 0850   MCV 94.3 03/02/2019 0850   MCH 29.4 03/02/2019 0850   MCHC 31.2 03/02/2019 0850   RDW 13.1 03/02/2019 0850   LYMPHSABS 1.0 03/02/2019 0850   MONOABS 0.5 03/02/2019 0850   EOSABS 0.2 03/02/2019 0850   BASOSABS 0.1 03/02/2019 0850   CMP Latest Ref Rng & Units 03/02/2019 01/25/2019  Glucose 70 - 99 mg/dL 126(H) 125(H)  BUN 8 - 23 mg/dL 27(H) 36(H)  Creatinine 0.44 - 1.00 mg/dL 0.95 0.92  Sodium 135 - 145 mmol/L 140 137  Potassium 3.5 - 5.1 mmol/L 4.1 4.2  Chloride 98 - 111 mmol/L 107 104  CO2 22 - 32 mmol/L 24 25  Calcium 8.9 - 10.3 mg/dL 8.8(L) 9.0  Total Protein 6.5 - 8.1 g/dL 6.8 -  Total Bilirubin 0.3 - 1.2 mg/dL 0.6 -  Alkaline Phos 38 - 126 U/L 54 -  AST 15 - 41 U/L 17 -  ALT 0 - 44 U/L 18 -       DIAGNOSTIC IMAGING:  I have independently reviewed the scans and discussed with the patient.     ASSESSMENT & PLAN:   Anal cancer ([place]) 1.  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  Because of COVID-19, she did not seek medical attention.  She used OTC creams like Monistat. -She was evaluated by Dr. Constance Haw, biopsy of the internal anal mass and perianal ulceration on 01/26/2019. - Biopsy showed squamous  cell carcinoma of both areas. - Physical examination showed ulcerated lesions around the anal orifice, predominantly on the right buttock cheek and left buttock cheek (right more than left).  There is a mass in the lower part of the anal canal towards the right side.  P 16 stain was requested. 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  Small left axillary lymph node measuring 4 mm with SUV of 3.2.  High left subpectoral lymph node 3 mm with SUV of 3.1. -Biopsy of the left axillary lymph node was not possible as it was too small. -She started radiation therapy on 02/27/2019. -Today we talked about chemotherapy with 5-FU and mitomycin.  We discussed side effects including HUS, hand-foot skin reaction, cytopenias. -She will be seen back next Friday for toxicity assessment. -I will start her on Xanax 0.5 mg twice daily as needed for anxiety.  2.  Perianal pain: -She is taking hydrocodone 5 mg as needed.  Pain has slightly improved since start of radiation.  3.  Diabetes: -She is on Tresiba 76 units in the morning and Invokana 300 mg daily. 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   Total time spent is 40 minutes with more than 50% of the time spent face-to-face discussing treatment plan, side effects, counseling and coordination of care.  Orders placed this encounter:  Orders Placed This Encounter  Procedures  . CBC with Differential/Platelet  . Comprehensive metabolic panel  . Magnesium      Derek Jack, MD [place] [phone]

## 2019-03-02 NOTE — Assessment & Plan Note (Addendum)
1.  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  Because of COVID-19, she did not seek medical attention.  She used OTC creams like Monistat. -She was evaluated by Dr. Constance Haw, biopsy of the internal anal mass and perianal ulceration on 01/26/2019. - Biopsy showed squamous cell carcinoma of both areas. - Physical examination showed ulcerated lesions around the anal orifice, predominantly on the right buttock cheek and left buttock cheek (right more than left).  There is a mass in the lower part of the anal canal towards the right side.  P 16 stain was requested. 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  Small left axillary lymph node measuring 4 mm with SUV of 3.2.  High left subpectoral lymph node 3 mm with SUV of 3.1. -Biopsy of the left axillary lymph node was not possible as it was too small. -She started radiation therapy on 02/27/2019. -Today we talked about chemotherapy with 5-FU and mitomycin.  We discussed side effects including HUS, hand-foot skin reaction, cytopenias. -She will be seen back next Friday for toxicity assessment. -I will start her on Xanax 0.5 mg twice daily as needed for anxiety.  2.  Perianal pain: -She is taking hydrocodone 5 mg as needed.  Pain has slightly improved since start of radiation.  3.  Diabetes: -She is on Tresiba 76 units in the morning and Invokana 300 mg daily. 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

## 2019-03-02 NOTE — Progress Notes (Signed)
START ON PATHWAY REGIMEN - Anal Carcinoma     Chemotherapy concurrent with RT:     Mitomycin      Fluorouracil   **Always confirm dose/schedule in your pharmacy ordering system**  Patient Characteristics: Anal and Anal Margin Tumors, Newly Diagnosed - Locoregional Disease Not Amenable to Local Excision AJCC T Category: T3 AJCC N Category: N1c AJCC M Category: M0 AJCC 8 Stage Grouping: IIIC Current Disease Status: Newly Diagnosed - Locoregional Disease Not Amendable to Local Excision Intent of Therapy: Curative Intent, Discussed with Patient

## 2019-03-02 NOTE — Patient Instructions (Signed)
[place] at [facility] Discharge Instructions  You were seen today by Dr. Delton Coombes. He went over your recent lab results. He will start your treatment tomorrow. He will see you back next Friday for labs and follow up.   Thank you for choosing [place] at [facility] to provide your oncology and hematology care.  To afford each patient quality time with our provider, please arrive at least 15 minutes before your scheduled appointment time.   If you have a lab appointment with the [facility] please come in thru the  Main Entrance and check in at the main information desk  You need to re-schedule your appointment should you arrive 10 or more minutes late.  We strive to give you quality time with our providers, and arriving late affects you and other patients whose appointments are after yours.  Also, if you no show three or more times for appointments you may be dismissed from the clinic at the providers discretion.     Again, thank you for choosing [facility].  Our hope is that these requests will decrease the amount of time that you wait before being seen by our physicians.       _____________________________________________________________  Should you have questions after your visit to [facility], please contact our office at [phone] between the hours of 8:00 a.m. and 4:30 p.m.  Voicemails left after 4:00 p.m. will not be returned until the following business day.  For prescription refill requests, have your pharmacy contact our office and allow 72 hours.    Cancer Center Support Programs:   > Cancer Support Group  2nd Tuesday of the month 1pm-2pm, Journey Room

## 2019-03-03 ENCOUNTER — Other Ambulatory Visit: Payer: Self-pay

## 2019-03-03 ENCOUNTER — Ambulatory Visit
Admission: RE | Admit: 2019-03-03 | Discharge: 2019-03-03 | Disposition: A | Discharge status: Home / Self Care | Payer: Medicare HMO | Source: Ambulatory Visit | Attending: Radiation Oncology | Admitting: Radiation Oncology

## 2019-03-03 ENCOUNTER — Inpatient Hospital Stay (HOSPITAL_COMMUNITY): Payer: Medicare HMO

## 2019-03-03 VITALS — BP 137/49 | HR 78 | Temp 96.9°F | Resp 18

## 2019-03-03 DIAGNOSIS — C210 Malignant neoplasm of anus, unspecified: Secondary | ICD-10-CM

## 2019-03-03 DIAGNOSIS — Z5111 Encounter for antineoplastic chemotherapy: Secondary | ICD-10-CM | POA: Diagnosis not present

## 2019-03-03 DIAGNOSIS — Z510 Encounter for antineoplastic radiation therapy: Secondary | ICD-10-CM | POA: Diagnosis not present

## 2019-03-03 MED ORDER — MITOMYCIN CHEMO IV INJECTION 40 MG
9.7000 mg/m2 | Freq: Once | INTRAVENOUS | Status: AC
  Administered 2019-03-03: 13:00:00 20 mg via INTRAVENOUS
  Filled 2019-03-03: qty 40

## 2019-03-03 MED ORDER — SODIUM CHLORIDE 0.9% FLUSH
10.0000 mL | INTRAVENOUS | Status: DC | PRN
  Administered 2019-03-03: 10 mL
  Filled 2019-03-03: qty 10

## 2019-03-03 MED ORDER — SODIUM CHLORIDE 0.9 % IV SOLN
1000.0000 mg/m2/d | INTRAVENOUS | Status: AC
  Administered 2019-03-03: 8300 mg via INTRAVENOUS
  Filled 2019-03-03: qty 166

## 2019-03-03 MED ORDER — HEPARIN SOD (PORK) LOCK FLUSH 100 UNIT/ML IV SOLN: 500.0000 [IU] | Freq: Once | INTRAVENOUS | Status: DC | PRN

## 2019-03-03 MED ORDER — PROCHLORPERAZINE MALEATE 10 MG PO TABS
10.0000 mg | ORAL_TABLET | Freq: Once | ORAL | Status: AC
  Administered 2019-03-03: 10 mg via ORAL
  Filled 2019-03-03: qty 1

## 2019-03-03 MED ORDER — SODIUM CHLORIDE 0.9 % IV SOLN
Freq: Once | INTRAVENOUS | Status: AC
  Administered 2019-03-03: 12:00:00 via INTRAVENOUS

## 2019-03-06 ENCOUNTER — Other Ambulatory Visit (HOSPITAL_COMMUNITY): Payer: Medicare HMO

## 2019-03-06 ENCOUNTER — Other Ambulatory Visit: Payer: Self-pay

## 2019-03-06 ENCOUNTER — Ambulatory Visit
Admission: RE | Admit: 2019-03-06 | Discharge: 2019-03-06 | Disposition: A | Discharge status: Home / Self Care | Payer: Medicare HMO | Source: Ambulatory Visit | Attending: Radiation Oncology | Admitting: Radiation Oncology

## 2019-03-06 ENCOUNTER — Ambulatory Visit (HOSPITAL_COMMUNITY): Payer: Medicare HMO

## 2019-03-06 DIAGNOSIS — Z510 Encounter for antineoplastic radiation therapy: Secondary | ICD-10-CM | POA: Diagnosis not present

## 2019-03-06 NOTE — Progress Notes (Addendum)
Patient states she was getting down off the treatment machine and missed the step stool. States she only has one hand so was unable to stop herself. Did not lose consciousness. Denies headache or any pain to other areas. Spoke with Shona Simpson [facility] patient is ok to go home. Instructed if she develops nausea and vomiting, headache weakness on one side to immediately go to the ED. Patient verbalized understanding. Her physical assessment was negative she was alert and oriented. Allowed to go home BP (!) 131/58 (BP Location: Right Arm, Patient Position: Sitting)   Pulse 83   Resp 18   SpO2 100%

## 2019-03-07 ENCOUNTER — Ambulatory Visit
Admission: RE | Admit: 2019-03-07 | Discharge: 2019-03-07 | Disposition: A | Discharge status: Home / Self Care | Payer: Medicare HMO | Source: Ambulatory Visit | Attending: Radiation Oncology | Admitting: Radiation Oncology

## 2019-03-07 ENCOUNTER — Inpatient Hospital Stay (HOSPITAL_COMMUNITY): Payer: Medicare HMO

## 2019-03-07 ENCOUNTER — Other Ambulatory Visit: Payer: Self-pay

## 2019-03-07 VITALS — BP 135/58 | HR 89 | Temp 97.1°F | Resp 18

## 2019-03-07 DIAGNOSIS — Z5111 Encounter for antineoplastic chemotherapy: Secondary | ICD-10-CM | POA: Diagnosis not present

## 2019-03-07 DIAGNOSIS — C210 Malignant neoplasm of anus, unspecified: Secondary | ICD-10-CM

## 2019-03-07 DIAGNOSIS — Z510 Encounter for antineoplastic radiation therapy: Secondary | ICD-10-CM | POA: Diagnosis not present

## 2019-03-07 MED ORDER — SODIUM CHLORIDE 0.9% FLUSH
10.0000 mL | INTRAVENOUS | Status: DC | PRN
  Administered 2019-03-07: 10 mL
  Filled 2019-03-07: qty 10

## 2019-03-07 MED ORDER — HEPARIN SOD (PORK) LOCK FLUSH 100 UNIT/ML IV SOLN
500.0000 [IU] | Freq: Once | INTRAVENOUS | Status: AC | PRN
  Administered 2019-03-07: 500 [IU]

## 2019-03-07 NOTE — Patient Instructions (Signed)
[facility] Discharge Instructions for Patients Receiving Chemotherapy  Today you received the following chemotherapy agents   To help prevent nausea and vomiting after your treatment, we encourage you to take your nausea medication   If you develop nausea and vomiting that is not controlled by your nausea medication, call the clinic.   BELOW ARE SYMPTOMS THAT SHOULD BE REPORTED IMMEDIATELY:  *FEVER GREATER THAN 100.5 F  *CHILLS WITH OR WITHOUT FEVER  NAUSEA AND VOMITING THAT IS NOT CONTROLLED WITH YOUR NAUSEA MEDICATION  *UNUSUAL SHORTNESS OF BREATH  *UNUSUAL BRUISING OR BLEEDING  TENDERNESS IN MOUTH AND THROAT WITH OR WITHOUT PRESENCE OF ULCERS  *URINARY PROBLEMS  *BOWEL PROBLEMS  UNUSUAL RASH Items with * indicate a potential emergency and should be followed up as soon as possible.  Feel free to call the clinic should you have any questions or concerns. The clinic phone number is [phone].  Please show the CHEMO ALERT CARD at check-in to the Emergency Department and triage nurse.   

## 2019-03-07 NOTE — Progress Notes (Signed)
Pt presents today for pump d/c. VSS. Pt has no complaints of any pain or changes since the last visit. MAR reviewed. Pt complaints of slight diarrhea yesterday that resolved with Imodium  Pump d/c and port flush today per MD orders. Tolerated infusion without adverse affects. Vital signs stable. No complaints at this time. Discharged from clinic ambulatory. F/U with [facility] as scheduled. Jacqueline Bird

## 2019-03-08 ENCOUNTER — Ambulatory Visit
Admission: RE | Admit: 2019-03-08 | Discharge: 2019-03-08 | Disposition: A | Discharge status: Home / Self Care | Payer: Medicare HMO | Source: Ambulatory Visit | Attending: Radiation Oncology | Admitting: Radiation Oncology

## 2019-03-08 ENCOUNTER — Other Ambulatory Visit: Payer: Self-pay

## 2019-03-08 DIAGNOSIS — Z510 Encounter for antineoplastic radiation therapy: Secondary | ICD-10-CM | POA: Diagnosis not present

## 2019-03-09 ENCOUNTER — Other Ambulatory Visit: Payer: Self-pay

## 2019-03-09 ENCOUNTER — Ambulatory Visit
Admission: RE | Admit: 2019-03-09 | Discharge: 2019-03-09 | Disposition: A | Discharge status: Home / Self Care | Payer: Medicare HMO | Source: Ambulatory Visit | Attending: Radiation Oncology | Admitting: Radiation Oncology

## 2019-03-09 DIAGNOSIS — Z510 Encounter for antineoplastic radiation therapy: Secondary | ICD-10-CM | POA: Diagnosis not present

## 2019-03-10 ENCOUNTER — Ambulatory Visit
Admission: RE | Admit: 2019-03-10 | Discharge: 2019-03-10 | Disposition: A | Discharge status: Home / Self Care | Payer: Medicare HMO | Source: Ambulatory Visit | Attending: Radiation Oncology | Admitting: Radiation Oncology

## 2019-03-10 ENCOUNTER — Encounter (HOSPITAL_COMMUNITY): Payer: Medicare HMO

## 2019-03-10 ENCOUNTER — Other Ambulatory Visit: Payer: Self-pay

## 2019-03-10 DIAGNOSIS — C210 Malignant neoplasm of anus, unspecified: Secondary | ICD-10-CM

## 2019-03-10 DIAGNOSIS — Z510 Encounter for antineoplastic radiation therapy: Secondary | ICD-10-CM | POA: Diagnosis not present

## 2019-03-10 MED ORDER — RADIAPLEXRX EX GEL
Freq: Once | CUTANEOUS | Status: AC
  Administered 2019-03-10: 14:00:00 via TOPICAL

## 2019-03-13 ENCOUNTER — Ambulatory Visit
Admission: RE | Admit: 2019-03-13 | Discharge: 2019-03-13 | Disposition: A | Discharge status: Home / Self Care | Payer: Medicare HMO | Source: Ambulatory Visit | Attending: Radiation Oncology | Admitting: Radiation Oncology

## 2019-03-13 ENCOUNTER — Other Ambulatory Visit: Payer: Self-pay

## 2019-03-13 DIAGNOSIS — Z510 Encounter for antineoplastic radiation therapy: Secondary | ICD-10-CM | POA: Diagnosis not present

## 2019-03-14 ENCOUNTER — Ambulatory Visit
Admission: RE | Admit: 2019-03-14 | Discharge: 2019-03-14 | Disposition: A | Discharge status: Home / Self Care | Payer: Medicare HMO | Source: Ambulatory Visit | Attending: Radiation Oncology | Admitting: Radiation Oncology

## 2019-03-14 ENCOUNTER — Other Ambulatory Visit: Payer: Self-pay

## 2019-03-14 DIAGNOSIS — Z510 Encounter for antineoplastic radiation therapy: Secondary | ICD-10-CM | POA: Diagnosis not present

## 2019-03-15 ENCOUNTER — Other Ambulatory Visit: Payer: Self-pay

## 2019-03-15 ENCOUNTER — Ambulatory Visit
Admission: RE | Admit: 2019-03-15 | Discharge: 2019-03-15 | Disposition: A | Discharge status: Home / Self Care | Payer: Medicare HMO | Source: Ambulatory Visit | Attending: Radiation Oncology | Admitting: Radiation Oncology

## 2019-03-15 DIAGNOSIS — Z510 Encounter for antineoplastic radiation therapy: Secondary | ICD-10-CM | POA: Diagnosis not present

## 2019-03-16 ENCOUNTER — Other Ambulatory Visit (HOSPITAL_COMMUNITY): Payer: Self-pay | Admitting: *Deleted

## 2019-03-16 ENCOUNTER — Ambulatory Visit
Admission: RE | Admit: 2019-03-16 | Discharge: 2019-03-16 | Disposition: A | Discharge status: Home / Self Care | Payer: Medicare HMO | Source: Ambulatory Visit | Attending: Radiation Oncology | Admitting: Radiation Oncology

## 2019-03-16 ENCOUNTER — Other Ambulatory Visit: Payer: Self-pay

## 2019-03-16 ENCOUNTER — Telehealth (HOSPITAL_COMMUNITY): Payer: Self-pay | Admitting: *Deleted

## 2019-03-16 DIAGNOSIS — Z510 Encounter for antineoplastic radiation therapy: Secondary | ICD-10-CM | POA: Diagnosis not present

## 2019-03-16 MED ORDER — HYDROCODONE-ACETAMINOPHEN 5-325 MG PO TABS: 1.0000 | ORAL_TABLET | Freq: Two times a day (BID) | ORAL | 0 refills | Status: DC | PRN

## 2019-03-17 ENCOUNTER — Other Ambulatory Visit: Payer: Self-pay

## 2019-03-17 ENCOUNTER — Ambulatory Visit
Admission: RE | Admit: 2019-03-17 | Discharge: 2019-03-17 | Disposition: A | Discharge status: Home / Self Care | Payer: Medicare HMO | Source: Ambulatory Visit | Attending: Radiation Oncology | Admitting: Radiation Oncology

## 2019-03-17 DIAGNOSIS — Z510 Encounter for antineoplastic radiation therapy: Secondary | ICD-10-CM | POA: Diagnosis not present

## 2019-03-20 ENCOUNTER — Ambulatory Visit
Admission: RE | Admit: 2019-03-20 | Discharge: 2019-03-20 | Disposition: A | Discharge status: Home / Self Care | Payer: Medicare HMO | Source: Ambulatory Visit | Attending: Radiation Oncology | Admitting: Radiation Oncology

## 2019-03-20 ENCOUNTER — Other Ambulatory Visit: Payer: Self-pay

## 2019-03-20 DIAGNOSIS — C774 Secondary and unspecified malignant neoplasm of inguinal and lower limb lymph nodes: Secondary | ICD-10-CM | POA: Insufficient documentation

## 2019-03-20 DIAGNOSIS — C210 Malignant neoplasm of anus, unspecified: Secondary | ICD-10-CM | POA: Insufficient documentation

## 2019-03-20 DIAGNOSIS — Z510 Encounter for antineoplastic radiation therapy: Secondary | ICD-10-CM | POA: Insufficient documentation

## 2019-03-21 ENCOUNTER — Other Ambulatory Visit: Payer: Self-pay

## 2019-03-21 ENCOUNTER — Ambulatory Visit
Admission: RE | Admit: 2019-03-21 | Discharge: 2019-03-21 | Disposition: A | Discharge status: Home / Self Care | Payer: Medicare HMO | Source: Ambulatory Visit | Attending: Radiation Oncology | Admitting: Radiation Oncology

## 2019-03-21 DIAGNOSIS — Z510 Encounter for antineoplastic radiation therapy: Secondary | ICD-10-CM | POA: Diagnosis not present

## 2019-03-22 ENCOUNTER — Other Ambulatory Visit: Payer: Self-pay

## 2019-03-22 ENCOUNTER — Ambulatory Visit
Admission: RE | Admit: 2019-03-22 | Discharge: 2019-03-22 | Disposition: A | Discharge status: Home / Self Care | Payer: Medicare HMO | Source: Ambulatory Visit | Attending: Radiation Oncology | Admitting: Radiation Oncology

## 2019-03-22 DIAGNOSIS — Z510 Encounter for antineoplastic radiation therapy: Secondary | ICD-10-CM | POA: Diagnosis not present

## 2019-03-23 ENCOUNTER — Ambulatory Visit
Admission: RE | Admit: 2019-03-23 | Discharge: 2019-03-23 | Disposition: A | Discharge status: Home / Self Care | Payer: Medicare HMO | Source: Ambulatory Visit | Attending: Radiation Oncology | Admitting: Radiation Oncology

## 2019-03-23 ENCOUNTER — Other Ambulatory Visit: Payer: Self-pay

## 2019-03-23 DIAGNOSIS — Z510 Encounter for antineoplastic radiation therapy: Secondary | ICD-10-CM | POA: Diagnosis not present

## 2019-03-24 ENCOUNTER — Ambulatory Visit
Admission: RE | Admit: 2019-03-24 | Discharge: 2019-03-24 | Disposition: A | Discharge status: Home / Self Care | Payer: Medicare HMO | Source: Ambulatory Visit | Attending: Radiation Oncology | Admitting: Radiation Oncology

## 2019-03-24 DIAGNOSIS — Z510 Encounter for antineoplastic radiation therapy: Secondary | ICD-10-CM | POA: Diagnosis not present

## 2019-03-27 ENCOUNTER — Ambulatory Visit
Admission: RE | Admit: 2019-03-27 | Discharge: 2019-03-27 | Disposition: A | Discharge status: Home / Self Care | Payer: Medicare HMO | Source: Ambulatory Visit | Attending: Radiation Oncology | Admitting: Radiation Oncology

## 2019-03-27 ENCOUNTER — Other Ambulatory Visit: Payer: Self-pay

## 2019-03-27 ENCOUNTER — Other Ambulatory Visit (HOSPITAL_COMMUNITY): Payer: Self-pay | Admitting: *Deleted

## 2019-03-27 DIAGNOSIS — Z510 Encounter for antineoplastic radiation therapy: Secondary | ICD-10-CM | POA: Diagnosis not present

## 2019-03-27 MED ORDER — HYDROCODONE-ACETAMINOPHEN 5-325 MG PO TABS: 1.0000 | ORAL_TABLET | Freq: Two times a day (BID) | ORAL | 0 refills | Status: DC | PRN

## 2019-03-27 MED ORDER — ALPRAZOLAM 0.5 MG PO TABS: 0.5000 mg | ORAL_TABLET | Freq: Two times a day (BID) | ORAL | 0 refills | Status: DC | PRN

## 2019-03-28 ENCOUNTER — Ambulatory Visit
Admission: RE | Admit: 2019-03-28 | Discharge: 2019-03-28 | Disposition: A | Discharge status: Home / Self Care | Payer: Medicare HMO | Source: Ambulatory Visit | Attending: Radiation Oncology | Admitting: Radiation Oncology

## 2019-03-28 ENCOUNTER — Other Ambulatory Visit: Payer: Self-pay

## 2019-03-28 DIAGNOSIS — Z510 Encounter for antineoplastic radiation therapy: Secondary | ICD-10-CM | POA: Diagnosis not present

## 2019-03-29 ENCOUNTER — Ambulatory Visit
Admission: RE | Admit: 2019-03-29 | Discharge: 2019-03-29 | Disposition: A | Discharge status: Home / Self Care | Payer: Medicare HMO | Source: Ambulatory Visit | Attending: Radiation Oncology | Admitting: Radiation Oncology

## 2019-03-29 ENCOUNTER — Other Ambulatory Visit: Payer: Self-pay

## 2019-03-29 DIAGNOSIS — Z510 Encounter for antineoplastic radiation therapy: Secondary | ICD-10-CM | POA: Diagnosis not present

## 2019-03-30 ENCOUNTER — Ambulatory Visit
Admission: RE | Admit: 2019-03-30 | Discharge: 2019-03-30 | Disposition: A | Discharge status: Home / Self Care | Payer: Medicare HMO | Source: Ambulatory Visit | Attending: Radiation Oncology | Admitting: Radiation Oncology

## 2019-03-30 ENCOUNTER — Other Ambulatory Visit: Payer: Self-pay

## 2019-03-30 DIAGNOSIS — Z510 Encounter for antineoplastic radiation therapy: Secondary | ICD-10-CM | POA: Diagnosis not present

## 2019-03-31 ENCOUNTER — Inpatient Hospital Stay (HOSPITAL_COMMUNITY): Payer: Medicare HMO

## 2019-03-31 ENCOUNTER — Inpatient Hospital Stay (HOSPITAL_BASED_OUTPATIENT_CLINIC_OR_DEPARTMENT_OTHER): Payer: Medicare HMO | Admitting: Hematology

## 2019-03-31 ENCOUNTER — Inpatient Hospital Stay (HOSPITAL_COMMUNITY): Payer: Medicare HMO | Attending: Hematology

## 2019-03-31 ENCOUNTER — Other Ambulatory Visit (HOSPITAL_COMMUNITY): Payer: Self-pay | Admitting: *Deleted

## 2019-03-31 ENCOUNTER — Ambulatory Visit
Admission: RE | Admit: 2019-03-31 | Discharge: 2019-03-31 | Disposition: A | Discharge status: Home / Self Care | Payer: Medicare HMO | Source: Ambulatory Visit | Attending: Radiation Oncology | Admitting: Radiation Oncology

## 2019-03-31 ENCOUNTER — Encounter (HOSPITAL_COMMUNITY): Payer: Self-pay | Admitting: Hematology

## 2019-03-31 ENCOUNTER — Other Ambulatory Visit: Payer: Self-pay

## 2019-03-31 VITALS — BP 130/55 | HR 85 | Temp 97.3°F | Resp 18 | Wt 201.6 lb

## 2019-03-31 DIAGNOSIS — I10 Essential (primary) hypertension: Secondary | ICD-10-CM | POA: Diagnosis not present

## 2019-03-31 DIAGNOSIS — E1140 Type 2 diabetes mellitus with diabetic neuropathy, unspecified: Secondary | ICD-10-CM | POA: Diagnosis not present

## 2019-03-31 DIAGNOSIS — Z5111 Encounter for antineoplastic chemotherapy: Secondary | ICD-10-CM | POA: Insufficient documentation

## 2019-03-31 DIAGNOSIS — F329 Major depressive disorder, single episode, unspecified: Secondary | ICD-10-CM | POA: Diagnosis not present

## 2019-03-31 DIAGNOSIS — C210 Malignant neoplasm of anus, unspecified: Secondary | ICD-10-CM

## 2019-03-31 DIAGNOSIS — R112 Nausea with vomiting, unspecified: Secondary | ICD-10-CM | POA: Insufficient documentation

## 2019-03-31 DIAGNOSIS — R197 Diarrhea, unspecified: Secondary | ICD-10-CM | POA: Insufficient documentation

## 2019-03-31 DIAGNOSIS — C775 Secondary and unspecified malignant neoplasm of intrapelvic lymph nodes: Secondary | ICD-10-CM | POA: Insufficient documentation

## 2019-03-31 DIAGNOSIS — Z79899 Other long term (current) drug therapy: Secondary | ICD-10-CM | POA: Diagnosis not present

## 2019-03-31 DIAGNOSIS — Z510 Encounter for antineoplastic radiation therapy: Secondary | ICD-10-CM | POA: Diagnosis not present

## 2019-03-31 LAB — CBC WITH DIFFERENTIAL/PLATELET
Abs Immature Granulocytes: 0.03 10*3/uL (ref 0.00–0.07)
Basophils Absolute: 0 10*3/uL (ref 0.0–0.1)
Basophils Relative: 1 %
Eosinophils Absolute: 0.2 10*3/uL (ref 0.0–0.5)
Eosinophils Relative: 7 %
HCT: 32.9 % — ABNORMAL LOW (ref 36.0–46.0)
Hemoglobin: 10.3 g/dL — ABNORMAL LOW (ref 12.0–15.0)
Immature Granulocytes: 1 %
Lymphocytes Relative: 6 %
Lymphs Abs: 0.2 10*3/uL — ABNORMAL LOW (ref 0.7–4.0)
MCH: 28.9 pg (ref 26.0–34.0)
MCHC: 31.3 g/dL (ref 30.0–36.0)
MCV: 92.4 fL (ref 80.0–100.0)
Monocytes Absolute: 0.6 10*3/uL (ref 0.1–1.0)
Monocytes Relative: 15 %
Neutro Abs: 2.6 10*3/uL (ref 1.7–7.7)
Neutrophils Relative %: 70 %
Platelets: 447 10*3/uL — ABNORMAL HIGH (ref 150–400)
RBC: 3.56 MIL/uL — ABNORMAL LOW (ref 3.87–5.11)
RDW: 14.8 % (ref 11.5–15.5)
WBC: 3.7 10*3/uL — ABNORMAL LOW (ref 4.0–10.5)
nRBC: 0 % (ref 0.0–0.2)

## 2019-03-31 LAB — COMPREHENSIVE METABOLIC PANEL
ALT: 16 U/L (ref 0–44)
AST: 17 U/L (ref 15–41)
Albumin: 2.8 g/dL — ABNORMAL LOW (ref 3.5–5.0)
Alkaline Phosphatase: 41 U/L (ref 38–126)
Anion gap: 9 (ref 5–15)
BUN: 33 mg/dL — ABNORMAL HIGH (ref 8–23)
CO2: 23 mmol/L (ref 22–32)
Calcium: 8.8 mg/dL — ABNORMAL LOW (ref 8.9–10.3)
Chloride: 104 mmol/L (ref 98–111)
Creatinine, Ser: 0.77 mg/dL (ref 0.44–1.00)
GFR calc Af Amer: >60 mL/min (ref >60)
GFR calc non Af Amer: >60 mL/min (ref >60)
Glucose, Bld: 128 mg/dL — ABNORMAL HIGH (ref 70–99)
Potassium: 3.7 mmol/L (ref 3.5–5.1)
Sodium: 136 mmol/L (ref 135–145)
Total Bilirubin: 0.6 mg/dL (ref 0.3–1.2)
Total Protein: 6.6 g/dL (ref 6.5–8.1)

## 2019-03-31 LAB — MAGNESIUM: Magnesium: 2.2 mg/dL (ref 1.7–2.4)

## 2019-03-31 MED ORDER — HEPARIN SOD (PORK) LOCK FLUSH 100 UNIT/ML IV SOLN: 500.0000 [IU] | Freq: Once | INTRAVENOUS | Status: DC | PRN

## 2019-03-31 MED ORDER — SODIUM CHLORIDE 0.9% FLUSH
10.0000 mL | INTRAVENOUS | Status: DC | PRN
  Administered 2019-03-31: 10 mL
  Filled 2019-03-31: qty 10

## 2019-03-31 MED ORDER — PROCHLORPERAZINE MALEATE 10 MG PO TABS
ORAL_TABLET | ORAL | Status: AC
  Filled 2019-03-31: qty 1

## 2019-03-31 MED ORDER — SODIUM CHLORIDE 0.9 % IV SOLN
1000.0000 mg/m2/d | INTRAVENOUS | Status: DC
  Administered 2019-03-31: 8300 mg via INTRAVENOUS
  Filled 2019-03-31: qty 100

## 2019-03-31 MED ORDER — PROCHLORPERAZINE MALEATE 10 MG PO TABS
10.0000 mg | ORAL_TABLET | Freq: Once | ORAL | Status: AC
  Administered 2019-03-31: 10 mg via ORAL

## 2019-03-31 MED ORDER — MITOMYCIN CHEMO IV INJECTION 20 MG
8.0000 mg/m2 | Freq: Once | INTRAVENOUS | Status: AC
  Administered 2019-03-31: 16.5 mg via INTRAVENOUS
  Filled 2019-03-31: qty 33

## 2019-03-31 MED ORDER — SODIUM CHLORIDE 0.9 % IV SOLN
Freq: Once | INTRAVENOUS | Status: AC
  Administered 2019-03-31: 15:00:00 via INTRAVENOUS

## 2019-03-31 MED ORDER — HYDROCODONE-ACETAMINOPHEN 10-325 MG PO TABS: 1.0000 | ORAL_TABLET | Freq: Two times a day (BID) | ORAL | 0 refills | Status: DC

## 2019-03-31 NOTE — Patient Instructions (Signed)
[facility] Discharge Instructions for Patients Receiving Chemotherapy  Today you received the following chemotherapy agents   To help prevent nausea and vomiting after your treatment, we encourage you to take your nausea medication   If you develop nausea and vomiting that is not controlled by your nausea medication, call the clinic.   BELOW ARE SYMPTOMS THAT SHOULD BE REPORTED IMMEDIATELY:  *FEVER GREATER THAN 100.5 F  *CHILLS WITH OR WITHOUT FEVER  NAUSEA AND VOMITING THAT IS NOT CONTROLLED WITH YOUR NAUSEA MEDICATION  *UNUSUAL SHORTNESS OF BREATH  *UNUSUAL BRUISING OR BLEEDING  TENDERNESS IN MOUTH AND THROAT WITH OR WITHOUT PRESENCE OF ULCERS  *URINARY PROBLEMS  *BOWEL PROBLEMS  UNUSUAL RASH Items with * indicate a potential emergency and should be followed up as soon as possible.  Feel free to call the clinic should you have any questions or concerns. The clinic phone number is [phone].  Please show the CHEMO ALERT CARD at check-in to the Emergency Department and triage nurse.   

## 2019-03-31 NOTE — Progress Notes (Signed)
Labs reviewed with MD. Proceed with treatment per MD. Dose reduction on mitomycin noted.   Continuous 5FU pump administered per orders.    Treatment given per orders. Patient tolerated it well without problems. Vitals stable and discharged home from clinic ambulatory. Follow up as scheduled.

## 2019-03-31 NOTE — Progress Notes (Signed)
Jacqueline Bird, Jacqueline Bird [id number]   CLINIC:  Medical Oncology/Hematology  PCP:  Jacqueline Bird, [place] [id number] [phone]   REASON FOR VISIT:  Follow-up for anal cancer.  CURRENT THERAPY: Chemoradiation therapy.  BRIEF ONCOLOGIC HISTORY:  Oncology History  Anal cancer ([place])  02/01/2019 Initial Diagnosis   Anal cancer ([place])   02/16/2019 Cancer Staging   Staging form: Anus, AJCC 8th Edition - Clinical: Stage IIIC (cT3, cN1c, cM0) - Signed by Kyung Rudd, MD on 02/16/2019   03/03/2019 -  Chemotherapy   The patient had mitoMYcin (MUTAMYCIN) chemo injection 20 mg, 9.7 mg/m2 = 21 mg, Intravenous,  Once, 1 of 1 cycle Dose modification: 8 mg/m2 (original dose 10 mg/m2, Cycle 1, Reason: Provider Judgment) Administration: 20 mg (03/03/2019) fluorouracil (ADRUCIL) 8,300 mg in sodium chloride 0.9 % 84 mL chemo infusion, 1,000 mg/m2/day = 8,300 mg, Intravenous, 4D (96 hours ), 1 of 1 cycle Administration: 8,300 mg (03/03/2019)  for chemotherapy treatment.       CANCER STAGING: Cancer Staging Anal cancer ([place]) Staging form: Anus, AJCC 8th Edition - Clinical: Stage IIIC (cT3, cN1c, cM0) - Signed by Kyung Rudd, MD on 02/16/2019    INTERVAL HISTORY:  [name] 68 y.o. female seen for follow-up of anal cancer and toxicity assessment for day 28 of chemotherapy.  Day 1 of chemotherapy, she experienced nausea and vomiting which lasted 7 to 10 days.  She missed her follow-up appointment on day 7.  She is continuing to do radiation therapy.  Appetite is 75%.  Energy levels are 50%.  She is taking hydrocodone 5/325 1 tablet daily.  She decreased the dose as she was running out of the pills.  She reports pain is not well controlled.  She is taking Imodium every other day for her diarrhea.    REVIEW OF SYSTEMS:  Review of Systems  Gastrointestinal: Positive for diarrhea, nausea and vomiting.       Pain in the anal region.   Neurological: Positive for numbness.  Psychiatric/Behavioral: The patient is nervous/anxious.   All other systems reviewed and are negative.    PAST MEDICAL/SURGICAL HISTORY:  Past Medical History:  Diagnosis Date  . 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 Cervical cancer ([place])   . Depression   . Diabetes mellitus without complication ([place])   . History of cervical cancer   . Hypertension    Past Surgical History:  Procedure Laterality Date  . CATARACT EXTRACTION, BILATERAL Right 05/2017  . CERVICAL CONE BIOPSY    . HAND AMPUTATION Left   . PORTACATH PLACEMENT Left 02/08/2019   Procedure: INSERTION PORT-A-CATH;  Surgeon: Virl Cagey, MD;  Location: [facility];  Service: General;  Laterality: Left;  . RECTAL BIOPSY  01/26/2019   Procedure: BIOPSY OF ANAL MASS;  Surgeon: Virl Cagey, MD;  Location: [facility];  Service: General;;  . RECTAL EXAM UNDER ANESTHESIA N/A 01/26/2019   Procedure: RECTAL EXAM UNDER ANESTHESIA;  Surgeon: Virl Cagey, MD;  Location: [facility];  Service: General;  Laterality: N/A;     SOCIAL HISTORY:  Social History   Socioeconomic History  . Marital status: Single    Spouse name: Not on file  . Number of children: 2  . Years of education: Not on file  . Highest education level: Not on file  Occupational History  . Occupation: retired  Scientific laboratory technician  . Financial resource strain: Somewhat hard  .  Food insecurity    Worry: Never true    Inability: Never true  . Transportation needs    Medical: No    Non-medical: No  Tobacco Use  . Smoking status: Never Smoker  . Smokeless tobacco: Never Used  Substance and Sexual Activity  . Alcohol use: Not Currently    Frequency: Never  . Drug use: Never  . Sexual activity: Not on file  Lifestyle  . Physical activity    Days per week: 7 days    Minutes per session: 60 min  . Stress: To some extent  Relationships  . Social Herbalist on phone: Three times a week    Gets together:  Three times a week    Attends religious service: 1 to 4 times per year    Active member of club or organization: Yes    Attends meetings of clubs or organizations: 1 to 4 times per year    Relationship status: Separated  . Intimate partner violence    Fear of current or ex partner: No    Emotionally abused: No    Physically abused: No    Forced sexual activity: No  Other Topics Concern  . Not on file  Social History Narrative   Pt is retired and currently engaged.    FAMILY HISTORY:  Family History  Problem Relation Age of Onset  . Cataracts Mother   . Glaucoma Mother   . Heart disease Mother   . Diabetes Father   . Heart attack Father   . Cataracts Sister   . Lung disease Sister   . Cataracts Brother   . Diabetes Brother   . Heart disease Brother   . Macular degeneration Maternal Aunt   . Diabetes Maternal Grandfather   . Heart disease Sister   . Diabetes Sister   . Throat cancer Son   . Breast cancer Niece   . Breast cancer Niece   . Amblyopia Neg Hx   . Blindness Neg Hx   . Retinal detachment Neg Hx   . Strabismus Neg Hx   . Retinitis pigmentosa Neg Hx     CURRENT MEDICATIONS:  Outpatient Encounter Medications as of 03/31/2019  Medication Sig Note  . acetaminophen (TYLENOL) 500 MG tablet Take 1,000 mg by mouth every 8 (eight) hours as needed (pain).   Marland Kitchen ALPRAZolam (XANAX) 0.5 MG tablet Take 1 tablet (0.5 mg total) by mouth 2 (two) times daily as needed for anxiety.   . docusate sodium (COLACE) 100 MG capsule Take 1 capsule (100 mg total) by mouth 2 (two) times daily. (Patient taking differently: Take 100 mg by mouth at bedtime. ) 02/01/2019: OTC   . fluorouracil CALGB 96295 in sodium chloride 0.9 % 150 mL Inject into the vein over 96 hr. Day 1 and Day 28   . gabapentin (NEURONTIN) 100 MG capsule Take 100 mg by mouth 3 (three) times daily.   . INVOKANA 300 MG TABS tablet Take 300 mg by mouth daily.   . Lidocaine, Anorectal, 5 % GEL Apply 1 application topically 4  (four) times daily as needed.   . lidocaine-prilocaine (EMLA) cream Apply a small amount to port a cath site and cover with plastic wrap 1 hour prior to chemotherapy   . lisinopril (ZESTRIL) 20 MG tablet Take 20 mg by mouth daily.   Marland Kitchen MITOMYCIN IV Inject into the vein. Day 1 and Day 28   . nabumetone (RELAFEN) 750 MG tablet Take 750 mg by mouth 2 (  two) times daily.   . prochlorperazine (COMPAZINE) 10 MG tablet Take 1 tablet (10 mg total) by mouth every 6 (six) hours as needed for nausea or vomiting.   . silver sulfADIAZINE (SILVADENE) 1 % cream Apply to affected area daily   . simvastatin (ZOCOR) 40 MG tablet Take 40 mg by mouth daily.    . traZODone (DESYREL) 50 MG tablet Take 50 mg by mouth at bedtime.   Tyler Aas FLEXTOUCH 200 UNIT/ML SOPN Inject 76 Units into the skin every morning.    . TRINTELLIX 10 MG TABS tablet Take 10 mg by mouth daily.   . TRULICITY [id number] 0000000 SOPN Inject 0.75 mg into the muscle every Sunday.    . [DISCONTINUED] HYDROcodone-acetaminophen (NORCO) 5-325 MG tablet Take 1 tablet by mouth 2 (two) times daily as needed for moderate pain.    Facility-Administered Encounter Medications as of 03/31/2019  Medication  . heparin lock flush 100 unit/mL  . sodium chloride flush (NS) 0.9 % injection 10 mL    ALLERGIES:  Allergies  Allergen Reactions  . Sulfa Antibiotics Anaphylaxis    Per pt, can use creams with sulfa in it  . Sulfamethoxazole Other (See Comments)    Tongue swelling     PHYSICAL EXAM:  ECOG Performance status: 1 I reviewed her vitals.  Blood pressure is 122/51.  Pulse is 80.  Respirations 18.  Temperature 98.  Oxygen saturation is 100%. There were no vitals filed for this visit. There were no vitals filed for this visit.  Physical Exam Vitals signs reviewed.  Constitutional:      Appearance: Normal appearance.  Cardiovascular:     Rate and Rhythm: Normal rate and regular rhythm.     Heart sounds: Normal heart sounds.  Pulmonary:     Effort:  Pulmonary effort is normal.     Breath sounds: Normal breath sounds.  Abdominal:     General: There is no distension.     Palpations: Abdomen is soft. There is no mass.  Musculoskeletal:        General: No swelling.  Skin:    General: Skin is warm.  Neurological:     General: No focal deficit present.     Mental Status: She is alert and oriented to person, place, and time.  Psychiatric:        Mood and Affect: Mood normal.        Behavior: Behavior normal.      LABORATORY DATA:  I have reviewed the labs as listed.  CBC    Component Value Date/Time   WBC 3.7 (L) 03/31/2019 1256   RBC 3.56 (L) 03/31/2019 1256   HGB 10.3 (L) 03/31/2019 1256   HCT 32.9 (L) 03/31/2019 1256   PLT 447 (H) 03/31/2019 1256   MCV 92.4 03/31/2019 1256   MCH 28.9 03/31/2019 1256   MCHC 31.3 03/31/2019 1256   RDW 14.8 03/31/2019 1256   LYMPHSABS 0.2 (L) 03/31/2019 1256   MONOABS 0.6 03/31/2019 1256   EOSABS 0.2 03/31/2019 1256   BASOSABS 0.0 03/31/2019 1256   CMP Latest Ref Rng & Units 03/31/2019 03/02/2019 01/25/2019  Glucose 70 - 99 mg/dL 128(H) 126(H) 125(H)  BUN 8 - 23 mg/dL 33(H) 27(H) 36(H)  Creatinine 0.44 - 1.00 mg/dL 0.77 0.95 0.92  Sodium 135 - 145 mmol/L 136 140 137  Potassium 3.5 - 5.1 mmol/L 3.7 4.1 4.2  Chloride 98 - 111 mmol/L 104 107 104  CO2 22 - 32 mmol/L 23 24 25   Calcium 8.9 -  10.3 mg/dL 8.8(L) 8.8(L) 9.0  Total Protein 6.5 - 8.1 g/dL 6.6 6.8 -  Total Bilirubin 0.3 - 1.2 mg/dL 0.6 0.6 -  Alkaline Phos 38 - 126 U/L 41 54 -  AST 15 - 41 U/L 17 17 -  ALT 0 - 44 U/L 16 18 -       DIAGNOSTIC IMAGING:  I have independently reviewed the scans and discussed with the patient.     ASSESSMENT & PLAN:   Anal cancer ([place]) 1.  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  Because of COVID-19, she did not seek medical attention.  She used OTC creams like Monistat. -She was evaluated by Dr. Constance Haw,  biopsy of the internal anal mass and perianal ulceration on 01/26/2019. - Biopsy showed squamous cell carcinoma of both areas. - Physical examination at diagnosis showed ulcerated lesions around the anal orifice, predominantly on the right buttock cheek and left buttock cheek (right more than left).  There is a mass in the lower part of the anal canal towards the right side.  P 16 stain was requested. 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  Small left axillary lymph node measuring 4 mm with SUV of 3.2.  High left subpectoral lymph node 3 mm with SUV of 3.1. -XRT started on 02/27/2019.  Received day 1 of 5-FU and mitomycin on 03/02/2019. -She reported nausea and vomiting on and off in the first week.  She did not come for her follow-up visit. -She reported worsening pain in the anal region during week 3 and 4.  This week has been better.  She is taking hydrocodone 5/325 1 tablet daily as she is running out of them. -Physical examination today shows ulcerated lesions on both buttock cheeks are looking better but they are contaminated by fecal material.  There is also maceration in the gluteal cleft.  She was told to keep the area dry by changing the diapers frequently. -I have reviewed her blood counts.  They look adequate to proceed with treatment.  I will decrease the mitomycin dose to 8 mg/m2. -I have told her to go to the ER if she develops any fever.  I will see her back in 2 weeks with repeat blood counts. -She will reportedly finished her radiation on 04/07/2019.  2.  Perianal pain: -She was taking hydrocodone 5/325 mg.  This is not helping her. -I will increase hydrocodone to 10 mg / 325 mg twice daily as needed.  I will reevaluate her in 2 weeks.  3.  Diabetes: -She is on Tresiba 76 units in the morning and Invokana 300 mg daily. 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   Total time spent is 25 minutes with more than 50% of the time spent face-to-face discussing treatment related side effects, counseling and coordination of care.  Orders placed this encounter:  No orders of the defined types were placed in this encounter.     Derek Jack, MD [place] [phone]

## 2019-03-31 NOTE — Progress Notes (Signed)
Treatment given today per MD orders. Tolerated infusion without adverse affects. Vital signs stable. No complaints at this time. 11fu pump infusing per protocol.Discharged from clinic ambulatory. F/U with [facility] as scheduled.

## 2019-03-31 NOTE — Assessment & Plan Note (Signed)
1.  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  Because of COVID-19, she did not seek medical attention.  She used OTC creams like Monistat. -She was evaluated by Dr. Constance Haw, biopsy of the internal anal mass and perianal ulceration on 01/26/2019. - Biopsy showed squamous cell carcinoma of both areas. - Physical examination at diagnosis showed ulcerated lesions around the anal orifice, predominantly on the right buttock cheek and left buttock cheek (right more than left).  There is a mass in the lower part of the anal canal towards the right side.  P 16 stain was requested. 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  Small left axillary lymph node measuring 4 mm with SUV of 3.2.  High left subpectoral lymph node 3 mm with SUV of 3.1. -XRT started on 02/27/2019.  Received day 1 of 5-FU and mitomycin on 03/02/2019. -She reported nausea and vomiting on and off in the first week.  She did not come for her follow-up visit. -She reported worsening pain in the anal region during week 3 and 4.  This week has been better.  She is taking hydrocodone 5/325 1 tablet daily as she is running out of them. -Physical examination today shows ulcerated lesions on both buttock cheeks are looking better but they are contaminated by fecal material.  There is also maceration in the gluteal cleft.  She was told to keep the area dry by changing the diapers frequently. -I have reviewed her blood counts.  They look adequate to proceed with treatment.  I will decrease the mitomycin dose to 8 mg/m2. -I have told her to go to the ER if she develops any fever.  I will see her back in 2 weeks with repeat blood counts. -She will reportedly finished her radiation on 04/07/2019.  2.  Perianal pain: -She was taking hydrocodone 5/325 mg.  This is not helping her. -I will  increase hydrocodone to 10 mg / 325 mg twice daily as needed.  I will reevaluate her in 2 weeks.  3.  Diabetes: -She is on Tresiba 76 units in the morning and Invokana 300 mg daily. 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

## 2019-03-31 NOTE — Patient Instructions (Addendum)
[place] at [facility] Discharge Instructions  You were seen today by Dr. Raliegh Ip.  He talked to you about how you tolerated chemotherapy at the beginning of your treatments.  He talked with you about the importance of going straight to the ER if you develop any signs of infection.  We will see you next week to remove the pump.     Thank you for choosing [place] at [facility] to provide your oncology and hematology care.  To afford each patient quality time with our provider, please arrive at least 15 minutes before your scheduled appointment time.   If you have a lab appointment with the [facility] please come in thru the Main Entrance and check in at the main information desk.  You need to re-schedule your appointment should you arrive 10 or more minutes late.  We strive to give you quality time with our providers, and arriving late affects you and other patients whose appointments are after yours.  Also, if you no show three or more times for appointments you may be dismissed from the clinic at the providers discretion.     Again, thank you for choosing [facility]  Our hope is that these requests will decrease the amount of time that you wait before being seen by our physicians.       _____________________________________________________________  Should you have questions after your visit to [facility], please contact our office at [phone] between the hours of 8:00 a.m. and 4:30 p.m.  Voicemails left after 4:00 p.m. will not be returned until the following business day.  For prescription refill requests, have your pharmacy contact our office and allow 72 hours.    Due to Covid, you will need to wear a mask upon entering the hospital. If you do not have a mask, a mask will be given to you at the Main Entrance upon arrival. For doctor visits, patients may have 1 support person with them. For treatment visits,  patients can not have anyone with them due to social distancing guidelines and our immunocompromised population.

## 2019-04-03 ENCOUNTER — Other Ambulatory Visit: Payer: Self-pay

## 2019-04-03 ENCOUNTER — Ambulatory Visit
Admission: RE | Admit: 2019-04-03 | Discharge: 2019-04-03 | Disposition: A | Discharge status: Home / Self Care | Payer: Medicare HMO | Source: Ambulatory Visit | Attending: Radiation Oncology | Admitting: Radiation Oncology

## 2019-04-03 DIAGNOSIS — Z510 Encounter for antineoplastic radiation therapy: Secondary | ICD-10-CM | POA: Diagnosis not present

## 2019-04-04 ENCOUNTER — Other Ambulatory Visit (HOSPITAL_COMMUNITY): Payer: Self-pay | Admitting: *Deleted

## 2019-04-04 ENCOUNTER — Other Ambulatory Visit: Payer: Self-pay

## 2019-04-04 ENCOUNTER — Ambulatory Visit
Admission: RE | Admit: 2019-04-04 | Discharge: 2019-04-04 | Disposition: A | Discharge status: Home / Self Care | Payer: Medicare HMO | Source: Ambulatory Visit | Attending: Radiation Oncology | Admitting: Radiation Oncology

## 2019-04-04 ENCOUNTER — Inpatient Hospital Stay (HOSPITAL_COMMUNITY): Payer: Medicare HMO

## 2019-04-04 VITALS — BP 124/52 | HR 82 | Temp 97.5°F | Resp 16

## 2019-04-04 DIAGNOSIS — Z510 Encounter for antineoplastic radiation therapy: Secondary | ICD-10-CM | POA: Diagnosis not present

## 2019-04-04 DIAGNOSIS — C210 Malignant neoplasm of anus, unspecified: Secondary | ICD-10-CM

## 2019-04-04 DIAGNOSIS — Z5111 Encounter for antineoplastic chemotherapy: Secondary | ICD-10-CM | POA: Diagnosis not present

## 2019-04-04 MED ORDER — SODIUM CHLORIDE 0.9% FLUSH
10.0000 mL | INTRAVENOUS | Status: DC | PRN
  Administered 2019-04-04: 13:00:00 10 mL
  Filled 2019-04-04: qty 10

## 2019-04-04 MED ORDER — HEPARIN SOD (PORK) LOCK FLUSH 100 UNIT/ML IV SOLN
500.0000 [IU] | Freq: Once | INTRAVENOUS | Status: AC | PRN
  Administered 2019-04-04: 500 [IU]

## 2019-04-04 NOTE — Progress Notes (Signed)
Jacqueline Bird presents today for 5FU pump discontinuation. Pump discontinued and port flushed with NS and heparin per protocol. Port deaccessed and bandaid applied. VSS. Discharged in satisfactory condition with follow up instructions.

## 2019-04-05 ENCOUNTER — Other Ambulatory Visit: Payer: Self-pay

## 2019-04-05 ENCOUNTER — Ambulatory Visit
Admission: RE | Admit: 2019-04-05 | Discharge: 2019-04-05 | Disposition: A | Discharge status: Home / Self Care | Payer: Medicare HMO | Source: Ambulatory Visit | Attending: Radiation Oncology | Admitting: Radiation Oncology

## 2019-04-05 DIAGNOSIS — Z510 Encounter for antineoplastic radiation therapy: Secondary | ICD-10-CM | POA: Diagnosis not present

## 2019-04-06 ENCOUNTER — Ambulatory Visit
Admission: RE | Admit: 2019-04-06 | Discharge: 2019-04-06 | Disposition: A | Discharge status: Home / Self Care | Payer: Medicare HMO | Source: Ambulatory Visit | Attending: Radiation Oncology | Admitting: Radiation Oncology

## 2019-04-06 ENCOUNTER — Other Ambulatory Visit: Payer: Self-pay

## 2019-04-06 DIAGNOSIS — Z510 Encounter for antineoplastic radiation therapy: Secondary | ICD-10-CM | POA: Diagnosis not present

## 2019-04-07 ENCOUNTER — Ambulatory Visit
Admission: RE | Admit: 2019-04-07 | Discharge: 2019-04-07 | Disposition: A | Discharge status: Home / Self Care | Payer: Medicare HMO | Source: Ambulatory Visit | Attending: Radiation Oncology | Admitting: Radiation Oncology

## 2019-04-07 ENCOUNTER — Encounter: Payer: Self-pay | Admitting: Radiation Oncology

## 2019-04-07 ENCOUNTER — Other Ambulatory Visit: Payer: Self-pay | Admitting: Radiation Oncology

## 2019-04-07 ENCOUNTER — Other Ambulatory Visit: Payer: Self-pay

## 2019-04-07 DIAGNOSIS — Z510 Encounter for antineoplastic radiation therapy: Secondary | ICD-10-CM | POA: Diagnosis not present

## 2019-04-07 MED ORDER — HYDROCODONE-ACETAMINOPHEN 10-325 MG PO TABS: 1.0000 | ORAL_TABLET | Freq: Four times a day (QID) | ORAL | 0 refills | Status: DC | PRN

## 2019-04-17 ENCOUNTER — Other Ambulatory Visit (HOSPITAL_COMMUNITY): Payer: Self-pay | Admitting: *Deleted

## 2019-04-17 ENCOUNTER — Encounter (HOSPITAL_COMMUNITY): Payer: Self-pay | Admitting: *Deleted

## 2019-04-17 NOTE — Progress Notes (Signed)
I received a call from [facility] this morning. She is the patient's daughter.  She states that she had to climb into a window to get in the house to check on Pat this morning.  She said that the patient reports zero energy, no appetite, blisters on rectum and surrounding area, no control of bowels, night sweats.  She is diabetic.  I reported the above to Dr. Delton Coombes and he wants patient to be seen by wound care.  Orders placed for referral.  Patient's daughter had phone on speaker so Fraser Din could hear me.  I advised her of the referral to wound care.  I also advised her to follow up with her primary care physician or endocrinologist that follows her diabetes.  I told her that her insulin should not be taken as previously prescribed as she is not eating and the insulin could make her sugar levels too low.  This could be contributing to her decrease energy and not feeling well in addition to the recent chemo/radiation.  She and her daughter verbalize understanding.  She will follow up here as previously scheduled.

## 2019-04-18 ENCOUNTER — Other Ambulatory Visit: Payer: Self-pay

## 2019-04-18 ENCOUNTER — Inpatient Hospital Stay (HOSPITAL_COMMUNITY): Payer: Medicare HMO

## 2019-04-18 ENCOUNTER — Encounter (HOSPITAL_COMMUNITY): Payer: Self-pay | Admitting: Hematology

## 2019-04-18 ENCOUNTER — Inpatient Hospital Stay (HOSPITAL_COMMUNITY): Payer: Medicare HMO | Attending: Hematology | Admitting: Hematology

## 2019-04-18 DIAGNOSIS — E118 Type 2 diabetes mellitus with unspecified complications: Secondary | ICD-10-CM | POA: Diagnosis not present

## 2019-04-18 DIAGNOSIS — Z801 Family history of malignant neoplasm of trachea, bronchus and lung: Secondary | ICD-10-CM | POA: Insufficient documentation

## 2019-04-18 DIAGNOSIS — Z9221 Personal history of antineoplastic chemotherapy: Secondary | ICD-10-CM | POA: Insufficient documentation

## 2019-04-18 DIAGNOSIS — G893 Neoplasm related pain (acute) (chronic): Secondary | ICD-10-CM | POA: Diagnosis not present

## 2019-04-18 DIAGNOSIS — Z79899 Other long term (current) drug therapy: Secondary | ICD-10-CM | POA: Insufficient documentation

## 2019-04-18 DIAGNOSIS — I10 Essential (primary) hypertension: Secondary | ICD-10-CM | POA: Insufficient documentation

## 2019-04-18 DIAGNOSIS — F419 Anxiety disorder, unspecified: Secondary | ICD-10-CM | POA: Insufficient documentation

## 2019-04-18 DIAGNOSIS — C210 Malignant neoplasm of anus, unspecified: Secondary | ICD-10-CM

## 2019-04-18 DIAGNOSIS — Z803 Family history of malignant neoplasm of breast: Secondary | ICD-10-CM | POA: Diagnosis not present

## 2019-04-18 LAB — CBC WITH DIFFERENTIAL/PLATELET
Abs Immature Granulocytes: 0.22 10*3/uL — ABNORMAL HIGH (ref 0.00–0.07)
Basophils Absolute: 0 10*3/uL (ref 0.0–0.1)
Basophils Relative: 1 %
Eosinophils Absolute: 0 10*3/uL (ref 0.0–0.5)
Eosinophils Relative: 0 %
HCT: 33.3 % — ABNORMAL LOW (ref 36.0–46.0)
Hemoglobin: 11.2 g/dL — ABNORMAL LOW (ref 12.0–15.0)
Immature Granulocytes: 3 %
Lymphocytes Relative: 3 %
Lymphs Abs: 0.2 10*3/uL — ABNORMAL LOW (ref 0.7–4.0)
MCH: 30.4 pg (ref 26.0–34.0)
MCHC: 33.6 g/dL (ref 30.0–36.0)
MCV: 90.2 fL (ref 80.0–100.0)
Monocytes Absolute: 0.7 10*3/uL (ref 0.1–1.0)
Monocytes Relative: 8 %
Neutro Abs: 7 10*3/uL (ref 1.7–7.7)
Neutrophils Relative %: 85 %
Platelets: 124 10*3/uL — ABNORMAL LOW (ref 150–400)
RBC: 3.69 MIL/uL — ABNORMAL LOW (ref 3.87–5.11)
RDW: 15.8 % — ABNORMAL HIGH (ref 11.5–15.5)
WBC: 8.2 10*3/uL (ref 4.0–10.5)
nRBC: 0 % (ref 0.0–0.2)

## 2019-04-18 LAB — COMPREHENSIVE METABOLIC PANEL
ALT: 22 U/L (ref 0–44)
AST: 26 U/L (ref 15–41)
Albumin: 2.6 g/dL — ABNORMAL LOW (ref 3.5–5.0)
Alkaline Phosphatase: 59 U/L (ref 38–126)
Anion gap: 13 (ref 5–15)
BUN: 25 mg/dL — ABNORMAL HIGH (ref 8–23)
CO2: 24 mmol/L (ref 22–32)
Calcium: 8.7 mg/dL — ABNORMAL LOW (ref 8.9–10.3)
Chloride: 96 mmol/L — ABNORMAL LOW (ref 98–111)
Creatinine, Ser: 0.95 mg/dL (ref 0.44–1.00)
GFR calc Af Amer: >60 mL/min (ref >60)
GFR calc non Af Amer: >60 mL/min (ref >60)
Glucose, Bld: 338 mg/dL — ABNORMAL HIGH (ref 70–99)
Potassium: 4.1 mmol/L (ref 3.5–5.1)
Sodium: 133 mmol/L — ABNORMAL LOW (ref 135–145)
Total Bilirubin: 0.4 mg/dL (ref 0.3–1.2)
Total Protein: 6.6 g/dL (ref 6.5–8.1)

## 2019-04-18 LAB — MAGNESIUM: Magnesium: 1.9 mg/dL (ref 1.7–2.4)

## 2019-04-18 MED ORDER — ALPRAZOLAM 0.5 MG PO TABS: 0.5000 mg | ORAL_TABLET | Freq: Two times a day (BID) | ORAL | 0 refills | Status: DC | PRN

## 2019-04-18 MED ORDER — ACETAMINOPHEN 325 MG PO TABS
ORAL_TABLET | ORAL | Status: AC
  Filled 2019-04-18: qty 2

## 2019-04-18 MED ORDER — HYDROCODONE-ACETAMINOPHEN 10-325 MG PO TABS: 1.0000 | ORAL_TABLET | ORAL | 0 refills | Status: DC | PRN

## 2019-04-18 NOTE — Progress Notes (Signed)
  Radiation Oncology         [phone] ________________________________  Name: Jacqueline Bird MRN: [id number]  Date: 02/16/2019  DOB: [birth year]  SIMULATION AND TREATMENT PLANNING NOTE   DIAGNOSIS:     ICD-10-CM   1. Anal cancer ([place])  C21.0      CONSENT VERIFIED: yes   SET UP: Patient is set-up supine   IMMOBILIZATION: The following immobilization is used: Customized VAC lock bag. This complex treatment device will be used on a daily basis during the patient's treatment.   Diagnosis: Anal cancer   NARRATIVE: The patient was brought to the [facility]. Identity was confirmed. All relevant records and images related to the planned course of therapy were reviewed. Then, the patient was positioned in a stable reproducible clinical set-up for radiation therapy using a customized vac lock bag. Skin markings were placed. The CT images were loaded into the planning software where the target and avoidance structures were contoured.The radiation prescription was entered and confirmed.   The patient will receive 54 Gy in 30 fractions to the high-dose target region.  Daily image guidance is ordered, and this will be used on a daily basis. This is necessary to ensure accurate and precise localization of the target in addition to accurate alignment of the normal tissue structures in this region. This is particularly important given the possible motion of the high-dose target.  Treatment planning then occurred.   I have requested : Intensity Modulated Radiotherapy (IMRT) is medically necessary for this case for the following reason: Dose homogeneity; the target is in close proximity to critical normal structures, including the femoral heads, bladder, and small bowel. IMRT is thus medically to appropriately treat the patient.   Special treatment procedure  The patient will receive chemotherapy during the course of radiation treatment. The patient may experience increased or  overlapping toxicity due to this combined-modality approach and the patient will be monitored for such problems. This may include extra lab  work as necessary. This therefore constitutes a special treatment procedure.     ________________________________  Jodelle Gross, MD, PhD

## 2019-04-18 NOTE — Patient Instructions (Addendum)
[facility] at [facility] Discharge Instructions  You were seen today by Dr. Katragadda. He went over your recent lab results. He will see you back in 2 weeks for labs and follow up.   Thank you for choosing [facility] at [facility] to provide your oncology and hematology care.  To afford each patient quality time with our provider, please arrive at least 15 minutes before your scheduled appointment time.   If you have a lab appointment with the Cancer Center please come in thru the  Main Entrance and check in at the main information desk  You need to re-schedule your appointment should you arrive 10 or more minutes late.  We strive to give you quality time with our providers, and arriving late affects you and other patients whose appointments are after yours.  Also, if you no show three or more times for appointments you may be dismissed from the clinic at the providers discretion.     Again, thank you for choosing [facility].  Our hope is that these requests will decrease the amount of time that you wait before being seen by our physicians.       _____________________________________________________________  Should you have questions after your visit to [facility], please contact our office at [phone] between the hours of 8:00 a.m. and 4:30 p.m.  Voicemails left after 4:00 p.m. will not be returned until the following business day.  For prescription refill requests, have your pharmacy contact our office and allow 72 hours.    Cancer Center Support Programs:   > Cancer Support Group  2nd Tuesday of the month 1pm-2pm, Journey Room    

## 2019-04-18 NOTE — Progress Notes (Signed)
Jacqueline Bird, [place] [address]   CLINIC:  Medical Oncology/Hematology  PCP:  Lucia Gaskins, [place] [address] [phone]   REASON FOR VISIT:  Follow-up for anal cancer.  CURRENT THERAPY: Chemoradiation therapy.  BRIEF ONCOLOGIC HISTORY:  Oncology History  Anal cancer ([place])  02/01/2019 Initial Diagnosis   Anal cancer ([place])   02/16/2019 Cancer Staging   Staging form: Anus, AJCC 8th Edition - Clinical: Stage IIIC (cT3, cN1c, cM0) - Signed by Kyung Rudd, MD on 02/16/2019   03/03/2019 -  Chemotherapy   The patient had mitoMYcin (MUTAMYCIN) chemo injection 20 mg, 9.7 mg/m2 = 21 mg, Intravenous,  Once, 1 of 1 cycle Dose modification: 8 mg/m2 (original dose 10 mg/m2, Cycle 1, Reason: Provider Judgment) Administration: 20 mg (03/03/2019), 16.5 mg (03/31/2019) fluorouracil (ADRUCIL) 8,300 mg in sodium chloride 0.9 % 84 mL chemo infusion, 1,000 mg/m2/day = 8,300 mg, Intravenous, 4D (96 hours ), 1 of 1 cycle Administration: 8,300 mg (03/03/2019), 8,300 mg (03/31/2019)  for chemotherapy treatment.       CANCER STAGING: Cancer Staging Anal cancer ([place]) Staging form: Anus, AJCC 8th Edition - Clinical: Stage IIIC (cT3, cN1c, cM0) - Signed by Kyung Rudd, MD on 02/16/2019    INTERVAL HISTORY:  Jacqueline Bird 68 y.o. female seen for follow-up of anal cancer.  She has completed radiation on April 07, 2019.  She has developed severe skin breakdown in the groin region and perianal region.  She is in extreme pain at times.  She has also become very weak.  Pain is rated as 10 out of 10.  Appetite and energy levels are very low.  Sleep problems due to pain present.  Numbness in the extremities has been stable.  Denies any fevers or chills.    REVIEW OF SYSTEMS:  Review of Systems  Constitutional: Positive for fatigue.  Gastrointestinal:       Pain in the anal region.  Neurological: Positive for numbness.   Psychiatric/Behavioral: The patient is nervous/anxious.   All other systems reviewed and are negative.    PAST MEDICAL/SURGICAL HISTORY:  Past Medical History:  Diagnosis Date  . 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 Cervical cancer ([place])   . Depression   . Diabetes mellitus without complication ([place])   . History of cervical cancer   . Hypertension    Past Surgical History:  Procedure Laterality Date  . CATARACT EXTRACTION, BILATERAL Right 05/2017  . CERVICAL CONE BIOPSY    . HAND AMPUTATION Left   . PORTACATH PLACEMENT Left 02/08/2019   Procedure: INSERTION PORT-A-CATH;  Surgeon: Virl Cagey, MD;  Location: [facility];  Service: General;  Laterality: Left;  . RECTAL BIOPSY  01/26/2019   Procedure: BIOPSY OF ANAL MASS;  Surgeon: Virl Cagey, MD;  Location: [facility];  Service: General;;  . RECTAL EXAM UNDER ANESTHESIA N/A 01/26/2019   Procedure: RECTAL EXAM UNDER ANESTHESIA;  Surgeon: Virl Cagey, MD;  Location: [facility];  Service: General;  Laterality: N/A;     SOCIAL HISTORY:  Social History   Socioeconomic History  . Marital status: Single    Spouse name: Not on file  . Number of children: 2  . Years of education: Not on file  . Highest education level: Not on file  Occupational History  . Occupation: retired  Scientific laboratory technician  . Financial resource strain: Somewhat hard  . Food insecurity    Worry: Never true  Inability: Never true  . Transportation needs    Medical: No    Non-medical: No  Tobacco Use  . Smoking status: Never Smoker  . Smokeless tobacco: Never Used  Substance and Sexual Activity  . Alcohol use: Not Currently    Frequency: Never  . Drug use: Never  . Sexual activity: Not on file  Lifestyle  . Physical activity    Days per week: 7 days    Minutes per session: 60 min  . Stress: To some extent  Relationships  . Social Herbalist on phone: Three times a week    Gets together: Three times a week    Attends religious  service: 1 to 4 times per year    Active member of club or organization: Yes    Attends meetings of clubs or organizations: 1 to 4 times per year    Relationship status: Separated  . Intimate partner violence    Fear of current or ex partner: No    Emotionally abused: No    Physically abused: No    Forced sexual activity: No  Other Topics Concern  . Not on file  Social History Narrative   Pt is retired and currently engaged.    FAMILY HISTORY:  Family History  Problem Relation Age of Onset  . Cataracts Mother   . Glaucoma Mother   . Heart disease Mother   . Diabetes Father   . Heart attack Father   . Cataracts Sister   . Lung disease Sister   . Cataracts Brother   . Diabetes Brother   . Heart disease Brother   . Macular degeneration Maternal Aunt   . Diabetes Maternal Grandfather   . Heart disease Sister   . Diabetes Sister   . Throat cancer Son   . Breast cancer Niece   . Breast cancer Niece   . Amblyopia Neg Hx   . Blindness Neg Hx   . Retinal detachment Neg Hx   . Strabismus Neg Hx   . Retinitis pigmentosa Neg Hx     CURRENT MEDICATIONS:  Outpatient Encounter Medications as of 04/18/2019  Medication Sig Note  . acetaminophen (TYLENOL) 500 MG tablet Take 1,000 mg by mouth every 8 (eight) hours as needed (pain).   Marland Kitchen docusate sodium (COLACE) 100 MG capsule Take 1 capsule (100 mg total) by mouth 2 (two) times daily. (Patient taking differently: Take 100 mg by mouth at bedtime. ) 02/01/2019: OTC   . fluorouracil CALGB 60454 in sodium chloride 0.9 % 150 mL Inject into the vein over 96 hr. Day 1 and Day 28   . gabapentin (NEURONTIN) 100 MG capsule Take 100 mg by mouth 3 (three) times daily.   Marland Kitchen HYDROcodone-acetaminophen (NORCO) 10-325 MG tablet Take 1 tablet by mouth every 4 (four) hours as needed.   . INVOKANA 300 MG TABS tablet Take 300 mg by mouth daily.   Marland Kitchen lisinopril (ZESTRIL) 20 MG tablet Take 20 mg by mouth daily.   Marland Kitchen MITOMYCIN IV Inject into the vein. Day 1 and  Day 28   . nabumetone (RELAFEN) 750 MG tablet Take 750 mg by mouth 2 (two) times daily.   . silver sulfADIAZINE (SILVADENE) 1 % cream Apply to affected area daily   . simvastatin (ZOCOR) 40 MG tablet Take 40 mg by mouth daily.    . traZODone (DESYREL) 50 MG tablet Take 50 mg by mouth at bedtime.   . TRESIBA FLEXTOUCH 200 UNIT/ML SOPN Inject 76 Units into  the skin every morning.    . TRINTELLIX 10 MG TABS tablet Take 10 mg by mouth daily.   . TRULICITY A999333 0000000 SOPN Inject 0.75 mg into the muscle every Sunday.    . [DISCONTINUED] HYDROcodone-acetaminophen (NORCO) 10-325 MG tablet Take 1 tablet by mouth every 6 (six) hours as needed.   . ALPRAZolam (XANAX) 0.5 MG tablet Take 1 tablet (0.5 mg total) by mouth 2 (two) times daily as needed for anxiety.   . Lidocaine, Anorectal, 5 % GEL Apply 1 application topically 4 (four) times daily as needed. (Patient not taking: Reported on 04/18/2019)   . lidocaine-prilocaine (EMLA) cream Apply a small amount to port a cath site and cover with plastic wrap 1 hour prior to chemotherapy (Patient not taking: Reported on 04/18/2019)   . prochlorperazine (COMPAZINE) 10 MG tablet Take 1 tablet (10 mg total) by mouth every 6 (six) hours as needed for nausea or vomiting. (Patient not taking: Reported on 04/18/2019)   . [DISCONTINUED] ALPRAZolam (XANAX) 0.5 MG tablet Take 1 tablet (0.5 mg total) by mouth 2 (two) times daily as needed for anxiety. (Patient not taking: Reported on 04/18/2019)    Facility-Administered Encounter Medications as of 04/18/2019  Medication  . heparin lock flush 100 unit/mL  . sodium chloride flush (NS) 0.9 % injection 10 mL    ALLERGIES:  Allergies  Allergen Reactions  . Sulfa Antibiotics Anaphylaxis    Per pt, can use creams with sulfa in it  . Sulfamethoxazole Other (See Comments)    Tongue swelling     PHYSICAL EXAM:  ECOG Performance status: 1  Vitals:   04/18/19 1023  BP: (!) 94/34  Pulse: 89  Resp: 18  Temp: 97.8 F  (36.6 C)  SpO2: 100%   There were no vitals filed for this visit.  Physical Exam Vitals signs reviewed.  Constitutional:      Appearance: Normal appearance.  Cardiovascular:     Rate and Rhythm: Normal rate and regular rhythm.     Heart sounds: Normal heart sounds.  Pulmonary:     Effort: Pulmonary effort is normal.     Breath sounds: Normal breath sounds.  Abdominal:     General: There is no distension.     Palpations: Abdomen is soft. There is no mass.  Musculoskeletal:        General: No swelling.  Skin:    General: Skin is warm.  Neurological:     General: No focal deficit present.     Mental Status: She is alert and oriented to person, place, and time.  Psychiatric:        Mood and Affect: Mood normal.        Behavior: Behavior normal.    Extensive skin breakdown of the groin region and perineal region with erythema.  No pus noted.  Mild induration.  LABORATORY DATA:  I have reviewed the labs as listed.  CBC    Component Value Date/Time   WBC 8.2 04/18/2019 0957   RBC 3.69 (L) 04/18/2019 0957   HGB 11.2 (L) 04/18/2019 0957   HCT 33.3 (L) 04/18/2019 0957   PLT 124 (L) 04/18/2019 0957   MCV 90.2 04/18/2019 0957   MCH 30.4 04/18/2019 0957   MCHC 33.6 04/18/2019 0957   RDW 15.8 (H) 04/18/2019 0957   LYMPHSABS 0.2 (L) 04/18/2019 0957   MONOABS 0.7 04/18/2019 0957   EOSABS 0.0 04/18/2019 0957   BASOSABS 0.0 04/18/2019 0957   CMP Latest Ref Rng & Units 04/18/2019 03/31/2019 03/02/2019  Glucose 70 - 99 mg/dL 338(H) 128(H) 126(H)  BUN 8 - 23 mg/dL 25(H) 33(H) 27(H)  Creatinine 0.44 - 1.00 mg/dL 0.95 0.77 0.95  Sodium 135 - 145 mmol/L 133(L) 136 140  Potassium 3.5 - 5.1 mmol/L 4.1 3.7 4.1  Chloride 98 - 111 mmol/L 96(L) 104 107  CO2 22 - 32 mmol/L 24 23 24   Calcium 8.9 - 10.3 mg/dL 8.7(L) 8.8(L) 8.8(L)  Total Protein 6.5 - 8.1 g/dL 6.6 6.6 6.8  Total Bilirubin 0.3 - 1.2 mg/dL 0.4 0.6 0.6  Alkaline Phos 38 - 126 U/L 59 41 54  AST 15 - 41 U/L 26 17 17   ALT 0 -  44 U/L 22 16 18        DIAGNOSTIC IMAGING:  I have independently reviewed the scans and discussed with the patient.     ASSESSMENT & PLAN:   Anal cancer ([place]) 1.  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  Because of COVID-19, she did not seek medical attention.  She used OTC creams like Monistat. -She was evaluated by Dr. Constance Haw, biopsy of the internal anal mass and perianal ulceration on 01/26/2019. - Biopsy showed squamous cell carcinoma of both areas. - Physical examination at diagnosis showed ulcerated lesions around the anal orifice, predominantly on the right buttock cheek and left buttock cheek (right more than left).  There is a mass in the lower part of the anal canal towards the right side.  P 16 stain was requested. 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  Small left axillary lymph node measuring 4 mm with SUV of 3.2.  High left subpectoral lymph node 3 mm with SUV of 3.1. -XRT started on 02/27/2019.  Received day 1 of 5-FU and mitomycin on 03/02/2019. -Day 28 of treatment on March 31, 2019 with dose reduction of mitomycin. -She completed RT on April 07, 2019. -She has experienced severe skin breakdown in the groin region and perianal region.  She is in extreme pain. -On examination today there is erythema and skin breakdown of the groin region and perianal region.  No clear signs of infection.  We will make a referral to wound clinic.  Also made a referral to home health. -I will reevaluate her in 2 weeks.  2.  Perianal pain: -She is taking hydrocodone 10 mg every 6 hours.  She ran out of the pills. -I will give her prescription for hydrocodone 10/325 every 4 hours as needed #84.  3.  Diabetes: -She is on Tresiba 76 units in the morning and Invokana 300 mg daily. 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  4.  Anxiety: -We will give her Xanax 0.5 mg twice daily as needed.   Total time spent is 25 minutes with more than 50% of the time spent face-to-face discussing treatment plan, counseling and coordination of care.  Orders placed this encounter:  No orders of the defined types were placed in this encounter.     Derek Jack, MD [place] [phone]

## 2019-04-18 NOTE — Assessment & Plan Note (Addendum)
1.  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  Because of COVID-19, she did not seek medical attention.  She used OTC creams like Monistat. -She was evaluated by Dr. Constance Haw, biopsy of the internal anal mass and perianal ulceration on 01/26/2019. - Biopsy showed squamous cell carcinoma of both areas. - Physical examination at diagnosis showed ulcerated lesions around the anal orifice, predominantly on the right buttock cheek and left buttock cheek (right more than left).  There is a mass in the lower part of the anal canal towards the right side.  P 16 stain was requested. 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  Small left axillary lymph node measuring 4 mm with SUV of 3.2.  High left subpectoral lymph node 3 mm with SUV of 3.1. -XRT started on 02/27/2019.  Received day 1 of 5-FU and mitomycin on 03/02/2019. -Day 28 of treatment on March 31, 2019 with dose reduction of mitomycin. -She completed RT on April 07, 2019. -She has experienced severe skin breakdown in the groin region and perianal region.  She is in extreme pain. -On examination today there is erythema and skin breakdown of the groin region and perianal region.  No clear signs of infection.  We will make a referral to wound clinic.  Also made a referral to home health. -I will reevaluate her in 2 weeks.  2.  Perianal pain: -She is taking hydrocodone 10 mg every 6 hours.  She ran out of the pills. -I will give her prescription for hydrocodone 10/325 every 4 hours as needed #84.  3.  Diabetes: -She is on Tresiba 76 units in the morning and Invokana 300 mg daily. 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  4.  Anxiety: -We will give her Xanax 0.5 mg twice daily as needed.

## 2019-04-19 ENCOUNTER — Other Ambulatory Visit (HOSPITAL_COMMUNITY): Payer: Self-pay | Admitting: *Deleted

## 2019-04-19 DIAGNOSIS — C210 Malignant neoplasm of anus, unspecified: Secondary | ICD-10-CM

## 2019-04-19 NOTE — Progress Notes (Signed)
Orders placed for home health and wound care.  She was accepted by [facility] and they will start treatments on 04/20/2019

## 2019-04-20 ENCOUNTER — Other Ambulatory Visit (HOSPITAL_COMMUNITY): Payer: Self-pay | Admitting: Nurse Practitioner

## 2019-04-27 ENCOUNTER — Other Ambulatory Visit (HOSPITAL_COMMUNITY): Payer: Self-pay | Admitting: *Deleted

## 2019-04-27 MED ORDER — SILVER SULFADIAZINE 1 % EX CREA: TOPICAL_CREAM | CUTANEOUS | 1 refills | Status: DC

## 2019-05-03 ENCOUNTER — Inpatient Hospital Stay (HOSPITAL_COMMUNITY): Payer: Medicare HMO

## 2019-05-03 ENCOUNTER — Inpatient Hospital Stay (HOSPITAL_BASED_OUTPATIENT_CLINIC_OR_DEPARTMENT_OTHER): Payer: Medicare HMO | Admitting: Hematology

## 2019-05-03 ENCOUNTER — Encounter (HOSPITAL_COMMUNITY): Payer: Self-pay | Admitting: Hematology

## 2019-05-03 ENCOUNTER — Other Ambulatory Visit (HOSPITAL_COMMUNITY): Payer: Self-pay | Admitting: *Deleted

## 2019-05-03 ENCOUNTER — Other Ambulatory Visit: Payer: Self-pay

## 2019-05-03 DIAGNOSIS — C210 Malignant neoplasm of anus, unspecified: Secondary | ICD-10-CM

## 2019-05-03 LAB — CBC WITH DIFFERENTIAL/PLATELET
Abs Immature Granulocytes: 0.07 10*3/uL (ref 0.00–0.07)
Basophils Absolute: 0 10*3/uL (ref 0.0–0.1)
Basophils Relative: 0 %
Eosinophils Absolute: 0 10*3/uL (ref 0.0–0.5)
Eosinophils Relative: 1 %
HCT: 34.2 % — ABNORMAL LOW (ref 36.0–46.0)
Hemoglobin: 10.5 g/dL — ABNORMAL LOW (ref 12.0–15.0)
Immature Granulocytes: 2 %
Lymphocytes Relative: 10 %
Lymphs Abs: 0.5 10*3/uL — ABNORMAL LOW (ref 0.7–4.0)
MCH: 30 pg (ref 26.0–34.0)
MCHC: 30.7 g/dL (ref 30.0–36.0)
MCV: 97.7 fL (ref 80.0–100.0)
Monocytes Absolute: 0.7 10*3/uL (ref 0.1–1.0)
Monocytes Relative: 14 %
Neutro Abs: 3.5 10*3/uL (ref 1.7–7.7)
Neutrophils Relative %: 73 %
Platelets: 367 10*3/uL (ref 150–400)
RBC: 3.5 MIL/uL — ABNORMAL LOW (ref 3.87–5.11)
RDW: 18.9 % — ABNORMAL HIGH (ref 11.5–15.5)
WBC: 4.8 10*3/uL (ref 4.0–10.5)
nRBC: 0 % (ref 0.0–0.2)

## 2019-05-03 LAB — COMPREHENSIVE METABOLIC PANEL
ALT: 15 U/L (ref 0–44)
AST: 22 U/L (ref 15–41)
Albumin: 2.4 g/dL — ABNORMAL LOW (ref 3.5–5.0)
Alkaline Phosphatase: 56 U/L (ref 38–126)
Anion gap: 10 (ref 5–15)
BUN: 16 mg/dL (ref 8–23)
CO2: 25 mmol/L (ref 22–32)
Calcium: 8.6 mg/dL — ABNORMAL LOW (ref 8.9–10.3)
Chloride: 104 mmol/L (ref 98–111)
Creatinine, Ser: 0.87 mg/dL (ref 0.44–1.00)
GFR calc Af Amer: >60 mL/min (ref >60)
GFR calc non Af Amer: >60 mL/min (ref >60)
Glucose, Bld: 172 mg/dL — ABNORMAL HIGH (ref 70–99)
Potassium: 3.2 mmol/L — ABNORMAL LOW (ref 3.5–5.1)
Sodium: 139 mmol/L (ref 135–145)
Total Bilirubin: 0.5 mg/dL (ref 0.3–1.2)
Total Protein: 6.3 g/dL — ABNORMAL LOW (ref 6.5–8.1)

## 2019-05-03 LAB — MAGNESIUM: Magnesium: 1.9 mg/dL (ref 1.7–2.4)

## 2019-05-03 MED ORDER — SILVER SULFADIAZINE 1 % EX CREA: TOPICAL_CREAM | CUTANEOUS | 3 refills | Status: DC

## 2019-05-03 NOTE — Progress Notes (Signed)
Jacqueline Bird, [place] [address]   CLINIC:  Medical Oncology/Hematology  PCP:  Jacqueline Bird, [place] [address] [phone]   REASON FOR VISIT:  Follow-up for anal cancer.  CURRENT THERAPY: Observation.  BRIEF ONCOLOGIC HISTORY:  Oncology History  Anal cancer ([place])  02/01/2019 Initial Diagnosis   Anal cancer ([place])   02/16/2019 Cancer Staging   Staging form: Anus, AJCC 8th Edition - Clinical: Stage IIIC (cT3, cN1c, cM0) - Signed by Kyung Rudd, MD on 02/16/2019   03/03/2019 -  Chemotherapy   The patient had mitoMYcin (MUTAMYCIN) chemo injection 20 mg, 9.7 mg/m2 = 21 mg, Intravenous,  Once, 1 of 1 cycle Dose modification: 8 mg/m2 (original dose 10 mg/m2, Cycle 1, Reason: Provider Judgment) Administration: 20 mg (03/03/2019), 16.5 mg (03/31/2019) fluorouracil (ADRUCIL) 8,300 mg in sodium chloride 0.9 % 84 mL chemo infusion, 1,000 mg/m2/day = 8,300 mg, Intravenous, 4D (96 hours ), 1 of 1 cycle Administration: 8,300 mg (03/03/2019), 8,300 mg (03/31/2019)  for chemotherapy treatment.       CANCER STAGING: Cancer Staging Anal cancer ([place]) Staging form: Anus, AJCC 8th Edition - Clinical: Stage IIIC (cT3, cN1c, cM0) - Signed by Kyung Rudd, MD on 02/16/2019    INTERVAL HISTORY:  Jacqueline Bird 68 y.o. female seen for follow-up of anal cancer.  She completed radiation therapy on April 07, 2019.  She had developed severe breakdown of the skin around the perianal region.  Wound care nurses coming to her house.  She reports healing of the wound somewhat.  She reports pain in the buttock region around 10 out of 10.  Appetite is 75%.  Energy levels are low.  She is taking hydrocodone every 4 hours as needed.  Numbness in the feet has been stable.  Denies any fevers or chills.  She is accompanied by her daughter Jacqueline Bird.    REVIEW OF SYSTEMS:  Review of Systems  Constitutional: Positive for fatigue.  Gastrointestinal:      Pain in the anal region.  Neurological: Positive for numbness.  Psychiatric/Behavioral: The patient is nervous/anxious.   All other systems reviewed and are negative.    PAST MEDICAL/SURGICAL HISTORY:  Past Medical History:  Diagnosis Date  . 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 Cervical cancer ([place])   . Depression   . Diabetes mellitus without complication ([place])   . History of cervical cancer   . Hypertension    Past Surgical History:  Procedure Laterality Date  . CATARACT EXTRACTION, BILATERAL Right 05/2017  . CERVICAL CONE BIOPSY    . HAND AMPUTATION Left   . PORTACATH PLACEMENT Left 02/08/2019   Procedure: INSERTION PORT-A-CATH;  Surgeon: Virl Cagey, MD;  Location: [facility];  Service: General;  Laterality: Left;  . RECTAL BIOPSY  01/26/2019   Procedure: BIOPSY OF ANAL MASS;  Surgeon: Virl Cagey, MD;  Location: [facility];  Service: General;;  . RECTAL EXAM UNDER ANESTHESIA N/A 01/26/2019   Procedure: RECTAL EXAM UNDER ANESTHESIA;  Surgeon: Virl Cagey, MD;  Location: [facility];  Service: General;  Laterality: N/A;     SOCIAL HISTORY:  Social History   Socioeconomic History  . Marital status: Single    Spouse name: Not on file  . Number of children: 2  . Years of education: Not on file  . Highest education level: Not on file  Occupational History  . Occupation: retired  Tobacco Use  . Smoking status: Never Smoker  .  Smokeless tobacco: Never Used  Substance and Sexual Activity  . Alcohol use: Not Currently  . Drug use: Never  . Sexual activity: Not on file  Other Topics Concern  . Not on file  Social History Narrative   Pt is retired and currently engaged.   Social Determinants of Health   Financial Resource Strain: Medium Risk  . Difficulty of Paying Living Expenses: Somewhat hard  Food Insecurity: No Food Insecurity  . Worried About Charity fundraiser in the Last Year: Never true  . Ran Out of Food in the Last Year: Never true    Transportation Needs: No Transportation Needs  . Lack of Transportation (Medical): No  . Lack of Transportation (Non-Medical): No  Physical Activity: Sufficiently Active  . Days of Exercise per Week: 7 days  . Minutes of Exercise per Session: 60 min  Stress: Stress Concern Present  . Feeling of Stress : To some extent  Social Connections: Slightly Isolated  . Frequency of Communication with Friends and Family: Three times a week  . Frequency of Social Gatherings with Friends and Family: Three times a week  . Attends Religious Services: 1 to 4 times per year  . Active Member of Clubs or Organizations: Yes  . Attends Archivist Meetings: 1 to 4 times per year  . Marital Status: Separated  Intimate Partner Violence: Not At Risk  . Fear of Current or Ex-Partner: No  . Emotionally Abused: No  . Physically Abused: No  . Sexually Abused: No    FAMILY HISTORY:  Family History  Problem Relation Age of Onset  . Cataracts Mother   . Glaucoma Mother   . Heart disease Mother   . Diabetes Father   . Heart attack Father   . Cataracts Sister   . Lung disease Sister   . Cataracts Brother   . Diabetes Brother   . Heart disease Brother   . Macular degeneration Maternal Aunt   . Diabetes Maternal Grandfather   . Heart disease Sister   . Diabetes Sister   . Throat cancer Son   . Breast cancer Niece   . Breast cancer Niece   . Amblyopia Neg Hx   . Blindness Neg Hx   . Retinal detachment Neg Hx   . Strabismus Neg Hx   . Retinitis pigmentosa Neg Hx     CURRENT MEDICATIONS:  Outpatient Encounter Medications as of 05/03/2019  Medication Sig Note  . ciprofloxacin (CIPRO) 500 MG tablet Take 500 mg by mouth 2 (two) times daily.   Marland Kitchen docusate sodium (COLACE) 100 MG capsule Take 1 capsule (100 mg total) by mouth 2 (two) times daily. (Patient taking differently: Take 100 mg by mouth at bedtime. ) 02/01/2019: OTC   . fluorouracil CALGB 60454 in sodium chloride 0.9 % 150 mL Inject into  the vein over 96 hr. Day 1 and Day 28   . gabapentin (NEURONTIN) 100 MG capsule Take 100 mg by mouth 3 (three) times daily.   . INVOKANA 300 MG TABS tablet Take 300 mg by mouth daily.   Marland Kitchen lisinopril (ZESTRIL) 20 MG tablet Take 20 mg by mouth daily.   Marland Kitchen MITOMYCIN IV Inject into the vein. Day 1 and Day 28   . nabumetone (RELAFEN) 750 MG tablet Take 750 mg by mouth 2 (two) times daily.   . simvastatin (ZOCOR) 40 MG tablet Take 40 mg by mouth daily.    . traZODone (DESYREL) 50 MG tablet Take 50 mg by mouth at  bedtime.   Tyler Aas FLEXTOUCH 200 UNIT/ML SOPN Inject 76 Units into the skin every morning.    . TRINTELLIX 10 MG TABS tablet Take 10 mg by mouth daily.   . TRULICITY [id number] 0000000 SOPN Inject 0.75 mg into the muscle every Sunday.    . [DISCONTINUED] silver sulfADIAZINE (SILVADENE) 1 % cream Apply to affected area daily   . acetaminophen (TYLENOL) 500 MG tablet Take 1,000 mg by mouth every 8 (eight) hours as needed (pain).   Marland Kitchen ALPRAZolam (XANAX) 0.5 MG tablet Take 1 tablet (0.5 mg total) by mouth 2 (two) times daily as needed for anxiety. (Patient not taking: Reported on 05/03/2019)   . Lidocaine, Anorectal, 5 % GEL Apply 1 application topically 4 (four) times daily as needed. (Patient not taking: Reported on 04/18/2019)   . lidocaine-prilocaine (EMLA) cream Apply a small amount to port a cath site and cover with plastic wrap 1 hour prior to chemotherapy (Patient not taking: Reported on 04/18/2019)   . prochlorperazine (COMPAZINE) 10 MG tablet Take 1 tablet (10 mg total) by mouth every 6 (six) hours as needed for nausea or vomiting. (Patient not taking: Reported on 04/18/2019)   . [DISCONTINUED] HYDROcodone-acetaminophen (NORCO) 10-325 MG tablet Take 1 tablet by mouth every 4 (four) hours as needed. (Patient not taking: Reported on 05/03/2019)    Facility-Administered Encounter Medications as of 05/03/2019  Medication  . heparin lock flush 100 unit/mL  . sodium chloride flush (NS) 0.9 %  injection 10 mL    ALLERGIES:  Allergies  Allergen Reactions  . Sulfa Antibiotics Anaphylaxis    Per pt, can use creams with sulfa in it  . Sulfamethoxazole Other (See Comments)    Tongue swelling     PHYSICAL EXAM:  ECOG Performance status: 1  Vitals:   05/03/19 1011  BP: (!) 96/39  Pulse: 89  Resp: 18  Temp: (!) 97 F (36.1 C)  SpO2: 100%   There were no vitals filed for this visit.  Physical Exam Vitals reviewed.  Constitutional:      Appearance: Normal appearance.  Cardiovascular:     Rate and Rhythm: Normal rate and regular rhythm.     Heart sounds: Normal heart sounds.  Pulmonary:     Effort: Pulmonary effort is normal.     Breath sounds: Normal breath sounds.  Abdominal:     General: There is no distension.     Palpations: Abdomen is soft. There is no mass.  Musculoskeletal:        General: No swelling.  Skin:    General: Skin is warm.  Neurological:     General: No focal deficit present.     Mental Status: She is alert and oriented to person, place, and time.  Psychiatric:        Mood and Affect: Mood normal.        Behavior: Behavior normal.    Groin region skin breakdown has improved.  Skin breakdown in the perianal region is also improved.        LABORATORY DATA:  I have reviewed the labs as listed.  CBC    Component Value Date/Time   WBC 4.8 05/03/2019 0926   RBC 3.50 (L) 05/03/2019 0926   HGB 10.5 (L) 05/03/2019 0926   HCT 34.2 (L) 05/03/2019 0926   PLT 367 05/03/2019 0926   MCV 97.7 05/03/2019 0926   MCH 30.0 05/03/2019 0926   MCHC 30.7 05/03/2019 0926   RDW 18.9 (H) 05/03/2019 0926   LYMPHSABS 0.5 (L)  05/03/2019 0926   MONOABS 0.7 05/03/2019 0926   EOSABS 0.0 05/03/2019 0926   BASOSABS 0.0 05/03/2019 0926   CMP Latest Ref Rng & Units 05/03/2019 04/18/2019 03/31/2019  Glucose 70 - 99 mg/dL 172(H) 338(H) 128(H)  BUN 8 - 23 mg/dL 16 25(H) 33(H)  Creatinine 0.44 - 1.00 mg/dL 0.87 0.95 0.77  Sodium 135 - 145 mmol/L 139 133(L)  136  Potassium 3.5 - 5.1 mmol/L 3.2(L) 4.1 3.7  Chloride 98 - 111 mmol/L 104 96(L) 104  CO2 22 - 32 mmol/L 25 24 23   Calcium 8.9 - 10.3 mg/dL 8.6(L) 8.7(L) 8.8(L)  Total Protein 6.5 - 8.1 g/dL 6.3(L) 6.6 6.6  Total Bilirubin 0.3 - 1.2 mg/dL 0.5 0.4 0.6  Alkaline Phos 38 - 126 U/L 56 59 41  AST 15 - 41 U/L 22 26 17   ALT 0 - 44 U/L 15 22 16        DIAGNOSTIC IMAGING:  I have independently reviewed the scans and discussed with the patient.     ASSESSMENT & PLAN:   Anal cancer ([place]) 1.  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  Because of COVID-19, she did not seek medical attention.  She used OTC creams like Monistat. -She was evaluated by Dr. Constance Haw, biopsy of the internal anal mass and perianal ulceration on 01/26/2019. - Biopsy showed squamous cell carcinoma of both areas. - Physical examination at diagnosis showed ulcerated lesions around the anal orifice, predominantly on the right buttock cheek and left buttock cheek (right more than left).  There is a mass in the lower part of the anal canal towards the right side.  P 16 stain was requested. 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  Small left axillary lymph node measuring 4 mm with SUV of 3.2.  High left subpectoral lymph node 3 mm with SUV of 3.1. -XRT started on 02/27/2019.  Received day 1 of 5-FU and mitomycin on 03/02/2019. -Day 28 of treatment on March 31, 2019 with dose reduction of mitomycin. -She completed RT on April 07, 2019. -On examination today there is erythema and skin breakdown of the groin region and perianal region.  Groin region has improved tremendously.  Perianal region also improved.  She will continue wound care services. -We will make a referral to outpatient wound care clinic.  She is able to be mobile more at this time. -We have  reviewed her labs.  Blood pressure is 82/48.  I have told her to discontinue lisinopril temporarily. -I will see her back in 3 weeks to reevaluate the wound.  I plan to set up for PET scan 12 weeks after completion of radiation.  2.  Perianal pain: -She will continue hydrocodone 10/325 mg every 4 hours as needed.  3.  Diabetes: -She is on Tresiba 76 units in the morning and Invokana 300 mg daily. 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  4.  Anxiety: -She is using Xanax 0.5 mg twice daily as needed.  He is well controlled.   Total time spent is 25 minutes with more than 50% of the time spent face-to-face discussing treatment plan, counseling and coordination of care.  Orders placed this encounter:  No orders of the defined types were placed in this encounter.     Derek Jack, MD [place] [phone]

## 2019-05-03 NOTE — Patient Instructions (Addendum)
[place] at [facility] Discharge Instructions  You were seen today by Dr. Delton Coombes. He went over your recent lab results. He will refer you to wound care. He will see you back in 3 weeks for follow up.   Thank you for choosing [place] at [facility] to provide your oncology and hematology care.  To afford each patient quality time with our provider, please arrive at least 15 minutes before your scheduled appointment time.   If you have a lab appointment with the [facility] please come in thru the  Main Entrance and check in at the main information desk  You need to re-schedule your appointment should you arrive 10 or more minutes late.  We strive to give you quality time with our providers, and arriving late affects you and other patients whose appointments are after yours.  Also, if you no show three or more times for appointments you may be dismissed from the clinic at the providers discretion.     Again, thank you for choosing [facility].  Our hope is that these requests will decrease the amount of time that you wait before being seen by our physicians.       _____________________________________________________________  Should you have questions after your visit to [facility], please contact our office at [phone] between the hours of 8:00 a.m. and 4:30 p.m.  Voicemails left after 4:00 p.m. will not be returned until the following business day.  For prescription refill requests, have your pharmacy contact our office and allow 72 hours.    Cancer Center Support Programs:   > Cancer Support Group  2nd Tuesday of the month 1pm-2pm, Journey Room

## 2019-05-03 NOTE — Progress Notes (Signed)
I met with patient today during the visit with Dr. Delton Coombes. Her daughter, Jacqueline Bird, was with her today. She is very hopeful that she will continue to heal.  She was concerned about her wounds.  I explained that outpatient wound care would be best next option because they can help heal things with different treatments.  Things that are not available to home health.  She verbalizes understanding.  I explained that it is important for her to not decline to appointments.  She states that she wants to get better and will make the appointment when they call.

## 2019-05-04 ENCOUNTER — Telehealth: Payer: Self-pay | Admitting: Radiation Oncology

## 2019-05-04 DIAGNOSIS — C210 Malignant neoplasm of anus, unspecified: Secondary | ICD-10-CM

## 2019-05-04 NOTE — Telephone Encounter (Signed)
  Radiation Oncology         [phone]) [phone] ________________________________  Name: Reginald Liuzzi MRN: [id number]  Date of Service: 05/04/2019  DOB: 06-21-1950  Post Treatment Telephone Note  Diagnosis:   Stage IIIC, cT3N1cM0  Squamous Cell Carcinoma of the Anus.  Interval Since Last Radiation:  5 weeks   02/27/2019-04/07/2019: The anus and regional nodes were treated to 54 Gy in 30 fractions.  Narrative:  The patient was contacted today for routine follow-up. During treatment she did very well with radiotherapy and did not have significant desquamation. She reports she is still having a lot of skin difficulties and pain in the anal region. She's using silvadene which seems to help and has the help of a home health nurse to aid in her keeping the anal area clean since she only has one upper extremity. She reports she needs another prescription for more pain medication which I will send in.   Impression/Plan: 1. Stage IIIC, cT3N1cM0  Squamous Cell Carcinoma of the Anus. The patient has been doing well since completion of radiotherapy. We discussed that we would be happy to continue to follow her with annual exams to supplement GYN care as well but she will also continue to follow up with Dr. Delton Coombes in medical oncology and Dr. Constance Haw for anoscopy in [place]. She will also need to begin using vaginal dilators in the next month. I'll coordinate an appt with PT either at [facility] or if possible in [place] if they can do pelvic floor rehab there.    Carola Rhine, PAC

## 2019-05-10 ENCOUNTER — Encounter (HOSPITAL_COMMUNITY): Payer: Self-pay | Admitting: Hematology

## 2019-05-10 MED ORDER — HYDROCODONE-ACETAMINOPHEN 10-325 MG PO TABS: 1.0000 | ORAL_TABLET | ORAL | 0 refills | Status: DC | PRN

## 2019-05-10 NOTE — Assessment & Plan Note (Addendum)
1.  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  Because of COVID-19, she did not seek medical attention.  She used OTC creams like Monistat. -She was evaluated by Dr. Constance Haw, biopsy of the internal anal mass and perianal ulceration on 01/26/2019. - Biopsy showed squamous cell carcinoma of both areas. - Physical examination at diagnosis showed ulcerated lesions around the anal orifice, predominantly on the right buttock cheek and left buttock cheek (right more than left).  There is a mass in the lower part of the anal canal towards the right side.  P 16 stain was requested. 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  Small left axillary lymph node measuring 4 mm with SUV of 3.2.  High left subpectoral lymph node 3 mm with SUV of 3.1. -XRT started on 02/27/2019.  Received day 1 of 5-FU and mitomycin on 03/02/2019. -Day 28 of treatment on March 31, 2019 with dose reduction of mitomycin. -She completed RT on April 07, 2019. -On examination today there is erythema and skin breakdown of the groin region and perianal region.  Groin region has improved tremendously.  Perianal region also improved.  She will continue wound care services. -We will make a referral to outpatient wound care clinic.  She is able to be mobile more at this time. -We have reviewed her labs.  Blood pressure is 82/48.  I have told her to discontinue lisinopril temporarily. -I will see her back in 3 weeks to reevaluate the wound.  I plan to set up for PET scan 12 weeks after completion of radiation.  2.  Perianal pain: -She will continue hydrocodone 10/325 mg every 4 hours as needed.  3.  Diabetes: -She is on Tresiba 76 units in the morning and Invokana 300 mg daily. 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  4.  Anxiety: -She is  using Xanax 0.5 mg twice daily as needed.  He is well controlled.

## 2019-05-15 ENCOUNTER — Other Ambulatory Visit (HOSPITAL_COMMUNITY): Payer: Self-pay | Admitting: *Deleted

## 2019-05-15 ENCOUNTER — Telehealth: Payer: Self-pay | Admitting: *Deleted

## 2019-05-15 MED ORDER — ALPRAZOLAM 0.5 MG PO TABS: 0.5000 mg | ORAL_TABLET | Freq: Two times a day (BID) | ORAL | 0 refills | Status: DC | PRN

## 2019-05-15 NOTE — Telephone Encounter (Signed)
CALLED PATIENT TO INFORM OF 1 YR. FU WITH ALISON PERKINS ON 06/14/19 @ 1:30 PM AND SHE ALSO HAS A PT APPT. ON 05/30/19 @ 11:45 AM @ [place] @ [facility], I ALSO INFORMED HER THAT I CALLED DR. BRIDGES' OFFICE TO ARRANGE AN APPT. FOR HER, BUT THEY TOLD ME THAT THEIR SCHEDULE IS OUT OPEN IN FEB. AND MARCH, SHE ASKED ME TO WAIT UNTIL AFTER THE FIRST ON 2021 TO MAKE THIS APPT., SPOKE WITH PATIENT AND NOTIFIED HER OF THIS, PATIENT VERIFIED UNDERSTANDING THIS

## 2019-05-21 ENCOUNTER — Emergency Department (HOSPITAL_COMMUNITY): Payer: Medicare HMO

## 2019-05-21 ENCOUNTER — Encounter (HOSPITAL_COMMUNITY): Payer: Self-pay | Admitting: Emergency Medicine

## 2019-05-21 ENCOUNTER — Other Ambulatory Visit: Payer: Self-pay

## 2019-05-21 ENCOUNTER — Inpatient Hospital Stay (HOSPITAL_COMMUNITY)
Admission: EM | Admit: 2019-05-21 | Discharge: 2019-05-27 | DRG: 291 | Disposition: A | Discharge status: Home Health | Payer: Medicare HMO | Attending: Internal Medicine | Admitting: Internal Medicine

## 2019-05-21 DIAGNOSIS — Z79899 Other long term (current) drug therapy: Secondary | ICD-10-CM | POA: Diagnosis not present

## 2019-05-21 DIAGNOSIS — R0902 Hypoxemia: Secondary | ICD-10-CM

## 2019-05-21 DIAGNOSIS — E1165 Type 2 diabetes mellitus with hyperglycemia: Secondary | ICD-10-CM | POA: Diagnosis present

## 2019-05-21 DIAGNOSIS — Z89212 Acquired absence of left upper limb below elbow: Secondary | ICD-10-CM | POA: Diagnosis not present

## 2019-05-21 DIAGNOSIS — I5031 Acute diastolic (congestive) heart failure: Secondary | ICD-10-CM | POA: Diagnosis present

## 2019-05-21 DIAGNOSIS — Z79891 Long term (current) use of opiate analgesic: Secondary | ICD-10-CM

## 2019-05-21 DIAGNOSIS — Z808 Family history of malignant neoplasm of other organs or systems: Secondary | ICD-10-CM | POA: Diagnosis not present

## 2019-05-21 DIAGNOSIS — F419 Anxiety disorder, unspecified: Secondary | ICD-10-CM | POA: Diagnosis present

## 2019-05-21 DIAGNOSIS — I081 Rheumatic disorders of both mitral and tricuspid valves: Secondary | ICD-10-CM | POA: Diagnosis present

## 2019-05-21 DIAGNOSIS — R001 Bradycardia, unspecified: Secondary | ICD-10-CM | POA: Diagnosis present

## 2019-05-21 DIAGNOSIS — I361 Nonrheumatic tricuspid (valve) insufficiency: Secondary | ICD-10-CM | POA: Diagnosis not present

## 2019-05-21 DIAGNOSIS — E785 Hyperlipidemia, unspecified: Secondary | ICD-10-CM | POA: Diagnosis present

## 2019-05-21 DIAGNOSIS — Z9842 Cataract extraction status, left eye: Secondary | ICD-10-CM

## 2019-05-21 DIAGNOSIS — Z794 Long term (current) use of insulin: Secondary | ICD-10-CM | POA: Diagnosis not present

## 2019-05-21 DIAGNOSIS — Z803 Family history of malignant neoplasm of breast: Secondary | ICD-10-CM | POA: Diagnosis not present

## 2019-05-21 DIAGNOSIS — Z8249 Family history of ischemic heart disease and other diseases of the circulatory system: Secondary | ICD-10-CM | POA: Diagnosis not present

## 2019-05-21 DIAGNOSIS — Z83511 Family history of glaucoma: Secondary | ICD-10-CM

## 2019-05-21 DIAGNOSIS — I10 Essential (primary) hypertension: Secondary | ICD-10-CM

## 2019-05-21 DIAGNOSIS — Z9841 Cataract extraction status, right eye: Secondary | ICD-10-CM | POA: Diagnosis not present

## 2019-05-21 DIAGNOSIS — Z923 Personal history of irradiation: Secondary | ICD-10-CM

## 2019-05-21 DIAGNOSIS — Z20822 Contact with and (suspected) exposure to covid-19: Secondary | ICD-10-CM | POA: Diagnosis present

## 2019-05-21 DIAGNOSIS — C210 Malignant neoplasm of anus, unspecified: Secondary | ICD-10-CM | POA: Diagnosis present

## 2019-05-21 DIAGNOSIS — J9601 Acute respiratory failure with hypoxia: Secondary | ICD-10-CM | POA: Diagnosis present

## 2019-05-21 DIAGNOSIS — I509 Heart failure, unspecified: Secondary | ICD-10-CM

## 2019-05-21 DIAGNOSIS — Z9221 Personal history of antineoplastic chemotherapy: Secondary | ICD-10-CM

## 2019-05-21 DIAGNOSIS — E876 Hypokalemia: Secondary | ICD-10-CM | POA: Diagnosis present

## 2019-05-21 DIAGNOSIS — I110 Hypertensive heart disease with heart failure: Secondary | ICD-10-CM | POA: Diagnosis present

## 2019-05-21 DIAGNOSIS — Z833 Family history of diabetes mellitus: Secondary | ICD-10-CM

## 2019-05-21 DIAGNOSIS — Z8541 Personal history of malignant neoplasm of cervix uteri: Secondary | ICD-10-CM | POA: Diagnosis not present

## 2019-05-21 DIAGNOSIS — Z882 Allergy status to sulfonamides status: Secondary | ICD-10-CM

## 2019-05-21 DIAGNOSIS — I5033 Acute on chronic diastolic (congestive) heart failure: Secondary | ICD-10-CM | POA: Diagnosis present

## 2019-05-21 DIAGNOSIS — E1169 Type 2 diabetes mellitus with other specified complication: Secondary | ICD-10-CM | POA: Diagnosis present

## 2019-05-21 DIAGNOSIS — R0600 Dyspnea, unspecified: Secondary | ICD-10-CM

## 2019-05-21 DIAGNOSIS — R9431 Abnormal electrocardiogram [ECG] [EKG]: Secondary | ICD-10-CM | POA: Diagnosis present

## 2019-05-21 DIAGNOSIS — I340 Nonrheumatic mitral (valve) insufficiency: Secondary | ICD-10-CM | POA: Diagnosis not present

## 2019-05-21 DIAGNOSIS — Z86711 Personal history of pulmonary embolism: Secondary | ICD-10-CM

## 2019-05-21 DIAGNOSIS — Z9889 Other specified postprocedural states: Secondary | ICD-10-CM

## 2019-05-21 DIAGNOSIS — J90 Pleural effusion, not elsewhere classified: Secondary | ICD-10-CM | POA: Diagnosis not present

## 2019-05-21 LAB — CBC WITH DIFFERENTIAL/PLATELET
Abs Immature Granulocytes: 0.04 10*3/uL (ref 0.00–0.07)
Basophils Absolute: 0 10*3/uL (ref 0.0–0.1)
Basophils Relative: 1 %
Eosinophils Absolute: 0.1 10*3/uL (ref 0.0–0.5)
Eosinophils Relative: 2 %
HCT: 35.5 % — ABNORMAL LOW (ref 36.0–46.0)
Hemoglobin: 10.8 g/dL — ABNORMAL LOW (ref 12.0–15.0)
Immature Granulocytes: 1 %
Lymphocytes Relative: 17 %
Lymphs Abs: 1.1 10*3/uL (ref 0.7–4.0)
MCH: 31.1 pg (ref 26.0–34.0)
MCHC: 30.4 g/dL (ref 30.0–36.0)
MCV: 102.3 fL — ABNORMAL HIGH (ref 80.0–100.0)
Monocytes Absolute: 0.7 10*3/uL (ref 0.1–1.0)
Monocytes Relative: 12 %
Neutro Abs: 4.3 10*3/uL (ref 1.7–7.7)
Neutrophils Relative %: 67 %
Platelets: 377 10*3/uL (ref 150–400)
RBC: 3.47 MIL/uL — ABNORMAL LOW (ref 3.87–5.11)
RDW: 17.5 % — ABNORMAL HIGH (ref 11.5–15.5)
WBC: 6.3 10*3/uL (ref 4.0–10.5)
nRBC: 0 % (ref 0.0–0.2)

## 2019-05-21 LAB — TROPONIN I (HIGH SENSITIVITY)
Troponin I (High Sensitivity): 43 ng/L — ABNORMAL HIGH (ref <18)
Troponin I (High Sensitivity): 42 ng/L — ABNORMAL HIGH (ref <18)

## 2019-05-21 LAB — RESPIRATORY PANEL BY RT PCR (FLU A&B, COVID)
Influenza A by PCR: NEGATIVE
Influenza B by PCR: NEGATIVE
SARS Coronavirus 2 by RT PCR: NEGATIVE

## 2019-05-21 LAB — BASIC METABOLIC PANEL
Anion gap: 9 (ref 5–15)
BUN: 20 mg/dL (ref 8–23)
CO2: 25 mmol/L (ref 22–32)
Calcium: 8.1 mg/dL — ABNORMAL LOW (ref 8.9–10.3)
Chloride: 103 mmol/L (ref 98–111)
Creatinine, Ser: 0.57 mg/dL (ref 0.44–1.00)
GFR calc Af Amer: >60 mL/min (ref >60)
GFR calc non Af Amer: >60 mL/min (ref >60)
Glucose, Bld: 174 mg/dL — ABNORMAL HIGH (ref 70–99)
Potassium: 3.1 mmol/L — ABNORMAL LOW (ref 3.5–5.1)
Sodium: 137 mmol/L (ref 135–145)

## 2019-05-21 LAB — MAGNESIUM: Magnesium: 2 mg/dL (ref 1.7–2.4)

## 2019-05-21 LAB — LACTIC ACID, PLASMA: Lactic Acid, Venous: 1.3 mmol/L (ref 0.5–1.9)

## 2019-05-21 LAB — BRAIN NATRIURETIC PEPTIDE: B Natriuretic Peptide: 683 pg/mL — ABNORMAL HIGH (ref 0.0–100.0)

## 2019-05-21 LAB — CBG MONITORING, ED: Glucose-Capillary: 107 mg/dL — ABNORMAL HIGH (ref 70–99)

## 2019-05-21 LAB — POC SARS CORONAVIRUS 2 AG -  ED: SARS Coronavirus 2 Ag: NEGATIVE

## 2019-05-21 MED ORDER — ONDANSETRON HCL 4 MG/2ML IJ SOLN: 4.0000 mg | Freq: Four times a day (QID) | INTRAMUSCULAR | Status: DC | PRN

## 2019-05-21 MED ORDER — INSULIN ASPART 100 UNIT/ML ~~LOC~~ SOLN
0.0000 [IU] | Freq: Three times a day (TID) | SUBCUTANEOUS | Status: DC
  Administered 2019-05-23: 3 [IU] via SUBCUTANEOUS
  Administered 2019-05-23 – 2019-05-27 (×4): 2 [IU] via SUBCUTANEOUS

## 2019-05-21 MED ORDER — ASPIRIN EC 81 MG PO TBEC
81.0000 mg | DELAYED_RELEASE_TABLET | Freq: Every day | ORAL | Status: DC
  Administered 2019-05-22 – 2019-05-27 (×6): 81 mg via ORAL
  Filled 2019-05-21 (×6): qty 1

## 2019-05-21 MED ORDER — FUROSEMIDE 10 MG/ML IJ SOLN
40.0000 mg | Freq: Once | INTRAMUSCULAR | Status: AC
  Administered 2019-05-21: 40 mg via INTRAVENOUS
  Filled 2019-05-21: qty 4

## 2019-05-21 MED ORDER — IOHEXOL 350 MG/ML SOLN
100.0000 mL | Freq: Once | INTRAVENOUS | Status: AC | PRN
  Administered 2019-05-21: 100 mL via INTRAVENOUS

## 2019-05-21 MED ORDER — HYDROCODONE-ACETAMINOPHEN 10-325 MG PO TABS
1.0000 | ORAL_TABLET | Freq: Four times a day (QID) | ORAL | Status: DC | PRN
  Administered 2019-05-23 – 2019-05-27 (×13): 1 via ORAL
  Filled 2019-05-21 (×14): qty 1

## 2019-05-21 MED ORDER — SIMVASTATIN 20 MG PO TABS
40.0000 mg | ORAL_TABLET | Freq: Every day | ORAL | Status: DC
  Administered 2019-05-22 – 2019-05-27 (×6): 40 mg via ORAL
  Filled 2019-05-21 (×5): qty 2
  Filled 2019-05-21: qty 4

## 2019-05-21 MED ORDER — SODIUM CHLORIDE 0.9% FLUSH
3.0000 mL | Freq: Two times a day (BID) | INTRAVENOUS | Status: DC
  Administered 2019-05-22 – 2019-05-27 (×10): 3 mL via INTRAVENOUS

## 2019-05-21 MED ORDER — FUROSEMIDE 10 MG/ML IJ SOLN: 40.0000 mg | Freq: Every day | INTRAMUSCULAR | Status: DC

## 2019-05-21 MED ORDER — ACETAMINOPHEN 325 MG PO TABS
650.0000 mg | ORAL_TABLET | ORAL | Status: DC | PRN
  Administered 2019-05-24: 650 mg via ORAL
  Filled 2019-05-21: qty 2

## 2019-05-21 MED ORDER — LORAZEPAM 2 MG/ML IJ SOLN
1.0000 mg | Freq: Once | INTRAMUSCULAR | Status: AC
  Administered 2019-05-21: 1 mg via INTRAVENOUS
  Filled 2019-05-21: qty 1

## 2019-05-21 MED ORDER — SODIUM CHLORIDE 0.9 % IV SOLN: 250.0000 mL | INTRAVENOUS | Status: DC | PRN

## 2019-05-21 MED ORDER — DOCUSATE SODIUM 100 MG PO CAPS
100.0000 mg | ORAL_CAPSULE | Freq: Every day | ORAL | Status: DC
  Administered 2019-05-21 – 2019-05-26 (×6): 100 mg via ORAL
  Filled 2019-05-21 (×6): qty 1

## 2019-05-21 MED ORDER — VORTIOXETINE HBR 10 MG PO TABS
10.0000 mg | ORAL_TABLET | Freq: Every day | ORAL | Status: DC
  Administered 2019-05-23 – 2019-05-27 (×5): 10 mg via ORAL
  Filled 2019-05-21 (×10): qty 1

## 2019-05-21 MED ORDER — ALPRAZOLAM 0.5 MG PO TABS
0.5000 mg | ORAL_TABLET | Freq: Two times a day (BID) | ORAL | Status: DC | PRN
  Administered 2019-05-22: 0.5 mg via ORAL
  Filled 2019-05-21 (×2): qty 1

## 2019-05-21 MED ORDER — INSULIN GLARGINE 100 UNIT/ML ~~LOC~~ SOLN
60.0000 [IU] | Freq: Every day | SUBCUTANEOUS | Status: DC
  Administered 2019-05-21: 60 [IU] via SUBCUTANEOUS
  Filled 2019-05-21 (×2): qty 0.6

## 2019-05-21 MED ORDER — SODIUM CHLORIDE 0.9% FLUSH: 3.0000 mL | INTRAVENOUS | Status: DC | PRN

## 2019-05-21 MED ORDER — ENOXAPARIN SODIUM 40 MG/0.4ML ~~LOC~~ SOLN
40.0000 mg | SUBCUTANEOUS | Status: DC
  Administered 2019-05-21 – 2019-05-26 (×6): 40 mg via SUBCUTANEOUS
  Filled 2019-05-21 (×6): qty 0.4

## 2019-05-21 MED ORDER — GABAPENTIN 100 MG PO CAPS
100.0000 mg | ORAL_CAPSULE | Freq: Three times a day (TID) | ORAL | Status: DC
  Administered 2019-05-21 – 2019-05-27 (×17): 100 mg via ORAL
  Filled 2019-05-21 (×17): qty 1

## 2019-05-21 MED ORDER — POTASSIUM CHLORIDE 20 MEQ/15ML (10%) PO SOLN
40.0000 meq | Freq: Every day | ORAL | Status: DC
  Administered 2019-05-23 – 2019-05-27 (×5): 40 meq via ORAL
  Filled 2019-05-21 (×5): qty 30

## 2019-05-21 NOTE — ED Provider Notes (Signed)
Hx rectal and cervical cancer here with SOB.  Prior hx of PE.  Is requiring 2L of supplemental O2 which is new.  Awaits chest CTA, labs and admission for hypoxia requiring supplemental O2.   5:44 PM Patient woke up, appears anxious, requesting to go home.  On examination, she has crackles on her lungs, BNP is elevated at 683, and chest x-ray shows vascular congestion and interstitial edema.  No history of CHF.  Will give Lasix, Ativan, currently awaits chest CT angiogram.  6:53 PM Fortunately chest CT without evidence of PE, however finding is consistent with CHF exacerbation.  IN the setting of new CHF exacerbation and new oxygen requirement, pt was consulted for admission  Appreciate consultation from [facility] who agrees to see and admit pt for further care.   BP (!) 140/50   Pulse (!) 51   Temp 98.6 F (37 C) (Oral)   Resp (!) 31   Ht 5' 4"  (1.626 m)   Wt 85.3 kg   SpO2 (!) 84%   BMI 32.27 kg/m   Results for orders placed or performed during the hospital encounter of 05/21/19  Respiratory Panel by RT PCR (Flu A&B, Covid) - Nasopharyngeal Swab   Specimen: Nasopharyngeal Swab  Result Value Ref Range   SARS Coronavirus 2 by RT PCR NEGATIVE NEGATIVE   Influenza A by PCR NEGATIVE NEGATIVE   Influenza B by PCR NEGATIVE NEGATIVE  Basic metabolic panel  Result Value Ref Range   Sodium 137 135 - 145 mmol/L   Potassium 3.1 (L) 3.5 - 5.1 mmol/L   Chloride 103 98 - 111 mmol/L   CO2 25 22 - 32 mmol/L   Glucose, Bld 174 (H) 70 - 99 mg/dL   BUN 20 8 - 23 mg/dL   Creatinine, Ser 0.57 0.44 - 1.00 mg/dL   Calcium 8.1 (L) 8.9 - 10.3 mg/dL   GFR calc non Af Amer >60 >60 mL/min   GFR calc Af Amer >60 >60 mL/min   Anion gap 9 5 - 15  CBC with Differential  Result Value Ref Range   WBC 6.3 4.0 - 10.5 K/uL   RBC 3.47 (L) 3.87 - 5.11 MIL/uL   Hemoglobin 10.8 (L) 12.0 - 15.0 g/dL   HCT 35.5 (L) 36.0 - 46.0 %   MCV 102.3 (H) 80.0 - 100.0 fL   MCH 31.1 26.0 - 34.0 pg   MCHC 30.4 30.0  - 36.0 g/dL   RDW 17.5 (H) 11.5 - 15.5 %   Platelets 377 150 - 400 K/uL   nRBC 0.0 0.0 - 0.2 %   Neutrophils Relative % 67 %   Neutro Abs 4.3 1.7 - 7.7 K/uL   Lymphocytes Relative 17 %   Lymphs Abs 1.1 0.7 - 4.0 K/uL   Monocytes Relative 12 %   Monocytes Absolute 0.7 0.1 - 1.0 K/uL   Eosinophils Relative 2 %   Eosinophils Absolute 0.1 0.0 - 0.5 K/uL   Basophils Relative 1 %   Basophils Absolute 0.0 0.0 - 0.1 K/uL   Immature Granulocytes 1 %   Abs Immature Granulocytes 0.04 0.00 - 0.07 K/uL  Brain natriuretic peptide  Result Value Ref Range   B Natriuretic Peptide 683.0 (H) 0.0 - 100.0 pg/mL  Lactic acid, plasma  Result Value Ref Range   Lactic Acid, Venous 1.3 0.5 - 1.9 mmol/L  POC SARS Coronavirus 2 Ag-ED - Nasal Swab (BD Veritor Kit)  Result Value Ref Range   SARS Coronavirus 2 Ag NEGATIVE NEGATIVE  CT Angio Chest PE W/Cm &/Or Wo Cm  Result Date: 05/21/2019 CLINICAL DATA:  History of cervical carcinoma with shortness of breath and hypoxia EXAM: CT ANGIOGRAPHY CHEST WITH CONTRAST TECHNIQUE: Multidetector CT imaging of the chest was performed using the standard protocol during bolus administration of intravenous contrast. Multiplanar CT image reconstructions and MIPs were obtained to evaluate the vascular anatomy. CONTRAST:  191m OMNIPAQUE IOHEXOL 350 MG/ML SOLN COMPARISON:  Chest x-ray from earlier in the same day. FINDINGS: Cardiovascular: Thoracic aorta demonstrates atherosclerotic calcifications without aneurysmal dilatation or dissection. No significant cardiac enlargement is seen. No pericardial effusion is noted. The pulmonary artery shows a normal branching pattern without focal filling defect to suggest pulmonary embolism. Mediastinum/Nodes: Thoracic inlet is within normal limits. No sizable hilar or mediastinal adenopathy is noted. The esophagus as visualized is within normal limits. Lungs/Pleura: Large bilateral pleural effusions are noted right greater than left with  associated lower lobe atelectasis. Diffuse edema is identified with patchy ground-glass changes related to the underlying edema given the negative COVID-19 test. Upper Abdomen: Visualized upper abdomen is within normal limits. Musculoskeletal: Degenerative changes of the thoracic spine are seen. No acute rib abnormality is seen. Review of the MIP images confirms the above findings. IMPRESSION: Changes consistent with CHF with edema and large bilateral pleural effusions. Associated atelectatic changes are noted. Aortic Atherosclerosis (ICD10-I70.0). Electronically Signed   By: MInez CatalinaM.D.   On: 05/21/2019 18:30   DG Chest Port 1 View  Result Date: 05/21/2019 CLINICAL DATA:  Shortness of breath and hypoxia, history of cervical carcinoma EXAM: PORTABLE CHEST 1 VIEW COMPARISON:  02/08/19 FINDINGS: Cardiac shadows within normal limits. Left chest wall port is again seen and stable. The lungs are well aerated with central vascular congestion and mild interstitial edema. No focal infiltrate is seen. IMPRESSION: Changes of vascular congestion and interstitial edema. Electronically Signed   By: MInez CatalinaM.D.   On: 05/21/2019 15:49      TDomenic Moras PA-C 05/21/19 1Trinna Balloon MD 05/23/19 1037

## 2019-05-21 NOTE — H&P (Signed)
TRH H&P    Patient Demographics:    Jacqueline Bird, is a 69 y.o. female  MRN: [id number]  DOB - 1950-12-25  Admit Date - 05/21/2019  Referring MD/NP/PA: Gertie Fey  Outpatient Primary MD for the patient is Lucia Gaskins, MD  Patient coming from: home  Chief complaint- shortness of breath   HPI:    Jacqueline Bird  is a 69 y.o. female,with history of gynecological cancer, finished radiation and chemotherapy the week of Nov 20th, presents to the ED with a chief complaint of shortness of breath.  Patient reports that the shortness of breath started last night.  It was gradual in onset and progressively worsening all night.  Patient cannot lie flat and reports that she used 5 pillows to prop her up last night.  This is not normal for her she is usually able to lay flat.  Patient reports an associated shallow dry cough.  She also has an associated wheeze.  She reports that she has had a history of wheeze in the past with history of bronchitis.  Patient denies any recent fever.  Patient admits that she has just finished radiation and chemotherapy in November 20, and that she had 3 fevers since then the last one being last week.  Patient denies any chest pain.  She admits to palpitations with exertion.  When questioned about the shortness of breath being exertional patient seems confused.  She reports that she is not able to ambulate due to extreme fatigue and malaise, so she would not know if the shortness of breath was exertional.  She also goes on to say that the shortness of breath that started last night.  Patient has no sick contacts.  Patient lives alone although her children drop into check on her.  Patient has no history of CHF.  1 of patient's chemotherapy medications was fluorouracil which does have cardiotoxic side effects.  Patient received Ativan in the ER.  She reports that she does have a history of anxiety.  She  reports that Dr. Raliegh Ip has given her medication for anxiety at home with, but she has not been taking it.  Med rec reveals that she is on Trintellix, trazodone, and Xanax.  ED course Blood pressure 97/42, pulse 49, respiratory rate 24, O2 sat 92% on 6 L high flow nasal cannula CTA - Changes consistent with CHF with edema and large bilateral pleural effusions. Associated atelectatic changes are noted. cxr Changes of vascular congestion and interstitial edema. bnp 683 No leukocytosis with a WBC of 6.3 Hemoglobin 10.8 Hypokalemic with a potassium of 3.1 BUN and creatinine are 20 and 0.57 respectively Hyperglycemic at 174 Lasix 40 mg IV given one-time Ativan 1 mg given one-time      Review of systems:    In addition to the HPI above,  No Fever-chills, positive for extreme malaise and fatigue No Headache, No changes with Vision or hearing, No problems swallowing food or Liquids, No Chest pain, Positive for cough and dyspnea as described above No Abdominal pain, No Nausea or Vomiting, Passing "blisters" in  stool s/p radiation No hematuria No new skin rashes or bruises, No new joints pains-aches,  Generalized weakness with no tingling, numbness in any extremity, No recent weight gain or loss, No polyuria, polydypsia or polyphagia,  All other systems reviewed and are negative.    Past History of the following :    Past Medical History:  Diagnosis Date  . 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 Cervical cancer ([place])   . Depression   . Diabetes mellitus without complication ([place])   . History of cervical cancer   . Hypertension       Past Surgical History:  Procedure Laterality Date  . CATARACT EXTRACTION, BILATERAL Right 05/2017  . CERVICAL CONE BIOPSY    . HAND AMPUTATION Left   . PORTACATH PLACEMENT Left 02/08/2019   Procedure: INSERTION PORT-A-CATH;  Surgeon: Virl Cagey, MD;  Location: [facility];  Service: General;  Laterality: Left;  . RECTAL BIOPSY  01/26/2019     Procedure: BIOPSY OF ANAL MASS;  Surgeon: Virl Cagey, MD;  Location: [facility];  Service: General;;  . RECTAL EXAM UNDER ANESTHESIA N/A 01/26/2019   Procedure: RECTAL EXAM UNDER ANESTHESIA;  Surgeon: Virl Cagey, MD;  Location: [facility];  Service: General;  Laterality: N/A;      Social History:      Social History   Tobacco Use  . Smoking status: Never Smoker  . Smokeless tobacco: Never Used  Substance Use Topics  . Alcohol use: Not Currently       Family History :     Family History  Problem Relation Age of Onset  . Cataracts Mother   . Glaucoma Mother   . Heart disease Mother   . Diabetes Father   . Heart attack Father   . Cataracts Sister   . Lung disease Sister   . Cataracts Brother   . Diabetes Brother   . Heart disease Brother   . Macular degeneration Maternal Aunt   . Diabetes Maternal Grandfather   . Heart disease Sister   . Diabetes Sister   . Throat cancer Son   . Breast cancer Niece   . Breast cancer Niece   . Amblyopia Neg Hx   . Blindness Neg Hx   . Retinal detachment Neg Hx   . Strabismus Neg Hx   . Retinitis pigmentosa Neg Hx       Home Medications:   Prior to Admission medications   Medication Sig Start Date End Date Taking? Authorizing Provider  acetaminophen (TYLENOL) 500 MG tablet Take 1,000 mg by mouth every 8 (eight) hours as needed (pain).    [provider]  ALPRAZolam Duanne Moron) 0.5 MG tablet Take 1 tablet (0.5 mg total) by mouth 2 (two) times daily as needed for anxiety. 05/15/19   Lockamy, Randi L, NP-C  ciprofloxacin (CIPRO) 500 MG tablet Take 500 mg by mouth 2 (two) times daily. 04/28/19   [provider]  docusate sodium (COLACE) 100 MG capsule Take 1 capsule (100 mg total) by mouth 2 (two) times daily. Patient taking differently: Take 100 mg by mouth at bedtime.  01/26/19 01/26/20  Virl Cagey, MD  fluorouracil CALGB 82993 in sodium chloride 0.9 % 150 mL Inject into the vein over 96 hr. Day 1 and  Day 28 03/06/19   [provider]  gabapentin (NEURONTIN) 100 MG capsule Take 100 mg by mouth 3 (three) times daily. 01/09/19   [provider]  HYDROcodone-acetaminophen (NORCO) 10-325 MG tablet  Take 1 tablet by mouth every 4 (four) hours as needed. 05/10/19   Hayden Pedro, PA-C  INVOKANA 300 MG TABS tablet Take 300 mg by mouth daily. 01/09/19   [provider]  Lidocaine, Anorectal, 5 % GEL Apply 1 application topically 4 (four) times daily as needed. Patient not taking: Reported on 04/18/2019 01/26/19   Virl Cagey, MD  lidocaine-prilocaine (EMLA) cream Apply a small amount to port a cath site and cover with plastic wrap 1 hour prior to chemotherapy Patient not taking: Reported on 04/18/2019 02/27/19   Derek Jack, MD  lisinopril (ZESTRIL) 20 MG tablet Take 20 mg by mouth daily. 12/11/18   [provider]  MITOMYCIN IV Inject into the vein. Day 1 and Day 28 03/06/19   [provider]  nabumetone (RELAFEN) 750 MG tablet Take 750 mg by mouth 2 (two) times daily. 01/09/19   [provider]  prochlorperazine (COMPAZINE) 10 MG tablet Take 1 tablet (10 mg total) by mouth every 6 (six) hours as needed for nausea or vomiting. Patient not taking: Reported on 04/18/2019 02/27/19   Derek Jack, MD  silver sulfADIAZINE (SILVADENE) 1 % cream Apply to affected area daily 05/03/19 05/02/20  Derek Jack, MD  simvastatin (ZOCOR) 40 MG tablet Take 40 mg by mouth daily.  12/12/18   [provider]  traZODone (DESYREL) 50 MG tablet Take 50 mg by mouth at bedtime. 01/09/19   [provider]  TRESIBA FLEXTOUCH 200 UNIT/ML SOPN Inject 76 Units into the skin every morning.  01/22/19   [provider]  TRINTELLIX 10 MG TABS tablet Take 10 mg by mouth daily. 01/09/19   [provider]  TRULICITY 6.28 ZM/6.2HU SOPN Inject 0.75 mg into the muscle every Sunday.  01/10/19   [provider]      Allergies:     Allergies  Allergen Reactions  . Sulfa Antibiotics Anaphylaxis    Per pt, can use creams with sulfa in it  . Sulfamethoxazole Other (See Comments)    Tongue swelling     Physical Exam:   Vitals  Blood pressure (!) 97/42, pulse (!) 49, temperature 98.6 F (37 C), temperature source Oral, resp. rate (!) 24, height 5' 4"  (1.626 m), weight 85.3 kg, SpO2 92 %.  1.  General: Lying supine in bed, visibly anxious -tearful  2. Psychiatric: Anxious and tearful but willing to answer questions  3. Neurologic: Cranial nerves II through XII are grossly intact no focal deficit on limited exam  4. HEENMT:  Head is atraumatic, normocephalic, pupils are reactive to light, EOMI, trachea is midline  5. Respiratory : Crackles in the lower lung fields bilaterally, upper lung fields with wheeze, no accessory muscle use  6. Cardiovascular : Rhythm is irregular, rate is tachycardic, no murmur  7. Gastrointestinal:  Abdomen is soft, nondistended, nontender  8. Skin:  No acute lesions on limited skin exam  9.Musculoskeletal:  2+ peripheral edema in the lower extremities bilaterally    Data Review:    CBC Recent Labs  Lab 05/21/19 1455  WBC 6.3  HGB 10.8*  HCT 35.5*  PLT 377  MCV 102.3*  MCH 31.1  MCHC 30.4  RDW 17.5*  LYMPHSABS 1.1  MONOABS 0.7  EOSABS 0.1  BASOSABS 0.0   ------------------------------------------------------------------------------------------------------------------  Results for orders placed or performed during the hospital encounter of 05/21/19 (from the past 48 hour(s))  Basic metabolic panel     Status: Abnormal   Collection Time: 05/21/19  2:55 PM  Result Value Ref Range   Sodium 137 135 - 145 mmol/L   Potassium 3.1 (L) 3.5 - 5.1 mmol/L   Chloride 103 98 - 111 mmol/L   CO2 25 22 - 32 mmol/L   Glucose, Bld 174 (H) 70 - 99 mg/dL   BUN 20 8 - 23 mg/dL   Creatinine, Ser 0.57 0.44 - 1.00 mg/dL   Calcium 8.1 (L) 8.9 - 10.3 mg/dL    GFR calc non Af Amer >60 >60 mL/min   GFR calc Af Amer >60 >60 mL/min   Anion gap 9 5 - 15    Comment: Performed at [facility], [address] [place], [place] [address]  CBC with Differential     Status: Abnormal   Collection Time: 05/21/19  2:55 PM  Result Value Ref Range   WBC 6.3 4.0 - 10.5 K/uL   RBC 3.47 (L) 3.87 - 5.11 MIL/uL   Hemoglobin 10.8 (L) 12.0 - 15.0 g/dL   HCT 35.5 (L) 36.0 - 46.0 %   MCV 102.3 (H) 80.0 - 100.0 fL   MCH 31.1 26.0 - 34.0 pg   MCHC 30.4 30.0 - 36.0 g/dL   RDW 17.5 (H) 11.5 - 15.5 %   Platelets 377 150 - 400 K/uL   nRBC 0.0 0.0 - 0.2 %   Neutrophils Relative % 67 %   Neutro Abs 4.3 1.7 - 7.7 K/uL   Lymphocytes Relative 17 %   Lymphs Abs 1.1 0.7 - 4.0 K/uL   Monocytes Relative 12 %   Monocytes Absolute 0.7 0.1 - 1.0 K/uL   Eosinophils Relative 2 %   Eosinophils Absolute 0.1 0.0 - 0.5 K/uL   Basophils Relative 1 %   Basophils Absolute 0.0 0.0 - 0.1 K/uL   Immature Granulocytes 1 %   Abs Immature Granulocytes 0.04 0.00 - 0.07 K/uL    Comment: Performed at [facility], [address] [place], [place] [address]  Brain natriuretic peptide     Status: Abnormal   Collection Time: 05/21/19  2:55 PM  Result Value Ref Range   B Natriuretic Peptide 683.0 (H) 0.0 - 100.0 pg/mL    Comment: Performed at [facility], [address] [place], [place] [address]  POC SARS Coronavirus 2 Ag-ED - Nasal Swab (BD Veritor Kit)     Status: None   Collection Time: 05/21/19  3:15 PM  Result Value Ref Range   SARS Coronavirus 2 Ag NEGATIVE NEGATIVE    Comment: (NOTE) SARS-CoV-2 antigen NOT DETECTED.  Negative results are presumptive.  Negative results do not preclude SARS-CoV-2 infection and should not be used as the sole basis for treatment or other patient management decisions, including infection  control decisions, particularly in the presence of clinical signs and  symptoms consistent with COVID-19, or in those who have been in contact with the virus.  Negative  results must be combined with clinical observations, patient history, and epidemiological information. The expected result is Negative. Fact Sheet for Patients: PodPark.tn Fact Sheet for Healthcare Providers: GiftContent.is This test is not yet approved or cleared by the [place] FDA and  has been authorized for detection and/or diagnosis of SARS-CoV-2 by FDA under an Emergency Use Authorization (EUA).  This EUA will remain in effect (meaning this test can be used) for the duration of  the COVID-19 de claration under Section 564(b)(1) of the Act, 21 U.S.C. section 360bbb-3(b)(1), unless the authorization is terminated or revoked sooner.   Respiratory Panel by RT PCR (Flu A&B, Covid) -  Nasopharyngeal Swab     Status: None   Collection Time: 05/21/19  3:44 PM   Specimen: Nasopharyngeal Swab  Result Value Ref Range   SARS Coronavirus 2 by RT PCR NEGATIVE NEGATIVE    Comment: (NOTE) SARS-CoV-2 target nucleic acids are NOT DETECTED. The SARS-CoV-2 RNA is generally detectable in upper respiratoy specimens during the acute phase of infection. The lowest concentration of SARS-CoV-2 viral copies this assay can detect is 131 copies/mL. A negative result does not preclude SARS-Cov-2 infection and should not be used as the sole basis for treatment or other patient management decisions. A negative result may occur with  improper specimen collection/handling, submission of specimen other than nasopharyngeal swab, presence of viral mutation(s) within the areas targeted by this assay, and inadequate number of viral copies (<131 copies/mL). A negative result must be combined with clinical observations, patient history, and epidemiological information. The expected result is Negative. Fact Sheet for Patients:  PinkCheek.be Fact Sheet for Healthcare Providers:  GravelBags.it This  test is not yet ap proved or cleared by the [place] FDA and  has been authorized for detection and/or diagnosis of SARS-CoV-2 by FDA under an Emergency Use Authorization (EUA). This EUA will remain  in effect (meaning this test can be used) for the duration of the COVID-19 declaration under Section 564(b)(1) of the Act, 21 U.S.C. section 360bbb-3(b)(1), unless the authorization is terminated or revoked sooner.    Influenza A by PCR NEGATIVE NEGATIVE   Influenza B by PCR NEGATIVE NEGATIVE    Comment: (NOTE) The Xpert Xpress SARS-CoV-2/FLU/RSV assay is intended as an aid in  the diagnosis of influenza from Nasopharyngeal swab specimens and  should not be used as a sole basis for treatment. Nasal washings and  aspirates are unacceptable for Xpert Xpress SARS-CoV-2/FLU/RSV  testing. Fact Sheet for Patients: PinkCheek.be Fact Sheet for Healthcare Providers: GravelBags.it This test is not yet approved or cleared by the [place] FDA and  has been authorized for detection and/or diagnosis of SARS-CoV-2 by  FDA under an Emergency Use Authorization (EUA). This EUA will remain  in effect (meaning this test can be used) for the duration of the  Covid-19 declaration under Section 564(b)(1) of the Act, 21  U.S.C. section 360bbb-3(b)(1), unless the authorization is  terminated or revoked. Performed at [facility], [address], [place], [place] [address]   Lactic acid, plasma     Status: None   Collection Time: 05/21/19  3:56 PM  Result Value Ref Range   Lactic Acid, Venous 1.3 0.5 - 1.9 mmol/L    Comment: Performed at [facility], [address], [place], [place] [address]    Chemistries  Recent Labs  Lab 05/21/19 1455  NA 137  K 3.1*  CL 103  CO2 25  GLUCOSE 174*  BUN 20  CREATININE 0.57  CALCIUM 8.1*    ------------------------------------------------------------------------------------------------------------------  ------------------------------------------------------------------------------------------------------------------ GFR: Estimated Creatinine Clearance: 72.1 mL/min (by C-G formula based on SCr of 0.57 mg/dL). Liver Function Tests: No results for input(s): AST, ALT, ALKPHOS, BILITOT, PROT, ALBUMIN in the last 168 hours. No results for input(s): LIPASE, AMYLASE in the last 168 hours. No results for input(s): AMMONIA in the last 168 hours. Coagulation Profile: No results for input(s): INR, PROTIME in the last 168 hours. Cardiac Enzymes: No results for input(s): CKTOTAL, CKMB, CKMBINDEX, TROPONINI in the last 168 hours. BNP (last 3 results) No results for input(s): PROBNP in the last 8760 hours. HbA1C: No results for input(s): HGBA1C in the last 72  hours. CBG: No results for input(s): GLUCAP in the last 168 hours. Lipid Profile: No results for input(s): CHOL, HDL, LDLCALC, TRIG, CHOLHDL, LDLDIRECT in the last 72 hours. Thyroid Function Tests: No results for input(s): TSH, T4TOTAL, FREET4, T3FREE, THYROIDAB in the last 72 hours. Anemia Panel: No results for input(s): VITAMINB12, FOLATE, FERRITIN, TIBC, IRON, RETICCTPCT in the last 72 hours.  --------------------------------------------------------------------------------------------------------------- Urine analysis: No results found for: COLORURINE, APPEARANCEUR, LABSPEC, PHURINE, GLUCOSEU, HGBUR, BILIRUBINUR, KETONESUR, PROTEINUR, UROBILINOGEN, NITRITE, LEUKOCYTESUR    Imaging Results:    CT Angio Chest PE W/Cm &/Or Wo Cm  Result Date: 05/21/2019 CLINICAL DATA:  History of cervical carcinoma with shortness of breath and hypoxia EXAM: CT ANGIOGRAPHY CHEST WITH CONTRAST TECHNIQUE: Multidetector CT imaging of the chest was performed using the standard protocol during bolus administration of intravenous contrast.  Multiplanar CT image reconstructions and MIPs were obtained to evaluate the vascular anatomy. CONTRAST:  169m OMNIPAQUE IOHEXOL 350 MG/ML SOLN COMPARISON:  Chest x-ray from earlier in the same day. FINDINGS: Cardiovascular: Thoracic aorta demonstrates atherosclerotic calcifications without aneurysmal dilatation or dissection. No significant cardiac enlargement is seen. No pericardial effusion is noted. The pulmonary artery shows a normal branching pattern without focal filling defect to suggest pulmonary embolism. Mediastinum/Nodes: Thoracic inlet is within normal limits. No sizable hilar or mediastinal adenopathy is noted. The esophagus as visualized is within normal limits. Lungs/Pleura: Large bilateral pleural effusions are noted right greater than left with associated lower lobe atelectasis. Diffuse edema is identified with patchy ground-glass changes related to the underlying edema given the negative COVID-19 test. Upper Abdomen: Visualized upper abdomen is within normal limits. Musculoskeletal: Degenerative changes of the thoracic spine are seen. No acute rib abnormality is seen. Review of the MIP images confirms the above findings. IMPRESSION: Changes consistent with CHF with edema and large bilateral pleural effusions. Associated atelectatic changes are noted. Aortic Atherosclerosis (ICD10-I70.0). Electronically Signed   By: MInez CatalinaM.D.   On: 05/21/2019 18:30   DG Chest Port 1 View  Result Date: 05/21/2019 CLINICAL DATA:  Shortness of breath and hypoxia, history of cervical carcinoma EXAM: PORTABLE CHEST 1 VIEW COMPARISON:  02/08/19 FINDINGS: Cardiac shadows within normal limits. Left chest wall port is again seen and stable. The lungs are well aerated with central vascular congestion and mild interstitial edema. No focal infiltrate is seen. IMPRESSION: Changes of vascular congestion and interstitial edema. Electronically Signed   By: MInez CatalinaM.D.   On: 05/21/2019 15:49    My personal review  of EKG: Rhythm Sinus tach, Rate 97 /min, QTc 509 ,no Acute ST changes   Assessment & Plan:    Active Problems:   CHF (congestive heart failure) ([place]   1. Acute new onset CHF 1. Likely secondary to cardiotoxic chemotherapy 2. TSH in the AM 3. Serial troponins to r/o ischemic cause 4. Continue aspirin and statin 5. BNP 683 6. CTA and chest x-ray show pulmonary edema 7. 40 mg IV Lasix given one-time 8. Continue 40 mg IV Lasix in the a.m. 9. Echo in the a.m. 10. Insert Foley for I's and O's 11. Daily weights 12. Holding initiation of beta-blocker secondary to bradycardia 13. Holding home lisinopril secondary to low BP 14. Monitor in step down unit 2. Acute hypoxic respiratory failure 1. 2/2 to pulm edema 2. Continue treatment as above 3. Currently on [facility] still dropping to 80s with ambulation 4. Start BiPAP 3. Pleural effusions 1. Thoracentesis ordered for am 2. Fluid analysis ordered 3. Continue to monitor 4. Hypokalemia  1. Replaced with PO 62mq 2. Trend in the A.M on chem panel 5. Bradycardia 1. Monitor on tele 2. Hold beta blocker  3. TSH pending in the A.M. 4. Continue to monitor 6. Anxiety 1. Continue home xanax 2. Continue trintellix 3. Hold trazodone  7. Prolonged QTc 1. Hold one of the psych meds 2. Continue to cut back on psych meds if QTc continues to be prolonged 3. Repeat EKG ordered 4. Continue to monitor 8. Hypertension 1. Hold lisinopril 2. SBP at this time is 942- likely the drop is 2/2 ativan 3. Continue to monitor 9. Hyperlipidemia 1. Continue statin 10. Diabetes mellitus type 2 1. Last hgb A1C was 7.5 2. Hgb A1C in the am 3. Patient takes 76u of insulin at home - continue 60 here and sliding scale coverage 4. Carb modified diet     DVT Prophylaxis-   lovenox - SCDs   AM Labs Ordered, also please review Full Orders  Family Communication: No family at bedside  Code Status:  Full  Admission status: Inpatient: Based on patients  clinical presentation and evaluation of above clinical data, I have made determination that patient meets Inpatient criteria at this time.  Time spent in minutes : 6[place]

## 2019-05-21 NOTE — ED Provider Notes (Signed)
[facility] EMERGENCY DEPARTMENT Provider Note   CSN: [id number] Arrival date & time: 05/21/19  1421     History Chief Complaint  Patient presents with  . Shortness of Breath    Jacqueline Bird is a 69 y.o. 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 Pt states she initially thought she was having a panic attack yesterday however could not catch her breath.  Today she continues to have symptoms of shortness of breath.  She has a mild intermittent dry cough.  She also reports bilateral leg swelling that is somewhat new.  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  She is not on anticoagulation.  Patient last treated for anal cancer 6 weeks ago with radiation treatment.  Not currently on chemotherapy treatment.  No known Covid contacts.  Denies chest pain, fevers, changes in taste or smell, sore throat.  Denies calf pain.  The history is provided by the patient.       Past Medical History:  Diagnosis Date  . 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 Cervical cancer ([place])   . Depression   . Diabetes mellitus without complication ([place])   . History of cervical cancer   . Hypertension     Patient Active Problem List   Diagnosis Date Noted  . Anal cancer ([place]) 02/01/2019  . Mass of anus 01/24/2019    Past Surgical History:  Procedure Laterality Date  . CATARACT EXTRACTION, BILATERAL Right 05/2017  . CERVICAL CONE BIOPSY    . HAND AMPUTATION Left   . PORTACATH PLACEMENT Left 02/08/2019   Procedure: INSERTION PORT-A-CATH;  Surgeon: Virl Cagey, MD;  Location: [facility];  Service: General;  Laterality: Left;  . RECTAL BIOPSY  01/26/2019   Procedure: BIOPSY OF ANAL MASS;  Surgeon: Virl Cagey, MD;  Location: [facility];  Service: General;;  . RECTAL EXAM UNDER ANESTHESIA N/A 01/26/2019   Procedure: RECTAL EXAM UNDER  ANESTHESIA;  Surgeon: Virl Cagey, MD;  Location: [facility];  Service: General;  Laterality: N/A;     OB History   No obstetric history on file.     Family History  Problem Relation Age of Onset  . Cataracts Mother   . Glaucoma Mother   . Heart disease Mother   . Diabetes Father   . Heart attack Father   . Cataracts Sister   . Lung disease Sister   . Cataracts Brother   . Diabetes Brother   . Heart disease Brother   . Macular degeneration Maternal Aunt   . Diabetes Maternal Grandfather   . Heart disease Sister   . Diabetes Sister   . Throat cancer Son   . Breast cancer Niece   . Breast cancer Niece   . Amblyopia Neg Hx   . Blindness Neg Hx   . Retinal detachment Neg Hx   . Strabismus Neg Hx   . Retinitis pigmentosa Neg Hx     Social History   Tobacco Use  . Smoking status: Never Smoker  . Smokeless tobacco: Never Used  Substance Use Topics  . Alcohol use: Not Currently  . Drug use: Never    Home Medications Prior to Admission medications   Medication Sig Start Date End Date Taking? Authorizing Provider  acetaminophen (TYLENOL) 500 MG tablet Take 1,000 mg by mouth every 8 (  eight) hours as needed (pain).    [provider]  ALPRAZolam Duanne Moron) 0.5 MG tablet Take 1 tablet (0.5 mg total) by mouth 2 (two) times daily as needed for anxiety. 05/15/19   Lockamy, Randi L, NP-C  ciprofloxacin (CIPRO) 500 MG tablet Take 500 mg by mouth 2 (two) times daily. 04/28/19   [provider]  docusate sodium (COLACE) 100 MG capsule Take 1 capsule (100 mg total) by mouth 2 (two) times daily. Patient taking differently: Take 100 mg by mouth at bedtime.  01/26/19 01/26/20  Virl Cagey, MD  fluorouracil CALGB 91478 in sodium chloride 0.9 % 150 mL Inject into the vein over 96 hr. Day 1 and Day 28 03/06/19   [provider]  gabapentin (NEURONTIN) 100 MG capsule Take 100 mg by mouth 3 (three) times daily. 01/09/19   [provider]    HYDROcodone-acetaminophen (NORCO) 10-325 MG tablet Take 1 tablet by mouth every 4 (four) hours as needed. 05/10/19   Hayden Pedro, PA-C  INVOKANA 300 MG TABS tablet Take 300 mg by mouth daily. 01/09/19   [provider]  Lidocaine, Anorectal, 5 % GEL Apply 1 application topically 4 (four) times daily as needed. Patient not taking: Reported on 04/18/2019 01/26/19   Virl Cagey, MD  lidocaine-prilocaine (EMLA) cream Apply a small amount to port a cath site and cover with plastic wrap 1 hour prior to chemotherapy Patient not taking: Reported on 04/18/2019 02/27/19   Derek Jack, MD  lisinopril (ZESTRIL) 20 MG tablet Take 20 mg by mouth daily. 12/11/18   [provider]  MITOMYCIN IV Inject into the vein. Day 1 and Day 28 03/06/19   [provider]  nabumetone (RELAFEN) 750 MG tablet Take 750 mg by mouth 2 (two) times daily. 01/09/19   [provider]  prochlorperazine (COMPAZINE) 10 MG tablet Take 1 tablet (10 mg total) by mouth every 6 (six) hours as needed for nausea or vomiting. Patient not taking: Reported on 04/18/2019 02/27/19   Derek Jack, MD  silver sulfADIAZINE (SILVADENE) 1 % cream Apply to affected area daily 05/03/19 05/02/20  Derek Jack, MD  simvastatin (ZOCOR) 40 MG tablet Take 40 mg by mouth daily.  12/12/18   [provider]  traZODone (DESYREL) 50 MG tablet Take 50 mg by mouth at bedtime. 01/09/19   [provider]  TRESIBA FLEXTOUCH 200 UNIT/ML SOPN Inject 76 Units into the skin every morning.  01/22/19   [provider]  TRINTELLIX 10 MG TABS tablet Take 10 mg by mouth daily. 01/09/19   [provider]  TRULICITY [id number] SOPN Inject 0.75 mg into the muscle every Sunday.  01/10/19   [provider]    Allergies    Sulfa antibiotics and Sulfamethoxazole  Review of Systems   Review of Systems  Constitutional: Negative for chills and fever.  Respiratory:  Positive for cough and shortness of breath.   All other systems reviewed and are negative.   Physical Exam Updated Vital Signs BP (!) 123/51 (BP Location: Right Arm)   Pulse 96   Temp 98.6 F (37 C) (Oral)   Resp 20   Ht 5\' 4"  (1.626 m)   Wt 85.3 kg   SpO2 93%   BMI 32.27 kg/m   Physical Exam Vitals and nursing note reviewed.  Constitutional:      Appearance: She is well-developed.  HENT:     Head: Normocephalic and atraumatic.  Eyes:     Conjunctiva/sclera: Conjunctivae normal.  Cardiovascular:     Rate and Rhythm: Normal rate and regular rhythm.  Pulmonary:     Effort: Pulmonary effort is normal.     Breath sounds: Normal breath sounds.     Comments: Patient is tachypneic, O2 saturation low 90s liters nasal cannula. Abdominal:     Palpations: Abdomen is soft.  Musculoskeletal:     Comments: Bilateral lower extremity pitting edema, right is slightly worse than the left.  No calf tenderness or redness.  Skin:    General: Skin is warm.  Neurological:     Mental Status: She is alert.  Psychiatric:        Behavior: Behavior normal.     ED Results / Procedures / Treatments   Labs (all labs ordered are listed, but only abnormal results are displayed) Labs Reviewed  BASIC METABOLIC PANEL - Abnormal; Notable for the following components:      Result Value   Potassium 3.1 (*)    Glucose, Bld 174 (*)    Calcium 8.1 (*)    All other components within normal limits  CBC WITH DIFFERENTIAL/PLATELET - Abnormal; Notable for the following components:   RBC 3.47 (*)    Hemoglobin 10.8 (*)    HCT 35.5 (*)    MCV 102.3 (*)    RDW 17.5 (*)    All other components within normal limits  BRAIN NATRIURETIC PEPTIDE - Abnormal; Notable for the following components:   B Natriuretic Peptide 683.0 (*)    All other components within normal limits  RESPIRATORY PANEL BY RT PCR (FLU A&B, COVID)  LACTIC ACID, PLASMA  LACTIC ACID, PLASMA  POC SARS CORONAVIRUS 2 AG -  ED    EKG EKG  Interpretation  Date/Time:  Sunday May 21 2019 14:50:59 EST Ventricular Rate:  97 PR Interval:    QRS Duration: 76 QT Interval:  400 QTC Calculation: 509 R Axis:   54 Text Interpretation: Sinus tachycardia Multiple ventricular premature complexes Borderline repolarization abnormality Prolonged QT interval Artifact Baseline wander Abnormal ECG Confirmed by Carmin Muskrat [phone]) on 05/21/2019 3:39:18 PM   Radiology DG Chest Port 1 View  Result Date: 05/21/2019 CLINICAL DATA:  Shortness of breath and hypoxia, history of cervical carcinoma EXAM: PORTABLE CHEST 1 VIEW COMPARISON:  02/08/19 FINDINGS: Cardiac shadows within normal limits. Left chest wall port is again seen and stable. The lungs are well aerated with central vascular congestion and mild interstitial edema. No focal infiltrate is seen. IMPRESSION: Changes of vascular congestion and interstitial edema. Electronically Signed   By: Inez Catalina M.D.   On: 05/21/2019 15:49    Procedures Procedures (including critical care time)  Medications Ordered in ED Medications - No data to display  ED Course  I have reviewed the triage vital signs and the nursing notes.  Pertinent labs & imaging results that were available during my care of the patient were reviewed by me and considered in my medical decision making (see chart for details).    MDM Rules/Calculators/A&P                      Patient with history of mom cell carcinoma of the anus with recent radiation therapy 6 weeks ago, as well as remote history of PE back in the 1970s after a surgery, presenting to the ED with shortness of breath since yesterday.  She endorses a mild cough, however no other systemic symptoms.  She does have mild bilateral pitting edema to the lower extremities.  She is  hypoxic on room air at 88%.  She is satting in the low 90s on 6 L nasal cannula with tachypnea and mild increased respiratory effort.  Afebrile with normal heart rate and blood pressure.   Patient does not meet sepsis criteria.  Work-up initiated with labs, chest x-ray, and CTA to evaluate for PE.  POC Covid is negative.  Care assumed at shift change by PA Rona Ravens, pending labs, CT, and admission for hypoxia.  Final Clinical Impression(s) / ED Diagnoses Final diagnoses:  None    Rx / DC Orders ED Discharge Orders    None       Tykerria Mccubbins, [place] N, PA-C 05/21/19 1653    Carmin Muskrat, MD 05/23/19 1037

## 2019-05-21 NOTE — ED Triage Notes (Signed)
Patient is 6 wks past last radiation treatment for cervical cancer. Patient reports he buttocks is raw from the treatment. Oxygen saturations are 88 percent on room air in triage. Patient placed on 2 liters without recovery and increased to 4L [place].

## 2019-05-21 NOTE — ED Notes (Signed)
Pt began insisting she needed to get up to use the bathroom. Advised pt we could either use urinal, bedpan, or purewick. Pt did not want to use any of these methods and insisted to get up. Before I could get assistance, pt took off all of her clothing and monitor and crawled out of the end of the bed naked. Pt crying and screaming that she must get up. Myself, Lana Fish RN, and Mosie Lukes NT assisted pt in getting up to bedside commode. Pt began crying and apologizing, explained to pt why she must remain in bed as her sats dropped to 83% on 6L high flow cannula. Pt assisted back to bed without further event and sats improved to 91%.

## 2019-05-22 ENCOUNTER — Inpatient Hospital Stay (HOSPITAL_COMMUNITY): Payer: Medicare HMO

## 2019-05-22 ENCOUNTER — Encounter (HOSPITAL_COMMUNITY): Payer: Self-pay | Admitting: Family Medicine

## 2019-05-22 DIAGNOSIS — I361 Nonrheumatic tricuspid (valve) insufficiency: Secondary | ICD-10-CM

## 2019-05-22 DIAGNOSIS — I340 Nonrheumatic mitral (valve) insufficiency: Secondary | ICD-10-CM

## 2019-05-22 LAB — COMPREHENSIVE METABOLIC PANEL
ALT: 13 U/L (ref 0–44)
AST: 20 U/L (ref 15–41)
Albumin: 2.4 g/dL — ABNORMAL LOW (ref 3.5–5.0)
Alkaline Phosphatase: 42 U/L (ref 38–126)
Anion gap: 11 (ref 5–15)
BUN: 18 mg/dL (ref 8–23)
CO2: 27 mmol/L (ref 22–32)
Calcium: 8.5 mg/dL — ABNORMAL LOW (ref 8.9–10.3)
Chloride: 104 mmol/L (ref 98–111)
Creatinine, Ser: 0.53 mg/dL (ref 0.44–1.00)
GFR calc Af Amer: >60 mL/min (ref >60)
GFR calc non Af Amer: >60 mL/min (ref >60)
Glucose, Bld: 42 mg/dL — CL (ref 70–99)
Potassium: 2.7 mmol/L — CL (ref 3.5–5.1)
Sodium: 142 mmol/L (ref 135–145)
Total Bilirubin: 0.7 mg/dL (ref 0.3–1.2)
Total Protein: 6.4 g/dL — ABNORMAL LOW (ref 6.5–8.1)

## 2019-05-22 LAB — CBC WITH DIFFERENTIAL/PLATELET
Abs Immature Granulocytes: 0.04 10*3/uL (ref 0.00–0.07)
Basophils Absolute: 0 10*3/uL (ref 0.0–0.1)
Basophils Relative: 1 %
Eosinophils Absolute: 0.1 10*3/uL (ref 0.0–0.5)
Eosinophils Relative: 2 %
HCT: 36.6 % (ref 36.0–46.0)
Hemoglobin: 11 g/dL — ABNORMAL LOW (ref 12.0–15.0)
Immature Granulocytes: 1 %
Lymphocytes Relative: 20 %
Lymphs Abs: 1.3 10*3/uL (ref 0.7–4.0)
MCH: 30.2 pg (ref 26.0–34.0)
MCHC: 30.1 g/dL (ref 30.0–36.0)
MCV: 100.5 fL — ABNORMAL HIGH (ref 80.0–100.0)
Monocytes Absolute: 1 10*3/uL (ref 0.1–1.0)
Monocytes Relative: 16 %
Neutro Abs: 4 10*3/uL (ref 1.7–7.7)
Neutrophils Relative %: 60 %
Platelets: 373 10*3/uL (ref 150–400)
RBC: 3.64 MIL/uL — ABNORMAL LOW (ref 3.87–5.11)
RDW: 17.3 % — ABNORMAL HIGH (ref 11.5–15.5)
WBC: 6.5 10*3/uL (ref 4.0–10.5)
nRBC: 0 % (ref 0.0–0.2)

## 2019-05-22 LAB — HEMOGLOBIN A1C
Hgb A1c MFr Bld: 5.7 % — ABNORMAL HIGH (ref 4.8–5.6)
Mean Plasma Glucose: 116.89 mg/dL

## 2019-05-22 LAB — MAGNESIUM: Magnesium: 2 mg/dL (ref 1.7–2.4)

## 2019-05-22 LAB — CBG MONITORING, ED
Glucose-Capillary: 29 mg/dL — CL (ref 70–99)
Glucose-Capillary: 108 mg/dL — ABNORMAL HIGH (ref 70–99)
Glucose-Capillary: 107 mg/dL — ABNORMAL HIGH (ref 70–99)
Glucose-Capillary: 75 mg/dL (ref 70–99)

## 2019-05-22 LAB — GLUCOSE, CAPILLARY
Glucose-Capillary: 89 mg/dL (ref 70–99)
Glucose-Capillary: 100 mg/dL — ABNORMAL HIGH (ref 70–99)

## 2019-05-22 LAB — GLUCOSE, PLEURAL OR PERITONEAL FLUID: Glucose, Fluid: 99 mg/dL

## 2019-05-22 LAB — BODY FLUID CELL COUNT WITH DIFFERENTIAL
Eos, Fluid: 0 %
Lymphs, Fluid: 75 %
Monocyte-Macrophage-Serous Fluid: 22 % — ABNORMAL LOW (ref 50–90)
Neutrophil Count, Fluid: 3 % (ref 0–25)
Other Cells, Fluid: 1 %
Total Nucleated Cell Count, Fluid: 183 cu mm (ref 0–1000)

## 2019-05-22 LAB — ECHOCARDIOGRAM COMPLETE
Height: 64 in
Weight: 3008 oz

## 2019-05-22 LAB — AMYLASE, PLEURAL OR PERITONEAL FLUID: Amylase, Fluid: 23 U/L

## 2019-05-22 LAB — LACTATE DEHYDROGENASE, PLEURAL OR PERITONEAL FLUID: LD, Fluid: 74 U/L — ABNORMAL HIGH (ref 3–23)

## 2019-05-22 LAB — TSH: TSH: 1.461 u[IU]/mL (ref 0.350–4.500)

## 2019-05-22 LAB — GRAM STAIN: Gram Stain: NONE SEEN

## 2019-05-22 LAB — MRSA PCR SCREENING: MRSA by PCR: NEGATIVE

## 2019-05-22 MED ORDER — DEXTROSE 50 % IV SOLN
INTRAVENOUS | Status: AC
  Administered 2019-05-22: 25 g via INTRAVENOUS
  Filled 2019-05-22: qty 50

## 2019-05-22 MED ORDER — DEXTROSE 50 % IV SOLN: 25.0000 g | Freq: Once | INTRAVENOUS | Status: AC

## 2019-05-22 MED ORDER — POTASSIUM CHLORIDE 20 MEQ PO PACK
40.0000 meq | PACK | ORAL | Status: DC
  Administered 2019-05-22: 40 meq via ORAL
  Filled 2019-05-22: qty 2

## 2019-05-22 MED ORDER — CHLORHEXIDINE GLUCONATE CLOTH 2 % EX PADS
6.0000 | MEDICATED_PAD | Freq: Every day | CUTANEOUS | Status: DC
  Administered 2019-05-22 – 2019-05-27 (×6): 6 via TOPICAL

## 2019-05-22 MED ORDER — INSULIN GLARGINE 100 UNIT/ML ~~LOC~~ SOLN
18.0000 [IU] | Freq: Every day | SUBCUTANEOUS | Status: DC
  Administered 2019-05-23: 18 [IU] via SUBCUTANEOUS
  Filled 2019-05-22 (×3): qty 0.18

## 2019-05-22 MED ORDER — FUROSEMIDE 10 MG/ML IJ SOLN
40.0000 mg | Freq: Two times a day (BID) | INTRAMUSCULAR | Status: DC
  Administered 2019-05-22 – 2019-05-23 (×3): 40 mg via INTRAVENOUS
  Filled 2019-05-22 (×3): qty 4

## 2019-05-22 MED ORDER — POTASSIUM CHLORIDE 10 MEQ/100ML IV SOLN
10.0000 meq | INTRAVENOUS | Status: AC
  Administered 2019-05-22 (×3): 10 meq via INTRAVENOUS
  Filled 2019-05-22 (×2): qty 100

## 2019-05-22 MED ORDER — SILVER SULFADIAZINE 1 % EX CREA
TOPICAL_CREAM | Freq: Two times a day (BID) | CUTANEOUS | Status: DC
  Filled 2019-05-22: qty 50

## 2019-05-22 MED ORDER — POTASSIUM CHLORIDE 10 MEQ/100ML IV SOLN
10.0000 meq | INTRAVENOUS | Status: AC
  Administered 2019-05-22 (×3): 10 meq via INTRAVENOUS
  Filled 2019-05-22 (×4): qty 100

## 2019-05-22 NOTE — Progress Notes (Signed)
Pt placed on BIPAP 10/5 70% due to increased RR and decreased O2 sat.  Pt seems anxious but is toleraing well at this time.  RT will continue to monitor.

## 2019-05-22 NOTE — ED Notes (Signed)
Pt was given meal tray, this NT and NT Dylan Ruotolo tried to reposition so patient could eat with bed rail up as she was at risk for falling out of bed. Pt refused to be repositioned and would not allow NT to assist with meal. Client states she no longer wants to eat at this time. Tray left at bedside in case patient changes her mind. Side rails up and bed in lowest position. Call light w/in reach.

## 2019-05-22 NOTE — Evaluation (Signed)
Physical Therapy Evaluation Patient Details Name: Jacqueline Bird MRN: [id number] DOB: 29-Nov-1950 Today's Date: 05/22/2019   History of Present Illness  Jacqueline Bird  is a 69 y.o. female,with history of gynecological cancer, finished radiation and chemotherapy the week of Nov 20th, presents to the ED with a chief complaint of shortness of breath.  Patient reports that the shortness of breath started last night.  It was gradual in onset and progressively worsening all night.  Patient cannot lie flat and reports that she used 5 pillows to prop her up last night.  This is not normal for her she is usually able to lay flat.  Patient reports an associated shallow dry cough.  She also has an associated wheeze.  She reports that she has had a history of wheeze in the past with history of bronchitis.  Patient denies any recent fever.  Patient admits that she has just finished radiation and chemotherapy in November 20, and that she had 3 fevers since then the last one being last week.  Patient denies any chest pain.  She admits to palpitations with exertion.  When questioned about the shortness of breath being exertional patient seems confused.  She reports that she is not able to ambulate due to extreme fatigue and malaise, so she would not know if the shortness of breath was exertional.  She also goes on to say that the shortness of breath that started last night.  Patient has no sick contacts.  Patient lives alone although her children drop into check on her.  Patient has no history of CHF.  1 of patient's chemotherapy medications was fluorouracil which does have cardiotoxic side effects.    Clinical Impression  Patient functioning near baseline for functional mobility and gait, presents with amputated left hand which is baseline per patient, has roll on stomach then slide off bed for getting up due to c/o severe pain and irritation on buttocks, demonstrates good return for ambulating at bedside without loss of  balance, reduced from 7 LPM to 4 LPM O2 with SpO2 remaining between 92-95%, but once lying in bed SpO2 dropped to 88-89% and increased back to 7 LPM O2 - RN notified.  Patient will benefit from continued physical therapy in hospital and recommended venue below to increase strength, balance, endurance for safe ADLs and gait.     Follow Up Recommendations No PT follow up;Supervision - Intermittent    Equipment Recommendations  None recommended by PT    Recommendations for Other Services       Precautions / Restrictions Precautions Precautions: None Restrictions Weight Bearing Restrictions: No      Mobility  Bed Mobility Overal bed mobility: Modified Independent             General bed mobility comments: has to roll onto stomach to sit up due to irritated skin over buttocks due to Rad & Chemo Rx  Transfers Overall transfer level: Modified independent Equipment used: None                Ambulation/Gait Ambulation/Gait assistance: Modified independent (Device/Increase time);Supervision Gait Distance (Feet): 40 Feet Assistive device: None Gait Pattern/deviations: Decreased step length - right;Decreased step length - left;Decreased stride length Gait velocity: decreased   General Gait Details: slightly labored steps ambulating at bedside while on 7LPM O2, reduced to 4 LPM, SpO2 at 92-94%, but once lying down SpO2 dropped to 88-89%, put back on 7 LPM O2  Stairs  Wheelchair Mobility    Modified Rankin (Stroke Patients Only)       Balance Overall balance assessment: No apparent balance deficits (not formally assessed)                                           Pertinent Vitals/Pain Pain Assessment: Faces Faces Pain Scale: Hurts little more Pain Location: IV insertion RUE Pain Descriptors / Indicators: Discomfort;Sore Pain Intervention(s): Limited activity within patient's tolerance;Monitored during session;Repositioned     Home Living Family/patient expects to be discharged to:: Private residence Living Arrangements: Alone Available Help at Discharge: Family;Available PRN/intermittently Type of Home: House Home Access: Stairs to enter Entrance Stairs-Rails: Right;Left;Can reach both Entrance Stairs-Number of Steps: 2 Home Layout: One level Home Equipment: Environmental consultant - 2 wheels      Prior Function Level of Independence: Independent with assistive device(s)         Comments: Sales executive, 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     Hand Dominance        Extremity/Trunk Assessment   Upper Extremity Assessment Upper Extremity Assessment: Overall WFL for tasks assessed;LUE deficits/detail LUE Deficits / Details: grossly 3+/5, amputated left hand, limited for functional use LUE Sensation: WNL LUE Coordination: WNL    Lower Extremity Assessment Lower Extremity Assessment: Overall WFL for tasks assessed    Cervical / Trunk Assessment Cervical / Trunk Assessment: Normal  Communication   Communication: No difficulties  Cognition Arousal/Alertness: Awake/alert Behavior During Therapy: WFL for tasks assessed/performed Overall Cognitive Status: Within Functional Limits for tasks assessed                                        General Comments      Exercises     Assessment/Plan    PT Assessment Patient needs continued PT services  PT Problem List Decreased strength;Decreased activity tolerance;Decreased balance;Decreased mobility       PT Treatment Interventions Gait training;Stair training;Functional mobility training;Therapeutic activities;Therapeutic exercise;Balance training;Patient/family education    PT Goals (Current goals can be found in the Care Plan section)  Acute Rehab PT Goals Patient Stated Goal: return home with family to assist PT Goal Formulation: With patient Time For Goal Achievement: 05/25/19 Potential to Achieve Goals: Good    Frequency Min  3X/week   Barriers to discharge        Co-evaluation               AM-PAC PT "6 Clicks" Mobility  Outcome Measure Help needed turning from your back to your side while in a flat bed without using bedrails?: None Help needed moving from lying on your back to sitting on the side of a flat bed without using bedrails?: A Little Help needed moving to and from a bed to a chair (including a wheelchair)?: None Help needed standing up from a chair using your arms (e.g., wheelchair or bedside chair)?: None Help needed to walk in hospital room?: A Little Help needed climbing 3-5 steps with a railing? : A Little 6 Click Score: 21    End of Session Equipment Utilized During Treatment: Oxygen Activity Tolerance: Patient tolerated treatment well;Patient limited by fatigue Patient left: in bed;with call bell/phone within reach Nurse Communication: Mobility status PT Visit Diagnosis: Unsteadiness on feet (R26.81);Other abnormalities of gait and mobility (R26.89);Muscle weakness (generalized) (  M62.81)    [id number] PT Time Calculation (min) (ACUTE ONLY): 29 min   Charges:   PT Evaluation $PT Eval Moderate Complexity: 1 Mod PT Treatments $Therapeutic Activity: 23-37 mins        12:41 PM, 05/22/19 Lonell Grandchild, MPT Physical Therapist with [facility] [phone] office [phone] mobile phone

## 2019-05-22 NOTE — Progress Notes (Signed)
Thoracentesis complete no signs of distress.  

## 2019-05-22 NOTE — Plan of Care (Signed)
  Problem: Acute Rehab PT Goals(only PT should resolve) Goal: Pt Will Go Supine/Side To Sit Outcome: Progressing Flowsheets (Taken 05/22/2019 1242) Pt will go Supine/Side to Sit: with modified independence Goal: Patient Will Transfer Sit To/From Stand Outcome: Progressing Flowsheets (Taken 05/22/2019 1242) Patient will transfer sit to/from stand: Independently Goal: Pt Will Transfer Bed To Chair/Chair To Bed Outcome: Progressing Flowsheets (Taken 05/22/2019 1242) Pt will Transfer Bed to Chair/Chair to Bed: Independently Goal: Pt Will Ambulate Outcome: Progressing Flowsheets (Taken 05/22/2019 1242) Pt will Ambulate:  > 125 feet  with modified independence  Independently   12:42 PM, 05/22/19 Lonell Grandchild, MPT Physical Therapist with [facility] 336 [phone] office [phone] mobile phone

## 2019-05-22 NOTE — Progress Notes (Signed)
*  PRELIMINARY RESULTS* Echocardiogram 2D Echocardiogram has been performed.  Jacqueline Bird 05/22/2019, 10:41 AM

## 2019-05-22 NOTE — Procedures (Signed)
PreOperative Dx: RT pleural effusion Postoperative Dx: RT pleural effusion Procedure:   US guided RT thoracentesis Radiologist:  Thornton Papas Anesthesia:  10 ml of 1% lidocaine Specimen:  530 mL of yellow colored fluid EBL:   < 1 ml Complications: None

## 2019-05-22 NOTE — Progress Notes (Signed)
Pt crying in room and can  Not state why. O2 has been turned up to 10L [facility] and patient O2 sats 86%. RT paged for pt to have bipap- Tylene Fantasia NP paged to see if something for anxiety can be given to patient d/t her xanax not being due until MN. Waiting for orders/call back. Will continue to monitor pt.

## 2019-05-22 NOTE — Progress Notes (Signed)
PROGRESS NOTE    Jacqueline Bird  [id number] DOB: 03-07-1951 DOA: 05/21/2019 PCP: Lucia Gaskins, MD   Brief Narrative:  Per HPI: Jacqueline Bird  is a 69 y.o. female,with history of gynecological cancer, finished radiation and chemotherapy the week of Nov 20th, presents to the ED with a chief complaint of shortness of breath.  Patient reports that the shortness of breath started last night.  It was gradual in onset and progressively worsening all night.  Patient cannot lie flat and reports that she used 5 pillows to prop her up last night.  This is not normal for her she is usually able to lay flat.  Patient reports an associated shallow dry cough.  She also has an associated wheeze.  She reports that she has had a history of wheeze in the past with history of bronchitis.  Patient denies any recent fever.  Patient admits that she has just finished radiation and chemotherapy in November 20, and that she had 3 fevers since then the last one being last week.  Patient denies any chest pain.  She admits to palpitations with exertion.  When questioned about the shortness of breath being exertional patient seems confused.  She reports that she is not able to ambulate due to extreme fatigue and malaise, so she would not know if the shortness of breath was exertional.  She also goes on to say that the shortness of breath that started last night.  Patient has no sick contacts.  Patient lives alone although her children drop into check on her.  Patient has no history of CHF.  1 of patient's chemotherapy medications was fluorouracil which does have cardiotoxic side effects.  Patient received Ativan in the ER.  She reports that she does have a history of anxiety.  She reports that Dr. Raliegh Ip has given her medication for anxiety at home with, but she has not been taking it.  Med rec reveals that she is on Trintellix, trazodone, and Xanax.  1/4: Patient has been admitted with acute hypoxemic respiratory failure  secondary to new-onset acute CHF likely due to cardiotoxic chemotherapy. She has undergone right sided thoracentesis of approximately 520mL with fluid sample pending. She is also hypokalemic with potassium supplementation underway. 2D echo with grade 1 diastolic dysfunction and LVEF 50-55%.  Assessment & Plan:   Active Problems:   CHF (congestive heart failure) (HCC)   Acute hypoxemic respiratory failure secondary to new onset acute diastolic CHF decompensation with associated pleural effusions -Continue Lasix 40mg  IV BID -S/P R sided thoracentesis today with fluid analysis pending -2D echo with LVEF 50-55% and grade 1 diastolic dysfunction  Severe hypokalemia -Repleting orally and IV -Recheck in am along with Mg -Monitor on tele  Bradycardia-improving -TSH 1.461 -Resume B blocker in am if stable and improved  Anxiety -Continue xanax and trintellix  Prolonged QTC -QTc 410ms on EKG -Monitor on tele while hypokalemic -Holding trazodone  Hypertension-soft -continue to hold lisinopril -Resume in am if improved  Dyslipidemia -statin  DM2 -A1c 5.7% -Reduce home long acting insulin to 18u while here and monitor  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 Delton Coombes outpatient   DVT prophylaxis:Lovenox Code Status: Full Family Communication: None at bedside Disposition Plan: Continue careful diuresis and analyze fluid from thoracentesis. Monitor potassium.   Consultants:   None  Procedures:   R sided thoracentesis 1/4  Antimicrobials:  Anti-infectives (From admission, onward)   None  Subjective: Patient seen and evaluated today with no new acute complaints or concerns. No acute concerns or events noted overnight. She is wanting Silvadene cream for perianal skin breakdown.  Objective: Vitals:   05/22/19 1415 05/22/19 1428 05/22/19 1521 05/22/19 1600  BP:  (!)  123/33  (!) 95/39  Pulse:   93 90  Resp: 20 (!) 21 18 20   Temp:  98.4 F (36.9 C)    TempSrc:  Oral    SpO2:  94%  93%  Weight:      Height:        Intake/Output Summary (Last 24 hours) at 05/22/2019 1715 Last data filed at 05/22/2019 1531 Gross per 24 hour  Intake 3033.18 ml  Output --  Net 3033.18 ml   Filed Weights   05/21/19 1437  Weight: 85.3 kg    Examination:  General exam: Appears calm and comfortable  Respiratory system: Clear to auscultation. Respiratory effort normal. On 6L [place]. Cardiovascular system: S1 & S2 heard, RRR. No JVD, murmurs, rubs, gallops or clicks. No pedal edema. Gastrointestinal system: Abdomen is nondistended, soft and nontender. No organomegaly or masses felt. Normal bowel sounds heard. Central nervous system: Alert and oriented. No focal neurological deficits. Extremities: Symmetric 5 x 5 power. Skin: No rashes, lesions or ulcers; perianal skin breakdown noted; no erythema or drainage Psychiatry: Judgement and insight appear normal. Mood & affect appropriate.     Data Reviewed: I have personally reviewed following labs and imaging studies  CBC: Recent Labs  Lab 05/21/19 1455 05/22/19 0425  WBC 6.3 6.5  NEUTROABS 4.3 4.0  HGB 10.8* 11.0*  HCT 35.5* 36.6  MCV 102.3* 100.5*  PLT 377 [id number]   Basic Metabolic Panel: Recent Labs  Lab 05/21/19 1455 05/21/19 2004 05/22/19 0425  NA 137  --  142  K 3.1*  --  2.7*  CL 103  --  104  CO2 25  --  27  GLUCOSE 174*  --  42*  BUN 20  --  18  CREATININE 0.57  --  0.53  CALCIUM 8.1*  --  8.5*  MG  --  2.0 2.0   GFR: Estimated Creatinine Clearance: 72.1 mL/min (by C-G formula based on SCr of 0.53 mg/dL). Liver Function Tests: Recent Labs  Lab 05/22/19 0425  AST 20  ALT 13  ALKPHOS 42  BILITOT 0.7  PROT 6.4*  ALBUMIN 2.4*   No results for input(s): LIPASE, AMYLASE in the last 168 hours. No results for input(s): AMMONIA in the last 168 hours. Coagulation Profile: No results for  input(s): INR, PROTIME in the last 168 hours. Cardiac Enzymes: No results for input(s): CKTOTAL, CKMB, CKMBINDEX, TROPONINI in the last 168 hours. BNP (last 3 results) No results for input(s): PROBNP in the last 8760 hours. HbA1C: Recent Labs    05/22/19 0424  HGBA1C 5.7*   CBG: Recent Labs  Lab 05/22/19 0603 05/22/19 0635 05/22/19 0755 05/22/19 1157 05/22/19 1702  GLUCAP 29* 108* 107* 75 89   Lipid Profile: No results for input(s): CHOL, HDL, LDLCALC, TRIG, CHOLHDL, LDLDIRECT in the last 72 hours. Thyroid Function Tests: Recent Labs    05/22/19 0425  TSH 1.461   Anemia Panel: No results for input(s): VITAMINB12, FOLATE, FERRITIN, TIBC, IRON, RETICCTPCT in the last 72 hours. Sepsis Labs: Recent Labs  Lab 05/21/19 1556  LATICACIDVEN 1.3    Recent Results (from the past 240 hour(s))  Respiratory Panel by RT PCR (Flu A&B, Covid) - Nasopharyngeal Swab     Status: None  Collection Time: 05/21/19  3:44 PM   Specimen: Nasopharyngeal Swab  Result Value Ref Range Status   SARS Coronavirus 2 by RT PCR NEGATIVE NEGATIVE Final    Comment: (NOTE) SARS-CoV-2 target nucleic acids are NOT DETECTED. The SARS-CoV-2 RNA is generally detectable in upper respiratoy specimens during the acute phase of infection. The lowest concentration of SARS-CoV-2 viral copies this assay can detect is 131 copies/mL. A negative result does not preclude SARS-Cov-2 infection and should not be used as the sole basis for treatment or other patient management decisions. A negative result may occur with  improper specimen collection/handling, submission of specimen other than nasopharyngeal swab, presence of viral mutation(s) within the areas targeted by this assay, and inadequate number of viral copies (<131 copies/mL). A negative result must be combined with clinical observations, patient history, and epidemiological information. The expected result is Negative. Fact Sheet for Patients:    PinkCheek.be Fact Sheet for Healthcare Providers:  GravelBags.it This test is not yet ap proved or cleared by the [place] FDA and  has been authorized for detection and/or diagnosis of SARS-CoV-2 by FDA under an Emergency Use Authorization (EUA). This EUA will remain  in effect (meaning this test can be used) for the duration of the COVID-19 declaration under Section 564(b)(1) of the Act, 21 U.S.C. section 360bbb-3(b)(1), unless the authorization is terminated or revoked sooner.    Influenza A by PCR NEGATIVE NEGATIVE Final   Influenza B by PCR NEGATIVE NEGATIVE Final    Comment: (NOTE) The Xpert Xpress SARS-CoV-2/FLU/RSV assay is intended as an aid in  the diagnosis of influenza from Nasopharyngeal swab specimens and  should not be used as a sole basis for treatment. Nasal washings and  aspirates are unacceptable for Xpert Xpress SARS-CoV-2/FLU/RSV  testing. Fact Sheet for Patients: PinkCheek.be Fact Sheet for Healthcare Providers: GravelBags.it This test is not yet approved or cleared by the [place] FDA and  has been authorized for detection and/or diagnosis of SARS-CoV-2 by  FDA under an Emergency Use Authorization (EUA). This EUA will remain  in effect (meaning this test can be used) for the duration of the  Covid-19 declaration under Section 564(b)(1) of the Act, 21  U.S.C. section 360bbb-3(b)(1), unless the authorization is  terminated or revoked. Performed at [facility], [address] [place], [place] [address]   Gram stain     Status: None   Collection Time: 05/22/19  9:13 AM   Specimen: Pleura; Body Fluid  Result Value Ref Range Status   Specimen Description PLEURAL  Final   Special Requests NONE  Final   Gram Stain   Final    NO ORGANISMS SEEN WBC PRESENT, PREDOMINANTLY MONONUCLEAR CYTOSPIN SMEAR Performed at [facility], [address] [place],  [address]    Report Status 05/22/2019 FINAL  Final         Radiology Studies: DG Chest 1 View  Result Date: 05/22/2019 CLINICAL DATA:  BILATERAL pleural effusions, post RIGHT thoracentesis, history of cervical cancer, diabetes mellitus, hypertension EXAM: CHEST  1 VIEW COMPARISON:  05/21/2019 FINDINGS: LEFT subclavian Port-A-Cath with tip projecting over SVC. Enlargement of cardiac silhouette with pulmonary vascular congestion. Crowding of markings and probable mild perihilar edema though accentuated by expiratory technique, question mild failure. Persistent LEFT basilar atelectasis versus consolidation. Small LEFT pleural effusion. No pneumothorax post RIGHT thoracentesis. Bones demineralized. IMPRESSION: No pneumothorax following RIGHT thoracentesis. Enlargement of cardiac silhouette with vascular congestion and question mild pulmonary edema/failure. Electronically Signed   By: Elta Guadeloupe  Thornton Papas M.D.   On: 05/22/2019 09:35   CT Angio Chest PE W/Cm &/Or Wo Cm  Result Date: 05/21/2019 CLINICAL DATA:  History of cervical carcinoma with shortness of breath and hypoxia EXAM: CT ANGIOGRAPHY CHEST WITH CONTRAST TECHNIQUE: Multidetector CT imaging of the chest was performed using the standard protocol during bolus administration of intravenous contrast. Multiplanar CT image reconstructions and MIPs were obtained to evaluate the vascular anatomy. CONTRAST:  176mL OMNIPAQUE IOHEXOL 350 MG/ML SOLN COMPARISON:  Chest x-ray from earlier in the same day. FINDINGS: Cardiovascular: Thoracic aorta demonstrates atherosclerotic calcifications without aneurysmal dilatation or dissection. No significant cardiac enlargement is seen. No pericardial effusion is noted. The pulmonary artery shows a normal branching pattern without focal filling defect to suggest pulmonary embolism. Mediastinum/Nodes: Thoracic inlet is within normal limits. No sizable hilar or mediastinal adenopathy is noted. The esophagus as  visualized is within normal limits. Lungs/Pleura: Large bilateral pleural effusions are noted right greater than left with associated lower lobe atelectasis. Diffuse edema is identified with patchy ground-glass changes related to the underlying edema given the negative COVID-19 test. Upper Abdomen: Visualized upper abdomen is within normal limits. Musculoskeletal: Degenerative changes of the thoracic spine are seen. No acute rib abnormality is seen. Review of the MIP images confirms the above findings. IMPRESSION: Changes consistent with CHF with edema and large bilateral pleural effusions. Associated atelectatic changes are noted. Aortic Atherosclerosis (ICD10-I70.0). Electronically Signed   By: Inez Catalina M.D.   On: 05/21/2019 18:30   DG Chest Port 1 View  Result Date: 05/21/2019 CLINICAL DATA:  Shortness of breath and hypoxia, history of cervical carcinoma EXAM: PORTABLE CHEST 1 VIEW COMPARISON:  02/08/19 FINDINGS: Cardiac shadows within normal limits. Left chest wall port is again seen and stable. The lungs are well aerated with central vascular congestion and mild interstitial edema. No focal infiltrate is seen. IMPRESSION: Changes of vascular congestion and interstitial edema. Electronically Signed   By: Inez Catalina M.D.   On: 05/21/2019 15:49   ECHOCARDIOGRAM COMPLETE  Result Date: 05/22/2019   ECHOCARDIOGRAM REPORT   Patient Name:   KENLI WINNING Date of Exam: 05/22/2019 Medical Rec #:  [id number]       Height:       64.0 in Accession #:    [id number]      Weight:       188.0 lb Date of Birth:  July 16, 1950       BSA:          1.91 m Patient Age:    18 years        BP:           107/89 mmHg Patient Gender: F               HR:           85 bpm. Exam Location:  [facility] Procedure: 2D Echo, Cardiac Doppler and Color Doppler Indications:    CHF-Acute Diastolic [id number] / [id number]  History:        Patient has no prior history of Echocardiogram examinations.                 CHF; Risk Factors:Diabetes and  Hypertension. Cancer,.  Sonographer:    Alvino Chapel RCS Referring Phys: [id number] ASIA B [place]  1. Left ventricular ejection fraction, by visual estimation, is 50 to 55%. The left ventricle has low normal function. There is no left ventricular hypertrophy.  2. Basal inferolateral segment and basal inferior segment are abnormal.  3.  Left ventricular diastolic parameters are consistent with Grade I diastolic dysfunction (impaired relaxation).  4. The left ventricle demonstrates regional wall motion abnormalities.  5. Global right ventricle has normal systolic function.The right ventricular size is normal. No increase in right ventricular wall thickness.  6. Left atrial size was moderately dilated.  7. Right atrial size was normal.  8. The mitral valve is grossly normal. Mild mitral valve regurgitation.  9. The tricuspid valve is grossly normal. 10. The aortic valve is tricuspid. Aortic valve regurgitation is not visualized. 11. The pulmonic valve was grossly normal. Pulmonic valve regurgitation is trivial. 12. Mildly elevated pulmonary artery systolic pressure. 13. The tricuspid regurgitant velocity is 2.30 m/s, and with an assumed right atrial pressure of 15 mmHg, the estimated right ventricular systolic pressure is mildly elevated at 36.2 mmHg. 14. The inferior vena cava is dilated in size with <50% respiratory variability, suggesting right atrial pressure of 15 mmHg. FINDINGS  Left Ventricle: Left ventricular ejection fraction, by visual estimation, is 50 to 55%. The left ventricle has low normal function. The left ventricle demonstrates regional wall motion abnormalities. The left ventricular internal cavity size was the left ventricle is normal in size. There is no left ventricular hypertrophy. Left ventricular diastolic parameters are consistent with Grade I diastolic dysfunction (impaired relaxation).  LV Wall Scoring: The basal inferolateral segment and basal inferior segment are akinetic.  All remaining scored segments are normal. Right Ventricle: The right ventricular size is normal. No increase in right ventricular wall thickness. Global RV systolic function is has normal systolic function. The tricuspid regurgitant velocity is 2.30 m/s, and with an assumed right atrial pressure  of 15 mmHg, the estimated right ventricular systolic pressure is mildly elevated at 36.2 mmHg. Left Atrium: Left atrial size was moderately dilated. Right Atrium: Right atrial size was normal in size Pericardium: There is no evidence of pericardial effusion. Mitral Valve: The mitral valve is grossly normal. Mild mitral valve regurgitation. Tricuspid Valve: The tricuspid valve is grossly normal. Tricuspid valve regurgitation is mild. Aortic Valve: The aortic valve is tricuspid. Aortic valve regurgitation is not visualized. Mild aortic valve annular calcification. Pulmonic Valve: The pulmonic valve was grossly normal. Pulmonic valve regurgitation is trivial. Pulmonic regurgitation is trivial. Aorta: The aortic root is normal in size and structure. Venous: The inferior vena cava is dilated in size with less than 50% respiratory variability, suggesting right atrial pressure of 15 mmHg. IAS/Shunts: No atrial level shunt detected by color flow Doppler.  LEFT VENTRICLE PLAX 2D LVIDd:         5.34 cm       Diastology LVIDs:         3.94 cm       LV e' lateral:   7.18 cm/s LV PW:         0.94 cm       LV E/e' lateral: 13.4 LV IVS:        0.86 cm       LV e' medial:    6.74 cm/s LVOT diam:     2.00 cm       LV E/e' medial:  14.2 LV SV:         70 ml LV SV Index:   35.19 LVOT Area:     3.14 cm  LV Volumes (MOD) LV area d, A2C:    31.30 cm LV area d, A4C:    30.90 cm LV area s, A2C:    19.40 cm LV area s, A4C:  20.50 cm LV major d, A2C:   7.61 cm LV major d, A4C:   8.04 cm LV major s, A2C:   6.11 cm LV major s, A4C:   6.95 cm LV vol d, MOD A2C: 108.0 ml LV vol d, MOD A4C: 97.4 ml LV vol s, MOD A2C: 53.9 ml LV vol s, MOD A4C:  52.3 ml LV SV MOD A2C:     54.1 ml LV SV MOD A4C:     97.4 ml LV SV MOD BP:      48.8 ml RIGHT VENTRICLE RV S prime:     13.80 cm/s TAPSE (M-mode): 1.8 cm LEFT ATRIUM             Index       RIGHT ATRIUM           Index LA diam:        4.10 cm 2.15 cm/m  RA Area:     16.60 cm LA Vol (A2C):   85.8 ml 45.03 ml/m RA Volume:   48.70 ml  25.56 ml/m LA Vol (A4C):   77.2 ml 40.51 ml/m LA Biplane Vol: 88.7 ml 46.55 ml/m  AORTIC VALVE LVOT Vmax:   100.37 cm/s LVOT Vmean:  71.633 cm/s LVOT VTI:    0.228 m  AORTA Ao Root diam: 3.00 cm MITRAL VALVE                         TRICUSPID VALVE MV Area (PHT): 4.60 cm              TR Peak grad:   21.2 mmHg MV PHT:        47.85 msec            TR Vmax:        230.00 cm/s MV Decel Time: 165 msec MV E velocity: 96.00 cm/s  103 cm/s  SHUNTS MV A velocity: 104.00 cm/s 70.3 cm/s Systemic VTI:  0.23 m MV E/A ratio:  0.92        1.5       Systemic Diam: 2.00 cm  Rozann Lesches MD Electronically signed by Rozann Lesches MD Signature Date/Time: 05/22/2019/2:36:48 PM    Final    US THORACENTESIS ASP PLEURAL SPACE W/IMG GUIDE  Result Date: 05/22/2019 INDICATION: BILATERAL pleural effusions, history hypertension, diabetes mellitus, cervical cancer post radiation therapy EXAM: ULTRASOUND GUIDED DIAGNOSTIC AND THERAPEUTIC RIGHT THORACENTESIS MEDICATIONS: None COMPLICATIONS: None immediate PROCEDURE: Procedure, benefits, and risks of procedure were discussed with patient. Written informed consent for procedure was obtained. Time out protocol followed. Pleural effusion localized by ultrasound at the posterior RIGHT hemithorax. Skin prepped and draped in usual sterile fashion. Skin and soft tissues anesthetized with 10 mL of 1% lidocaine. 8 French thoracentesis catheter placed into the RIGHT pleural space. 530 mL of yellow RIGHT pleural fluid aspirated by syringe pump. Procedure tolerated well by patient without immediate complication. FINDINGS: A total of approximately 530 mL of RIGHT pleural  fluid was removed. Samples were sent to the laboratory as requested by the clinical team. IMPRESSION: Successful ultrasound guided RIGHT thoracentesis yielding 530 mL of pleural fluid. Electronically Signed   By: Lavonia Dana M.D.   On: 05/22/2019 09:47        Scheduled Meds: . aspirin EC  81 mg Oral Daily  . Chlorhexidine Gluconate Cloth  6 each Topical Daily  . docusate sodium  100 mg Oral QHS  . enoxaparin (LOVENOX) injection  40 mg Subcutaneous Q24H  .  furosemide  40 mg Intravenous Q12H  . gabapentin  100 mg Oral TID  . insulin aspart  0-15 Units Subcutaneous TID WC  . insulin glargine  18 Units Subcutaneous QHS  . potassium chloride  40 mEq Oral Daily  . silver sulfADIAZINE   Topical BID  . simvastatin  40 mg Oral Daily  . sodium chloride flush  3 mL Intravenous Q12H  . vortioxetine HBr  10 mg Oral Daily   Continuous Infusions: . sodium chloride    . potassium chloride 10 mEq (05/22/19 1653)     LOS: 1 day    Time spent: 30 minutes    Danique Hartsough Darleen Crocker, DO [facility] Pager [phone]  If 7PM-7AM, please contact night-coverage [email or web address] Password [email or web address] 05/22/2019, 5:15 PM

## 2019-05-22 NOTE — Progress Notes (Addendum)
Inpatient Diabetes Program Recommendations  AACE/ADA: New Consensus Statement on Inpatient Glycemic Control (2015)  Target Ranges:  Prepandial:   less than 140 mg/dL      Peak postprandial:   less than 180 mg/dL (1-2 hours)      Critically ill patients:  140 - 180 mg/dL   Lab Results  Component Value Date   GLUCAP 107 (H) 05/22/2019   HGBA1C 5.7 (H) 05/22/2019    Review of Glycemic Control Results for Jacqueline Bird, Jacqueline Bird (MRN [id number]) as of 05/22/2019 10:44  Ref. Range 05/22/2019 06:03 05/22/2019 06:35 05/22/2019 07:55  Glucose-Capillary Latest Ref Range: 70 - 99 mg/dL 29 (LL) 108 (H) 107 (H)   Diabetes history: Type 2 Dm Outpatient Diabetes medications: Tresiba 76 units QAM, Invokana [id number] mg QD, Trulicity A999333 mg Qsunday Current orders for Inpatient glycemic control: Lantus 60 units QHS, Novolog 0-15 units TID  Inpatient Diabetes Program Recommendations:    Noted hypoglycemia of 29 mg/dL this Am following Lantus QHS dose. Consider decreasing Lantus to 18 units QHS.  Thanks, Bronson Curb, MSN, RNC-OB Diabetes Coordinator [phone] (8a-5p)

## 2019-05-22 NOTE — Progress Notes (Signed)
Pt placed on bipap per RT @ 70%.

## 2019-05-22 NOTE — ED Notes (Signed)
Pt taken to US

## 2019-05-23 DIAGNOSIS — I5031 Acute diastolic (congestive) heart failure: Secondary | ICD-10-CM | POA: Diagnosis present

## 2019-05-23 DIAGNOSIS — I10 Essential (primary) hypertension: Secondary | ICD-10-CM

## 2019-05-23 DIAGNOSIS — E1169 Type 2 diabetes mellitus with other specified complication: Secondary | ICD-10-CM

## 2019-05-23 DIAGNOSIS — J9601 Acute respiratory failure with hypoxia: Secondary | ICD-10-CM

## 2019-05-23 DIAGNOSIS — E876 Hypokalemia: Secondary | ICD-10-CM

## 2019-05-23 DIAGNOSIS — C210 Malignant neoplasm of anus, unspecified: Secondary | ICD-10-CM

## 2019-05-23 DIAGNOSIS — Z794 Long term (current) use of insulin: Secondary | ICD-10-CM

## 2019-05-23 DIAGNOSIS — F419 Anxiety disorder, unspecified: Secondary | ICD-10-CM

## 2019-05-23 LAB — BASIC METABOLIC PANEL
Anion gap: 5 (ref 5–15)
BUN: 13 mg/dL (ref 8–23)
CO2: 30 mmol/L (ref 22–32)
Calcium: 8 mg/dL — ABNORMAL LOW (ref 8.9–10.3)
Chloride: 104 mmol/L (ref 98–111)
Creatinine, Ser: 0.46 mg/dL (ref 0.44–1.00)
GFR calc Af Amer: >60 mL/min (ref >60)
GFR calc non Af Amer: >60 mL/min (ref >60)
Glucose, Bld: 84 mg/dL (ref 70–99)
Potassium: 3.6 mmol/L (ref 3.5–5.1)
Sodium: 139 mmol/L (ref 135–145)

## 2019-05-23 LAB — CBC
HCT: 32.8 % — ABNORMAL LOW (ref 36.0–46.0)
Hemoglobin: 9.9 g/dL — ABNORMAL LOW (ref 12.0–15.0)
MCH: 30.6 pg (ref 26.0–34.0)
MCHC: 30.2 g/dL (ref 30.0–36.0)
MCV: 101.2 fL — ABNORMAL HIGH (ref 80.0–100.0)
Platelets: 280 10*3/uL (ref 150–400)
RBC: 3.24 MIL/uL — ABNORMAL LOW (ref 3.87–5.11)
RDW: 16.8 % — ABNORMAL HIGH (ref 11.5–15.5)
WBC: 4.4 10*3/uL (ref 4.0–10.5)
nRBC: 0 % (ref 0.0–0.2)

## 2019-05-23 LAB — GLUCOSE, CAPILLARY
Glucose-Capillary: 80 mg/dL (ref 70–99)
Glucose-Capillary: 126 mg/dL — ABNORMAL HIGH (ref 70–99)
Glucose-Capillary: 161 mg/dL — ABNORMAL HIGH (ref 70–99)
Glucose-Capillary: 129 mg/dL — ABNORMAL HIGH (ref 70–99)

## 2019-05-23 LAB — CYTOLOGY - NON PAP

## 2019-05-23 LAB — MAGNESIUM: Magnesium: 1.8 mg/dL (ref 1.7–2.4)

## 2019-05-23 MED ORDER — LORAZEPAM 2 MG/ML IJ SOLN
1.0000 mg | Freq: Four times a day (QID) | INTRAMUSCULAR | Status: DC | PRN
  Administered 2019-05-23 – 2019-05-27 (×7): 1 mg via INTRAVENOUS
  Filled 2019-05-23 (×7): qty 1

## 2019-05-23 MED ORDER — FUROSEMIDE 10 MG/ML IJ SOLN
20.0000 mg | Freq: Two times a day (BID) | INTRAMUSCULAR | Status: DC
  Administered 2019-05-23 – 2019-05-27 (×7): 20 mg via INTRAVENOUS
  Filled 2019-05-23 (×7): qty 2

## 2019-05-23 NOTE — Progress Notes (Signed)
Physical Therapy Treatment Patient Details Name: Jacqueline Bird MRN: [id number] DOB: Sep 23, 1950 Today's Date: 05/23/2019    History of Present Illness Jacqueline Bird  is a 69 y.o. female,with history of gynecological cancer, finished radiation and chemotherapy the week of Nov 20th, presents to the ED with a chief complaint of shortness of breath.  Patient reports that the shortness of breath started last night.  It was gradual in onset and progressively worsening all night.  Patient cannot lie flat and reports that she used 5 pillows to prop her up last night.  This is not normal for her she is usually able to lay flat.  Patient reports an associated shallow dry cough.  She also has an associated wheeze.  She reports that she has had a history of wheeze in the past with history of bronchitis.  Patient denies any recent fever.  Patient admits that she has just finished radiation and chemotherapy in November 20, and that she had 3 fevers since then the last one being last week.  Patient denies any chest pain.  She admits to palpitations with exertion.  When questioned about the shortness of breath being exertional patient seems confused.  She reports that she is not able to ambulate due to extreme fatigue and malaise, so she would not know if the shortness of breath was exertional.  She also goes on to say that the shortness of breath that started last night.  Patient has no sick contacts.  Patient lives alone although her children drop into check on her.  Patient has no history of CHF.  1 of patient's chemotherapy medications was fluorouracil which does have cardiotoxic side effects.    PT Comments    Patient demonstrates fair/good return for rolling onto stomach and completing sit to stand pushing self from bedside without loss of balance, tolerated taking steps at bedside for approximately 6-7 minutes before fatiguing, unable to tolerated sitting up in chair with pillow on seat or sitting up at bedside  due to c/o severe pain over buttocks.  Patient went back to bed and tolerated lying in side lying position with head of bed raised approximately 30 degrees.  Patient on 12 LPM high flow O2 with SpO2 between 92-96% while ambulating in room - RN notified.  Patient will benefit from continued physical therapy in hospital and recommended venue below to increase strength, balance, endurance for safe ADLs and gait.   Follow Up Recommendations  No PT follow up;Supervision - Intermittent     Equipment Recommendations  None recommended by PT    Recommendations for Other Services       Precautions / Restrictions Precautions Precautions: Fall Restrictions Weight Bearing Restrictions: No    Mobility  Bed Mobility Overal bed mobility: Modified Independent             General bed mobility comments: has to roll onto stomach to sit up due to irritated skin over buttocks due to Rad & Chemo Rx  Transfers Overall transfer level: Modified independent Equipment used: None                Ambulation/Gait Ambulation/Gait assistance: Modified independent (Device/Increase time);Supervision Gait Distance (Feet): 50 Feet   Gait Pattern/deviations: Decreased step length - right;Decreased step length - left;Decreased stride length Gait velocity: decreased   General Gait Details: slightly increased endurance/distance for taking steps at bedside, limited mostly due to managing vital signs connections with occasional stumbling without loss of balance   Stairs  Wheelchair Mobility    Modified Rankin (Stroke Patients Only)       Balance Overall balance assessment: No apparent balance deficits (not formally assessed)                                          Cognition Arousal/Alertness: Awake/alert Behavior During Therapy: WFL for tasks assessed/performed Overall Cognitive Status: Within Functional Limits for tasks assessed                                         Exercises      General Comments        Pertinent Vitals/Pain Pain Assessment: Faces Faces Pain Scale: Hurts whole lot Pain Location: over buttocks when attempting to sit or lie on back Pain Descriptors / Indicators: Guarding;Grimacing;Sore;Sharp Pain Intervention(s): Limited activity within patient's tolerance;Monitored during session;Repositioned    Home Living                      Prior Function            PT Goals (current goals can now be found in the care plan section) Acute Rehab PT Goals Patient Stated Goal: return home with family to assist PT Goal Formulation: With patient Time For Goal Achievement: 05/30/19 Potential to Achieve Goals: Good Progress towards PT goals: Progressing toward goals    Frequency    Min 3X/week      PT Plan      Co-evaluation              AM-PAC PT "6 Clicks" Mobility   Outcome Measure  Help needed turning from your back to your side while in a flat bed without using bedrails?: None Help needed moving from lying on your back to sitting on the side of a flat bed without using bedrails?: A Little Help needed moving to and from a bed to a chair (including a wheelchair)?: None Help needed standing up from a chair using your arms (e.g., wheelchair or bedside chair)?: None Help needed to walk in hospital room?: A Little Help needed climbing 3-5 steps with a railing? : A Little 6 Click Score: 21    End of Session Equipment Utilized During Treatment: Oxygen Activity Tolerance: Patient tolerated treatment well;Patient limited by fatigue Patient left: in bed;with call bell/phone within reach;with family/visitor present Nurse Communication: Mobility status PT Visit Diagnosis: Unsteadiness on feet (R26.81);Other abnormalities of gait and mobility (R26.89);Muscle weakness (generalized) (M62.81)     Time: [id number] PT Time Calculation (min) (ACUTE ONLY): 24 min  Charges:  $Gait Training:  8-22 mins $Therapeutic Activity: 8-22 mins                     11:21 AM, 05/23/19 Lonell Grandchild, MPT Physical Therapist with [facility] [phone] office [phone] mobile phone

## 2019-05-23 NOTE — Progress Notes (Signed)
Pt not tolerating bipap well- adv pt that her O2 sats were in low 80s and oxygen was turned all the way up to 12L HFNC- Dr. Scherrie November paged to see if pt could receive something for anxiety. Waiting for call back/orders. Will continue to monitor pt.

## 2019-05-23 NOTE — Progress Notes (Signed)
Pt comfortable off Bipap and on 7L HFNC at this time.  Rt will continue to monitor.

## 2019-05-23 NOTE — Progress Notes (Addendum)
PROGRESS NOTE    Jacqueline Bird  [id number] DOB: December 08, 1950 DOA: 05/21/2019 PCP: Lucia Gaskins, MD   Brief Narrative:  Per HPI: Jacqueline Bird  is a 69 y.o. female,with history of gynecological cancer, finished radiation and chemotherapy the week of Nov 20th, presents to the ED with a chief complaint of shortness of breath.  Patient reports that the shortness of breath started last night.  It was gradual in onset and progressively worsening all night.  Patient cannot lie flat and reports that she used 5 pillows to prop her up last night.  This is not normal for her she is usually able to lay flat.  Patient reports an associated shallow dry cough.  She also has an associated wheeze.  She reports that she has had a history of wheeze in the past with history of bronchitis.  Patient denies any recent fever.  Patient admits that she has just finished radiation and chemotherapy in November 20, and that she had 3 fevers since then the last one being last week.  Patient denies any chest pain.  She admits to palpitations with exertion.  When questioned about the shortness of breath being exertional patient seems confused.  She reports that she is not able to ambulate due to extreme fatigue and malaise, so she would not know if the shortness of breath was exertional.  She also goes on to say that the shortness of breath that started last night.  Patient has no sick contacts.  Patient lives alone although her children drop into check on her.  Patient has no history of CHF.  1 of patient's chemotherapy medications was fluorouracil which does have cardiotoxic side effects.  Patient received Ativan in the ER.  She reports that she does have a history of anxiety.  She reports that Dr. Raliegh Ip has given her medication for anxiety at home with, but she has not been taking it.  Med rec reveals that she is on Trintellix, trazodone, and Xanax.   Assessment & Plan:   Active Problems:   Anal cancer (HCC)   CHF  (congestive heart failure) (HCC)   Acute diastolic CHF (congestive heart failure) (HCC)   Acute respiratory failure with hypoxia (HCC)   Hypokalemia   Essential hypertension   Type 2 diabetes mellitus with other specified complication (HCC)   Anxiety   Acute hypoxemic respiratory failure secondary to new onset acute diastolic CHF decompensation with associated pleural effusions -Required BiPAP on 1/4, but now on high flow nasal cannula. -Continue to wean down oxygen as tolerated. -Continue Lasix 20mg  IV BID -Patient has high volume urine output with diuretics. -S/P R sided thoracentesis 1/4 with fluid analysis indicated transudate of process -2D echo with LVEF 50-55% and grade 1 diastolic dysfunction -Discussed with Dr. Delton Coombes who did not feel that chemotherapy was causing heart failure. -We will need follow-up with cardiology regarding further management.  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 Delton Coombes outpatient   DVT prophylaxis:Lovenox Code Status: Full Family Communication: None at  bedside Disposition Plan: Continue careful diuresis and analyze fluid from thoracentesis. Monitor potassium.   Consultants:   None  Procedures:   R sided thoracentesis 1/4  Antimicrobials:  Anti-infectives (From admission, onward)   None      Subjective: Patient became more short of breath overnight.  Was hypoxic and required  BiPAP mask.  She has been off of BiPAP since this morning and is on high flow nasal cannula 12 L.  She still feels short of breath.  Feels generally weak.  Objective: Vitals:   05/23/19 1600 05/23/19 1615 05/23/19 1620 05/23/19 1725  BP: (!) 99/48     Pulse: 95 92 94 94  Resp: (!) 27 20 20 17   Temp:      TempSrc:      SpO2: 91% 91% 94% 94%  Weight:      Height:        Intake/Output Summary (Last 24 hours) at 05/23/2019 1844 Last data filed at 05/23/2019 1700 Gross per 24 hour  Intake 890 ml  Output 3600 ml  Net -2710 ml   Filed Weights   05/21/19 1437 05/23/19 1143  Weight: 85.3 kg 92.2 kg    Examination:  General exam: Appears calm and comfortable  Respiratory system: diminished breath sounds at bases. Mild increase in Resp effort. On 12L [place]. Cardiovascular system: S1 & S2 heard, RRR. No JVD, murmurs, rubs, gallops or clicks. Trace-1+ pedal edema. Gastrointestinal system: Abdomen is nondistended, soft and nontender. No organomegaly or masses felt. Normal bowel sounds heard. Central nervous system: Alert and oriented. No focal neurological deficits. Extremities: Symmetric 5 x 5 power. Skin: No rashes, lesions or ulcers; perianal skin breakdown noted; no erythema or drainage Psychiatry: Judgement and insight appear normal. Mood & affect appropriate.     Data Reviewed: I have personally reviewed following labs and imaging studies  CBC: Recent Labs  Lab 05/21/19 1455 05/22/19 0425 05/23/19 0755  WBC 6.3 6.5 4.4  NEUTROABS 4.3 4.0  --   HGB 10.8* 11.0* 9.9*  HCT 35.5* 36.6 32.8*  MCV 102.3* 100.5* 101.2*  PLT 377 373 [id number]   Basic Metabolic Panel: Recent Labs  Lab 05/21/19 1455 05/21/19 2004 05/22/19 0425 05/23/19 0755  NA 137  --  142 139  K 3.1*  --  2.7* 3.6  CL 103  --  104 104  CO2 25  --  27 30  GLUCOSE 174*  --  42* 84  BUN 20  --  18 13  CREATININE 0.57  --  0.53 0.46  CALCIUM 8.1*  --  8.5* 8.0*  MG  --  2.0 2.0 1.8   GFR: Estimated Creatinine  Clearance: 75.1 mL/min (by C-G formula based on SCr of 0.46 mg/dL). Liver Function Tests: Recent Labs  Lab 05/22/19 0425  AST 20  ALT 13  ALKPHOS 42  BILITOT 0.7  PROT 6.4*  ALBUMIN 2.4*   No results for input(s): LIPASE, AMYLASE in the last 168 hours. No results for input(s): AMMONIA in the last 168 hours. Coagulation Profile: No results for input(s): INR, PROTIME in the last 168 hours. Cardiac Enzymes: No results for input(s): CKTOTAL, CKMB, CKMBINDEX, TROPONINI in the last 168 hours. BNP (last 3 results) No results for input(s): PROBNP in the last 8760 hours. HbA1C: Recent Labs    05/22/19 0424  HGBA1C 5.7*   CBG: Recent Labs  Lab 05/22/19 1702 05/22/19 2116 05/23/19 0803 05/23/19 1203 05/23/19 1701  GLUCAP 89 100* 80 126* 161*   Lipid Profile: No results  for input(s): CHOL, HDL, LDLCALC, TRIG, CHOLHDL, LDLDIRECT in the last 72 hours. Thyroid Function Tests: Recent Labs    05/22/19 0425  TSH 1.461   Anemia Panel: No results for input(s): VITAMINB12, FOLATE, FERRITIN, TIBC, IRON, RETICCTPCT in the last 72 hours. Sepsis Labs: Recent Labs  Lab 05/21/19 1556  LATICACIDVEN 1.3    Recent Results (from the past 240 hour(s))  Respiratory Panel by RT PCR (Flu A&B, Covid) - Nasopharyngeal Swab     Status: None   Collection Time: 05/21/19  3:44 PM   Specimen: Nasopharyngeal Swab  Result Value Ref Range Status   SARS Coronavirus 2 by RT PCR NEGATIVE NEGATIVE Final    Comment: (NOTE) SARS-CoV-2 target nucleic acids are NOT DETECTED. The SARS-CoV-2 RNA is generally detectable in upper respiratoy specimens during the acute phase of infection. The lowest concentration of SARS-CoV-2 viral copies this assay can detect is 131 copies/mL. A negative result does not preclude SARS-Cov-2 infection and should not be used as the sole basis for treatment or other patient management decisions. A negative result may occur with  improper specimen collection/handling,  submission of specimen other than nasopharyngeal swab, presence of viral mutation(s) within the areas targeted by this assay, and inadequate number of viral copies (<131 copies/mL). A negative result must be combined with clinical observations, patient history, and epidemiological information. The expected result is Negative. Fact Sheet for Patients:  PinkCheek.be Fact Sheet for Healthcare Providers:  GravelBags.it This test is not yet ap proved or cleared by the [place] FDA and  has been authorized for detection and/or diagnosis of SARS-CoV-2 by FDA under an Emergency Use Authorization (EUA). This EUA will remain  in effect (meaning this test can be used) for the duration of the COVID-19 declaration under Section 564(b)(1) of the Act, 21 U.S.C. section 360bbb-3(b)(1), unless the authorization is terminated or revoked sooner.    Influenza A by PCR NEGATIVE NEGATIVE Final   Influenza B by PCR NEGATIVE NEGATIVE Final    Comment: (NOTE) The Xpert Xpress SARS-CoV-2/FLU/RSV assay is intended as an aid in  the diagnosis of influenza from Nasopharyngeal swab specimens and  should not be used as a sole basis for treatment. Nasal washings and  aspirates are unacceptable for Xpert Xpress SARS-CoV-2/FLU/RSV  testing. Fact Sheet for Patients: PinkCheek.be Fact Sheet for Healthcare Providers: GravelBags.it This test is not yet approved or cleared by the [place] FDA and  has been authorized for detection and/or diagnosis of SARS-CoV-2 by  FDA under an Emergency Use Authorization (EUA). This EUA will remain  in effect (meaning this test can be used) for the duration of the  Covid-19 declaration under Section 564(b)(1) of the Act, 21  U.S.C. section 360bbb-3(b)(1), unless the authorization is  terminated or revoked. Performed at [facility], [address],  [place], [place] [address]   Culture, body fluid-bottle     Status: None (Preliminary result)   Collection Time: 05/22/19  9:12 AM   Specimen: Pleura  Result Value Ref Range Status   Specimen Description PLEURAL  Final   Special Requests BOTTLES DRAWN AEROBIC AND ANAEROBIC 10CC  Final   Culture   Final    NO GROWTH < 24 HOURS Performed at [facility], [address] [place], [place] [address]    Report Status PENDING  Incomplete  Gram stain     Status: None   Collection Time: 05/22/19  9:13 AM   Specimen: Pleura; Body Fluid  Result Value Ref Range Status   Specimen  Description PLEURAL  Final   Special Requests NONE  Final   Gram Stain   Final    NO ORGANISMS SEEN WBC PRESENT, PREDOMINANTLY MONONUCLEAR CYTOSPIN SMEAR Performed at [facility], [address] [place], [place] [address]    Report Status 05/22/2019 FINAL  Final  MRSA PCR Screening     Status: None   Collection Time: 05/22/19  2:55 PM   Specimen: Nasal Mucosa; Nasopharyngeal  Result Value Ref Range Status   MRSA by PCR NEGATIVE NEGATIVE Final    Comment:        The GeneXpert MRSA Assay (FDA approved for NASAL specimens only), is one component of a comprehensive MRSA colonization surveillance program. It is not intended to diagnose MRSA infection nor to guide or monitor treatment for MRSA infections. Performed at [facility], [address], [place], [place] [address]          Radiology Studies: DG Chest 1 View  Result Date: 05/22/2019 CLINICAL DATA:  BILATERAL pleural effusions, post RIGHT thoracentesis, history of cervical cancer, diabetes mellitus, hypertension EXAM: CHEST  1 VIEW COMPARISON:  05/21/2019 FINDINGS: LEFT subclavian Port-A-Cath with tip projecting over SVC. Enlargement of cardiac silhouette with pulmonary vascular congestion. Crowding of markings and probable mild perihilar edema though accentuated by expiratory technique, question mild failure. Persistent LEFT basilar atelectasis versus  consolidation. Small LEFT pleural effusion. No pneumothorax post RIGHT thoracentesis. Bones demineralized. IMPRESSION: No pneumothorax following RIGHT thoracentesis. Enlargement of cardiac silhouette with vascular congestion and question mild pulmonary edema/failure. Electronically Signed   By: Lavonia Dana M.D.   On: 05/22/2019 09:35   ECHOCARDIOGRAM COMPLETE  Result Date: 05/22/2019   ECHOCARDIOGRAM REPORT   Patient Name:   CANDA SLAPPEY Date of Exam: 05/22/2019 Medical Rec #:  [id number]       Height:       64.0 in Accession #:    [id number]      Weight:       188.0 lb Date of Birth:  [birth year]       BSA:          1.91 m Patient Age:    66 years        BP:           107/89 mmHg Patient Gender: F               HR:           85 bpm. Exam Location:  [facility] Procedure: 2D Echo, Cardiac Doppler and Color Doppler Indications:    CHF-Acute Diastolic [id number] / [id number]  History:        Patient has no prior history of Echocardiogram examinations.                 CHF; Risk Factors:Diabetes and Hypertension. Cancer,.  Sonographer:    Alvino Chapel RCS Referring Phys: [id number] ASIA B [place]  1. Left ventricular ejection fraction, by visual estimation, is 50 to 55%. The left ventricle has low normal function. There is no left ventricular hypertrophy.  2. Basal inferolateral segment and basal inferior segment are abnormal.  3. Left ventricular diastolic parameters are consistent with Grade I diastolic dysfunction (impaired relaxation).  4. The left ventricle demonstrates regional wall motion abnormalities.  5. Global right ventricle has normal systolic function.The right ventricular size is normal. No increase in right ventricular wall thickness.  6. Left atrial size was moderately dilated.  7. Right atrial size was normal.  8. The mitral valve is grossly normal. Mild  mitral valve regurgitation.  9. The tricuspid valve is grossly normal. 10. The aortic valve is tricuspid. Aortic valve regurgitation is not  visualized. 11. The pulmonic valve was grossly normal. Pulmonic valve regurgitation is trivial. 12. Mildly elevated pulmonary artery systolic pressure. 13. The tricuspid regurgitant velocity is 2.30 m/s, and with an assumed right atrial pressure of 15 mmHg, the estimated right ventricular systolic pressure is mildly elevated at 36.2 mmHg. 14. The inferior vena cava is dilated in size with <50% respiratory variability, suggesting right atrial pressure of 15 mmHg. FINDINGS  Left Ventricle: Left ventricular ejection fraction, by visual estimation, is 50 to 55%. The left ventricle has low normal function. The left ventricle demonstrates regional wall motion abnormalities. The left ventricular internal cavity size was the left ventricle is normal in size. There is no left ventricular hypertrophy. Left ventricular diastolic parameters are consistent with Grade I diastolic dysfunction (impaired relaxation).  LV Wall Scoring: The basal inferolateral segment and basal inferior segment are akinetic. All remaining scored segments are normal. Right Ventricle: The right ventricular size is normal. No increase in right ventricular wall thickness. Global RV systolic function is has normal systolic function. The tricuspid regurgitant velocity is 2.30 m/s, and with an assumed right atrial pressure  of 15 mmHg, the estimated right ventricular systolic pressure is mildly elevated at 36.2 mmHg. Left Atrium: Left atrial size was moderately dilated. Right Atrium: Right atrial size was normal in size Pericardium: There is no evidence of pericardial effusion. Mitral Valve: The mitral valve is grossly normal. Mild mitral valve regurgitation. Tricuspid Valve: The tricuspid valve is grossly normal. Tricuspid valve regurgitation is mild. Aortic Valve: The aortic valve is tricuspid. Aortic valve regurgitation is not visualized. Mild aortic valve annular calcification. Pulmonic Valve: The pulmonic valve was grossly normal. Pulmonic valve  regurgitation is trivial. Pulmonic regurgitation is trivial. Aorta: The aortic root is normal in size and structure. Venous: The inferior vena cava is dilated in size with less than 50% respiratory variability, suggesting right atrial pressure of 15 mmHg. IAS/Shunts: No atrial level shunt detected by color flow Doppler.  LEFT VENTRICLE PLAX 2D LVIDd:         5.34 cm       Diastology LVIDs:         3.94 cm       LV e' lateral:   7.18 cm/s LV PW:         0.94 cm       LV E/e' lateral: 13.4 LV IVS:        0.86 cm       LV e' medial:    6.74 cm/s LVOT diam:     2.00 cm       LV E/e' medial:  14.2 LV SV:         70 ml LV SV Index:   35.19 LVOT Area:     3.14 cm  LV Volumes (MOD) LV area d, A2C:    31.30 cm LV area d, A4C:    30.90 cm LV area s, A2C:    19.40 cm LV area s, A4C:    20.50 cm LV major d, A2C:   7.61 cm LV major d, A4C:   8.04 cm LV major s, A2C:   6.11 cm LV major s, A4C:   6.95 cm LV vol d, MOD A2C: 108.0 ml LV vol d, MOD A4C: 97.4 ml LV vol s, MOD A2C: 53.9 ml LV vol s, MOD A4C: 52.3 ml LV SV MOD  A2C:     54.1 ml LV SV MOD A4C:     97.4 ml LV SV MOD BP:      48.8 ml RIGHT VENTRICLE RV S prime:     13.80 cm/s TAPSE (M-mode): 1.8 cm LEFT ATRIUM             Index       RIGHT ATRIUM           Index LA diam:        4.10 cm 2.15 cm/m  RA Area:     16.60 cm LA Vol (A2C):   85.8 ml 45.03 ml/m RA Volume:   48.70 ml  25.56 ml/m LA Vol (A4C):   77.2 ml 40.51 ml/m LA Biplane Vol: 88.7 ml 46.55 ml/m  AORTIC VALVE LVOT Vmax:   100.37 cm/s LVOT Vmean:  71.633 cm/s LVOT VTI:    0.228 m  AORTA Ao Root diam: 3.00 cm MITRAL VALVE                         TRICUSPID VALVE MV Area (PHT): 4.60 cm              TR Peak grad:   21.2 mmHg MV PHT:        47.85 msec            TR Vmax:        230.00 cm/s MV Decel Time: 165 msec MV E velocity: 96.00 cm/s  103 cm/s  SHUNTS MV A velocity: 104.00 cm/s 70.3 cm/s Systemic VTI:  0.23 m MV E/A ratio:  0.92        1.5       Systemic Diam: 2.00 cm  Rozann Lesches MD Electronically  signed by Rozann Lesches MD Signature Date/Time: 05/22/2019/2:36:48 PM    Final    US THORACENTESIS ASP PLEURAL SPACE W/IMG GUIDE  Result Date: 05/22/2019 INDICATION: BILATERAL pleural effusions, history hypertension, diabetes mellitus, cervical cancer post radiation therapy EXAM: ULTRASOUND GUIDED DIAGNOSTIC AND THERAPEUTIC RIGHT THORACENTESIS MEDICATIONS: None COMPLICATIONS: None immediate PROCEDURE: Procedure, benefits, and risks of procedure were discussed with patient. Written informed consent for procedure was obtained. Time out protocol followed. Pleural effusion localized by ultrasound at the posterior RIGHT hemithorax. Skin prepped and draped in usual sterile fashion. Skin and soft tissues anesthetized with 10 mL of 1% lidocaine. 8 French thoracentesis catheter placed into the RIGHT pleural space. 530 mL of yellow RIGHT pleural fluid aspirated by syringe pump. Procedure tolerated well by patient without immediate complication. FINDINGS: A total of approximately 530 mL of RIGHT pleural fluid was removed. Samples were sent to the laboratory as requested by the clinical team. IMPRESSION: Successful ultrasound guided RIGHT thoracentesis yielding 530 mL of pleural fluid. Electronically Signed   By: Lavonia Dana M.D.   On: 05/22/2019 09:47        Scheduled Meds: . aspirin EC  81 mg Oral Daily  . Chlorhexidine Gluconate Cloth  6 each Topical Daily  . docusate sodium  100 mg Oral QHS  . enoxaparin (LOVENOX) injection  40 mg Subcutaneous Q24H  . furosemide  20 mg Intravenous Q12H  . gabapentin  100 mg Oral TID  . insulin aspart  0-15 Units Subcutaneous TID WC  . insulin glargine  18 Units Subcutaneous QHS  . potassium chloride  40 mEq Oral Daily  . silver sulfADIAZINE   Topical BID  . simvastatin  40 mg Oral Daily  . sodium chloride flush  3  mL Intravenous Q12H  . vortioxetine HBr  10 mg Oral Daily   Continuous Infusions: . sodium chloride       LOS: 2 days    Time spent: 30 minutes     Kathie Dike, MD [facility]   If 7PM-7AM, please contact night-coverage [email or web address]  05/23/2019, 6:44 PM

## 2019-05-24 ENCOUNTER — Ambulatory Visit (HOSPITAL_COMMUNITY): Payer: Medicare HMO | Admitting: Hematology

## 2019-05-24 LAB — GLUCOSE, CAPILLARY
Glucose-Capillary: 83 mg/dL (ref 70–99)
Glucose-Capillary: 98 mg/dL (ref 70–99)
Glucose-Capillary: 118 mg/dL — ABNORMAL HIGH (ref 70–99)
Glucose-Capillary: 147 mg/dL — ABNORMAL HIGH (ref 70–99)

## 2019-05-24 LAB — BASIC METABOLIC PANEL
Anion gap: 9 (ref 5–15)
BUN: 15 mg/dL (ref 8–23)
CO2: 29 mmol/L (ref 22–32)
Calcium: 8.2 mg/dL — ABNORMAL LOW (ref 8.9–10.3)
Chloride: 99 mmol/L (ref 98–111)
Creatinine, Ser: 0.56 mg/dL (ref 0.44–1.00)
GFR calc Af Amer: >60 mL/min (ref >60)
GFR calc non Af Amer: >60 mL/min (ref >60)
Glucose, Bld: 96 mg/dL (ref 70–99)
Potassium: 3.5 mmol/L (ref 3.5–5.1)
Sodium: 137 mmol/L (ref 135–145)

## 2019-05-24 MED ORDER — INSULIN GLARGINE 100 UNIT/ML ~~LOC~~ SOLN
13.0000 [IU] | Freq: Every day | SUBCUTANEOUS | Status: DC
  Administered 2019-05-24 – 2019-05-26 (×3): 13 [IU] via SUBCUTANEOUS
  Filled 2019-05-24 (×5): qty 0.13

## 2019-05-24 MED ORDER — POTASSIUM CHLORIDE CRYS ER 20 MEQ PO TBCR
40.0000 meq | EXTENDED_RELEASE_TABLET | Freq: Once | ORAL | Status: AC
  Administered 2019-05-24: 40 meq via ORAL
  Filled 2019-05-24: qty 2

## 2019-05-24 NOTE — Progress Notes (Signed)
Pt placed on BIPAP due to decreased O2 sats in the 80's on 10l HFNC.  Pt states that it was too much for her last night and today.  Pressures turned down.  Pt tolerating well at this time.  RT will continue to monitor.

## 2019-05-24 NOTE — Care Management Important Message (Signed)
Important Message  Patient Details  Name: Jacqueline Bird MRN: [id number] Date of Birth: Oct 13, 1950   Medicare Important Message Given:  Yes     Tommy Medal 05/24/2019, 4:24 PM

## 2019-05-24 NOTE — Progress Notes (Signed)
PROGRESS NOTE    Jacqueline Bird  [id number] DOB: [birth year] DOA: 05/21/2019 PCP: Lucia Gaskins, MD   Brief Narrative:  Per HPI: Jacqueline Bird  is a 69 y.o. female,with history of gynecological cancer, finished radiation and chemotherapy the week of Nov 20th, presents to the ED with a chief complaint of shortness of breath.  Patient reports that the shortness of breath started last night.  It was gradual in onset and progressively worsening all night.  Patient cannot lie flat and reports that she used 5 pillows to prop her up last night.  This is not normal for her she is usually able to lay flat.  Patient reports an associated shallow dry cough.  She also has an associated wheeze.  She reports that she has had a history of wheeze in the past with history of bronchitis.  Patient denies any recent fever.  Patient admits that she has just finished radiation and chemotherapy in November 20, and that she had 3 fevers since then the last one being last week.  Patient denies any chest pain.  She admits to palpitations with exertion.  When questioned about the shortness of breath being exertional patient seems confused.  She reports that she is not able to ambulate due to extreme fatigue and malaise, so she would not know if the shortness of breath was exertional.  She also goes on to say that the shortness of breath that started last night.  Patient has no sick contacts.  Patient lives alone although her children drop into check on her.  Patient has no history of CHF.  1 of patient's chemotherapy medications was fluorouracil which does have cardiotoxic side effects.  Patient received Ativan in the ER.  She reports that she does have a history of anxiety.  She reports that Dr. Raliegh Ip has given her medication for anxiety at home with, but she has not been taking it.  Med rec reveals that she is on Trintellix, trazodone, and Xanax.   Assessment & Plan:   Active Problems:   Anal cancer (HCC)   CHF  (congestive heart failure) (HCC)   Acute diastolic CHF (congestive heart failure) (HCC)   Acute respiratory failure with hypoxia (HCC)   Hypokalemia   Essential hypertension   Type 2 diabetes mellitus with other specified complication (HCC)   Anxiety   Acute hypoxemic respiratory failure secondary to new onset acute diastolic CHF decompensation with associated pleural effusions -Required BiPAP on 1/4, but now on high flow nasal cannula. -Continue to wean down oxygen as tolerated. -Continue Lasix 20mg  IV BID -Patient has high volume urine output with diuretics. -S/P R sided thoracentesis 1/4 with fluid analysis indicated transudate of process -2D echo with LVEF 50-55% and grade 1 diastolic dysfunction -Discussed with Dr. Delton Coombes who did not feel that chemotherapy was causing heart failure. -Will need follow-up with cardiology regarding further management. -Repeat chest x-ray in a.m. to reevaluate left-sided pleural effusion and need for thoracentesis.  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 Delton Coombes outpatient  DVT prophylaxis:Lovenox Code Status: Full Family Communication: Discussed with daughter over the phone Disposition Plan: Continue intravenous diuresis.  Repeat chest x-ray in a.m. to see if thoracentesis is needed on the left side.   Consultants:   None  Procedures:   R sided thoracentesis  1/4  Antimicrobials:  Anti-infectives (From admission, onward)   None      Subjective: Was doing well yesterday, but overnight became more short of breath briefly requiring BiPAP.  She is back on high flow nasal cannula this morning.  Overall feels that shortness of breath is mildly better than yesterday, but still short of breath.  Objective: Vitals:   05/24/19 1200 05/24/19 1216 05/24/19 1530 05/24/19 1603  BP: (!) 111/59     Pulse: 92  94 91  Resp: (!) 21  16 (!) 29  Temp:    98.3 F (36.8 C)  TempSrc:    Oral  SpO2: 94% 92% 91% (!) 87%  Weight:  90.2 kg    Height:        Intake/Output Summary (Last 24 hours) at 05/24/2019 1728 Last data filed at 05/24/2019 1200 Gross per 24 hour  Intake 240 ml  Output 2350 ml  Net -2110 ml   Filed Weights   05/23/19 1143 05/24/19 0500 05/24/19 1216  Weight: 92.2 kg 91.8 kg 90.2 kg    Examination:  General exam: Alert, awake, oriented x 3 Respiratory system: Crackles at bases. Respiratory effort normal. Cardiovascular system:RRR. No murmurs, rubs, gallops. Gastrointestinal system: Abdomen is nondistended, soft and nontender. No organomegaly or masses felt. Normal bowel sounds heard. Central nervous system: Alert and oriented. No focal neurological deficits. Extremities: Left hand amputation, trace pedal edema bilaterally Skin: No rashes, lesions or ulcers Psychiatry: Judgement and insight appear normal. Mood & affect appropriate.    Data Reviewed: I have personally reviewed following labs and imaging studies  CBC: Recent Labs  Lab 05/21/19 1455 05/22/19 0425 05/23/19 0755  WBC 6.3 6.5 4.4  NEUTROABS 4.3 4.0  --   HGB 10.8* 11.0* 9.9*  HCT 35.5* 36.6 32.8*  MCV 102.3* 100.5* 101.2*  PLT 377 373 [id number]   Basic Metabolic Panel: Recent Labs  Lab 05/21/19 1455 05/21/19 2004 05/22/19 0425 05/23/19 0755 05/24/19 0414  NA 137  --  142 139 137  K 3.1*  --  2.7* 3.6 3.5  CL 103  --  104 104 99  CO2 25  --  27 30 29    GLUCOSE 174*  --  42* 84 96  BUN 20  --  18 13 15   CREATININE 0.57  --  0.53 0.46 0.56  CALCIUM 8.1*  --  8.5* 8.0* 8.2*  MG  --  2.0 2.0 1.8  --    GFR: Estimated Creatinine Clearance: 74.2 mL/min (by C-G formula based on SCr of 0.56 mg/dL). Liver Function Tests: Recent Labs  Lab 05/22/19 0425  AST 20  ALT 13  ALKPHOS 42  BILITOT 0.7  PROT 6.4*  ALBUMIN 2.4*   No results for input(s): LIPASE, AMYLASE in the last 168 hours. No results for input(s): AMMONIA in the last 168 hours. Coagulation Profile: No results for input(s): INR, PROTIME in the last 168 hours. Cardiac Enzymes: No results for input(s): CKTOTAL, CKMB, CKMBINDEX, TROPONINI in the last 168 hours. BNP (last 3 results) No results for input(s): PROBNP in the last 8760 hours. HbA1C: Recent Labs    05/22/19 0424  HGBA1C 5.7*   CBG: Recent Labs  Lab 05/23/19 1701 05/23/19 2141 05/24/19 0755  05/24/19 1114 05/24/19 1602  GLUCAP 161* 129* 83 98 118*   Lipid Profile: No results for input(s): CHOL, HDL, LDLCALC, TRIG, CHOLHDL, LDLDIRECT in the last 72 hours. Thyroid Function Tests: Recent Labs    05/22/19 0425  TSH 1.461   Anemia Panel: No results for input(s): VITAMINB12, FOLATE, FERRITIN, TIBC, IRON, RETICCTPCT in the last 72 hours. Sepsis Labs: Recent Labs  Lab 05/21/19 1556  LATICACIDVEN 1.3    Recent Results (from the past 240 hour(s))  Respiratory Panel by RT PCR (Flu A&B, Covid) - Nasopharyngeal Swab     Status: None   Collection Time: 05/21/19  3:44 PM   Specimen: Nasopharyngeal Swab  Result Value Ref Range Status   SARS Coronavirus 2 by RT PCR NEGATIVE NEGATIVE Final    Comment: (NOTE) SARS-CoV-2 target nucleic acids are NOT DETECTED. The SARS-CoV-2 RNA is generally detectable in upper respiratoy specimens during the acute phase of infection. The lowest concentration of SARS-CoV-2 viral copies this assay can detect is 131 copies/mL. A negative result does not preclude  SARS-Cov-2 infection and should not be used as the sole basis for treatment or other patient management decisions. A negative result may occur with  improper specimen collection/handling, submission of specimen other than nasopharyngeal swab, presence of viral mutation(s) within the areas targeted by this assay, and inadequate number of viral copies (<131 copies/mL). A negative result must be combined with clinical observations, patient history, and epidemiological information. The expected result is Negative. Fact Sheet for Patients:  PinkCheek.be Fact Sheet for Healthcare Providers:  GravelBags.it This test is not yet ap proved or cleared by the [place] FDA and  has been authorized for detection and/or diagnosis of SARS-CoV-2 by FDA under an Emergency Use Authorization (EUA). This EUA will remain  in effect (meaning this test can be used) for the duration of the COVID-19 declaration under Section 564(b)(1) of the Act, 21 U.S.C. section 360bbb-3(b)(1), unless the authorization is terminated or revoked sooner.    Influenza A by PCR NEGATIVE NEGATIVE Final   Influenza B by PCR NEGATIVE NEGATIVE Final    Comment: (NOTE) The Xpert Xpress SARS-CoV-2/FLU/RSV assay is intended as an aid in  the diagnosis of influenza from Nasopharyngeal swab specimens and  should not be used as a sole basis for treatment. Nasal washings and  aspirates are unacceptable for Xpert Xpress SARS-CoV-2/FLU/RSV  testing. Fact Sheet for Patients: PinkCheek.be Fact Sheet for Healthcare Providers: GravelBags.it This test is not yet approved or cleared by the [place] FDA and  has been authorized for detection and/or diagnosis of SARS-CoV-2 by  FDA under an Emergency Use Authorization (EUA). This EUA will remain  in effect (meaning this test can be used) for the duration of the  Covid-19  declaration under Section 564(b)(1) of the Act, 21  U.S.C. section 360bbb-3(b)(1), unless the authorization is  terminated or revoked. Performed at [facility], [address] [place], [place] [address]   Culture, body fluid-bottle     Status: None (Preliminary result)   Collection Time: 05/22/19  9:12 AM   Specimen: Pleura  Result Value Ref Range Status   Specimen Description PLEURAL  Final   Special Requests BOTTLES DRAWN AEROBIC AND ANAEROBIC 10CC  Final   Culture   Final    NO GROWTH 2 DAYS Performed at [facility], [address], [place], [place] [address]    Report Status PENDING  Incomplete  Gram stain     Status: None   Collection Time: 05/22/19  9:13  AM   Specimen: Pleura; Body Fluid  Result Value Ref Range Status   Specimen Description PLEURAL  Final   Special Requests NONE  Final   Gram Stain   Final    NO ORGANISMS SEEN WBC PRESENT, PREDOMINANTLY MONONUCLEAR CYTOSPIN SMEAR Performed at [facility], [address] [place], [place] [address]    Report Status 05/22/2019 FINAL  Final  MRSA PCR Screening     Status: None   Collection Time: 05/22/19  2:55 PM   Specimen: Nasal Mucosa; Nasopharyngeal  Result Value Ref Range Status   MRSA by PCR NEGATIVE NEGATIVE Final    Comment:        The GeneXpert MRSA Assay (FDA approved for NASAL specimens only), is one component of a comprehensive MRSA colonization surveillance program. It is not intended to diagnose MRSA infection nor to guide or monitor treatment for MRSA infections. Performed at [facility], [address] [place], [place] [address]          Radiology Studies: No results found.      Scheduled Meds: . aspirin EC  81 mg Oral Daily  . Chlorhexidine Gluconate Cloth  6 each Topical Daily  . docusate sodium  100 mg Oral QHS  . enoxaparin (LOVENOX) injection  40 mg Subcutaneous Q24H  . furosemide  20 mg Intravenous Q12H  . gabapentin  100 mg Oral TID  . insulin aspart  0-15 Units  Subcutaneous TID WC  . insulin glargine  13 Units Subcutaneous QHS  . potassium chloride  40 mEq Oral Daily  . silver sulfADIAZINE   Topical BID  . simvastatin  40 mg Oral Daily  . sodium chloride flush  3 mL Intravenous Q12H  . vortioxetine HBr  10 mg Oral Daily   Continuous Infusions: . sodium chloride       LOS: 3 days    Time spent: 30 minutes    Kathie Dike, MD [facility]   If 7PM-7AM, please contact night-coverage [email or web address]  05/24/2019, 5:28 PM

## 2019-05-24 NOTE — Progress Notes (Signed)
Physical Therapy Treatment Patient Details Name: Jacqueline Bird MRN: [id number] DOB: 19-Dec-1950 Today's Date: 05/24/2019    History of Present Illness Jacqueline Bird  is a 69 y.o. female,with history of gynecological cancer, finished radiation and chemotherapy the week of Nov 20th, presents to the ED with a chief complaint of shortness of breath.  Patient reports that the shortness of breath started last night.  It was gradual in onset and progressively worsening all night.  Patient cannot lie flat and reports that she used 5 pillows to prop her up last night.  This is not normal for her she is usually able to lay flat.  Patient reports an associated shallow dry cough.  She also has an associated wheeze.  She reports that she has had a history of wheeze in the past with history of bronchitis.  Patient denies any recent fever.  Patient admits that she has just finished radiation and chemotherapy in November 20, and that she had 3 fevers since then the last one being last week.  Patient denies any chest pain.  She admits to palpitations with exertion.  When questioned about the shortness of breath being exertional patient seems confused.  She reports that she is not able to ambulate due to extreme fatigue and malaise, so she would not know if the shortness of breath was exertional.  She also goes on to say that the shortness of breath that started last night.  Patient has no sick contacts.  Patient lives alone although her children drop into check on her.  Patient has no history of CHF.  1 of patient's chemotherapy medications was fluorouracil which does have cardiotoxic side effects.    PT Comments    Pt progressing well.  Able to complete bed mobility and transfer training independently with only assistance following bed mobility due to slippery sheets for fall prevention.  Good cadence and able to ambulate 50 feet with no LOB episodes.  Able to complete standing exercises with no LOB, cueing to slow  down and complete exercises with full range.   EOS pt left in bed laying on side with call bell within reach and daughter in room.   Follow Up Recommendations  No PT follow up;Supervision - Intermittent     Equipment Recommendations  None recommended by PT    Recommendations for Other Services       Precautions / Restrictions Precautions Precautions: Fall Restrictions Weight Bearing Restrictions: No    Mobility  Bed Mobility Overal bed mobility: Independent             General bed mobility comments: has to roll onto stomach to sit up due to irritated skin over buttocks due to Rad & Chemo Rx  Transfers Overall transfer level: Independent Equipment used: None             General transfer comment: standing independently  Ambulation/Gait Ambulation/Gait assistance: Modified independent (Device/Increase time);Supervision Gait Distance (Feet): 50 Feet Assistive device: None Gait Pattern/deviations: Decreased step length - right;Decreased step length - left;Decreased stride length     General Gait Details: Good cadence/ increased distance with no rest breaks required.  O2 saturation at 87% through gait with HFNC assistance.  No LOB episodes   Stairs             Wheelchair Mobility    Modified Rankin (Stroke Patients Only)       Balance  Cognition Arousal/Alertness: Awake/alert Behavior During Therapy: WFL for tasks assessed/performed Overall Cognitive Status: Within Functional Limits for tasks assessed                                        Exercises General Exercises - Lower Extremity Hip ABduction/ADduction: Both;10 reps;Standing Hip Flexion/Marching: Both;10 reps;Standing    General Comments        Pertinent Vitals/Pain Pain Score: 2  Pain Location: Reports some pain in buttocks following gait Pain Descriptors / Indicators: Burning;Sore;Grimacing Pain  Intervention(s): Monitored during session;Limited activity within patient's tolerance;Repositioned    Home Living                      Prior Function            PT Goals (current goals can now be found in the care plan section)      Frequency    Min 3X/week      PT Plan Current plan remains appropriate    Co-evaluation              AM-PAC PT "6 Clicks" Mobility   Outcome Measure  Help needed turning from your back to your side while in a flat bed without using bedrails?: None Help needed moving from lying on your back to sitting on the side of a flat bed without using bedrails?: A Little Help needed moving to and from a bed to a chair (including a wheelchair)?: None Help needed standing up from a chair using your arms (e.g., wheelchair or bedside chair)?: None Help needed to walk in hospital room?: A Little Help needed climbing 3-5 steps with a railing? : A Little 6 Click Score: 21    End of Session Equipment Utilized During Treatment: Oxygen;Gait belt Activity Tolerance: Patient tolerated treatment well;Patient limited by fatigue Patient left: in bed;with call bell/phone within reach;with family/visitor present Nurse Communication: Mobility status PT Visit Diagnosis: Unsteadiness on feet (R26.81);Other abnormalities of gait and mobility (R26.89);Muscle weakness (generalized) (M62.81)     Time: [id number] PT Time Calculation (min) (ACUTE ONLY): 24 min  Charges:  $Therapeutic Activity: 23-37 mins                     [address], LPTA; CBIS [phone]  Aldona Lento 05/24/2019, 12:21 PM

## 2019-05-24 NOTE — Progress Notes (Signed)
Pt resting with eyes closed, respirations even and unlabored. Appears to be in NAD. Will continue to monitor.

## 2019-05-25 ENCOUNTER — Inpatient Hospital Stay (HOSPITAL_COMMUNITY): Payer: Medicare HMO

## 2019-05-25 ENCOUNTER — Encounter (HOSPITAL_COMMUNITY): Payer: Self-pay | Admitting: Family Medicine

## 2019-05-25 ENCOUNTER — Telehealth (HOSPITAL_COMMUNITY): Payer: Self-pay | Admitting: Physical Therapy

## 2019-05-25 LAB — BASIC METABOLIC PANEL
Anion gap: 8 (ref 5–15)
BUN: 14 mg/dL (ref 8–23)
CO2: 26 mmol/L (ref 22–32)
Calcium: 8.2 mg/dL — ABNORMAL LOW (ref 8.9–10.3)
Chloride: 101 mmol/L (ref 98–111)
Creatinine, Ser: 0.58 mg/dL (ref 0.44–1.00)
GFR calc Af Amer: >60 mL/min (ref >60)
GFR calc non Af Amer: >60 mL/min (ref >60)
Glucose, Bld: 110 mg/dL — ABNORMAL HIGH (ref 70–99)
Potassium: 4.5 mmol/L (ref 3.5–5.1)
Sodium: 135 mmol/L (ref 135–145)

## 2019-05-25 LAB — GLUCOSE, CAPILLARY
Glucose-Capillary: 109 mg/dL — ABNORMAL HIGH (ref 70–99)
Glucose-Capillary: 122 mg/dL — ABNORMAL HIGH (ref 70–99)
Glucose-Capillary: 110 mg/dL — ABNORMAL HIGH (ref 70–99)
Glucose-Capillary: 131 mg/dL — ABNORMAL HIGH (ref 70–99)

## 2019-05-25 LAB — BODY FLUID CELL COUNT WITH DIFFERENTIAL
Eos, Fluid: 0 %
Lymphs, Fluid: 70 %
Monocyte-Macrophage-Serous Fluid: 25 % — ABNORMAL LOW (ref 50–90)
Neutrophil Count, Fluid: 5 % (ref 0–25)
Other Cells, Fluid: 1 %
Total Nucleated Cell Count, Fluid: 409 cu mm (ref 0–1000)

## 2019-05-25 LAB — PROTEIN, PLEURAL OR PERITONEAL FLUID: Total protein, fluid: <3 g/dL

## 2019-05-25 NOTE — Procedures (Addendum)
  Bilateral Pleural effusions  Rt thoracentesis 1/4: 530 cc yellow fluid  Thoracentesis ordered today per MD-- continued SOB  Limited Chest US performed revealing very little pleural effusion on RT Moderate effusion on Left  Left thoracentesis performed using US guidance  600 cc yellow fluid obtained Sent for labs per MD  EBL minimal  CXR No Ptx per Dr Thornton Papas

## 2019-05-25 NOTE — Progress Notes (Signed)
PROGRESS NOTE    Jacqueline Bird  [id number] DOB: 11/16/1950 DOA: 05/21/2019 PCP: Lucia Gaskins, MD   Brief Narrative:  Per HPI: Jacqueline Bird  is a 69 y.o. female,with history of gynecological cancer, finished radiation and chemotherapy the week of Nov 20th, presents to the ED with a chief complaint of shortness of breath.  Patient reports that the shortness of breath started last night.  It was gradual in onset and progressively worsening all night.  Patient cannot lie flat and reports that she used 5 pillows to prop her up last night.  This is not normal for her she is usually able to lay flat.  Patient reports an associated shallow dry cough.  She also has an associated wheeze.  She reports that she has had a history of wheeze in the past with history of bronchitis.  Patient denies any recent fever.  Patient admits that she has just finished radiation and chemotherapy in November 20, and that she had 3 fevers since then the last one being last week.  Patient denies any chest pain.  She admits to palpitations with exertion.  When questioned about the shortness of breath being exertional patient seems confused.  She reports that she is not able to ambulate due to extreme fatigue and malaise, so she would not know if the shortness of breath was exertional.  She also goes on to say that the shortness of breath that started last night.  Patient has no sick contacts.  Patient lives alone although her children drop into check on her.  Patient has no history of CHF.  1 of patient's chemotherapy medications was fluorouracil which does have cardiotoxic side effects.  Patient received Ativan in the ER.  She reports that she does have a history of anxiety.  She reports that Dr. Raliegh Ip has given her medication for anxiety at home with, but she has not been taking it.  Med rec reveals that she is on Trintellix, trazodone, and Xanax.   Assessment & Plan:   Active Problems:   Anal cancer (HCC)   CHF  (congestive heart failure) (HCC)   Acute diastolic CHF (congestive heart failure) (HCC)   Acute respiratory failure with hypoxia (HCC)   Hypokalemia   Essential hypertension   Type 2 diabetes mellitus with other specified complication (HCC)   Anxiety   Acute hypoxemic respiratory failure secondary to new onset acute diastolic CHF decompensation with associated pleural effusions -Required BiPAP on 1/4, but now on high flow nasal cannula. -Continue to wean down oxygen as tolerated.  Currently on 6 L. -Continue Lasix 20mg  IV BID -Patient has high volume urine output with diuretics. -Weight is down approximately 10 pounds in the last few days. -S/P R sided thoracentesis 1/4 with fluid analysis indicated transudate of process.  Left-sided thoracentesis performed 1/7 -2D echo with LVEF 50-55% and grade 1 diastolic dysfunction -Discussed with Dr. Delton Coombes who did not feel that chemotherapy was causing heart failure. -Will need follow-up with cardiology regarding further management.   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 Delton Coombes outpatient   DVT prophylaxis:Lovenox Code Status: Full Family Communication: Discussed with daughter over the phone Disposition Plan: Continue intravenous diuresis.     Consultants:   None  Procedures:   R sided thoracentesis 1/4  Left-sided thoracentesis 1/7  Antimicrobials:    Anti-infectives (From admission, onward)   None      Subjective: Patient had thoracentesis performed today and feels respiratory status is better.  Did not require BiPAP overnight.  Continues with diuresis.  Objective: Vitals:   05/25/19 1141 05/25/19 1205 05/25/19 1632 05/25/19 1700  BP: (!) 95/35 (!) 117/53  (!) 127/58  Pulse: 99 99 88 88  Resp: 20 15 14 18   Temp:   98.7 F (37.1 C)   TempSrc:   Oral   SpO2: 98% 91% 96% 95%  Weight:      Height:        Intake/Output Summary (Last 24 hours) at 05/25/2019 2119 Last data filed at 05/25/2019 0600 Gross per 24 hour  Intake 0 ml  Output 1650 ml  Net -1650 ml   Filed Weights   05/24/19 0500 05/24/19 1216 05/25/19 0500  Weight: 91.8 kg 90.2 kg 87.3 kg    Examination:  General exam: Alert, awake, oriented x 3 Respiratory system: Clear to auscultation. Respiratory effort normal. Cardiovascular system:RRR. No murmurs, rubs, gallops. Gastrointestinal system: Abdomen is nondistended, soft and nontender. No organomegaly or masses felt. Normal bowel sounds heard. Central nervous system: Alert and oriented. No focal neurological deficits. Extremities: No C/C/E, +pedal pulses Skin: No rashes, lesions or ulcers Psychiatry: Judgement and insight appear normal. Mood & affect appropriate.      Data Reviewed: I have personally reviewed following labs and imaging studies  CBC: Recent Labs  Lab 05/21/19 1455 05/22/19 0425 05/23/19 0755  WBC 6.3 6.5 4.4  NEUTROABS 4.3 4.0  --   HGB 10.8* 11.0* 9.9*  HCT 35.5* 36.6 32.8*  MCV 102.3* 100.5* 101.2*  PLT 377 373 [id number]   Basic Metabolic Panel: Recent Labs  Lab 05/21/19 1455 05/21/19 2004 05/22/19 0425 05/23/19 0755 05/24/19 0414 05/25/19 0446  NA 137  --  142 139 137 135  K 3.1*  --  2.7* 3.6 3.5 4.5  CL 103  --  104 104 99 101  CO2 25  --  27 30 29 26   GLUCOSE 174*  --  42* 84 96 110*  BUN 20  --  18 13 15 14   CREATININE 0.57  --  0.53 0.46 0.56 0.58  CALCIUM 8.1*  --   8.5* 8.0* 8.2* 8.2*  MG  --  2.0 2.0 1.8  --   --    GFR: Estimated Creatinine Clearance: 72.9 mL/min (by C-G formula based on SCr of 0.58 mg/dL). Liver Function Tests: Recent Labs  Lab 05/22/19 0425  AST 20  ALT 13  ALKPHOS 42  BILITOT 0.7  PROT 6.4*  ALBUMIN 2.4*   No results for input(s): LIPASE, AMYLASE in the last 168 hours. No results for input(s): AMMONIA in the last 168 hours. Coagulation Profile: No results for input(s): INR, PROTIME in the last 168 hours. Cardiac Enzymes: No results for input(s): CKTOTAL, CKMB, CKMBINDEX, TROPONINI in the last 168 hours. BNP (last 3 results) No results for input(s): PROBNP in the last 8760 hours. HbA1C: No results for input(s): HGBA1C in the last 72 hours. CBG: Recent Labs  Lab 05/24/19 1602 05/24/19 2136 05/25/19 0822 05/25/19 1207 05/25/19 1634  GLUCAP 118* 147* 109* 122* 110*  Lipid Profile: No results for input(s): CHOL, HDL, LDLCALC, TRIG, CHOLHDL, LDLDIRECT in the last 72 hours. Thyroid Function Tests: No results for input(s): TSH, T4TOTAL, FREET4, T3FREE, THYROIDAB in the last 72 hours. Anemia Panel: No results for input(s): VITAMINB12, FOLATE, FERRITIN, TIBC, IRON, RETICCTPCT in the last 72 hours. Sepsis Labs: Recent Labs  Lab 05/21/19 1556  LATICACIDVEN 1.3    Recent Results (from the past 240 hour(s))  Respiratory Panel by RT PCR (Flu A&B, Covid) - Nasopharyngeal Swab     Status: None   Collection Time: 05/21/19  3:44 PM   Specimen: Nasopharyngeal Swab  Result Value Ref Range Status   SARS Coronavirus 2 by RT PCR NEGATIVE NEGATIVE Final    Comment: (NOTE) SARS-CoV-2 target nucleic acids are NOT DETECTED. The SARS-CoV-2 RNA is generally detectable in upper respiratoy specimens during the acute phase of infection. The lowest concentration of SARS-CoV-2 viral copies this assay can detect is 131 copies/mL. A negative result does not preclude SARS-Cov-2 infection and should not be used as the sole basis for  treatment or other patient management decisions. A negative result may occur with  improper specimen collection/handling, submission of specimen other than nasopharyngeal swab, presence of viral mutation(s) within the areas targeted by this assay, and inadequate number of viral copies (<131 copies/mL). A negative result must be combined with clinical observations, patient history, and epidemiological information. The expected result is Negative. Fact Sheet for Patients:  PinkCheek.be Fact Sheet for Healthcare Providers:  GravelBags.it This test is not yet ap proved or cleared by the [place] FDA and  has been authorized for detection and/or diagnosis of SARS-CoV-2 by FDA under an Emergency Use Authorization (EUA). This EUA will remain  in effect (meaning this test can be used) for the duration of the COVID-19 declaration under Section 564(b)(1) of the Act, 21 U.S.C. section 360bbb-3(b)(1), unless the authorization is terminated or revoked sooner.    Influenza A by PCR NEGATIVE NEGATIVE Final   Influenza B by PCR NEGATIVE NEGATIVE Final    Comment: (NOTE) The Xpert Xpress SARS-CoV-2/FLU/RSV assay is intended as an aid in  the diagnosis of influenza from Nasopharyngeal swab specimens and  should not be used as a sole basis for treatment. Nasal washings and  aspirates are unacceptable for Xpert Xpress SARS-CoV-2/FLU/RSV  testing. Fact Sheet for Patients: PinkCheek.be Fact Sheet for Healthcare Providers: GravelBags.it This test is not yet approved or cleared by the [place] FDA and  has been authorized for detection and/or diagnosis of SARS-CoV-2 by  FDA under an Emergency Use Authorization (EUA). This EUA will remain  in effect (meaning this test can be used) for the duration of the  Covid-19 declaration under Section 564(b)(1) of the Act, 21  U.S.C. section  360bbb-3(b)(1), unless the authorization is  terminated or revoked. Performed at [facility], [address], [place], [place] [address]   Culture, body fluid-bottle     Status: None (Preliminary result)   Collection Time: 05/22/19  9:12 AM   Specimen: Pleura  Result Value Ref Range Status   Specimen Description PLEURAL  Final   Special Requests BOTTLES DRAWN AEROBIC AND ANAEROBIC 10CC  Final   Culture   Final    NO GROWTH 3 DAYS Performed at [facility], [address], [place], [place] [address]    Report Status PENDING  Incomplete  Gram stain     Status: None   Collection Time: 05/22/19  9:13 AM   Specimen: Pleura; Body Fluid  Result Value Ref  Range Status   Specimen Description PLEURAL  Final   Special Requests NONE  Final   Gram Stain   Final    NO ORGANISMS SEEN WBC PRESENT, PREDOMINANTLY MONONUCLEAR CYTOSPIN SMEAR Performed at [facility], [address] [place], [place] [address]    Report Status 05/22/2019 FINAL  Final  MRSA PCR Screening     Status: None   Collection Time: 05/22/19  2:55 PM   Specimen: Nasal Mucosa; Nasopharyngeal  Result Value Ref Range Status   MRSA by PCR NEGATIVE NEGATIVE Final    Comment:        The GeneXpert MRSA Assay (FDA approved for NASAL specimens only), is one component of a comprehensive MRSA colonization surveillance program. It is not intended to diagnose MRSA infection nor to guide or monitor treatment for MRSA infections. Performed at [facility], [address] [place], [place] [address]          Radiology Studies: DG Chest 1 View  Result Date: 05/25/2019 CLINICAL DATA:  Post thoracentesis EXAM: CHEST  1 VIEW COMPARISON:  Expiratory exam 1139 hours compared to 0509 hours FINDINGS: Stable LEFT subclavian Port-A-Cath. Enlargement of cardiac silhouette with vascular congestion. Persistent pulmonary infiltrates. Decreased LEFT pleural effusion post thoracentesis. No pneumothorax. IMPRESSION: No pneumothorax following LEFT  thoracentesis. Persistent pulmonary infiltrates. Electronically Signed   By: Lavonia Dana M.D.   On: 05/25/2019 12:06   DG CHEST PORT 1 VIEW  Result Date: 05/25/2019 CLINICAL DATA:  Shortness of breath. EXAM: PORTABLE CHEST 1 VIEW COMPARISON:  05/21/2019. FINDINGS: PowerPort catheter noted with tip over SVC. Heart size stable. Diffuse progressive bilateral pulmonary infiltrates/edema. Low lung volumes with bibasilar atelectasis. Moderate bilateral pleural effusions. No pneumothorax. IMPRESSION: 1. Diffuse progressive bilateral pulmonary infiltrates/edema. Moderate bilateral pleural effusions. Findings suggest CHF. Bilateral pneumonia cannot be excluded. 2.  Low lung volumes with bibasilar atelectasis. Electronically Signed   By: Marcello Moores  Register   On: 05/25/2019 06:32   US THORACENTESIS ASP PLEURAL SPACE W/IMG GUIDE  Result Date: 05/25/2019 INDICATION: SOB Bilat pleural effusions Right thoracentesis 1/4: 530 cc Left thoracentesis today EXAM: ULTRASOUND GUIDED Left THORACENTESIS MEDICATIONS: 10 cc 1% lidocaine COMPLICATIONS: None immediate PROCEDURE: An ultrasound guided thoracentesis was thoroughly discussed with the patient and questions answered. The benefits, risks, alternatives and complications were also discussed. The patient understands and wishes to proceed with the procedure. Written consent was obtained. Ultrasound was performed to localize and mark an adequate pocket of fluid in the left chest. The area was then prepped and draped in the normal sterile fashion. 1% Lidocaine was used for local anesthesia. Under ultrasound guidance a 19 gauge 7 cm Yueh catheter was introduced. Thoracentesis was performed. The catheter was removed and a dressing applied. FINDINGS: A total of approximately 600 cc of yellow fluid was removed. Samples were sent to the laboratory as requested by the clinical team. IMPRESSION: Successful ultrasound guided left thoracentesis yielding 600 cc of pleural fluid. Read by Lavonia Drafts [facility] Electronically Signed   By: Lavonia Dana M.D.   On: 05/25/2019 11:48        Scheduled Meds: . aspirin EC  81 mg Oral Daily  . Chlorhexidine Gluconate Cloth  6 each Topical Daily  . docusate sodium  100 mg Oral QHS  . enoxaparin (LOVENOX) injection  40 mg Subcutaneous Q24H  . furosemide  20 mg Intravenous Q12H  . gabapentin  100 mg Oral TID  . insulin aspart  0-15 Units Subcutaneous TID WC  . insulin glargine  13  Units Subcutaneous QHS  . potassium chloride  40 mEq Oral Daily  . silver sulfADIAZINE   Topical BID  . simvastatin  40 mg Oral Daily  . sodium chloride flush  3 mL Intravenous Q12H  . vortioxetine HBr  10 mg Oral Daily   Continuous Infusions: . sodium chloride       LOS: 4 days    Time spent: 30 minutes    Kathie Dike, MD [facility]   If 7PM-7AM, please contact night-coverage [email or web address]  05/25/2019, 9:19 PM

## 2019-05-25 NOTE — Telephone Encounter (Signed)
[facility] is in [facility] with fluid around the heart appt will be cancelled

## 2019-05-25 NOTE — Progress Notes (Signed)
Present with Jacqueline Bird for emotional and spiritual support. She was referred by [place]. Spiritual assessment focused on her faith-faithful member of local church-and how she is connected with this community as she has gone through her cancer diagnosis/medical interventions. Her narrative focused on how she was struggling to cope with the changes of her health, feelings of sadness and lack of control. She shared  processing feelings of abandonment as she also shared deep connection within her family. She authentically shared much about her stories of loss. We also connected her back to her faith in hope and encouragement.

## 2019-05-26 ENCOUNTER — Ambulatory Visit (HOSPITAL_COMMUNITY): Payer: Medicare HMO | Admitting: Physical Therapy

## 2019-05-26 LAB — GLUCOSE, CAPILLARY
Glucose-Capillary: 91 mg/dL (ref 70–99)
Glucose-Capillary: 122 mg/dL — ABNORMAL HIGH (ref 70–99)
Glucose-Capillary: 120 mg/dL — ABNORMAL HIGH (ref 70–99)
Glucose-Capillary: 162 mg/dL — ABNORMAL HIGH (ref 70–99)

## 2019-05-26 LAB — BASIC METABOLIC PANEL
Anion gap: 9 (ref 5–15)
BUN: 12 mg/dL (ref 8–23)
CO2: 27 mmol/L (ref 22–32)
Calcium: 8.5 mg/dL — ABNORMAL LOW (ref 8.9–10.3)
Chloride: 99 mmol/L (ref 98–111)
Creatinine, Ser: 0.54 mg/dL (ref 0.44–1.00)
GFR calc Af Amer: >60 mL/min (ref >60)
GFR calc non Af Amer: >60 mL/min (ref >60)
Glucose, Bld: 112 mg/dL — ABNORMAL HIGH (ref 70–99)
Potassium: 3.7 mmol/L (ref 3.5–5.1)
Sodium: 135 mmol/L (ref 135–145)

## 2019-05-26 LAB — PATHOLOGIST SMEAR REVIEW

## 2019-05-26 MED ORDER — DIPHENHYDRAMINE HCL 25 MG PO CAPS
25.0000 mg | ORAL_CAPSULE | Freq: Four times a day (QID) | ORAL | Status: DC | PRN
  Administered 2019-05-26: 25 mg via ORAL
  Filled 2019-05-26: qty 1

## 2019-05-26 MED ORDER — MAGNESIUM HYDROXIDE 400 MG/5ML PO SUSP
30.0000 mL | Freq: Once | ORAL | Status: AC
  Administered 2019-05-26: 30 mL via ORAL
  Filled 2019-05-26: qty 30

## 2019-05-26 NOTE — Care Management Important Message (Signed)
Important Message  Patient Details  Name: Jacqueline Bird MRN: [id number] Date of Birth: 11-05-1950   Medicare Important Message Given:  Yes(RN agreed to deliver letter to patient)     Tommy Medal 05/26/2019, 4:26 PM

## 2019-05-26 NOTE — Progress Notes (Signed)
Physical Therapy Treatment Patient Details Name: Jacqueline Bird MRN: [id number] DOB: 1950-06-29 Today's Date: 05/26/2019    History of Present Illness Jacqueline Bird  is a 69 y.o. female,with history of gynecological cancer, finished radiation and chemotherapy the week of Nov 20th, presents to the ED with a chief complaint of shortness of breath.  Patient reports that the shortness of breath started last night.  It was gradual in onset and progressively worsening all night.  Patient cannot lie flat and reports that she used 5 pillows to prop her up last night.  This is not normal for her she is usually able to lay flat.  Patient reports an associated shallow dry cough.  She also has an associated wheeze.  She reports that she has had a history of wheeze in the past with history of bronchitis.  Patient denies any recent fever.  Patient admits that she has just finished radiation and chemotherapy in November 20, and that she had 3 fevers since then the last one being last week.  Patient denies any chest pain.  She admits to palpitations with exertion.  When questioned about the shortness of breath being exertional patient seems confused.  She reports that she is not able to ambulate due to extreme fatigue and malaise, so she would not know if the shortness of breath was exertional.  She also goes on to say that the shortness of breath that started last night.  Patient has no sick contacts.  Patient lives alone although her children drop into check on her.  Patient has no history of CHF.  1 of patient's chemotherapy medications was fluorouracil which does have cardiotoxic side effects.    PT Comments    Increased distance with gait training, no LOB episodes.  Was limited by fatigue.  Pt able to keep O2 saturation at 96% room, decreased saturation to 87% on room air.  Reapplied nasal cannula with O2 sat at 96%.  No reports of pain today.    Follow Up Recommendations  No PT follow up;Supervision -  Intermittent     Equipment Recommendations  None recommended by PT    Recommendations for Other Services       Precautions / Restrictions Precautions Precautions: Fall Restrictions Weight Bearing Restrictions: No    Mobility  Bed Mobility Overal bed mobility: Independent             General bed mobility comments: has to roll onto stomach to sit up due to irritated skin over buttocks due to Rad & Chemo Rx  Transfers Overall transfer level: Independent Equipment used: None             General transfer comment: standing independently  Ambulation/Gait Ambulation/Gait assistance: Modified independent (Device/Increase time);Supervision Gait Distance (Feet): 100 Feet Assistive device: None Gait Pattern/deviations: Decreased step length - right;Decreased step length - left;Decreased stride length     General Gait Details: Slow controlled candence, no LOB episodes.  Trial without nasal cannula.  able to stand at 96% room air, did reduce to 87% following 100 ft gait.  Returned to nasal cannula   Stairs             Wheelchair Mobility    Modified Rankin (Stroke Patients Only)       Balance  Cognition Arousal/Alertness: Awake/alert Behavior During Therapy: WFL for tasks assessed/performed Overall Cognitive Status: Within Functional Limits for tasks assessed                                        Exercises      General Comments        Pertinent Vitals/Pain Pain Assessment: No/denies pain    Home Living                      Prior Function            PT Goals (current goals can now be found in the care plan section)      Frequency    Min 3X/week      PT Plan Current plan remains appropriate    Co-evaluation              AM-PAC PT "6 Clicks" Mobility   Outcome Measure  Help needed turning from your back to your side while in a flat bed  without using bedrails?: None Help needed moving from lying on your back to sitting on the side of a flat bed without using bedrails?: None Help needed moving to and from a bed to a chair (including a wheelchair)?: None Help needed standing up from a chair using your arms (e.g., wheelchair or bedside chair)?: None Help needed to walk in hospital room?: None Help needed climbing 3-5 steps with a railing? : A Little 6 Click Score: 23    End of Session Equipment Utilized During Treatment: Gait belt Activity Tolerance: Patient tolerated treatment well;Patient limited by fatigue Patient left: in bed;with call bell/phone within reach;with family/visitor present(Sidelying for comfort) Nurse Communication: Mobility status PT Visit Diagnosis: Unsteadiness on feet (R26.81);Other abnormalities of gait and mobility (R26.89);Muscle weakness (generalized) (M62.81)     Time: [id number] PT Time Calculation (min) (ACUTE ONLY): 23 min  Charges:  $Therapeutic Activity: 23-37 mins                     [address], LPTA; CBIS [phone]  Aldona Lento 05/26/2019, 12:15 PM

## 2019-05-26 NOTE — Progress Notes (Signed)
PROGRESS NOTE    Jacqueline Bird  [id number] DOB: Feb 25, 1951 DOA: 05/21/2019 PCP: Lucia Gaskins, MD   Brief Narrative:  Per HPI: Jacqueline Bird  is a 69 y.o. female,with history of gynecological cancer, finished radiation and chemotherapy the week of Nov 20th, presents to the ED with a chief complaint of shortness of breath.  Patient reports that the shortness of breath started last night.  It was gradual in onset and progressively worsening all night.  Patient cannot lie flat and reports that she used 5 pillows to prop her up last night.  This is not normal for her she is usually able to lay flat.  Patient reports an associated shallow dry cough.  She also has an associated wheeze.  She reports that she has had a history of wheeze in the past with history of bronchitis.  Patient denies any recent fever.  Patient admits that she has just finished radiation and chemotherapy in November 20, and that she had 3 fevers since then the last one being last week.  Patient denies any chest pain.  She admits to palpitations with exertion.  When questioned about the shortness of breath being exertional patient seems confused.  She reports that she is not able to ambulate due to extreme fatigue and malaise, so she would not know if the shortness of breath was exertional.  She also goes on to say that the shortness of breath that started last night.  Patient has no sick contacts.  Patient lives alone although her children drop into check on her.  Patient has no history of CHF.  1 of patient's chemotherapy medications was fluorouracil which does have cardiotoxic side effects.  Patient received Ativan in the ER.  She reports that she does have a history of anxiety.  She reports that Dr. Raliegh Ip has given her medication for anxiety at home with, but she has not been taking it.  Med rec reveals that she is on Trintellix, trazodone, and Xanax.   Assessment & Plan:   Active Problems:   Anal cancer (HCC)   CHF  (congestive heart failure) (HCC)   Acute diastolic CHF (congestive heart failure) (HCC)   Acute respiratory failure with hypoxia (HCC)   Hypokalemia   Essential hypertension   Type 2 diabetes mellitus with other specified complication (HCC)   Anxiety   Acute hypoxemic respiratory failure secondary to new onset acute diastolic CHF decompensation with associated pleural effusions -Required BiPAP on 1/4, but now on high flow nasal cannula. -Continue to wean down oxygen as tolerated.  Currently on 4 L. -Continue Lasix 20mg  IV BID -Anticipate transitioning to oral Lasix tomorrow -Patient has high volume urine output with diuretics. -Weight is down approximately 17 pounds since admission. -S/P R sided thoracentesis 1/4 with fluid analysis indicated transudate of process.  Left-sided thoracentesis performed 1/7 -2D echo with LVEF 50-55% and grade 1 diastolic dysfunction -Discussed with Dr. Delton Coombes who did not feel that chemotherapy was causing heart failure. -Will need follow-up with cardiology regarding further management.   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 Delton Coombes outpatient   DVT prophylaxis:Lovenox Code Status: Full Family Communication: Discussed with daughter over the phone Disposition Plan: Continue intravenous diuresis for another 24 hours.     Consultants:   None  Procedures:   R sided thoracentesis  1/4  Left-sided thoracentesis 1/7  Antimicrobials:  Anti-infectives (From admission, onward)   None      Subjective: Continues to feel better.  Ambulated today on room air but became hypoxic.  Feels that shortness of breath is slowly improving..  Objective: Vitals:   05/26/19 0500 05/26/19 0741 05/26/19 1138 05/26/19 1211  BP: (!) 108/51     Pulse: 83  93   Resp: 17  17   Temp:  98.3 F (36.8 C) 98.4 F (36.9 C)   TempSrc:  Oral Oral   SpO2: 97%  95% 95%  Weight:      Height:        Intake/Output Summary (Last 24 hours) at 05/26/2019 1225 Last data filed at 05/26/2019 0200 Gross per 24 hour  Intake --  Output 1700 ml  Net -1700 ml   Filed Weights   05/24/19 1216 05/25/19 0500 05/26/19 0400  Weight: 90.2 kg 87.3 kg 83.9 kg    Examination:  General exam: Alert, awake, oriented x 3 Respiratory system: Clear to auscultation. Respiratory effort normal. Cardiovascular system:RRR. No murmurs, rubs, gallops. Gastrointestinal system: Abdomen is nondistended, soft and nontender. No organomegaly or masses felt. Normal bowel sounds heard. Central nervous system: Alert and oriented. No focal neurological deficits. Extremities: Left hand amputation, 1+ pedal edema bilaterally Skin: No rashes, lesions or ulcers Psychiatry: Judgement and insight appear normal. Mood & affect appropriate.  .      Data Reviewed: I have personally reviewed following labs and imaging studies  CBC: Recent Labs  Lab 05/21/19 1455 05/22/19 0425 05/23/19 0755  WBC 6.3 6.5 4.4  NEUTROABS 4.3 4.0  --   HGB 10.8* 11.0* 9.9*  HCT 35.5* 36.6 32.8*  MCV 102.3* 100.5* 101.2*  PLT 377 373 [id number]   Basic Metabolic Panel: Recent Labs  Lab 05/21/19 2004 05/22/19 0425 05/23/19 0755 05/24/19 0414 05/25/19 0446 05/26/19 0441  NA  --  142 139 137 135 135  K  --  2.7* 3.6 3.5 4.5 3.7  CL  --  104 104 99 101 99  CO2  --  27 30 29 26 27   GLUCOSE  --  42* 84 96 110* 112*  BUN  --  18 13 15 14 12    CREATININE  --  0.53 0.46 0.56 0.58 0.54  CALCIUM  --  8.5* 8.0* 8.2* 8.2* 8.5*  MG 2.0 2.0 1.8  --   --   --    GFR: Estimated Creatinine Clearance: 71.5 mL/min (by C-G formula based on SCr of 0.54 mg/dL). Liver Function Tests: Recent Labs  Lab 05/22/19 0425  AST 20  ALT 13  ALKPHOS 42  BILITOT 0.7  PROT 6.4*  ALBUMIN 2.4*   No results for input(s): LIPASE, AMYLASE in the last 168 hours. No results for input(s): AMMONIA in the last 168 hours. Coagulation Profile: No results for input(s): INR, PROTIME in the last 168 hours. Cardiac Enzymes: No results for input(s): CKTOTAL, CKMB, CKMBINDEX, TROPONINI in the last 168 hours. BNP (last 3 results) No results for input(s): PROBNP in the last 8760 hours. HbA1C: No results for input(s): HGBA1C in the last 72 hours. CBG: Recent Labs  Lab 05/25/19 1207 05/25/19 1634 05/25/19 2207  05/26/19 0808 05/26/19 1110  GLUCAP 122* 110* 131* 91 122*   Lipid Profile: No results for input(s): CHOL, HDL, LDLCALC, TRIG, CHOLHDL, LDLDIRECT in the last 72 hours. Thyroid Function Tests: No results for input(s): TSH, T4TOTAL, FREET4, T3FREE, THYROIDAB in the last 72 hours. Anemia Panel: No results for input(s): VITAMINB12, FOLATE, FERRITIN, TIBC, IRON, RETICCTPCT in the last 72 hours. Sepsis Labs: Recent Labs  Lab 05/21/19 1556  LATICACIDVEN 1.3    Recent Results (from the past 240 hour(s))  Respiratory Panel by RT PCR (Flu A&B, Covid) - Nasopharyngeal Swab     Status: None   Collection Time: 05/21/19  3:44 PM   Specimen: Nasopharyngeal Swab  Result Value Ref Range Status   SARS Coronavirus 2 by RT PCR NEGATIVE NEGATIVE Final    Comment: (NOTE) SARS-CoV-2 target nucleic acids are NOT DETECTED. The SARS-CoV-2 RNA is generally detectable in upper respiratoy specimens during the acute phase of infection. The lowest concentration of SARS-CoV-2 viral copies this assay can detect is 131 copies/mL. A negative result does not preclude  SARS-Cov-2 infection and should not be used as the sole basis for treatment or other patient management decisions. A negative result may occur with  improper specimen collection/handling, submission of specimen other than nasopharyngeal swab, presence of viral mutation(s) within the areas targeted by this assay, and inadequate number of viral copies (<131 copies/mL). A negative result must be combined with clinical observations, patient history, and epidemiological information. The expected result is Negative. Fact Sheet for Patients:  PinkCheek.be Fact Sheet for Healthcare Providers:  GravelBags.it This test is not yet ap proved or cleared by the [place] FDA and  has been authorized for detection and/or diagnosis of SARS-CoV-2 by FDA under an Emergency Use Authorization (EUA). This EUA will remain  in effect (meaning this test can be used) for the duration of the COVID-19 declaration under Section 564(b)(1) of the Act, 21 U.S.C. section 360bbb-3(b)(1), unless the authorization is terminated or revoked sooner.    Influenza A by PCR NEGATIVE NEGATIVE Final   Influenza B by PCR NEGATIVE NEGATIVE Final    Comment: (NOTE) The Xpert Xpress SARS-CoV-2/FLU/RSV assay is intended as an aid in  the diagnosis of influenza from Nasopharyngeal swab specimens and  should not be used as a sole basis for treatment. Nasal washings and  aspirates are unacceptable for Xpert Xpress SARS-CoV-2/FLU/RSV  testing. Fact Sheet for Patients: PinkCheek.be Fact Sheet for Healthcare Providers: GravelBags.it This test is not yet approved or cleared by the [place] FDA and  has been authorized for detection and/or diagnosis of SARS-CoV-2 by  FDA under an Emergency Use Authorization (EUA). This EUA will remain  in effect (meaning this test can be used) for the duration of the  Covid-19  declaration under Section 564(b)(1) of the Act, 21  U.S.C. section 360bbb-3(b)(1), unless the authorization is  terminated or revoked. Performed at [facility], [address] [place], [place] [address]   Culture, body fluid-bottle     Status: None (Preliminary result)   Collection Time: 05/22/19  9:12 AM   Specimen: Pleura  Result Value Ref Range Status   Specimen Description PLEURAL  Final   Special Requests BOTTLES DRAWN AEROBIC AND ANAEROBIC 10CC  Final   Culture   Final    NO GROWTH 4 DAYS Performed at [facility], [address], [place], [place] [address]    Report Status PENDING  Incomplete  Gram stain     Status: None   Collection Time: 05/22/19  9:13 AM   Specimen: Pleura; Body Fluid  Result Value Ref Range Status   Specimen Description PLEURAL  Final   Special Requests NONE  Final   Gram Stain   Final    NO ORGANISMS SEEN WBC PRESENT, PREDOMINANTLY MONONUCLEAR CYTOSPIN SMEAR Performed at [facility], [address], [place], [place] [address]    Report Status 05/22/2019 FINAL  Final  MRSA PCR Screening     Status: None   Collection Time: 05/22/19  2:55 PM   Specimen: Nasal Mucosa; Nasopharyngeal  Result Value Ref Range Status   MRSA by PCR NEGATIVE NEGATIVE Final    Comment:        The GeneXpert MRSA Assay (FDA approved for NASAL specimens only), is one component of a comprehensive MRSA colonization surveillance program. It is not intended to diagnose MRSA infection nor to guide or monitor treatment for MRSA infections. Performed at [facility], [address] [place], [place] [address]          Radiology Studies: DG Chest 1 View  Result Date: 05/25/2019 CLINICAL DATA:  Post thoracentesis EXAM: CHEST  1 VIEW COMPARISON:  Expiratory exam 1139 hours compared to 0509 hours FINDINGS: Stable LEFT subclavian Port-A-Cath. Enlargement of cardiac silhouette with vascular congestion. Persistent pulmonary infiltrates. Decreased LEFT pleural effusion post  thoracentesis. No pneumothorax. IMPRESSION: No pneumothorax following LEFT thoracentesis. Persistent pulmonary infiltrates. Electronically Signed   By: Lavonia Dana M.D.   On: 05/25/2019 12:06   DG CHEST PORT 1 VIEW  Result Date: 05/25/2019 CLINICAL DATA:  Shortness of breath. EXAM: PORTABLE CHEST 1 VIEW COMPARISON:  05/21/2019. FINDINGS: PowerPort catheter noted with tip over SVC. Heart size stable. Diffuse progressive bilateral pulmonary infiltrates/edema. Low lung volumes with bibasilar atelectasis. Moderate bilateral pleural effusions. No pneumothorax. IMPRESSION: 1. Diffuse progressive bilateral pulmonary infiltrates/edema. Moderate bilateral pleural effusions. Findings suggest CHF. Bilateral pneumonia cannot be excluded. 2.  Low lung volumes with bibasilar atelectasis. Electronically Signed   By: Marcello Moores  Register   On: 05/25/2019 06:32   US THORACENTESIS ASP PLEURAL SPACE W/IMG GUIDE  Result Date: 05/25/2019 INDICATION: SOB Bilat pleural effusions Right thoracentesis 1/4: 530 cc Left thoracentesis today EXAM: ULTRASOUND GUIDED Left THORACENTESIS MEDICATIONS: 10 cc 1% lidocaine COMPLICATIONS: None immediate PROCEDURE: An ultrasound guided thoracentesis was thoroughly discussed with the patient and questions answered. The benefits, risks, alternatives and complications were also discussed. The patient understands and wishes to proceed with the procedure. Written consent was obtained. Ultrasound was performed to localize and mark an adequate pocket of fluid in the left chest. The area was then prepped and draped in the normal sterile fashion. 1% Lidocaine was used for local anesthesia. Under ultrasound guidance a 19 gauge 7 cm Yueh catheter was introduced. Thoracentesis was performed. The catheter was removed and a dressing applied. FINDINGS: A total of approximately 600 cc of yellow fluid was removed. Samples were sent to the laboratory as requested by the clinical team. IMPRESSION: Successful ultrasound  guided left thoracentesis yielding 600 cc of pleural fluid. Read by Lavonia Drafts [facility] Electronically Signed   By: Lavonia Dana M.D.   On: 05/25/2019 11:48        Scheduled Meds: . aspirin EC  81 mg Oral Daily  . Chlorhexidine Gluconate Cloth  6 each Topical Daily  . docusate sodium  100 mg Oral QHS  . enoxaparin (LOVENOX) injection  40 mg Subcutaneous Q24H  . furosemide  20 mg Intravenous Q12H  . gabapentin  100 mg Oral TID  . insulin aspart  0-15 Units Subcutaneous TID WC  . insulin glargine  13 Units Subcutaneous QHS  . potassium chloride  40 mEq Oral Daily  . silver sulfADIAZINE   Topical BID  . simvastatin  40 mg Oral Daily  . sodium chloride flush  3 mL Intravenous Q12H  . vortioxetine HBr  10 mg Oral Daily   Continuous Infusions: . sodium chloride       LOS: 5 days    Time spent: 30 minutes    Kathie Dike, MD [facility]   If 7PM-7AM, please contact night-coverage [email or web address]  05/26/2019, 12:25 PM

## 2019-05-26 NOTE — Progress Notes (Signed)
Went in to give patient her night meds and she is complaining of itching all over. States she thinks it is from the new lasix order. She is refusing the night dose and requesting Benadryl. Paged midlevel

## 2019-05-27 DIAGNOSIS — J90 Pleural effusion, not elsewhere classified: Secondary | ICD-10-CM

## 2019-05-27 LAB — BASIC METABOLIC PANEL
Anion gap: 7 (ref 5–15)
BUN: 21 mg/dL (ref 8–23)
CO2: 31 mmol/L (ref 22–32)
Calcium: 8.6 mg/dL — ABNORMAL LOW (ref 8.9–10.3)
Chloride: 98 mmol/L (ref 98–111)
Creatinine, Ser: 0.72 mg/dL (ref 0.44–1.00)
GFR calc Af Amer: >60 mL/min (ref >60)
GFR calc non Af Amer: >60 mL/min (ref >60)
Glucose, Bld: 138 mg/dL — ABNORMAL HIGH (ref 70–99)
Potassium: 4.7 mmol/L (ref 3.5–5.1)
Sodium: 136 mmol/L (ref 135–145)

## 2019-05-27 LAB — CULTURE, BODY FLUID W GRAM STAIN -BOTTLE: Culture: NO GROWTH

## 2019-05-27 LAB — GLUCOSE, CAPILLARY
Glucose-Capillary: 131 mg/dL — ABNORMAL HIGH (ref 70–99)
Glucose-Capillary: 126 mg/dL — ABNORMAL HIGH (ref 70–99)

## 2019-05-27 MED ORDER — TRESIBA FLEXTOUCH 200 UNIT/ML ~~LOC~~ SOPN: 20.0000 [IU] | PEN_INJECTOR | Freq: Every day | SUBCUTANEOUS | Status: DC

## 2019-05-27 MED ORDER — FUROSEMIDE 40 MG PO TABS: 40.0000 mg | ORAL_TABLET | Freq: Every day | ORAL | 11 refills | Status: DC

## 2019-05-27 MED ORDER — POTASSIUM CHLORIDE ER 20 MEQ PO TBCR: 20.0000 meq | EXTENDED_RELEASE_TABLET | Freq: Every day | ORAL | 1 refills | Status: DC

## 2019-05-27 MED ORDER — ASPIRIN 81 MG PO TBEC: 81.0000 mg | DELAYED_RELEASE_TABLET | Freq: Every day | ORAL | 0 refills | Status: DC

## 2019-05-27 NOTE — Progress Notes (Signed)
SATURATION QUALIFICATIONS: (This note is used to comply with regulatory documentation for home oxygen)  Patient Saturations on Room Air at Rest = 95%  Patient Saturations on Room Air while Ambulating = 92%  Patient Saturations on 2 Liters of oxygen while Ambulating = n/a  Please briefly explain why patient needs home oxygen: Patient did not require oxygen to ambulate.  Celestia Khat, RN

## 2019-05-27 NOTE — Clinical Social Work Note (Signed)
Patient discharging. Orders for [facility] placed. Patient is agreeable to [facility]. Patient reports that she was recently active with [facility] to care for radiation burns.  Patient is active with [facility] RN. Jason at [facility] advised that patient was discharging today.    Jachelle Fluty, Clydene Pugh, LCSW

## 2019-05-27 NOTE — Progress Notes (Deleted)
Physician Discharge Summary  Jacqueline Bird [id number] DOB: June 06, 1950 DOA: 05/21/2019  PCP: Lucia Gaskins, MD  Admit date: 05/21/2019 Discharge date: 05/27/2019  Admitted From: Home Disposition: Home  Recommendations for Outpatient Follow-up:  1. Follow up with PCP in 1-2 weeks 2. Please obtain BMP/CBC in one week 3. Patient has been referred for outpatient cardiology follow-up  Home Health: Home health RN Equipment/Devices:  Discharge Condition: Stable CODE STATUS: Full code Diet recommendation: Heart healthy, carb modified  Brief/Interim Summary: 69 year old female with a history of anal cancer, on chemotherapy and radiation, presented to the hospital with shortness of breath.  She had progressive dyspnea on exertion and orthopnea.  She was found to have decompensated CHF and was admitted for further IV diuresis.  Discharge Diagnoses:  Active Problems:   Anal cancer (HCC)   CHF (congestive heart failure) (HCC)   Acute diastolic CHF (congestive heart failure) (HCC)   Acute respiratory failure with hypoxia (HCC)   Hypokalemia   Essential hypertension   Type 2 diabetes mellitus with other specified complication (HCC)   Anxiety  Acute hypoxemic respiratory failure secondary to new onset acute diastolic CHF decompensation with associated pleural effusions -Required BiPAP on 1/4, but was subsequently titrated down to high flow nasal cannula. -Oxygen was eventually weaned off and she is breathing comfortably on room air.  She is able to ambulate without difficulty. -She was treated with IV Lasix 20 mg twice daily -Since she is now euvolemic, she has been transitioned to oral Lasix. -Patient has high volume urine output with diuretics. -Weight is down approximately 17 pounds since admission. -S/P R sided thoracentesis 1/4 with fluid analysis indicated transudate of process.  Left-sided thoracentesis performed 1/7 -2D echo with LVEF 50-55% and grade 1 diastolic  dysfunction -Discussed with Dr. Delton Coombes who did not feel that chemotherapy was causing heart failure. -Will need follow-up with cardiology regarding further management.   Severe hypokalemia -Replaced, magnesium normal -Monitor on tele  Bradycardia-improving -TSH 1.461 -Holding beta-blockers due to soft blood pressures  Anxiety -Continue xanax and trintellix  Hypertension-soft -continue to hold lisinopril on discharge.  Readdress as an outpatient  Dyslipidemia -statin  DM2 -A1c 5.7% -Home dose of long-acting insulin was reduced.  She is continued on Trulicity.  Blood sugars have been stable.  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 Delton Coombes outpatient  Discharge Instructions  Discharge Instructions    Diet - low sodium heart healthy   Complete by: As directed    Increase activity slowly   Complete by: As directed      Allergies as of 05/27/2019      Reactions   Sulfa Antibiotics Anaphylaxis, Swelling   Per pt, can use creams with sulfa in it      Medication List    STOP taking these medications   docusate sodium 100 MG capsule Commonly known as: Colace   Invokana 300 MG Tabs tablet Generic drug: canagliflozin   lidocaine-prilocaine cream Commonly known as: EMLA   lisinopril 20 MG tablet Commonly known as: ZESTRIL   nabumetone 750 MG tablet Commonly known as: RELAFEN     TAKE these medications   ALPRAZolam 0.5 MG tablet Commonly known as: XANAX Take 1 tablet (0.5 mg total) by mouth 2 (two) times daily as needed for anxiety.   aspirin 81 MG EC tablet Take 1 tablet (81 mg total) by mouth daily. Start taking on: May 28, 2019   furosemide  40 MG tablet Commonly known as: Lasix Take 1 tablet (40 mg total) by mouth daily.   gabapentin 100 MG capsule Commonly known as: NEURONTIN Take 100-200 mg by mouth See admin instructions. Take 200mg  in the  AM and 100mg  in the PM.   HYDROcodone-acetaminophen 10-325 MG tablet Commonly known as: NORCO Take 1 tablet by mouth every 4 (four) hours as needed. What changed:   how much to take  reasons to take this   Lidocaine (Anorectal) 5 % Gel Apply 1 application topically 4 (four) times daily as needed. What changed: additional instructions   Potassium Chloride ER 20 MEQ Tbcr Take 20 mEq by mouth daily.   prochlorperazine 10 MG tablet Commonly known as: COMPAZINE Take 1 tablet (10 mg total) by mouth every 6 (six) hours as needed for nausea or vomiting.   silver sulfADIAZINE 1 % cream Commonly known as: SILVADENE Apply to affected area daily What changed:   how much to take  how to take this  when to take this  additional instructions   simvastatin 40 MG tablet Commonly known as: ZOCOR Take 40 mg by mouth daily.   Tyler Aas FlexTouch 200 UNIT/ML Sopn Generic drug: Insulin Degludec Inject 20 Units into the skin daily before breakfast. What changed: how much to take   Trintellix 10 MG Tabs tablet Generic drug: vortioxetine HBr Take 10 mg by mouth daily.   Trulicity [id number] Sopn Generic drug: Dulaglutide Inject 0.75 mg into the muscle every Sunday.       Allergies  Allergen Reactions  . Sulfa Antibiotics Anaphylaxis and Swelling    Per pt, can use creams with sulfa in it    Consultations:     Procedures/Studies: DG Chest 1 View  Result Date: 05/25/2019 CLINICAL DATA:  Post thoracentesis EXAM: CHEST  1 VIEW COMPARISON:  Expiratory exam 1139 hours compared to 0509 hours FINDINGS: Stable LEFT subclavian Port-A-Cath. Enlargement of cardiac silhouette with vascular congestion. Persistent pulmonary infiltrates. Decreased LEFT pleural effusion post thoracentesis. No pneumothorax. IMPRESSION: No pneumothorax following LEFT thoracentesis. Persistent pulmonary infiltrates. Electronically Signed   By: Lavonia Dana M.D.   On: 05/25/2019 12:06   DG Chest 1  View  Result Date: 05/22/2019 CLINICAL DATA:  BILATERAL pleural effusions, post RIGHT thoracentesis, history of cervical cancer, diabetes mellitus, hypertension EXAM: CHEST  1 VIEW COMPARISON:  05/21/2019 FINDINGS: LEFT subclavian Port-A-Cath with tip projecting over SVC. Enlargement of cardiac silhouette with pulmonary vascular congestion. Crowding of markings and probable mild perihilar edema though accentuated by expiratory technique, question mild failure. Persistent LEFT basilar atelectasis versus consolidation. Small LEFT pleural effusion. No pneumothorax post RIGHT thoracentesis. Bones demineralized. IMPRESSION: No pneumothorax following RIGHT thoracentesis. Enlargement of cardiac silhouette with vascular congestion and question mild pulmonary edema/failure. Electronically Signed   By: Lavonia Dana M.D.   On: 05/22/2019 09:35   CT Angio Chest PE W/Cm &/Or Wo Cm  Result Date: 05/21/2019 CLINICAL DATA:  History of cervical carcinoma with shortness of breath and hypoxia EXAM: CT ANGIOGRAPHY CHEST WITH CONTRAST TECHNIQUE: Multidetector CT imaging of the chest was performed using the standard protocol during bolus administration of intravenous contrast. Multiplanar CT image reconstructions and MIPs were obtained to evaluate the vascular anatomy. CONTRAST:  132mL OMNIPAQUE IOHEXOL 350 MG/ML SOLN COMPARISON:  Chest x-ray from earlier in the same day. FINDINGS: Cardiovascular: Thoracic aorta demonstrates atherosclerotic calcifications without aneurysmal dilatation or dissection. No significant cardiac enlargement is seen. No pericardial effusion is noted. The pulmonary artery shows a normal branching pattern without focal  filling defect to suggest pulmonary embolism. Mediastinum/Nodes: Thoracic inlet is within normal limits. No sizable hilar or mediastinal adenopathy is noted. The esophagus as visualized is within normal limits. Lungs/Pleura: Large bilateral pleural effusions are noted right greater than left with  associated lower lobe atelectasis. Diffuse edema is identified with patchy ground-glass changes related to the underlying edema given the negative COVID-19 test. Upper Abdomen: Visualized upper abdomen is within normal limits. Musculoskeletal: Degenerative changes of the thoracic spine are seen. No acute rib abnormality is seen. Review of the MIP images confirms the above findings. IMPRESSION: Changes consistent with CHF with edema and large bilateral pleural effusions. Associated atelectatic changes are noted. Aortic Atherosclerosis (ICD10-I70.0). Electronically Signed   By: Inez Catalina M.D.   On: 05/21/2019 18:30   DG CHEST PORT 1 VIEW  Result Date: 05/25/2019 CLINICAL DATA:  Shortness of breath. EXAM: PORTABLE CHEST 1 VIEW COMPARISON:  05/21/2019. FINDINGS: PowerPort catheter noted with tip over SVC. Heart size stable. Diffuse progressive bilateral pulmonary infiltrates/edema. Low lung volumes with bibasilar atelectasis. Moderate bilateral pleural effusions. No pneumothorax. IMPRESSION: 1. Diffuse progressive bilateral pulmonary infiltrates/edema. Moderate bilateral pleural effusions. Findings suggest CHF. Bilateral pneumonia cannot be excluded. 2.  Low lung volumes with bibasilar atelectasis. Electronically Signed   By: Marcello Moores  Register   On: 05/25/2019 06:32   DG Chest Port 1 View  Result Date: 05/21/2019 CLINICAL DATA:  Shortness of breath and hypoxia, history of cervical carcinoma EXAM: PORTABLE CHEST 1 VIEW COMPARISON:  02/08/19 FINDINGS: Cardiac shadows within normal limits. Left chest wall port is again seen and stable. The lungs are well aerated with central vascular congestion and mild interstitial edema. No focal infiltrate is seen. IMPRESSION: Changes of vascular congestion and interstitial edema. Electronically Signed   By: Inez Catalina M.D.   On: 05/21/2019 15:49   ECHOCARDIOGRAM COMPLETE  Result Date: 05/22/2019   ECHOCARDIOGRAM REPORT   Patient Name:   JONNAE MURAI Date of Exam:  05/22/2019 Medical Rec #:  [id number]       Height:       64.0 in Accession #:    [id number]      Weight:       188.0 lb Date of Birth:  August 12, 1950       BSA:          1.91 m Patient Age:    61 years        BP:           107/89 mmHg Patient Gender: F               HR:           85 bpm. Exam Location:  [facility] Procedure: 2D Echo, Cardiac Doppler and Color Doppler Indications:    CHF-Acute Diastolic [id number] / [id number]  History:        Patient has no prior history of Echocardiogram examinations.                 CHF; Risk Factors:Diabetes and Hypertension. Cancer,.  Sonographer:    Alvino Chapel RCS Referring Phys: [id number] ASIA B [place]  1. Left ventricular ejection fraction, by visual estimation, is 50 to 55%. The left ventricle has low normal function. There is no left ventricular hypertrophy.  2. Basal inferolateral segment and basal inferior segment are abnormal.  3. Left ventricular diastolic parameters are consistent with Grade I diastolic dysfunction (impaired relaxation).  4. The left ventricle demonstrates regional wall motion abnormalities.  5. Global right ventricle has normal  systolic function.The right ventricular size is normal. No increase in right ventricular wall thickness.  6. Left atrial size was moderately dilated.  7. Right atrial size was normal.  8. The mitral valve is grossly normal. Mild mitral valve regurgitation.  9. The tricuspid valve is grossly normal. 10. The aortic valve is tricuspid. Aortic valve regurgitation is not visualized. 11. The pulmonic valve was grossly normal. Pulmonic valve regurgitation is trivial. 12. Mildly elevated pulmonary artery systolic pressure. 13. The tricuspid regurgitant velocity is 2.30 m/s, and with an assumed right atrial pressure of 15 mmHg, the estimated right ventricular systolic pressure is mildly elevated at 36.2 mmHg. 14. The inferior vena cava is dilated in size with <50% respiratory variability, suggesting right atrial pressure of 15  mmHg. FINDINGS  Left Ventricle: Left ventricular ejection fraction, by visual estimation, is 50 to 55%. The left ventricle has low normal function. The left ventricle demonstrates regional wall motion abnormalities. The left ventricular internal cavity size was the left ventricle is normal in size. There is no left ventricular hypertrophy. Left ventricular diastolic parameters are consistent with Grade I diastolic dysfunction (impaired relaxation).  LV Wall Scoring: The basal inferolateral segment and basal inferior segment are akinetic. All remaining scored segments are normal. Right Ventricle: The right ventricular size is normal. No increase in right ventricular wall thickness. Global RV systolic function is has normal systolic function. The tricuspid regurgitant velocity is 2.30 m/s, and with an assumed right atrial pressure  of 15 mmHg, the estimated right ventricular systolic pressure is mildly elevated at 36.2 mmHg. Left Atrium: Left atrial size was moderately dilated. Right Atrium: Right atrial size was normal in size Pericardium: There is no evidence of pericardial effusion. Mitral Valve: The mitral valve is grossly normal. Mild mitral valve regurgitation. Tricuspid Valve: The tricuspid valve is grossly normal. Tricuspid valve regurgitation is mild. Aortic Valve: The aortic valve is tricuspid. Aortic valve regurgitation is not visualized. Mild aortic valve annular calcification. Pulmonic Valve: The pulmonic valve was grossly normal. Pulmonic valve regurgitation is trivial. Pulmonic regurgitation is trivial. Aorta: The aortic root is normal in size and structure. Venous: The inferior vena cava is dilated in size with less than 50% respiratory variability, suggesting right atrial pressure of 15 mmHg. IAS/Shunts: No atrial level shunt detected by color flow Doppler.  LEFT VENTRICLE PLAX 2D LVIDd:         5.34 cm       Diastology LVIDs:         3.94 cm       LV e' lateral:   7.18 cm/s LV PW:         0.94 cm        LV E/e' lateral: 13.4 LV IVS:        0.86 cm       LV e' medial:    6.74 cm/s LVOT diam:     2.00 cm       LV E/e' medial:  14.2 LV SV:         70 ml LV SV Index:   35.19 LVOT Area:     3.14 cm  LV Volumes (MOD) LV area d, A2C:    31.30 cm LV area d, A4C:    30.90 cm LV area s, A2C:    19.40 cm LV area s, A4C:    20.50 cm LV major d, A2C:   7.61 cm LV major d, A4C:   8.04 cm LV major s, A2C:   6.11 cm LV  major s, A4C:   6.95 cm LV vol d, MOD A2C: 108.0 ml LV vol d, MOD A4C: 97.4 ml LV vol s, MOD A2C: 53.9 ml LV vol s, MOD A4C: 52.3 ml LV SV MOD A2C:     54.1 ml LV SV MOD A4C:     97.4 ml LV SV MOD BP:      48.8 ml RIGHT VENTRICLE RV S prime:     13.80 cm/s TAPSE (M-mode): 1.8 cm LEFT ATRIUM             Index       RIGHT ATRIUM           Index LA diam:        4.10 cm 2.15 cm/m  RA Area:     16.60 cm LA Vol (A2C):   85.8 ml 45.03 ml/m RA Volume:   48.70 ml  25.56 ml/m LA Vol (A4C):   77.2 ml 40.51 ml/m LA Biplane Vol: 88.7 ml 46.55 ml/m  AORTIC VALVE LVOT Vmax:   100.37 cm/s LVOT Vmean:  71.633 cm/s LVOT VTI:    0.228 m  AORTA Ao Root diam: 3.00 cm MITRAL VALVE                         TRICUSPID VALVE MV Area (PHT): 4.60 cm              TR Peak grad:   21.2 mmHg MV PHT:        47.85 msec            TR Vmax:        230.00 cm/s MV Decel Time: 165 msec MV E velocity: 96.00 cm/s  103 cm/s  SHUNTS MV A velocity: 104.00 cm/s 70.3 cm/s Systemic VTI:  0.23 m MV E/A ratio:  0.92        1.5       Systemic Diam: 2.00 cm  Rozann Lesches MD Electronically signed by Rozann Lesches MD Signature Date/Time: 05/22/2019/2:36:48 PM    Final    US THORACENTESIS ASP PLEURAL SPACE W/IMG GUIDE  Result Date: 05/25/2019 INDICATION: SOB Bilat pleural effusions Right thoracentesis 1/4: 530 cc Left thoracentesis today EXAM: ULTRASOUND GUIDED Left THORACENTESIS MEDICATIONS: 10 cc 1% lidocaine COMPLICATIONS: None immediate PROCEDURE: An ultrasound guided thoracentesis was thoroughly discussed with the patient and questions answered.  The benefits, risks, alternatives and complications were also discussed. The patient understands and wishes to proceed with the procedure. Written consent was obtained. Ultrasound was performed to localize and mark an adequate pocket of fluid in the left chest. The area was then prepped and draped in the normal sterile fashion. 1% Lidocaine was used for local anesthesia. Under ultrasound guidance a 19 gauge 7 cm Yueh catheter was introduced. Thoracentesis was performed. The catheter was removed and a dressing applied. FINDINGS: A total of approximately 600 cc of yellow fluid was removed. Samples were sent to the laboratory as requested by the clinical team. IMPRESSION: Successful ultrasound guided left thoracentesis yielding 600 cc of pleural fluid. Read by Lavonia Drafts [facility] Electronically Signed   By: Lavonia Dana M.D.   On: 05/25/2019 11:48   US THORACENTESIS ASP PLEURAL SPACE W/IMG GUIDE  Result Date: 05/22/2019 INDICATION: BILATERAL pleural effusions, history hypertension, diabetes mellitus, cervical cancer post radiation therapy EXAM: ULTRASOUND GUIDED DIAGNOSTIC AND THERAPEUTIC RIGHT THORACENTESIS MEDICATIONS: None COMPLICATIONS: None immediate PROCEDURE: Procedure, benefits, and risks of procedure were discussed with patient. Written informed consent for procedure  was obtained. Time out protocol followed. Pleural effusion localized by ultrasound at the posterior RIGHT hemithorax. Skin prepped and draped in usual sterile fashion. Skin and soft tissues anesthetized with 10 mL of 1% lidocaine. 8 French thoracentesis catheter placed into the RIGHT pleural space. 530 mL of yellow RIGHT pleural fluid aspirated by syringe pump. Procedure tolerated well by patient without immediate complication. FINDINGS: A total of approximately 530 mL of RIGHT pleural fluid was removed. Samples were sent to the laboratory as requested by the clinical team. IMPRESSION: Successful ultrasound guided RIGHT thoracentesis yielding  530 mL of pleural fluid. Electronically Signed   By: Lavonia Dana M.D.   On: 05/22/2019 09:47       Subjective: Feels her breathing is improving.  Able to ambulate on room air without significant shortness of breath.  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     Microbiology: Recent Results (from the past 240 hour(s))  Respiratory Panel by RT PCR (Flu A&B, Covid) - Nasopharyngeal Swab     Status: None   Collection Time: 05/21/19  3:44 PM   Specimen: Nasopharyngeal Swab  Result Value Ref Range Status   SARS Coronavirus 2 by RT PCR NEGATIVE NEGATIVE Final    Comment: (NOTE) SARS-CoV-2 target nucleic acids are NOT DETECTED. The SARS-CoV-2 RNA is generally detectable in upper respiratoy specimens during the acute phase of infection. The lowest concentration of SARS-CoV-2 viral copies this assay can detect is 131 copies/mL. A negative result does not preclude SARS-Cov-2 infection and should not be used as the sole basis for treatment or other patient management decisions. A negative result may occur with  improper specimen collection/handling, submission of specimen other than nasopharyngeal swab, presence of viral mutation(s) within the areas targeted by this assay, and inadequate number of viral copies (<131 copies/mL). A negative result must be  combined with clinical observations, patient history, and epidemiological information. The expected result is Negative. Fact Sheet for Patients:  PinkCheek.be Fact Sheet for Healthcare Providers:  GravelBags.it This test is not yet ap proved or cleared by the [place] FDA and  has been authorized for detection and/or diagnosis of SARS-CoV-2 by FDA under an Emergency Use Authorization (EUA). This EUA will remain  in effect (meaning this test can be used) for the duration of the COVID-19 declaration under Section 564(b)(1) of the Act, 21 U.S.C. section 360bbb-3(b)(1), unless the authorization is terminated or revoked sooner.    Influenza A by PCR NEGATIVE NEGATIVE Final   Influenza B by PCR NEGATIVE NEGATIVE Final    Comment: (NOTE) The Xpert Xpress SARS-CoV-2/FLU/RSV assay is intended as an aid in  the diagnosis of influenza from Nasopharyngeal swab specimens and  should not be used as a sole basis for treatment. Nasal washings  and  aspirates are unacceptable for Xpert Xpress SARS-CoV-2/FLU/RSV  testing. Fact Sheet for Patients: PinkCheek.be Fact Sheet for Healthcare Providers: GravelBags.it This test is not yet approved or cleared by the [place] FDA and  has been authorized for detection and/or diagnosis of SARS-CoV-2 by  FDA under an Emergency Use Authorization (EUA). This EUA will remain  in effect (meaning this test can be used) for the duration of the  Covid-19 declaration under Section 564(b)(1) of the Act, 21  U.S.C. section 360bbb-3(b)(1), unless the authorization is  terminated or revoked. Performed at [facility], [address], [place], [place] [address]   Culture, body fluid-bottle     Status: None   Collection Time: 05/22/19  9:12 AM   Specimen: Pleura  Result Value Ref Range Status   Specimen Description PLEURAL  Final   Special Requests  BOTTLES DRAWN AEROBIC AND ANAEROBIC 10CC  Final   Culture   Final    NO GROWTH 5 DAYS Performed at [facility], [address], [place], [place] [address]    Report Status 05/27/2019 FINAL  Final  Gram stain     Status: None   Collection Time: 05/22/19  9:13 AM   Specimen: Pleura; Body Fluid  Result Value Ref Range Status   Specimen Description PLEURAL  Final   Special Requests NONE  Final   Gram Stain   Final    NO ORGANISMS SEEN WBC PRESENT, PREDOMINANTLY MONONUCLEAR CYTOSPIN SMEAR Performed at [facility], [address], [place], [place] [address]    Report Status 05/22/2019 FINAL  Final  MRSA PCR Screening     Status: None   Collection Time: 05/22/19  2:55 PM   Specimen: Nasal Mucosa; Nasopharyngeal  Result Value Ref Range Status   MRSA by PCR NEGATIVE NEGATIVE Final    Comment:        The GeneXpert MRSA Assay (FDA approved for NASAL specimens only), is one component of a comprehensive MRSA colonization surveillance program. It is not intended to diagnose MRSA infection nor to guide or monitor treatment for MRSA infections. Performed at [facility], [address], [place], [place] [address]      Labs: BNP (last 3 results) Recent Labs    05/21/19 1455  BNP [id number]*   Basic Metabolic Panel: Recent Labs  Lab 05/21/19 2004 05/22/19 0425 05/23/19 0755 05/24/19 0414 05/25/19 0446 05/26/19 0441 05/27/19 0513  NA  --  142 139 137 135 135 136  K  --  2.7* 3.6 3.5 4.5 3.7 4.7  CL  --  104 104 99 101 99 98  CO2  --  27 30 29 26 27 31   GLUCOSE  --  42* 84 96 110* 112* 138*  BUN  --  18 13 15 14 12 21   CREATININE  --  0.53 0.46 0.56 0.58 0.54 0.72  CALCIUM  --  8.5* 8.0* 8.2* 8.2* 8.5* 8.6*  MG 2.0 2.0 1.8  --   --   --   --    Liver Function Tests: Recent Labs  Lab 05/22/19 0425  AST 20  ALT 13  ALKPHOS 42  BILITOT 0.7  PROT 6.4*  ALBUMIN 2.4*   No results for input(s): LIPASE, AMYLASE in the last 168 hours. No results for input(s): AMMONIA in the  last 168 hours. CBC: Recent Labs  Lab 05/21/19 1455 05/22/19 0425 05/23/19 0755  WBC 6.3 6.5 4.4  NEUTROABS 4.3 4.0  --   HGB 10.8* 11.0* 9.9*  HCT 35.5* 36.6  32.8*  MCV 102.3* 100.5* 101.2*  PLT 377 373 280   Cardiac Enzymes: No results for input(s): CKTOTAL, CKMB, CKMBINDEX, TROPONINI in the last 168 hours. BNP: Invalid input(s): POCBNP CBG: Recent Labs  Lab 05/26/19 1110 05/26/19 1647 05/26/19 2111 05/27/19 0756 05/27/19 1147  GLUCAP 122* 120* 162* 131* 126*   D-Dimer No results for input(s): DDIMER in the last 72 hours. Hgb A1c No results for input(s): HGBA1C in the last 72 hours. Lipid Profile No results for input(s): CHOL, HDL, LDLCALC, TRIG, CHOLHDL, LDLDIRECT in the last 72 hours. Thyroid function studies No results for input(s): TSH, T4TOTAL, T3FREE, THYROIDAB in the last 72 hours.  Invalid input(s): FREET3 Anemia work up No results for input(s): VITAMINB12, FOLATE, FERRITIN, TIBC, IRON, RETICCTPCT in the last 72 hours. Urinalysis No results found for: COLORURINE, APPEARANCEUR, [place], [place], [place], [place], [place], [place], PROTEINUR, UROBILINOGEN, NITRITE, LEUKOCYTESUR Sepsis [facility] Invalid input(s): PROCALCITONIN,  WBC,  LACTICIDVEN Microbiology Recent Results (from the past 240 hour(s))  Respiratory Panel by RT PCR (Flu A&B, Covid) - Nasopharyngeal Swab     Status: None   Collection Time: 05/21/19  3:44 PM   Specimen: Nasopharyngeal Swab  Result Value Ref Range Status   SARS Coronavirus 2 by RT PCR NEGATIVE NEGATIVE Final    Comment: (NOTE) SARS-CoV-2 target nucleic acids are NOT DETECTED. The SARS-CoV-2 RNA is generally detectable in upper respiratoy specimens during the acute phase of infection. The lowest concentration of SARS-CoV-2 viral copies this assay can detect is 131 copies/mL. A negative result does not preclude SARS-Cov-2 infection and should not be used as the sole basis for treatment or other patient management decisions. A  negative result may occur with  improper specimen collection/handling, submission of specimen other than nasopharyngeal swab, presence of viral mutation(s) within the areas targeted by this assay, and inadequate number of viral copies (<131 copies/mL). A negative result must be combined with clinical observations, patient history, and epidemiological information. The expected result is Negative. Fact Sheet for Patients:  PinkCheek.be Fact Sheet for Healthcare Providers:  GravelBags.it This test is not yet ap proved or cleared by the [place] FDA and  has been authorized for detection and/or diagnosis of SARS-CoV-2 by FDA under an Emergency Use Authorization (EUA). This EUA will remain  in effect (meaning this test can be used) for the duration of the COVID-19 declaration under Section 564(b)(1) of the Act, 21 U.S.C. section 360bbb-3(b)(1), unless the authorization is terminated or revoked sooner.    Influenza A by PCR NEGATIVE NEGATIVE Final   Influenza B by PCR NEGATIVE NEGATIVE Final    Comment: (NOTE) The Xpert Xpress SARS-CoV-2/FLU/RSV assay is intended as an aid in  the diagnosis of influenza from Nasopharyngeal swab specimens and  should not be used as a sole basis for treatment. Nasal washings and  aspirates are unacceptable for Xpert Xpress SARS-CoV-2/FLU/RSV  testing. Fact Sheet for Patients: PinkCheek.be Fact Sheet for Healthcare Providers: GravelBags.it This test is not yet approved or cleared by the [place] FDA and  has been authorized for detection and/or diagnosis of SARS-CoV-2 by  FDA under an Emergency Use Authorization (EUA). This EUA will remain  in effect (meaning this test can be used) for the duration of the  Covid-19 declaration under Section 564(b)(1) of the Act, 21  U.S.C. section 360bbb-3(b)(1), unless the authorization is   terminated or revoked. Performed at [facility], [address] [place], [place] [address]   Culture, body fluid-bottle     Status: None  Collection Time: 05/22/19  9:12 AM   Specimen: Pleura  Result Value Ref Range Status   Specimen Description PLEURAL  Final   Special Requests BOTTLES DRAWN AEROBIC AND ANAEROBIC 10CC  Final   Culture   Final    NO GROWTH 5 DAYS Performed at [facility], [address] [place], [place] [address]    Report Status 05/27/2019 FINAL  Final  Gram stain     Status: None   Collection Time: 05/22/19  9:13 AM   Specimen: Pleura; Body Fluid  Result Value Ref Range Status   Specimen Description PLEURAL  Final   Special Requests NONE  Final   Gram Stain   Final    NO ORGANISMS SEEN WBC PRESENT, PREDOMINANTLY MONONUCLEAR CYTOSPIN SMEAR Performed at [facility], [address], [place], [place] [address]    Report Status 05/22/2019 FINAL  Final  MRSA PCR Screening     Status: None   Collection Time: 05/22/19  2:55 PM   Specimen: Nasal Mucosa; Nasopharyngeal  Result Value Ref Range Status   MRSA by PCR NEGATIVE NEGATIVE Final    Comment:        The GeneXpert MRSA Assay (FDA approved for NASAL specimens only), is one component of a comprehensive MRSA colonization surveillance program. It is not intended to diagnose MRSA infection nor to guide or monitor treatment for MRSA infections. Performed at [facility], [address], [place], [place] [address]      Time coordinating discharge: 4mins  SIGNED:   Kathie Dike, MD  [facility] 05/27/2019, 8:34 PM   If 7PM-7AM, please contact night-coverage [email or web address]

## 2019-05-27 NOTE — Progress Notes (Signed)
Patient is to be discharged home and in stable condition. IV and telemetry removed, WNL. Patient given discharge instructions and verbalized understanding. Patient to be escorted out by staff via wheelchair.  Celestia Khat, RN

## 2019-05-29 ENCOUNTER — Other Ambulatory Visit (HOSPITAL_COMMUNITY): Payer: Self-pay | Admitting: *Deleted

## 2019-05-29 NOTE — Discharge Summary (Signed)
Physician Discharge Summary  Zona Michalik [id number] DOB: December 07, 1950 DOA: 05/21/2019  PCP: Lucia Gaskins, MD  Admit date: 05/21/2019 Discharge date: 05/28/2019  Admitted From: Home Disposition: Home  Recommendations for Outpatient Follow-up:  1. Follow up with PCP in 1-2 weeks 2. Please obtain BMP/CBC in one week 3. Patient has been referred for outpatient cardiology follow-up  Home Health: Home health RN Equipment/Devices:  Discharge Condition: Stable CODE STATUS: Full code Diet recommendation: Heart healthy, carb modified  Brief/Interim Summary: 69 year old female with a history of anal cancer, on chemotherapy and radiation, presented to the hospital with shortness of breath.  She had progressive dyspnea on exertion and orthopnea.  She was found to have decompensated CHF and was admitted for further IV diuresis.  Discharge Diagnoses:  Active Problems:   Anal cancer (HCC)   CHF (congestive heart failure) (HCC)   Acute diastolic CHF (congestive heart failure) (HCC)   Acute respiratory failure with hypoxia (HCC)   Hypokalemia   Essential hypertension   Type 2 diabetes mellitus with other specified complication (HCC)   Anxiety   Pleural effusion, right  Acute hypoxemic respiratory failure secondary to new onset acute diastolic CHF decompensation with associated pleural effusions -Required BiPAP on 1/4, but was subsequently titrated down to high flow nasal cannula. -Oxygen was eventually weaned off and she is breathing comfortably on room air.  She is able to ambulate without difficulty. -She was treated with IV Lasix 20 mg twice daily -Since she is now euvolemic, she has been transitioned to oral Lasix. -Patient has high volume urine output with diuretics. -Weight is down approximately 17 pounds since admission. -S/P R sided thoracentesis 1/4 with fluid analysis indicated transudate of process.  Left-sided thoracentesis performed 1/7 -2D echo with LVEF 50-55% and grade  1 diastolic dysfunction -Discussed with Dr. Delton Coombes who did not feel that chemotherapy was causing heart failure. -Will need follow-up with cardiology regarding further management.   Severe hypokalemia -Replaced, magnesium normal -Monitor on tele  Bradycardia-improving -TSH 1.461 -Holding beta-blockers due to soft blood pressures  Anxiety -Continue xanax and trintellix  Hypertension-soft -continue to hold lisinopril on discharge.  Readdress as an outpatient  Dyslipidemia -statin  DM2 -A1c 5.7% -Home dose of long-acting insulin was reduced.  She is continued on Trulicity.  Blood sugars have been stable.  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 Delton Coombes outpatient  Discharge Instructions  Discharge Instructions    Diet - low sodium heart healthy   Complete by: As directed    Increase activity slowly   Complete by: As directed      Allergies as of 05/27/2019      Reactions   Sulfa Antibiotics Anaphylaxis, Swelling   Per pt, can use creams with sulfa in it      Medication List    STOP taking these medications   docusate sodium 100 MG capsule Commonly known as: Colace   Invokana 300 MG Tabs tablet Generic drug: canagliflozin   lidocaine-prilocaine cream Commonly known as: EMLA   lisinopril 20 MG tablet Commonly known as: ZESTRIL   nabumetone 750 MG tablet Commonly known as: RELAFEN     TAKE these medications   ALPRAZolam 0.5 MG tablet Commonly known as: XANAX Take 1 tablet (0.5 mg total) by mouth 2 (two) times daily as needed for anxiety.   aspirin 81 MG EC tablet Take 1 tablet (81 mg total) by mouth daily.   furosemide 40  MG tablet Commonly known as: Lasix Take 1 tablet (40 mg total) by mouth daily.   gabapentin 100 MG capsule Commonly known as: NEURONTIN Take 100-200 mg by mouth See admin instructions. Take 200mg  in the AM and 100mg  in the  PM.   HYDROcodone-acetaminophen 10-325 MG tablet Commonly known as: NORCO Take 1 tablet by mouth every 4 (four) hours as needed. What changed:   how much to take  reasons to take this   Lidocaine (Anorectal) 5 % Gel Apply 1 application topically 4 (four) times daily as needed. What changed: additional instructions   Potassium Chloride ER 20 MEQ Tbcr Take 20 mEq by mouth daily.   prochlorperazine 10 MG tablet Commonly known as: COMPAZINE Take 1 tablet (10 mg total) by mouth every 6 (six) hours as needed for nausea or vomiting.   silver sulfADIAZINE 1 % cream Commonly known as: SILVADENE Apply to affected area daily What changed:   how much to take  how to take this  when to take this  additional instructions   simvastatin 40 MG tablet Commonly known as: ZOCOR Take 40 mg by mouth daily.   Tyler Aas FlexTouch 200 UNIT/ML Sopn Generic drug: Insulin Degludec Inject 20 Units into the skin daily before breakfast. What changed: how much to take   Trintellix 10 MG Tabs tablet Generic drug: vortioxetine HBr Take 10 mg by mouth daily.   Trulicity [id number] Sopn Generic drug: Dulaglutide Inject 0.75 mg into the muscle every Sunday.       Allergies  Allergen Reactions  . Sulfa Antibiotics Anaphylaxis and Swelling    Per pt, can use creams with sulfa in it    Consultations:     Procedures/Studies: DG Chest 1 View  Result Date: 05/25/2019 CLINICAL DATA:  Post thoracentesis EXAM: CHEST  1 VIEW COMPARISON:  Expiratory exam 1139 hours compared to 0509 hours FINDINGS: Stable LEFT subclavian Port-A-Cath. Enlargement of cardiac silhouette with vascular congestion. Persistent pulmonary infiltrates. Decreased LEFT pleural effusion post thoracentesis. No pneumothorax. IMPRESSION: No pneumothorax following LEFT thoracentesis. Persistent pulmonary infiltrates. Electronically Signed   By: Lavonia Dana M.D.   On: 05/25/2019 12:06   DG Chest 1 View  Result Date:  05/22/2019 CLINICAL DATA:  BILATERAL pleural effusions, post RIGHT thoracentesis, history of cervical cancer, diabetes mellitus, hypertension EXAM: CHEST  1 VIEW COMPARISON:  05/21/2019 FINDINGS: LEFT subclavian Port-A-Cath with tip projecting over SVC. Enlargement of cardiac silhouette with pulmonary vascular congestion. Crowding of markings and probable mild perihilar edema though accentuated by expiratory technique, question mild failure. Persistent LEFT basilar atelectasis versus consolidation. Small LEFT pleural effusion. No pneumothorax post RIGHT thoracentesis. Bones demineralized. IMPRESSION: No pneumothorax following RIGHT thoracentesis. Enlargement of cardiac silhouette with vascular congestion and question mild pulmonary edema/failure. Electronically Signed   By: Lavonia Dana M.D.   On: 05/22/2019 09:35   CT Angio Chest PE W/Cm &/Or Wo Cm  Result Date: 05/21/2019 CLINICAL DATA:  History of cervical carcinoma with shortness of breath and hypoxia EXAM: CT ANGIOGRAPHY CHEST WITH CONTRAST TECHNIQUE: Multidetector CT imaging of the chest was performed using the standard protocol during bolus administration of intravenous contrast. Multiplanar CT image reconstructions and MIPs were obtained to evaluate the vascular anatomy. CONTRAST:  176mL OMNIPAQUE IOHEXOL 350 MG/ML SOLN COMPARISON:  Chest x-ray from earlier in the same day. FINDINGS: Cardiovascular: Thoracic aorta demonstrates atherosclerotic calcifications without aneurysmal dilatation or dissection. No significant cardiac enlargement is seen. No pericardial effusion is noted. The pulmonary artery shows a normal branching pattern without focal filling  defect to suggest pulmonary embolism. Mediastinum/Nodes: Thoracic inlet is within normal limits. No sizable hilar or mediastinal adenopathy is noted. The esophagus as visualized is within normal limits. Lungs/Pleura: Large bilateral pleural effusions are noted right greater than left with associated lower  lobe atelectasis. Diffuse edema is identified with patchy ground-glass changes related to the underlying edema given the negative COVID-19 test. Upper Abdomen: Visualized upper abdomen is within normal limits. Musculoskeletal: Degenerative changes of the thoracic spine are seen. No acute rib abnormality is seen. Review of the MIP images confirms the above findings. IMPRESSION: Changes consistent with CHF with edema and large bilateral pleural effusions. Associated atelectatic changes are noted. Aortic Atherosclerosis (ICD10-I70.0). Electronically Signed   By: Inez Catalina M.D.   On: 05/21/2019 18:30   DG CHEST PORT 1 VIEW  Result Date: 05/25/2019 CLINICAL DATA:  Shortness of breath. EXAM: PORTABLE CHEST 1 VIEW COMPARISON:  05/21/2019. FINDINGS: PowerPort catheter noted with tip over SVC. Heart size stable. Diffuse progressive bilateral pulmonary infiltrates/edema. Low lung volumes with bibasilar atelectasis. Moderate bilateral pleural effusions. No pneumothorax. IMPRESSION: 1. Diffuse progressive bilateral pulmonary infiltrates/edema. Moderate bilateral pleural effusions. Findings suggest CHF. Bilateral pneumonia cannot be excluded. 2.  Low lung volumes with bibasilar atelectasis. Electronically Signed   By: Marcello Moores  Register   On: 05/25/2019 06:32   DG Chest Port 1 View  Result Date: 05/21/2019 CLINICAL DATA:  Shortness of breath and hypoxia, history of cervical carcinoma EXAM: PORTABLE CHEST 1 VIEW COMPARISON:  02/08/19 FINDINGS: Cardiac shadows within normal limits. Left chest wall port is again seen and stable. The lungs are well aerated with central vascular congestion and mild interstitial edema. No focal infiltrate is seen. IMPRESSION: Changes of vascular congestion and interstitial edema. Electronically Signed   By: Inez Catalina M.D.   On: 05/21/2019 15:49   ECHOCARDIOGRAM COMPLETE  Result Date: 05/22/2019   ECHOCARDIOGRAM REPORT   Patient Name:   MYLIAH SPROWL Date of Exam: 05/22/2019 Medical Rec #:   [id number]       Height:       64.0 in Accession #:    [id number]      Weight:       188.0 lb Date of Birth:  25-Jul-1950       BSA:          1.91 m Patient Age:    43 years        BP:           107/89 mmHg Patient Gender: F               HR:           85 bpm. Exam Location:  [facility] Procedure: 2D Echo, Cardiac Doppler and Color Doppler Indications:    CHF-Acute Diastolic [id number] / [id number]  History:        Patient has no prior history of Echocardiogram examinations.                 CHF; Risk Factors:Diabetes and Hypertension. Cancer,.  Sonographer:    Alvino Chapel RCS Referring Phys: [id number] ASIA B Daggett  1. Left ventricular ejection fraction, by visual estimation, is 50 to 55%. The left ventricle has low normal function. There is no left ventricular hypertrophy.  2. Basal inferolateral segment and basal inferior segment are abnormal.  3. Left ventricular diastolic parameters are consistent with Grade I diastolic dysfunction (impaired relaxation).  4. The left ventricle demonstrates regional wall motion abnormalities.  5. Global right ventricle has normal systolic  function.The right ventricular size is normal. No increase in right ventricular wall thickness.  6. Left atrial size was moderately dilated.  7. Right atrial size was normal.  8. The mitral valve is grossly normal. Mild mitral valve regurgitation.  9. The tricuspid valve is grossly normal. 10. The aortic valve is tricuspid. Aortic valve regurgitation is not visualized. 11. The pulmonic valve was grossly normal. Pulmonic valve regurgitation is trivial. 12. Mildly elevated pulmonary artery systolic pressure. 13. The tricuspid regurgitant velocity is 2.30 m/s, and with an assumed right atrial pressure of 15 mmHg, the estimated right ventricular systolic pressure is mildly elevated at 36.2 mmHg. 14. The inferior vena cava is dilated in size with <50% respiratory variability, suggesting right atrial pressure of 15 mmHg. FINDINGS  Left  Ventricle: Left ventricular ejection fraction, by visual estimation, is 50 to 55%. The left ventricle has low normal function. The left ventricle demonstrates regional wall motion abnormalities. The left ventricular internal cavity size was the left ventricle is normal in size. There is no left ventricular hypertrophy. Left ventricular diastolic parameters are consistent with Grade I diastolic dysfunction (impaired relaxation).  LV Wall Scoring: The basal inferolateral segment and basal inferior segment are akinetic. All remaining scored segments are normal. Right Ventricle: The right ventricular size is normal. No increase in right ventricular wall thickness. Global RV systolic function is has normal systolic function. The tricuspid regurgitant velocity is 2.30 m/s, and with an assumed right atrial pressure  of 15 mmHg, the estimated right ventricular systolic pressure is mildly elevated at 36.2 mmHg. Left Atrium: Left atrial size was moderately dilated. Right Atrium: Right atrial size was normal in size Pericardium: There is no evidence of pericardial effusion. Mitral Valve: The mitral valve is grossly normal. Mild mitral valve regurgitation. Tricuspid Valve: The tricuspid valve is grossly normal. Tricuspid valve regurgitation is mild. Aortic Valve: The aortic valve is tricuspid. Aortic valve regurgitation is not visualized. Mild aortic valve annular calcification. Pulmonic Valve: The pulmonic valve was grossly normal. Pulmonic valve regurgitation is trivial. Pulmonic regurgitation is trivial. Aorta: The aortic root is normal in size and structure. Venous: The inferior vena cava is dilated in size with less than 50% respiratory variability, suggesting right atrial pressure of 15 mmHg. IAS/Shunts: No atrial level shunt detected by color flow Doppler.  LEFT VENTRICLE PLAX 2D LVIDd:         5.34 cm       Diastology LVIDs:         3.94 cm       LV e' lateral:   7.18 cm/s LV PW:         0.94 cm       LV E/e' lateral:  13.4 LV IVS:        0.86 cm       LV e' medial:    6.74 cm/s LVOT diam:     2.00 cm       LV E/e' medial:  14.2 LV SV:         70 ml LV SV Index:   35.19 LVOT Area:     3.14 cm  LV Volumes (MOD) LV area d, A2C:    31.30 cm LV area d, A4C:    30.90 cm LV area s, A2C:    19.40 cm LV area s, A4C:    20.50 cm LV major d, A2C:   7.61 cm LV major d, A4C:   8.04 cm LV major s, A2C:   6.11 cm LV major  s, A4C:   6.95 cm LV vol d, MOD A2C: 108.0 ml LV vol d, MOD A4C: 97.4 ml LV vol s, MOD A2C: 53.9 ml LV vol s, MOD A4C: 52.3 ml LV SV MOD A2C:     54.1 ml LV SV MOD A4C:     97.4 ml LV SV MOD BP:      48.8 ml RIGHT VENTRICLE RV S prime:     13.80 cm/s TAPSE (M-mode): 1.8 cm LEFT ATRIUM             Index       RIGHT ATRIUM           Index LA diam:        4.10 cm 2.15 cm/m  RA Area:     16.60 cm LA Vol (A2C):   85.8 ml 45.03 ml/m RA Volume:   48.70 ml  25.56 ml/m LA Vol (A4C):   77.2 ml 40.51 ml/m LA Biplane Vol: 88.7 ml 46.55 ml/m  AORTIC VALVE LVOT Vmax:   100.37 cm/s LVOT Vmean:  71.633 cm/s LVOT VTI:    0.228 m  AORTA Ao Root diam: 3.00 cm MITRAL VALVE                         TRICUSPID VALVE MV Area (PHT): 4.60 cm              TR Peak grad:   21.2 mmHg MV PHT:        47.85 msec            TR Vmax:        230.00 cm/s MV Decel Time: 165 msec MV E velocity: 96.00 cm/s  103 cm/s  SHUNTS MV A velocity: 104.00 cm/s 70.3 cm/s Systemic VTI:  0.23 m MV E/A ratio:  0.92        1.5       Systemic Diam: 2.00 cm  Rozann Lesches MD Electronically signed by Rozann Lesches MD Signature Date/Time: 05/22/2019/2:36:48 PM    Final    US THORACENTESIS ASP PLEURAL SPACE W/IMG GUIDE  Result Date: 05/25/2019 INDICATION: SOB Bilat pleural effusions Right thoracentesis 1/4: 530 cc Left thoracentesis today EXAM: ULTRASOUND GUIDED Left THORACENTESIS MEDICATIONS: 10 cc 1% lidocaine COMPLICATIONS: None immediate PROCEDURE: An ultrasound guided thoracentesis was thoroughly discussed with the patient and questions answered. The benefits,  risks, alternatives and complications were also discussed. The patient understands and wishes to proceed with the procedure. Written consent was obtained. Ultrasound was performed to localize and mark an adequate pocket of fluid in the left chest. The area was then prepped and draped in the normal sterile fashion. 1% Lidocaine was used for local anesthesia. Under ultrasound guidance a 19 gauge 7 cm Yueh catheter was introduced. Thoracentesis was performed. The catheter was removed and a dressing applied. FINDINGS: A total of approximately 600 cc of yellow fluid was removed. Samples were sent to the laboratory as requested by the clinical team. IMPRESSION: Successful ultrasound guided left thoracentesis yielding 600 cc of pleural fluid. Read by Lavonia Drafts [facility] Electronically Signed   By: Lavonia Dana M.D.   On: 05/25/2019 11:48   US THORACENTESIS ASP PLEURAL SPACE W/IMG GUIDE  Result Date: 05/22/2019 INDICATION: BILATERAL pleural effusions, history hypertension, diabetes mellitus, cervical cancer post radiation therapy EXAM: ULTRASOUND GUIDED DIAGNOSTIC AND THERAPEUTIC RIGHT THORACENTESIS MEDICATIONS: None COMPLICATIONS: None immediate PROCEDURE: Procedure, benefits, and risks of procedure were discussed with patient. Written informed consent for procedure was  obtained. Time out protocol followed. Pleural effusion localized by ultrasound at the posterior RIGHT hemithorax. Skin prepped and draped in usual sterile fashion. Skin and soft tissues anesthetized with 10 mL of 1% lidocaine. 8 French thoracentesis catheter placed into the RIGHT pleural space. 530 mL of yellow RIGHT pleural fluid aspirated by syringe pump. Procedure tolerated well by patient without immediate complication. FINDINGS: A total of approximately 530 mL of RIGHT pleural fluid was removed. Samples were sent to the laboratory as requested by the clinical team. IMPRESSION: Successful ultrasound guided RIGHT thoracentesis yielding 530 mL of  pleural fluid. Electronically Signed   By: Lavonia Dana M.D.   On: 05/22/2019 09:47      Subjective: Feels her breathing is improving.  Able to ambulate on room air without significant shortness of breath.  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     Microbiology: Recent Results (from the past 240 hour(s))  Respiratory Panel by RT PCR (Flu A&B, Covid) - Nasopharyngeal Swab     Status: None   Collection Time: 05/21/19  3:44 PM   Specimen: Nasopharyngeal Swab  Result Value Ref Range Status   SARS Coronavirus 2 by RT PCR NEGATIVE NEGATIVE Final    Comment: (NOTE) SARS-CoV-2 target nucleic acids are NOT DETECTED. The SARS-CoV-2 RNA is generally detectable in upper respiratoy specimens during the acute phase of infection. The lowest concentration of SARS-CoV-2 viral copies this assay can detect is 131 copies/mL. A negative result does not preclude SARS-Cov-2 infection and should not be used as the sole basis for treatment or other patient management decisions. A negative result may occur with  improper specimen collection/handling, submission of specimen other than nasopharyngeal swab, presence of viral mutation(s) within the areas targeted by this assay, and inadequate number of viral copies (<131 copies/mL). A negative result must be combined with  clinical observations, patient history, and epidemiological information. The expected result is Negative. Fact Sheet for Patients:  PinkCheek.be Fact Sheet for Healthcare Providers:  GravelBags.it This test is not yet ap proved or cleared by the [place] FDA and  has been authorized for detection and/or diagnosis of SARS-CoV-2 by FDA under an Emergency Use Authorization (EUA). This EUA will remain  in effect (meaning this test can be used) for the duration of the COVID-19 declaration under Section 564(b)(1) of the Act, 21 U.S.C. section 360bbb-3(b)(1), unless the authorization is terminated or revoked sooner.    Influenza A by PCR NEGATIVE NEGATIVE Final   Influenza B by PCR NEGATIVE NEGATIVE Final    Comment: (NOTE) The Xpert Xpress SARS-CoV-2/FLU/RSV assay is intended as an aid in  the diagnosis of influenza from Nasopharyngeal swab specimens and  should not be used as a sole basis for treatment. Nasal washings and  aspirates are unacceptable for Xpert Xpress SARS-CoV-2/FLU/RSV  testing. Fact Sheet for Patients: PinkCheek.be Fact Sheet for Healthcare Providers: GravelBags.it This test is not yet approved or cleared by the [place] FDA and  has been authorized for detection and/or diagnosis of SARS-CoV-2 by  FDA under an Emergency Use Authorization (EUA). This EUA will remain  in effect (meaning this test can be used) for the duration of the  Covid-19 declaration under Section 564(b)(1) of the Act, 21  U.S.C. section 360bbb-3(b)(1), unless the authorization is  terminated or revoked. Performed at [facility], [address] [place], [place] [address]   Culture, body fluid-bottle     Status: None   Collection Time: 05/22/19  9:12 AM   Specimen: Pleura  Result Value Ref Range Status   Specimen Description PLEURAL  Final   Special Requests BOTTLES DRAWN  AEROBIC AND ANAEROBIC 10CC  Final   Culture   Final    NO GROWTH 5 DAYS Performed at [facility], [address] [place], [place] [address]    Report Status 05/27/2019 FINAL  Final  Gram stain     Status: None   Collection Time: 05/22/19  9:13 AM   Specimen: Pleura; Body Fluid  Result Value Ref Range Status   Specimen Description PLEURAL  Final   Special Requests NONE  Final   Gram Stain   Final    NO ORGANISMS SEEN WBC PRESENT, PREDOMINANTLY MONONUCLEAR CYTOSPIN SMEAR Performed at [facility], [address], [place], [place] [address]    Report Status 05/22/2019 FINAL  Final  MRSA PCR Screening     Status: None   Collection Time: 05/22/19  2:55 PM   Specimen: Nasal Mucosa; Nasopharyngeal  Result Value Ref Range Status   MRSA by PCR NEGATIVE NEGATIVE Final    Comment:        The GeneXpert MRSA Assay (FDA approved for NASAL specimens only), is one component of a comprehensive MRSA colonization surveillance program. It is not intended to diagnose MRSA infection nor to guide or monitor treatment for MRSA infections. Performed at [facility], [address], [place],  [address]      Labs: BNP (last 3 results) Recent Labs    05/21/19 1455  BNP [id number]*   Basic Metabolic Panel: Recent Labs  Lab 05/23/19 0755 05/24/19 0414 05/25/19 0446 05/26/19 0441 05/27/19 0513  NA 139 137 135 135 136  K 3.6 3.5 4.5 3.7 4.7  CL 104 99 101 99 98  CO2 30 29 26 27 31   GLUCOSE 84 96 110* 112* 138*  BUN 13 15 14 12 21   CREATININE 0.46 0.56 0.58 0.54 0.72  CALCIUM 8.0* 8.2* 8.2* 8.5* 8.6*  MG 1.8  --   --   --   --    Liver Function Tests: No results for input(s): AST, ALT, ALKPHOS, BILITOT, PROT, ALBUMIN in the last 168 hours. No results for input(s): LIPASE, AMYLASE in the last 168 hours. No results for input(s): AMMONIA in the last 168 hours. CBC: Recent Labs  Lab 05/23/19 0755  WBC 4.4  HGB 9.9*  HCT 32.8*  MCV 101.2*  PLT 280   Cardiac Enzymes: No  results for input(s): CKTOTAL, CKMB, CKMBINDEX, TROPONINI in the last 168 hours. BNP: Invalid input(s): POCBNP CBG: Recent Labs  Lab 05/26/19 1110 05/26/19 1647 05/26/19 2111 05/27/19 0756 05/27/19 1147  GLUCAP 122* 120* 162* 131* 126*   D-Dimer No results for input(s): DDIMER in the last 72 hours. Hgb A1c No results for  input(s): HGBA1C in the last 72 hours. Lipid Profile No results for input(s): CHOL, HDL, LDLCALC, TRIG, CHOLHDL, LDLDIRECT in the last 72 hours. Thyroid function studies No results for input(s): TSH, T4TOTAL, T3FREE, THYROIDAB in the last 72 hours.  Invalid input(s): FREET3 Anemia work up No results for input(s): VITAMINB12, FOLATE, FERRITIN, TIBC, IRON, RETICCTPCT in the last 72 hours. Urinalysis No results found for: COLORURINE, APPEARANCEUR, [place], [address], [place], [place], [place], [place], PROTEINUR, UROBILINOGEN, NITRITE, LEUKOCYTESUR Sepsis Labs Invalid input(s): PROCALCITONIN,  WBC,  LACTICIDVEN Microbiology Recent Results (from the past 240 hour(s))  Respiratory Panel by RT PCR (Flu A&B, Covid) - Nasopharyngeal Swab     Status: None   Collection Time: 05/21/19  3:44 PM   Specimen: Nasopharyngeal Swab  Result Value Ref Range Status   SARS Coronavirus 2 by RT PCR NEGATIVE NEGATIVE Final    Comment: (NOTE) SARS-CoV-2 target nucleic acids are NOT DETECTED. The SARS-CoV-2 RNA is generally detectable in upper respiratoy specimens during the acute phase of infection. The lowest concentration of SARS-CoV-2 viral copies this assay can detect is 131 copies/mL. A negative result does not preclude SARS-Cov-2 infection and should not be used as the sole basis for treatment or other patient management decisions. A negative result may occur with  improper specimen collection/handling, submission of specimen other than nasopharyngeal swab, presence of viral mutation(s) within the areas targeted by this assay, and inadequate number of viral copies (<131  copies/mL). A negative result must be combined with clinical observations, patient history, and epidemiological information. The expected result is Negative. Fact Sheet for Patients:  PinkCheek.be Fact Sheet for Healthcare Providers:  GravelBags.it This test is not yet ap proved or cleared by the [place] FDA and  has been authorized for detection and/or diagnosis of SARS-CoV-2 by FDA under an Emergency Use Authorization (EUA). This EUA will remain  in effect (meaning this test can be used) for the duration of the COVID-19 declaration under Section 564(b)(1) of the Act, 21 U.S.C. section 360bbb-3(b)(1), unless the authorization is terminated or revoked sooner.    Influenza A by PCR NEGATIVE NEGATIVE Final   Influenza B by PCR NEGATIVE NEGATIVE Final    Comment: (NOTE) The Xpert Xpress SARS-CoV-2/FLU/RSV assay is intended as an aid in  the diagnosis of influenza from Nasopharyngeal swab specimens and  should not be used as a sole basis for treatment. Nasal washings and  aspirates are unacceptable for Xpert Xpress SARS-CoV-2/FLU/RSV  testing. Fact Sheet for Patients: PinkCheek.be Fact Sheet for Healthcare Providers: GravelBags.it This test is not yet approved or cleared by the [place] FDA and  has been authorized for detection and/or diagnosis of SARS-CoV-2 by  FDA under an Emergency Use Authorization (EUA). This EUA will remain  in effect (meaning this test can be used) for the duration of the  Covid-19 declaration under Section 564(b)(1) of the Act, 21  U.S.C. section 360bbb-3(b)(1), unless the authorization is  terminated or revoked. Performed at [facility], [address] [place], [place] [address]   Culture, body fluid-bottle     Status: None   Collection Time: 05/22/19  9:12 AM   Specimen: Pleura  Result Value Ref Range Status   Specimen  Description PLEURAL  Final   Special Requests BOTTLES DRAWN AEROBIC AND ANAEROBIC 10CC  Final   Culture   Final    NO GROWTH 5 DAYS Performed at [facility], [address] [place], [place] [address]    Report Status 05/27/2019 FINAL  Final  Gram stain  Status: None   Collection Time: 05/22/19  9:13 AM   Specimen: Pleura; Body Fluid  Result Value Ref Range Status   Specimen Description PLEURAL  Final   Special Requests NONE  Final   Gram Stain   Final    NO ORGANISMS SEEN WBC PRESENT, PREDOMINANTLY MONONUCLEAR CYTOSPIN SMEAR Performed at [facility], [address], [place], [place] [address]    Report Status 05/22/2019 FINAL  Final  MRSA PCR Screening     Status: None   Collection Time: 05/22/19  2:55 PM   Specimen: Nasal Mucosa; Nasopharyngeal  Result Value Ref Range Status   MRSA by PCR NEGATIVE NEGATIVE Final    Comment:        The GeneXpert MRSA Assay (FDA approved for NASAL specimens only), is one component of a comprehensive MRSA colonization surveillance program. It is not intended to diagnose MRSA infection nor to guide or monitor treatment for MRSA infections. Performed at [facility], [address], [place], [place] [address]      Time coordinating discharge: 17mins  SIGNED:   Kathie Dike, MD  [facility] 05/29/2019, 8:38 PM   If 7PM-7AM, please contact night-coverage [email or web address]

## 2019-05-30 ENCOUNTER — Encounter: Payer: Self-pay | Admitting: Physical Therapy

## 2019-05-30 ENCOUNTER — Ambulatory Visit: Payer: Medicare HMO | Attending: Radiation Oncology | Admitting: Physical Therapy

## 2019-05-30 ENCOUNTER — Other Ambulatory Visit: Payer: Self-pay

## 2019-05-30 DIAGNOSIS — R5381 Other malaise: Secondary | ICD-10-CM | POA: Diagnosis present

## 2019-05-30 DIAGNOSIS — C210 Malignant neoplasm of anus, unspecified: Secondary | ICD-10-CM | POA: Diagnosis present

## 2019-05-30 DIAGNOSIS — M62838 Other muscle spasm: Secondary | ICD-10-CM | POA: Insufficient documentation

## 2019-05-30 DIAGNOSIS — M6281 Muscle weakness (generalized): Secondary | ICD-10-CM | POA: Diagnosis present

## 2019-05-30 NOTE — Therapy (Addendum)
[facility] [address], [place], [place], [address] Phone: [phone]   Fax:  [phone]  Physical Therapy Evaluation  Patient Details  Name: Jacqueline Bird MRN: [id number] Date of Birth: November 07, 1950 Referring Provider (PT): Hayden Pedro, [place]   Encounter Date: 05/30/2019  PT End of Session - 05/30/19 1506    Visit Number  1    Date for PT Re-Evaluation  08/22/2019    Authorization Type  [employer]    PT Start Time  1147    PT Stop Time  1228    PT Time Calculation (min)  41 min    Activity Tolerance  Patient tolerated treatment well;Patient limited by fatigue    Behavior During Therapy  [facility] for tasks assessed/performed       Past Medical History:  Diagnosis Date  . 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 Cervical cancer ([place])   . Depression   . Diabetes mellitus without complication ([place])   . History of cervical cancer   . Hypertension     Past Surgical History:  Procedure Laterality Date  . CATARACT EXTRACTION, BILATERAL Right 05/2017  . CERVICAL CONE BIOPSY    . HAND AMPUTATION Left   . PORTACATH PLACEMENT Left 02/08/2019   Procedure: INSERTION PORT-A-CATH;  Surgeon: Virl Cagey, MD;  Location: [facility];  Service: General;  Laterality: Left;  . RECTAL BIOPSY  01/26/2019   Procedure: BIOPSY OF ANAL MASS;  Surgeon: Virl Cagey, MD;  Location: [facility];  Service: General;;  . RECTAL EXAM UNDER ANESTHESIA N/A 01/26/2019   Procedure: RECTAL EXAM UNDER ANESTHESIA;  Surgeon: Virl Cagey, MD;  Location: [facility];  Service: General;  Laterality: N/A;    There were no vitals filed for this visit.   Subjective Assessment - 05/30/19 1215    Subjective  Pt hasn't walked since first week in November due to undergoing cancer treatments.  She was walking regularly 5-6 miles/day before that.  She is still unable to do that due to the diaper she is wearing causing chaffing on wounds  that she has from radiation treatment.  Pt states she has not had any leakage for 3 weeks, but was in the hospital for 6 days last week due to acute flare up of CHF.  Pt has mostly been in bed due to pelvic wounds.    Pertinent History  anal cancer, radiation and chemo treatments, prolongued bedrest, CHF    Limitations  Walking;Standing;Sitting    Patient Stated Goals  be able to take walks again and get things more back to normal    Currently in Pain?  Yes    Pain Score  6     Pain Location  Buttocks    Pain Orientation  Right;Left    Pain Descriptors / Indicators  Burning;Constant    Pain Onset  More than a month ago    Pain Frequency  Constant    Aggravating Factors   walking and chaffing from diaper; cannot sit, standing increases swelling of the groin    Pain Relieving Factors  lying down without diaper on    Effect of Pain on Daily Activities  unable to take walks, sit, standing longer than 5-10 minutes    Multiple Pain Sites  No         [facility] PT Assessment - 06/05/19 0001      Assessment   Medical Diagnosis  C21.0 (ICD-10-CM) - Anal cancer ([place]  Referring Provider (PT)  Hayden Pedro, PA-C    Onset Date/Surgical Date  --   completed radiation therapy on April 07, 2019   Prior Therapy  No      Precautions   Precautions  None      Restrictions   Weight Bearing Restrictions  No      Home Environment   Living Environment  Private residence    Living Arrangements  Children   daughter     Prior Function   Level of Independence  Independent      Cognition   Overall Cognitive Status  Within Functional Limits for tasks assessed      Observation/Other Assessments   Observations  wounds bilateral groin and glueal fold from radiation treatment      PROM   Overall PROM Comments  hip rotation 50%      Strength   Overall Strength Comments  grossly 4/5 LE; core weakness with reduced tone       Flexibility   Soft Tissue Assessment /Muscle Length  yes     Hamstrings  75% bilateral                Objective measurements completed on examination: See above findings.    Pelvic Floor Special Questions - 06/05/19 0001    Prior Pelvic/Prostate Exam  Yes    Skin Integrity  Other    Exam Type  Deferred       [facility] Adult PT Treatment/Exercise - 06/05/19 0001      Ambulation/Gait   Gait Pattern  Decreased stride length    Gait Comments  slow and small hip ROM due to pain      Posture/Postural Control   Posture/Postural Control  Postural limitations    Postural Limitations  Rounded Shoulders;Increased thoracic kyphosis    Posture Comments  cannot sit or stand throughout evaluation due to pain             PT Education - 05/30/19 1530    Education Details  Access Code: [email or web address]    Person(s) Educated  Patient    Methods  Explanation;Demonstration;Tactile cues;Verbal cues;Handout    Comprehension  Verbalized understanding;Returned demonstration       PT Short Term Goals - 06/02/19 1105      PT SHORT TERM GOAL #1   Title  pt will be ind with initial HEP    Time  4    Period  Weeks    Status  New    Target Date  06/30/19      PT SHORT TERM GOAL #2   Title  Pt will feel 25% more energy due to being able to initiate exercise program    Time  4    Period  Weeks    Status  New    Target Date  06/30/19      PT SHORT TERM GOAL #3   Title  Pt will be able to perform 6 min walk test due to reduced pelvic pain so she can progress towards return to walking as part of healthy lifestyle    Time  4    Period  Weeks    Status  New        PT Long Term Goals - 06/05/19 1105      PT LONG TERM GOAL #1   Title  Pt will be able to perform 6 min walk test due to reduced pelvic pain so she can progress towards return to walking as part of healthy  lifestyle    Time  12    Period  Weeks    Status  New    Target Date  08/22/19      PT LONG TERM GOAL #2   Title  Pt will be able to stand and walk for 20 minutes at a time without  increased swelling in the groin in order to return to functional activities    Time  12    Period  Weeks    Status  New    Target Date  08/22/19      PT LONG TERM GOAL #3   Title  Pt will be ind with advanced HEP    Time  12    Period  Weeks    Status  New    Target Date  08/22/19      PT LONG TERM GOAL #4   Title  Pt will report incontinence is controlled as her activity level increases and that leakage is not a limiting factor in her returning to her PLOF    Time  12    Period  Weeks    Status  New    Target Date  08/22/19             Plan - 05/30/19 1508    Clinical Impression Statement  Pt presents to clinic about 8 weeks after completed radiation treatment for anal cancer.  Pt still has open wounds around the anus, labia, and goin bilaterally.  Pt has hip ROM limited due to pain; Lt more limited than Rt.  Pt has deconditioned abdominal muscles and lacking coordination to activate abdominals correctly without holding her breath. Pt was educated in this today in order to start HEP as seen in chart. She improved with instructions given.  Pt has weakness of about 4/5 throughout bilateral LE.  She is deconditions and can only tolerate standing for 10 minutes at most.  Pt has tension throughout bilateral gluteals and TFL muscles.  Pt will benefit from skilled PT to address impairments and improve endurance so she can get back onto heart healthy exercise and walking program as well as improve her activity level to greater function.    Personal Factors and Comorbidities  Comorbidity 3+    Comorbidities  radiation and chemo treatments, CHF, pelvic wounds from radiation still present    Examination-Activity Limitations  Sit;Stand    Examination-Participation Restrictions  Community Activity    Stability/Clinical Decision Making  Evolving/Moderate complexity    Clinical Decision Making  Moderate    Rehab Potential  Excellent    PT Frequency  2x / week    PT Duration  --   16 weeks;  2x/week when able   PT Treatment/Interventions  ADLs/Self Care Home Management;Biofeedback;Cryotherapy;Electrical Stimulation;Moist Heat;Neuromuscular re-education;Therapeutic exercise;Therapeutic activities;Patient/family education;Gait training;Manual techniques;Taping;Passive range of motion;Dry needling;Energy conservation    PT Next Visit Plan  look at 5x sit to stand; ROM and stretching    PT Home Exercise Plan  Access Code: [email or web address]    Consulted and Agree with Plan of Care  Patient       Patient will benefit from skilled therapeutic intervention in order to improve the following deficits and impairments:    Abnormal gait; Impaired tone; Increased fascial restricitons; Difficulty walking; Pain; Decreased skin integrity; Decreased activity tolerance; Impaired flexibility; Decreased strength; Increased muscle spasms; Decreased range of motion  Visit Diagnosis: Muscle weakness (generalized)  Other muscle spasm  Physical deconditioning  Anal cancer ([place])     Problem List Patient  Active Problem List   Diagnosis Date Noted  . Pleural effusion, right   . Acute diastolic CHF (congestive heart failure) ([place]) 05/23/2019  . Acute respiratory failure with hypoxia ([place]) 05/23/2019  . Hypokalemia 05/23/2019  . Essential hypertension 05/23/2019  . Type 2 diabetes mellitus with other specified complication ([place]) [id number]  . Anxiety 05/23/2019  . CHF (congestive heart failure) ([place]) 05/21/2019  . Anal cancer ([place]) 02/01/2019  . Mass of anus 01/24/2019    Jule Ser, PT 06/05/2019, 11:37 AM  [facility] [address] [place], [place], [address] Phone: [phone]   Fax:  [phone]  Name: Shavontae Wilcoxson MRN: [id number] Date of Birth: 1950/07/23

## 2019-05-30 NOTE — Patient Instructions (Signed)
Access Code: [email or web address]  URL: [email or web address]  Date: 05/30/2019  Prepared by: Jari Favre   Exercises Supine Figure 4 Piriformis Stretch - 3 reps - 1 sets - 30 sec hold - 2x daily - 7x weekly Supine Bilateral Hip Internal Rotation Stretch - 3 reps - 1 sets - 30 sec hold - 2x daily - 7x weekly Supine Single Knee to Chest Stretch - 5 reps - 1 sets - 10 sec hold                            - 2x daily - 7x weekly Hooklying Small March - 10 reps - 2 sets - 1x daily - 7x weekly Supine Bridge with Pelvic Floor Contraction - 10 reps - 1 sets - 2x daily - 7x weekly Supine Straight Leg Raise with Pelvic Floor Contraction - 10 reps - 1 sets - 2x daily - 7x weekly Bent Knee Fallouts - 10 reps - 1 sets - 2x daily - 7x weekly

## 2019-06-01 ENCOUNTER — Telehealth (HOSPITAL_COMMUNITY): Payer: Self-pay | Admitting: *Deleted

## 2019-06-01 NOTE — Telephone Encounter (Signed)
Verbal order given from provider to continue care with Bernice for this pt.

## 2019-06-05 NOTE — Addendum Note (Signed)
Addended by: Jari Favre L on: 06/05/2019 11:40 AM   Modules accepted: Orders

## 2019-06-08 ENCOUNTER — Encounter: Payer: Self-pay | Admitting: Cardiology

## 2019-06-08 ENCOUNTER — Ambulatory Visit: Payer: Medicare HMO | Admitting: Physical Therapy

## 2019-06-08 NOTE — Progress Notes (Signed)
Cardiology Office Note  Date: 06/09/2019   ID: Jacqueline Bird, DOB 07-09-1950, MRN [id number]  PCP:  Lucia Gaskins, MD  Consulting Cardiologist: Satira Sark, MD Electrophysiologist:  None   Chief Complaint  Patient presents with  . Recent diagnosis of diastolic heart failure    History of Present Illness: Jacqueline Bird is a 69 y.o. female referred for cardiology consultation by Dr. Roderic Palau for evaluation of newly documented diastolic heart failure.  She was recently hospitalized in January due to progressive shortness of breath.  She was found to have acute hypoxic respiratory failure associated with fluid overload and pleural effusions.  She required BiPAP initially and eventually weaned oxygen requirement in association with IV diuresis and weight loss of 17 pounds.  She underwent bilateral thoracenteses as well, consistent with transudate.  Echocardiography revealed LVEF 50 to 55% with basal inferior/inferolateral hypokinesis, grade 1 diastolic dysfunction, normal RV contraction, moderate left atrial enlargement, and mildly elevated RVSP at 36 mmHg.  She follows with Dr. 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  She has been treated with chemotherapy (Mutamycin, Adrucil) and also radiation.  She is not certain at this time whether she will require any additional treatments.  She presents today with her son.  She states that over the last 2 weeks she feels much better.  No shortness of breath at this time with basic activities, she plans to get back to walking regularly.  She has no leg swelling, no orthopnea or PND.  She just recently got a scale and previously had not been weighing regularly.  I reviewed her medications.  She has continued Lasix 40 mg daily with KCl 20 mEq daily.  She mentions that she does drink quite a bit of fluids and has done so for a long time at baseline.  Sounds like a gallon of fluid in 24 hours.  We discussed  reducing her regular fluid intake.  Past Medical History:  Diagnosis Date  . 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 Anal cancer ([place])    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  . Cervical cancer ([place])   . Depression   . Essential hypertension   . History of cervical cancer   . Mixed hyperlipidemia   . Type 2 diabetes mellitus ([place])     Past Surgical History:  Procedure Laterality Date  . CATARACT EXTRACTION, BILATERAL Right 05/2017  . CERVICAL CONE BIOPSY    . HAND AMPUTATION Left   . PORTACATH PLACEMENT Left 02/08/2019   Procedure: INSERTION PORT-A-CATH;  Surgeon: Virl Cagey, MD;  Location: [facility];  Service: General;  Laterality: Left;  . RECTAL BIOPSY  01/26/2019   Procedure: BIOPSY OF ANAL MASS;  Surgeon: Virl Cagey, MD;  Location: [facility];  Service: General;;  . RECTAL EXAM UNDER ANESTHESIA N/A 01/26/2019   Procedure: RECTAL EXAM UNDER ANESTHESIA;  Surgeon: Virl Cagey, MD;  Location: [facility];  Service: General;  Laterality: N/A;    Current Outpatient Medications  Medication Sig Dispense Refill  . ALPRAZolam (XANAX) 0.5 MG tablet Take 0.5 mg by mouth at bedtime as needed for anxiety or sleep.    Marland Kitchen aspirin EC 81 MG EC tablet Take 1 tablet (81 mg total) by mouth daily. 30 tablet 0  . furosemide (LASIX) 40 MG tablet Take 1 tablet (40 mg total) by mouth daily. For 2 weeks; then decrease to 20 mg daily 30 tablet 1  . gabapentin (  NEURONTIN) 100 MG capsule Take 100-200 mg by mouth See admin instructions. Take 200mg  in the AM and 100mg  in the PM.    . HYDROcodone-acetaminophen (NORCO) 10-325 MG tablet Take 1 tablet by mouth every 4 (four) hours as needed. 84 tablet 0  . Potassium Chloride ER 20 MEQ TBCR Take 20 mEq by mouth daily. For 2 weeks; then decrease to 10 meq daily 30 tablet 1  . silver sulfADIAZINE (SILVADENE) 1 % cream Apply to affected area daily (Patient taking differently: Apply 1 application topically 4 (four) times daily. Apply to affected  area) 1000 g 3  . TRESIBA FLEXTOUCH 200 UNIT/ML SOPN Inject 20 Units into the skin daily before breakfast.    . TRINTELLIX 10 MG TABS tablet Take 10 mg by mouth daily.    . TRULICITY A999333 0000000 SOPN Inject 0.75 mg into the muscle every Sunday.     . simvastatin (ZOCOR) 40 MG tablet Take 40 mg by mouth daily.      No current facility-administered medications for this visit.   Facility-Administered Medications Ordered in Other Visits  Medication Dose Route Frequency Provider Last Rate Last Admin  . heparin lock flush 100 unit/mL  500 Units Intracatheter Once PRN Derek Jack, MD      . sodium chloride flush (NS) 0.9 % injection 10 mL  10 mL Intracatheter PRN Derek Jack, MD   10 mL at 03/03/19 1152   Allergies:  Sulfa antibiotics   Social History: The patient  reports that she has never smoked. She has never used smokeless tobacco. She reports previous alcohol use. She reports that she does not use drugs.   Family History: The patient's family history includes Breast cancer in her niece and niece; Cataracts in her brother, mother, and sister; Diabetes in her brother, father, maternal grandfather, and sister; Glaucoma in her mother; Heart attack in her father; Heart disease in her brother, mother, and sister; Lung disease in her sister; Macular degeneration in her maternal aunt; Throat cancer in her son.   ROS:  Please see the history of present illness. Otherwise, complete review of systems is positive for hearing loss, wears hearing aid..  All other systems are reviewed and negative.   Physical Exam: VS:  BP (!) 92/59   Pulse 100   Ht 5' 4.5" (1.638 m)   Wt 184 lb 12.8 oz (83.8 kg)   SpO2 100%   BMI 31.23 kg/m , BMI Body mass index is 31.23 kg/m.  Wt Readings from Last 3 Encounters:  06/09/19 184 lb 12.8 oz (83.8 kg)  05/26/19 184 lb 15.5 oz (83.9 kg)  03/31/19 201 lb 9.6 oz (91.4 kg)    General: Patient appears comfortable at rest. HEENT: Conjunctiva and lids  normal, wearing a mask. Neck: Supple, no elevated JVP or carotid bruits, no thyromegaly. Lungs: Clear to auscultation, nonlabored breathing at rest. Cardiac: Regular rate and rhythm, no S3 or significant systolic murmur, no pericardial rub. Abdomen: Soft, nontender, bowel sounds present. Extremities: No pitting edema, distal pulses 2+. Skin: Warm and dry. Musculoskeletal: No kyphosis. Neuropsychiatric: Alert and oriented x3, affect grossly appropriate.  ECG:  An ECG dated 05/21/2019 was personally reviewed today and demonstrated:  Sinus rhythm with ventricular bigeminy.  Recent Labwork: 05/21/2019: B Natriuretic Peptide 683.0 05/22/2019: ALT 13; AST 20; TSH 1.461 05/23/2019: Hemoglobin 9.9; Magnesium 1.8; Platelets 280 05/27/2019: BUN 21; Creatinine, Ser 0.72; Potassium 4.7; Sodium 136   Other Studies Reviewed Today:  Echocardiogram 05/22/2019:  1. Left ventricular ejection fraction, by visual  estimation, is 50 to 55%. The left ventricle has low normal function. There is no left ventricular hypertrophy.  2. Basal inferolateral segment and basal inferior segment are abnormal.  3. Left ventricular diastolic parameters are consistent with Grade I diastolic dysfunction (impaired relaxation).  4. The left ventricle demonstrates regional wall motion abnormalities.  5. Global right ventricle has normal systolic function.The right ventricular size is normal. No increase in right ventricular wall thickness.  6. Left atrial size was moderately dilated.  7. Right atrial size was normal.  8. The mitral valve is grossly normal. Mild mitral valve regurgitation.  9. The tricuspid valve is grossly normal. 10. The aortic valve is tricuspid. Aortic valve regurgitation is not visualized. 11. The pulmonic valve was grossly normal. Pulmonic valve regurgitation is trivial. 12. Mildly elevated pulmonary artery systolic pressure. 13. The tricuspid regurgitant velocity is 2.30 m/s, and with an assumed right atrial  pressure of 15 mmHg, the estimated right ventricular systolic pressure is mildly elevated at 36.2 mmHg. 14. The inferior vena cava is dilated in size with <50% respiratory variability, suggesting right atrial pressure of 15 mmHg.  Assessment and Plan:  1.  Recent diagnosis of acute diastolic heart failure.  She had not been weighing at baseline regularly, so onset of fluid overload is uncertain.  She has been treated for stage III squamous cell carcinoma of the anus and has undergone chemotherapy with Mutamycin and Adrucil in addition to radiation.  Brief review of both chemotherapeutic agents does mention potential CHF/cardiotoxicity.  Echocardiogram revealed LVEF 50 to 55% with mild diastolic dysfunction, basal inferior/inferolateral hypokinesis.  At this time we plan to continue Lasix 40 mg daily and KCl 20 equivalents daily, in 2 weeks we will cut both doses in half.  She will continue to weigh daily.  We will schedule an office follow-up in [place] for 1 month.  At that time can clinically reassess and determine if she needs any follow-up chest imaging, and perhaps a repeat echocardiogram to ensure no significant change.  No ischemic work-up planned at this time.  2.  Mixed hyperlipidemia, on Zocor.  3.  Essential hypertension by history.  She is hypotensive today but asymptomatic and recent blood pressure looks stable over healthcare visits in December 2020.  She is not on any antihypertensive medications at this time.  Medication Adjustments/Labs and Tests Ordered: Current medicines are reviewed at length with the patient today.  Concerns regarding medicines are outlined above.   Tests Ordered: Orders Placed This Encounter  Procedures  . Basic metabolic panel    Medication Changes: Meds ordered this encounter  Medications  . Potassium Chloride ER 20 MEQ TBCR    Sig: Take 20 mEq by mouth daily. For 2 weeks; then decrease to 10 meq daily    Dispense:  30 tablet    Refill:  1  .  furosemide (LASIX) 40 MG tablet    Sig: Take 1 tablet (40 mg total) by mouth daily. For 2 weeks; then decrease to 20 mg daily    Dispense:  30 tablet    Refill:  1    Disposition:  Follow up 1 month in the [place] office with [place].  Signed, Satira Sark, MD, [facility] 06/09/2019 9:42 AM    [facility] [place], [place], [place] [address] Phone: [phone]; Fax: [phone]

## 2019-06-09 ENCOUNTER — Other Ambulatory Visit: Payer: Self-pay

## 2019-06-09 ENCOUNTER — Ambulatory Visit: Payer: Medicare HMO | Admitting: Cardiology

## 2019-06-09 ENCOUNTER — Encounter: Payer: Self-pay | Admitting: Cardiology

## 2019-06-09 VITALS — BP 92/59 | HR 100 | Ht 64.5 in | Wt 184.8 lb

## 2019-06-09 DIAGNOSIS — I5031 Acute diastolic (congestive) heart failure: Secondary | ICD-10-CM | POA: Diagnosis not present

## 2019-06-09 DIAGNOSIS — I10 Essential (primary) hypertension: Secondary | ICD-10-CM

## 2019-06-09 MED ORDER — POTASSIUM CHLORIDE ER 20 MEQ PO TBCR: 20.0000 meq | EXTENDED_RELEASE_TABLET | Freq: Every day | ORAL | 1 refills | Status: DC

## 2019-06-09 MED ORDER — FUROSEMIDE 40 MG PO TABS: 40.0000 mg | ORAL_TABLET | Freq: Every day | ORAL | 1 refills | Status: DC

## 2019-06-09 NOTE — Patient Instructions (Addendum)
Medication Instructions:    Your physician has recommended you make the following change in your medication:   Take furosemide 40 mg daily for 2 weeks, then decrease to 20 mg daily  Take potassium 20 meq daily for 2 weeks, then decrease to 10 meq daily  Continue other medications the same  Labwork:  Your physician recommends that you return for lab work in: 1 month just before your next visit to check your BMET. This can be done at [facility] or [facility] in [place].   Testing/Procedures:  NONE  Follow-Up:  Your physician recommends that you schedule a follow-up appointment in: 1 month with Bernerd Pho PA (office).  Any Other Special Instructions Will Be Listed Below (If Applicable).  If you need a refill on your cardiac medications before your next appointment, please call your pharmacy.

## 2019-06-14 ENCOUNTER — Other Ambulatory Visit: Payer: Self-pay

## 2019-06-14 ENCOUNTER — Ambulatory Visit: Payer: Medicare HMO | Admitting: Physical Therapy

## 2019-06-14 ENCOUNTER — Telehealth: Payer: Self-pay | Admitting: *Deleted

## 2019-06-14 ENCOUNTER — Encounter: Payer: Self-pay | Admitting: Physical Therapy

## 2019-06-14 DIAGNOSIS — M6281 Muscle weakness (generalized): Secondary | ICD-10-CM | POA: Diagnosis not present

## 2019-06-14 DIAGNOSIS — R5381 Other malaise: Secondary | ICD-10-CM

## 2019-06-14 DIAGNOSIS — C210 Malignant neoplasm of anus, unspecified: Secondary | ICD-10-CM

## 2019-06-14 DIAGNOSIS — M62838 Other muscle spasm: Secondary | ICD-10-CM

## 2019-06-14 NOTE — Telephone Encounter (Signed)
CALLED DR. LINDSEY BRIDGES OFFICE TO ARRANGE ANOSCOPY FOR THIS PATIENT(LATE February), LVM FOR A RETURN CALL

## 2019-06-14 NOTE — Patient Instructions (Signed)
Access Code: [email or web address]  URL: [email or web address]  Date: 06/14/2019  Prepared by: Jari Favre   Exercises Supine Figure 4 Piriformis Stretch - 3 reps - 1 sets - 30 sec hold - 2x daily - 7x weekly Supine Bilateral Hip Internal Rotation Stretch - 3 reps - 1 sets - 30 sec hold - 2x daily - 7x weekly Supine Single Knee to Chest Stretch - 5 reps - 1 sets - 10 sec hold                            - 2x daily - 7x weekly Hooklying Small March - 10 reps - 2 sets - 2x daily - 7x weekly Supine Bridge with Pelvic Floor Contraction - 10 reps - 1 sets - 2x daily - 7x weekly Supine Straight Leg Raise with Pelvic Floor Contraction - 10 reps - 1 sets - 2x daily - 7x weekly Bent Knee Fallouts - 10 reps - 1 sets - 2x daily - 7x weekly Hooklying Active Hamstring Stretch - 3 reps - 1 sets - 20 sec hold                            - 2x daily - 7x weekly Supine Hip Internal and External Rotation - 10 reps - 1 sets - 5 sec hold - 2x daily - 7x weekly

## 2019-06-14 NOTE — Telephone Encounter (Signed)
CALLED PATIENT TO INFORM OF APPT. WITH DR. Mendel Ryder BRIDGES ON 06-29-19 - ARRIVAL TIME- 1:45 PM , ADDRESS- [address], [address], LVM FOR A RETURN CALL

## 2019-06-14 NOTE — Therapy (Addendum)
[facility] [address], [place], [place], [address] Phone: [phone]   Fax:  [phone]  Physical Therapy Treatment  Patient Details  Name: Jacqueline Bird MRN: [id number] Date of Birth: 1950/12/04 Referring Provider (PT): Hayden Pedro, [place]   Encounter Date: 06/14/2019  PT End of Session - 06/14/19 1314    Visit Number  2    Date for PT Re-Evaluation  08/22/19    Authorization Type  [employer]    PT Start Time  1314    PT Stop Time  1356    PT Time Calculation (min)  42 min    Activity Tolerance  Patient tolerated treatment well;Patient limited by fatigue    Behavior During Therapy  [facility] for tasks assessed/performed       Past Medical History:  Diagnosis Date  . 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 Cervical cancer ([place])   . Depression   . Essential hypertension   . History of cervical cancer   . Mixed hyperlipidemia   . Type 2 diabetes mellitus ([place])     Past Surgical History:  Procedure Laterality Date  . CATARACT EXTRACTION, BILATERAL Right 05/2017  . CERVICAL CONE BIOPSY    . HAND AMPUTATION Left   . PORTACATH PLACEMENT Left 02/08/2019   Procedure: INSERTION PORT-A-CATH;  Surgeon: Virl Cagey, MD;  Location: [facility];  Service: General;  Laterality: Left;  . RECTAL BIOPSY  01/26/2019   Procedure: BIOPSY OF ANAL MASS;  Surgeon: Virl Cagey, MD;  Location: [facility];  Service: General;;  . RECTAL EXAM UNDER ANESTHESIA N/A 01/26/2019   Procedure: RECTAL EXAM UNDER ANESTHESIA;  Surgeon: Virl Cagey, MD;  Location: [facility];  Service: General;  Laterality: N/A;    There were no vitals filed for this visit.  Subjective Assessment - 06/14/19 1317    Subjective  I left the exercises in my daughter's car.  The pain is much better but still there and can't sit up straight.    Pertinent History  anal  cancer, radiation and chemo treatments, prolongued bedrest, CHF    Limitations  Walking;Standing;Sitting    Patient Stated Goals  be able to take walks again and get things more back to normal    Currently in Pain?  Yes    Pain Score  4     Pain Location  Buttocks    Pain Orientation  Right;Left    Pain Descriptors / Indicators  Burning;Constant    Pain Type  Chronic pain    Pain Onset  More than a month ago    Pain Frequency  Constant    Multiple Pain Sites  No                       [facility] Adult PT Treatment/Exercise - 06/14/19 0001      Neuro Re-ed    Neuro Re-ed Details   diaphragmatic breathing      Exercises   Exercises  Lumbar      Lumbar Exercises: Stretches   Active Hamstring Stretch  Right;Left;2 reps;20 seconds    Single Knee to Chest Stretch  Right;Left;2 reps;20 seconds    Figure 4 Stretch  2 reps;20 seconds    Other Lumbar Stretch Exercise  lower trunk rotation - 5x      Lumbar Exercises: Supine   Pelvic Tilt  10 reps   with kegel; then kegel without  pelvic tilt   Bent Knee Raise  20 reps    Bridge with Ball Squeeze  10 reps             PT Education - 06/14/19 1354    Education Details  Access Code: [email or web address]    Person(s) Educated  Patient    Methods  Explanation;Demonstration;Verbal cues;Handout;Tactile cues    Comprehension  Verbalized understanding;Returned demonstration       PT Short Term Goals - 06/02/19 1105      PT SHORT TERM GOAL #1   Title  pt will be ind with initial HEP    Time  4    Period  Weeks    Status  New    Target Date  06/30/19      PT SHORT TERM GOAL #2   Title  Pt will feel 25% more energy due to being able to initiate exercise program    Time  4    Period  Weeks    Status  New    Target Date  06/30/19      PT SHORT TERM GOAL #3   Title  Pt will be able to perform 6 min walk test due to reduced pelvic pain so she can progress towards return to walking as part of healthy lifestyle    Time  4     Period  Weeks    Status  New        PT Long Term Goals - 06/05/19 1105      PT LONG TERM GOAL #1   Title  Pt will be able to perform 6 min walk test due to reduced pelvic pain so she can progress towards return to walking as part of healthy lifestyle    Time  12    Period  Weeks    Status  New    Target Date  08/22/19      PT LONG TERM GOAL #2   Title  Pt will be able to stand and walk for 20 minutes at a time without increased swelling in the groin in order to return to functional activities    Time  12    Period  Weeks    Status  New    Target Date  08/22/19      PT LONG TERM GOAL #3   Title  Pt will be ind with advanced HEP    Time  12    Period  Weeks    Status  New    Target Date  08/22/19      PT LONG TERM GOAL #4   Title  Pt will report incontinence is controlled as her activity level increases and that leakage is not a limiting factor in her returning to her PLOF    Time  12    Period  Weeks    Status  New    Target Date  08/22/19            Plan - 06/14/19 1356    Clinical Impression Statement  Pt had less pain today and was able to begin more challenging exercises.  She is still limited to mat exercises and basic core and pelvic floor strengthening.  No goals met yet, today was her initial treatment since eval.    PT Treatment/Interventions  ADLs/Self Care Home Management;Biofeedback;Cryotherapy;Electrical Stimulation;Moist Heat;Neuromuscular re-education;Therapeutic exercise;Therapeutic activities;Patient/family education;Gait training;Manual techniques;Taping;Passive range of motion;Dry needling;Energy conservation    PT Next Visit   Plan  look at 5x sit to stand when tolerated; ROM and stretching, f/u on core and kegels    PT Home Exercise Plan  Access Code: [email or web address]    Consulted and Agree with Plan of Care  Patient       Patient will benefit from skilled therapeutic intervention in order to improve the following deficits and impairments:    Abnormal  gait; Impaired tone; Increased fascial restricitons; Difficulty walking; Pain; Decreased skin integrity; Decreased activity tolerance; Impaired flexibility; Decreased strength; Increased muscle spasms; Decreased range of motion  Visit Diagnosis: Muscle weakness (generalized)  Other muscle spasm  Physical deconditioning  Anal cancer (HCC)     Problem List Patient Active Problem List   Diagnosis Date Noted  . Pleural effusion, right   . Acute diastolic CHF (congestive heart failure) (HCC) 05/23/2019  . Acute respiratory failure with hypoxia (HCC) 05/23/2019  . Hypokalemia 05/23/2019  . Essential hypertension 05/23/2019  . Type 2 diabetes mellitus with other specified complication (HCC) 05/23/2019  . Anxiety 05/23/2019  . CHF (congestive heart failure) (HCC) 05/21/2019  . Anal cancer (HCC) 02/01/2019  . Mass of anus 01/24/2019    Jakki L Desenglau, PT 06/14/2019, 2:03 PM  [facility] [address], [address] [place], [place], [address] Phone: [phone]   Fax:  [phone]  Name: Jacqueline Bird MRN: [id number] Date of Birth: 09/27/1950   

## 2019-06-19 ENCOUNTER — Encounter: Payer: Self-pay | Admitting: Physical Therapy

## 2019-06-19 ENCOUNTER — Other Ambulatory Visit: Payer: Self-pay

## 2019-06-19 ENCOUNTER — Ambulatory Visit: Payer: Medicare HMO | Attending: Radiation Oncology | Admitting: Physical Therapy

## 2019-06-19 DIAGNOSIS — R5381 Other malaise: Secondary | ICD-10-CM

## 2019-06-19 DIAGNOSIS — C210 Malignant neoplasm of anus, unspecified: Secondary | ICD-10-CM

## 2019-06-19 DIAGNOSIS — M6281 Muscle weakness (generalized): Secondary | ICD-10-CM | POA: Insufficient documentation

## 2019-06-19 DIAGNOSIS — M62838 Other muscle spasm: Secondary | ICD-10-CM | POA: Diagnosis present

## 2019-06-19 NOTE — Therapy (Addendum)
[name] [address] [place], [place], [address] Phone: [phone]   Fax:  [phone]  Physical Therapy Treatment  Patient Details  Name: Jacqueline Bird MRN: [id number] Date of Birth: 28-Nov-1950 Referring Provider (PT): Jacqueline Bird, [place]   Encounter Date: 06/19/2019  PT End of Session - 06/19/19 1358    Visit Number  3    Date for PT Re-Evaluation  08/22/19    Authorization Type  [employer] Medicare    PT Start Time  1660    PT Stop Time  1440    PT Time Calculation (min)  42 min    Activity Tolerance  Patient tolerated treatment well;Patient limited by fatigue    Behavior During Therapy  [facility] for tasks assessed/performed       Past Medical History:  Diagnosis Date  . 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 Cervical cancer ([place])   . Depression   . Essential hypertension   . History of cervical cancer   . Mixed hyperlipidemia   . Type 2 diabetes mellitus ([place])     Past Surgical History:  Procedure Laterality Date  . CATARACT EXTRACTION, BILATERAL Right 05/2017  . CERVICAL CONE BIOPSY    . HAND AMPUTATION Left   . PORTACATH PLACEMENT Left 02/08/2019   Procedure: INSERTION PORT-A-CATH;  Surgeon: Virl Cagey, MD;  Location: [facility];  Service: General;  Laterality: Left;  . RECTAL BIOPSY  01/26/2019   Procedure: BIOPSY OF ANAL MASS;  Surgeon: Virl Cagey, MD;  Location: [facility];  Service: General;;  . RECTAL EXAM UNDER ANESTHESIA N/A 01/26/2019   Procedure: RECTAL EXAM UNDER ANESTHESIA;  Surgeon: Virl Cagey, MD;  Location: [facility];  Service: General;  Laterality: N/A;    There were no vitals filed for this visit.      [name] PT Assessment - 06/19/19 0001      Standardized Balance Assessment   Five times sit to stand comments   12 sec                   [facility] Adult PT Treatment/Exercise -  06/19/19 0001      Lumbar Exercises: Stretches   Active Hamstring Stretch  Right;Left;2 reps;20 seconds    Hip Flexor Stretch  Right;Left;2 reps;20 seconds    Other Lumbar Stretch Exercise  fwd flex with green ball roll in sitting - stretch to lumbar and breathing for pelvic floor stretch      Lumbar Exercises: Supine   Heel Slides  10 reps    Bridge  20 reps      Lumbar Exercises: Sidelying   Clam  Right;Left;15 reps   TC to keep pelvis from rocking backwrd     Manual Therapy   Manual Therapy  Soft tissue mobilization    Soft tissue mobilization  lumbar paraspinals and gluteals bilat               PT Short Term Goals - 06/02/19 1105      PT SHORT TERM GOAL #1   Title  pt will be ind with initial HEP    Time  4    Period  Weeks    Status  New    Target Date  06/30/19      PT SHORT TERM GOAL #2   Title  Pt will feel 25% more energy due  to being able to initiate exercise program    Time  4    Period  Weeks    Status  New    Target Date  06/30/19      PT SHORT TERM GOAL #3   Title  Pt will be able to perform 6 min walk test due to reduced pelvic pain so she can progress towards return to walking as part of healthy lifestyle    Time  4    Period  Weeks    Status  New        PT Long Term Goals - 06/05/19 1105      PT LONG TERM GOAL #1   Title  Pt will be able to perform 6 min walk test due to reduced pelvic pain so she can progress towards return to walking as part of healthy lifestyle    Time  12    Period  Weeks    Status  New    Target Date  08/22/19      PT LONG TERM GOAL #2   Title  Pt will be able to stand and walk for 20 minutes at a time without increased swelling in the groin in order to return to functional activities    Time  12    Period  Weeks    Status  New    Target Date  08/22/19      PT LONG TERM GOAL #3   Title  Pt will be ind with advanced HEP    Time  12    Period  Weeks    Status  New    Target Date  08/22/19      PT LONG  TERM GOAL #4   Title  Pt will report incontinence is controlled as her activity level increases and that leakage is not a limiting factor in her returning to her PLOF    Time  12    Period  Weeks    Status  New    Target Date  08/22/19            Plan - 06/19/19 1405    Clinical Impression Statement  Pt was able to progress exercises during today's session.  She is almost able to sit upright on ischial tuberosities.  Pt continues to need cues on engaging her core and still had some questions on intial HEP.  Pt was educated on stretching soft tissue of perineum to begin a more distal fascial release around the urethra.    PT Treatment/Interventions  ADLs/Self Care Home Management;Biofeedback;Cryotherapy;Electrical Stimulation;Moist Heat;Neuromuscular re-education;Therapeutic exercise;Therapeutic activities;Patient/family education;Gait training;Manual techniques;Taping;Passive range of motion;Dry needling;Energy conservation    PT Next Visit Plan  pelvic floor assessment    PT Home Exercise Plan  Access Code: [email or web address]    Consulted and Agree with Plan of Care  Patient       Patient will benefit from skilled therapeutic intervention in order to improve the following deficits and impairments:  Abnormal gait, Impaired tone, Increased fascial restricitons, Difficulty walking, Pain, Decreased skin integrity, Decreased activity tolerance, Impaired flexibility, Decreased strength, Increased muscle spasms, Decreased range of motion  Visit Diagnosis: Muscle weakness (generalized)  Other muscle spasm  Physical deconditioning  Anal cancer ([place])     Problem List Patient Active Problem List   Diagnosis Date Noted  . Pleural effusion, right   . Acute diastolic CHF (congestive heart failure) ([place]) 05/23/2019  . Acute respiratory failure with hypoxia ([place]) 05/23/2019  . Hypokalemia 05/23/2019  .  Essential hypertension 05/23/2019  . Type 2 diabetes mellitus with other specified complication  ([place]) 15/51/6144  . Anxiety 05/23/2019  . CHF (congestive heart failure) ([place]) 05/21/2019  . Anal cancer ([place]) 02/01/2019  . Mass of anus 01/24/2019    Jule Ser, PT 06/19/2019, 3:05 PM  [facility] [address] [place], [place], [address] Phone: [phone]   Fax:  [phone]  Name: Jacqueline Bird MRN: [id number] Date of Birth: 02-Jul-1950  PHYSICAL THERAPY DISCHARGE SUMMARY  Visits from Start of Care: 3  Current functional level related to goals / functional outcomes: See above detail   Remaining deficits: See above   Education / Equipment: HEP  Plan: Patient agrees to discharge.  Patient goals were not met. Patient is being discharged due to financial reasons.  ?????    [employer], PT 07/17/19 11:27 AM

## 2019-06-21 ENCOUNTER — Telehealth: Payer: Self-pay | Admitting: Cardiology

## 2019-06-21 NOTE — Telephone Encounter (Signed)
Since decrease in medications at the last visit  she has noticing that her feet have started swelling again.

## 2019-06-22 NOTE — Telephone Encounter (Signed)
Reports decreasing lasix 20 mg and Potassium 10 meq too early (06/16/19) vs after 2 weeks of higher dose. Reports swelling in ankles only. Denies sob or chest pain. Reports unchanged dizziness that she's had since starting radiation. Home weight 06/09/2019 was 180 lbs and reports not weighing again until 06/15/2019. See weights below:  06/15/2019 182.6 lbs 06/16/2019 181.6 lbs 06/17/2019 181.2 lbs 06/19/2019 181.8 lbs 06/20/2019 182.0 lbs 06/21/2019 179.8 lbs 06/22/2019 177.0 lbs  Advised to continue with lasix 20 mg and potassium 10 meq, continue consistent daily weights and continue to monitor symptoms. Advised to keep already scheduled f/u on 07/10/2019 with [facility]. Verbalized understanding.

## 2019-06-27 ENCOUNTER — Ambulatory Visit (HOSPITAL_COMMUNITY): Payer: Medicare HMO | Attending: Hematology | Admitting: Physical Therapy

## 2019-06-27 ENCOUNTER — Encounter (INDEPENDENT_AMBULATORY_CARE_PROVIDER_SITE_OTHER): Payer: Self-pay

## 2019-06-27 ENCOUNTER — Other Ambulatory Visit: Payer: Self-pay

## 2019-06-27 ENCOUNTER — Encounter (HOSPITAL_COMMUNITY): Payer: Self-pay | Admitting: Physical Therapy

## 2019-06-27 DIAGNOSIS — M62838 Other muscle spasm: Secondary | ICD-10-CM | POA: Insufficient documentation

## 2019-06-27 DIAGNOSIS — M7918 Myalgia, other site: Secondary | ICD-10-CM | POA: Diagnosis not present

## 2019-06-27 DIAGNOSIS — C210 Malignant neoplasm of anus, unspecified: Secondary | ICD-10-CM | POA: Diagnosis not present

## 2019-06-27 NOTE — Therapy (Signed)
[place], [place], [address] Phone: [phone]   Fax:  [phone]  Physical Therapy Wound Care Evaluation  Patient Details  Name: Jacqueline Bird MRN: [id number] Date of Birth: [birth year] Referring Provider (PT): Derek Jack, MD   Encounter Date: 06/27/2019  PT End of Session - 06/27/19 1508    Visit Number  1    Number of Visits  1    Authorization Type  Aetna Medicare    PT Start Time  1310    PT Stop Time  1345    PT Time Calculation (min)  35 min    Activity Tolerance  Patient tolerated treatment well;Patient limited by fatigue    Behavior During Therapy  [facility] for tasks assessed/performed       Past Medical History:  Diagnosis Date  . 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 Cervical cancer ([place])   . Depression   . Essential hypertension   . History of cervical cancer   . Mixed hyperlipidemia   . Type 2 diabetes mellitus ([place])     Past Surgical History:  Procedure Laterality Date  . CATARACT EXTRACTION, BILATERAL Right 05/2017  . CERVICAL CONE BIOPSY    . HAND AMPUTATION Left   . PORTACATH PLACEMENT Left 02/08/2019   Procedure: INSERTION PORT-A-CATH;  Surgeon: Virl Cagey, MD;  Location: [facility];  Service: General;  Laterality: Left;  . RECTAL BIOPSY  01/26/2019   Procedure: BIOPSY OF ANAL MASS;  Surgeon: Virl Cagey, MD;  Location: [facility];  Service: General;;  . RECTAL EXAM UNDER ANESTHESIA N/A 01/26/2019   Procedure: RECTAL EXAM UNDER ANESTHESIA;  Surgeon: Virl Cagey, MD;  Location: [facility];  Service: General;  Laterality: N/A;    There were no vitals filed for this visit.   Subjective Assessment - 06/27/19 1505    Subjective  Patient complains of soreness and irritation along anus. States she did have large burns from radiation all along her front and back but they have gotten much better  with just some rawness noted along her anus. States she was hospitalized a month ago secondary to fluid overload secondary to heart complications. States she was getting home health and they were helping with her wound care but that had to stop when she started outpatient pelvic floor therapy in [place].    Pertinent History  anal cancer, radiation and chemo treatments, prolongued bedrest, CHF    Limitations  Sitting    Currently in Pain?  Yes    Pain Score  2     Pain Location  --   anus   Pain Descriptors / Indicators  Aching         [facility] PT Assessment - 06/27/19 0001      Assessment   Medical Diagnosis  blisters around anus    Referring Provider (PT)  Derek Jack, [place] residence    Living Arrangements  Children      Prior Function   Level of [place]   Overall Cognitive Status  Within Functional Limits for tasks assessed      Observation/Other Assessments   Skin Integrity  redness and skin irriation noted along anus - no wound noted at this time.  Objective measurements completed on examination: See above findings.              PT Education - 06/27/19 1504    Education Details  on current skin rawness and no current open wound. On proper skin care and appropriate cleansing of area    Person(s) Educated  Patient    Methods  Explanation    Comprehension  Verbalized understanding       PT Short Term Goals - 06/27/19 1503      PT SHORT TERM GOAL #1   Title  Patient will be educated in continued skin care and skin cleansing techniques.    Time  1    Period  Weeks    Status  Achieved    Target Date  06/27/19              Plan - 06/27/19 1358    Clinical Impression Statement  Patient presents to therapy with complaints of skin irritation along anus after undergoing radiation and chemotherapy for anal cancer. Wounds along buttocks  have completely healed at this time. Anus presents with some redness and irritation but no open wound noted. Instructed patient to follow up with MD about possible benefits of cream to put along cleft of buttocks to promote skin healing and prevent maceration of tissue. Patient not appropriate for wound care physical therapy at this time. Patent was educated in proper skin care and cleansing techniques.    Comorbidities  hx of: radiation and chemo treatments    Examination-Activity Limitations  Sit    Examination-Participation Restrictions  Community Activity    Stability/Clinical Decision Making  Stable/Uncomplicated    Clinical Decision Making  Low    Rehab Potential  Excellent    PT Frequency  1x / week    PT Duration  Other (comment)   1 week (one time visit)   PT Treatment/Interventions  ADLs/Self Care Home Management;Patient/family education    PT Next Visit Plan  patient not appropriate for wound care physical therapy. One time visit.    Recommended Other Services  to follow up with MD about cream for raw areas along buttocks cleft    Consulted and Agree with Plan of Care  Patient       Patient will benefit from skilled therapeutic intervention in order to improve the following deficits and impairments:  Pain, Decreased scar mobility, Decreased skin integrity  Visit Diagnosis: Pain in buttock  Anal cancer [facility])     Problem List Patient Active Problem List   Diagnosis Date Noted  . Pleural effusion, right   . Acute diastolic CHF (congestive heart failure) ([place]) 05/23/2019  . Acute respiratory failure with hypoxia ([place]) 05/23/2019  . Hypokalemia 05/23/2019  . Essential hypertension 05/23/2019  . Type 2 diabetes mellitus with other specified complication ([place]) [id number]  . Anxiety 05/23/2019  . CHF (congestive heart failure) ([place]) 05/21/2019  . Anal cancer ([place]) 02/01/2019  . Mass of anus 01/24/2019   3:15 PM, 06/27/19 Jerene Pitch, DPT Physical Therapy with  [facility]  [phone] office  [place] [address] [place], [place], [address] Phone: [phone]   Fax:  [phone]  Name: Jacqueline Bird MRN: [id number] Date of Birth: Feb 25, 1951

## 2019-06-29 ENCOUNTER — Ambulatory Visit: Payer: Medicare HMO | Admitting: General Surgery

## 2019-06-29 ENCOUNTER — Encounter (INDEPENDENT_AMBULATORY_CARE_PROVIDER_SITE_OTHER): Payer: Self-pay

## 2019-06-29 ENCOUNTER — Encounter: Payer: Self-pay | Admitting: General Surgery

## 2019-06-29 ENCOUNTER — Other Ambulatory Visit: Payer: Self-pay

## 2019-06-29 VITALS — BP 111/63 | HR 92 | Temp 97.8°F | Resp 14 | Ht 64.5 in | Wt 183.6 lb

## 2019-06-29 DIAGNOSIS — C210 Malignant neoplasm of anus, unspecified: Secondary | ICD-10-CM

## 2019-06-29 NOTE — Patient Instructions (Signed)
Will discuss plan with Dr. Delton Coombes and Dr. Lisbeth Renshaw. Will go from there.  See Cardiology on 07/10/2019 as scheduled.

## 2019-06-29 NOTE — Progress Notes (Signed)
[facility] Note   HPI:  69 y.o. Female presents to clinic for follow-up evaluation of her anal cancer. She has completed her chemotherapy and radiation treatments. She had issues with radiation and had a lot of burning and pain related to this treatment. She reports that otherwise she is feeling better and having regular Bms. She says her pain and rawness is getting better.   Review of Systems:  Burning pain/ rawness of anal region Minor bleeding Regular Bms  All other review of systems: otherwise negative   Vital Signs:  BP 111/63   Pulse 92   Temp 97.8 F (36.6 C) (Temporal)   Resp 14   Ht 5' 4.5" (1.638 m)   Wt 183 lb 9.6 oz (83.3 kg)   SpO2 97%   BMI 31.03 kg/m    Physical Exam:  Physical Exam Vitals reviewed.  HENT:     Head: Normocephalic.  Cardiovascular:     Rate and Rhythm: Normal rate.  Pulmonary:     Effort: Pulmonary effort is normal.  Abdominal:     General: There is no distension.     Palpations: Abdomen is soft.     Tenderness: There is no abdominal tenderness.  Genitourinary:    Comments: Anterior anal canal with ulcerating mass, stenotic appearing anus, no internal exam performed, perianal skin appearing healthy and ulcerated lesions gone, overall improved from prior  Musculoskeletal:        General: Normal range of motion.  Skin:    General: Skin is warm.  Neurological:     General: No focal deficit present.     Mental Status: She is alert and oriented to person, place, and time.  Psychiatric:        Mood and Affect: Mood normal.        Behavior: Behavior normal.        Thought Content: Thought content normal.        Judgment: Judgment normal.       Assessment:  69 y.o. yo Female with Stage IIIc anal cancer who has completed treatments. She is overall having improvement in her ulcerated areas on her perianal skin but still has obvious mass ulcerating through the skin anteriorly in the anus.  She says she would never want  further radiation treatments. We discussed that she was sent for possible anoscopy but at this time I can see gross disease on the outer edge of the anus so I do not think this is necessary.    Discussed with Dr. Delton Coombes and Dr. Lisbeth Renshaw. Given that it can take 6 months to get full effects of the treatment, will continue to monitor the patient clinically. Dr. Delton Coombes is seeing her in the upcoming week. We discussed possibly getting a PET to assess of metastatic disease.    She may need an anoscopy and biopsy in the future but we will put this on hold for now. If she had residual disease wound need APR and she understands this would mean a permanent colostomy.   She ended her treatments November 2020. I will plan to see her back in April to look at her progress.   Curlene Labrum, MD [facility] [address] [place], [place] [address] [phone] (office)

## 2019-06-30 ENCOUNTER — Other Ambulatory Visit (HOSPITAL_COMMUNITY): Payer: Self-pay | Admitting: *Deleted

## 2019-06-30 DIAGNOSIS — C210 Malignant neoplasm of anus, unspecified: Secondary | ICD-10-CM

## 2019-06-30 NOTE — Progress Notes (Signed)
Orders placed for PET scan per Dr. Delton Coombes.

## 2019-07-03 ENCOUNTER — Encounter: Payer: Medicare HMO | Admitting: Physical Therapy

## 2019-07-05 NOTE — Progress Notes (Signed)
  Radiation Oncology         [phone] ________________________________  Name: Jacqueline Bird MRN: [id number]  Date: 04/07/2019  DOB: 10/20/1950  End of Treatment Note  Diagnosis: anal cancer     Indication for treatment:  curative       Radiation treatment dates:   02/27/19 - 04/07/19  Site/dose:   The patient was treated with a course of IMRT using a simultaneous integrated boost technique. Daily image guidance was using during the treatment. The high dose region received a total of 54 Gy.  Narrative: The patient tolerated radiation treatment relatively well.   The patient experienced skin irritation as expected by the end of treatment.   Plan: The patient has completed radiation treatment. The patient will return to radiation oncology clinic for routine followup in one month. I advised the patient to call or return sooner if they have any questions or concerns related to their recovery or treatment. ________________________________  Jodelle Gross, M.D., Ph.D.

## 2019-07-06 ENCOUNTER — Ambulatory Visit (HOSPITAL_COMMUNITY): Payer: Medicare HMO | Admitting: Hematology

## 2019-07-10 ENCOUNTER — Other Ambulatory Visit: Payer: Self-pay

## 2019-07-10 ENCOUNTER — Encounter: Payer: Self-pay | Admitting: Student

## 2019-07-10 ENCOUNTER — Ambulatory Visit (INDEPENDENT_AMBULATORY_CARE_PROVIDER_SITE_OTHER): Payer: Medicare HMO | Admitting: Student

## 2019-07-10 VITALS — BP 105/57 | HR 87 | Temp 97.4°F | Ht 64.5 in | Wt 182.4 lb

## 2019-07-10 DIAGNOSIS — Z794 Long term (current) use of insulin: Secondary | ICD-10-CM | POA: Diagnosis not present

## 2019-07-10 DIAGNOSIS — E782 Mixed hyperlipidemia: Secondary | ICD-10-CM

## 2019-07-10 DIAGNOSIS — I5031 Acute diastolic (congestive) heart failure: Secondary | ICD-10-CM | POA: Diagnosis not present

## 2019-07-10 DIAGNOSIS — E1169 Type 2 diabetes mellitus with other specified complication: Secondary | ICD-10-CM | POA: Diagnosis not present

## 2019-07-10 MED ORDER — FUROSEMIDE 20 MG PO TABS: 20.0000 mg | ORAL_TABLET | Freq: Every day | ORAL | 1 refills | Status: DC

## 2019-07-10 NOTE — Progress Notes (Signed)
Cardiology Office Note    Date:  07/10/2019   ID:  Jacqueline Bird, DOB 11/29/1950, MRN [id number]  PCP:  Lucia Gaskins, MD  Cardiologist: Rozann Lesches, MD    Chief Complaint  Patient presents with  . Follow-up    1 month visit    History of Present Illness:    Jacqueline Bird is a 69 y.o. 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  She was last examined by Dr. Domenic Polite on 06/09/2019 as a new patient consult after having recently been admitted to [facility] earlier that month for an acute diastolic CHF exacerbation. She had initially required BiPAP and was weaned to nasal cannula. She responded well with IV Lasix and diuresed 17 pounds. Also underwent right thoracentesis on 05/22/2019 with 530 mL of fluid removed and left thoracentesis on 1/7 with 600 cc of fluid removed. Echocardiogram that admission showed an EF of 50 to 55% with basal inferior/inferior lateral HK and grade 1 diastolic dysfunction. She reported overall feeling close to baseline and her dyspnea had improved at the time of her office visit. Weight was stable at 184 lbs and she was continued on Lasix 40 mg daily along with K-dur 20 mEq daily with plans to reduce dosing to 20mg  daily and 10 mEq daily in 2 weeks.   In talking with the patient today, she reports doing well since her last visit. Her pain along her radiation site has continued to improve. She denies any recent dyspnea on exertion, orthopnea, or PND. No recent chest pain or palpitations.  She did notice an indentation from her socks following dose reduction of Lasix and was nervous about recurrent fluid retention. She has been tracking weights at home and says this has overall been stable at 179 - 180 lbs. She does live by herself and reports consuming frozen meals regularly. She does not consume  fast food. She has been trying to decrease her fluid intake as she was previously consuming over a gallon of fluids daily.  Past Medical History:  Diagnosis Date  . 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 Cervical cancer ([place])   . Depression   . Essential hypertension   . History of cervical cancer   . Mixed hyperlipidemia   . Type 2 diabetes mellitus ([place])     Past Surgical History:  Procedure Laterality Date  . CATARACT EXTRACTION, BILATERAL Right 05/2017  . CERVICAL CONE BIOPSY    . HAND AMPUTATION Left   . PORTACATH PLACEMENT Left 02/08/2019   Procedure: INSERTION PORT-A-CATH;  Surgeon: Virl Cagey, MD;  Location: [facility];  Service: General;  Laterality: Left;  . RECTAL BIOPSY  01/26/2019   Procedure: BIOPSY OF ANAL MASS;  Surgeon: Virl Cagey, MD;  Location: [facility];  Service: General;;  . RECTAL EXAM UNDER ANESTHESIA N/A 01/26/2019   Procedure: RECTAL EXAM UNDER ANESTHESIA;  Surgeon: Virl Cagey, MD;  Location: [facility];  Service: General;  Laterality: N/A;    Current Medications: Outpatient Medications Prior to Visit  Medication Sig Dispense Refill  . ALPRAZolam (XANAX) 0.5 MG tablet Take 0.5 mg by mouth at bedtime as needed for anxiety or sleep.    Marland Kitchen aspirin EC 81 MG EC tablet Take 1  tablet (81 mg total) by mouth daily. 30 tablet 0  . gabapentin (NEURONTIN) 100 MG capsule Take 100-200 mg by mouth See admin instructions. Take 200mg  in the AM and 100mg  in the PM.    . potassium chloride SA (KLOR-CON) 20 MEQ tablet Take 10 mEq by mouth daily.    . silver sulfADIAZINE (SILVADENE) 1 % cream Apply to affected area daily (Patient taking differently: Apply 1 application topically 4 (four) times daily. Apply to affected area) 1000 g 3  . simvastatin (ZOCOR) 40 MG tablet Take 40 mg by mouth daily.     . TRESIBA FLEXTOUCH 200 UNIT/ML SOPN Inject 20 Units into the skin daily before breakfast.      . TRINTELLIX 10 MG TABS tablet Take 10 mg by mouth daily.    . TRULICITY A999333 0000000 SOPN Inject 0.75 mg into the muscle every Sunday.     . furosemide (LASIX) 40 MG tablet Take 20 mg by mouth daily.    . furosemide (LASIX) 40 MG tablet Take 1 tablet (40 mg total) by mouth daily. For 2 weeks; then decrease to 20 mg daily (Patient taking differently: Take 20 mg by mouth daily. ) 30 tablet 1  . Potassium Chloride ER 20 MEQ TBCR Take 20 mEq by mouth daily. For 2 weeks; then decrease to 10 meq daily 30 tablet 1   Facility-Administered Medications Prior to Visit  Medication Dose Route Frequency Provider Last Rate Last Admin  . heparin lock flush 100 unit/mL  500 Units Intracatheter Once PRN Derek Jack, MD      . sodium chloride flush (NS) 0.9 % injection 10 mL  10 mL Intracatheter PRN Derek Jack, MD   10 mL at 03/03/19 1152     Allergies:   Sulfa antibiotics   Social History   Socioeconomic History  . Marital status: Single    Spouse name: Not on file  . Number of children: 2  . Years of education: Not on file  . Highest education level: Not on file  Occupational History  . Occupation: retired  Tobacco Use  . Smoking status: Never Smoker  . Smokeless tobacco: Never Used  Substance and Sexual Activity  . Alcohol use: Not Currently  . Drug use: Never  . Sexual activity: Not on file  Other Topics Concern  . Not on file  Social History Narrative   Pt is retired and currently engaged.   Social Determinants of Health   Financial Resource Strain: Medium Risk  . Difficulty of Paying Living Expenses: Somewhat hard  Food Insecurity: No Food Insecurity  . Worried About Charity fundraiser in the Last Year: Never true  . Ran Out of Food in the Last Year: Never true  Transportation Needs: No Transportation Needs  . Lack of Transportation (Medical): No  . Lack of Transportation (Non-Medical): No  Physical Activity: Sufficiently Active  . Days of Exercise per Week:  7 days  . Minutes of Exercise per Session: 60 min  Stress: Stress Concern Present  . Feeling of Stress : To some extent  Social Connections: Slightly Isolated  . Frequency of Communication with Friends and Family: Three times a week  . Frequency of Social Gatherings with Friends and Family: Three times a week  . Attends Religious Services: 1 to 4 times per year  . Active Member of Clubs or Organizations: Yes  . Attends Archivist Meetings: 1 to 4 times per year  . Marital Status: Separated     Family  History:  The patient's family history includes Breast cancer in her niece and niece; Cataracts in her brother, mother, and sister; Diabetes in her brother, father, maternal grandfather, and sister; Glaucoma in her mother; Heart attack in her father; Heart disease in her brother, mother, and sister; Lung disease in her sister; Macular degeneration in her maternal aunt; Throat cancer in her son.   Review of Systems:   Please see the history of present illness.     General:  No chills, fever, night sweats or weight changes.  Cardiovascular:  No chest pain, dyspnea on exertion, orthopnea, palpitations, paroxysmal nocturnal dyspnea. Positive for edema (improved).  Dermatological: No rash, lesions/masses Respiratory: No cough, dyspnea Urologic: No hematuria, dysuria Abdominal:   No nausea, vomiting, diarrhea, bright red blood per rectum, melena, or hematemesis Neurologic:  No visual changes, wkns, changes in mental status. All other systems reviewed and are otherwise negative except as noted above.   Physical Exam:    VS:  BP (!) 105/57   Pulse 87   Temp (!) 97.4 F (36.3 C)   Ht 5' 4.5" (1.638 m)   Wt 182 lb 6.4 oz (82.7 kg)   SpO2 98%   BMI 30.83 kg/m    General: Well developed, well nourished,female appearing in no acute distress. Head: Normocephalic, atraumatic, sclera non-icteric.  Neck: No carotid bruits. JVD not elevated.  Lungs: Respirations regular and unlabored,  without wheezes or rales.  Heart: Regular rate and rhythm. No S3 or S4.  No murmur, no rubs, or gallops appreciated. Abdomen: Soft, non-tender, non-distended. No obvious abdominal masses. Msk:  Strength and tone appear normal for age. No obvious joint deformities or effusions. Extremities: No clubbing or cyanosis. Trace ankle edema bilaterally.  Distal pedal pulses are 2+ bilaterally. Left hand amputation.  Neuro: Alert and oriented X 3. Moves all extremities spontaneously. No focal deficits noted. Psych:  Responds to questions appropriately with a normal affect. Skin: No rashes or lesions noted  Wt Readings from Last 3 Encounters:  07/10/19 182 lb 6.4 oz (82.7 kg)  06/29/19 183 lb 9.6 oz (83.3 kg)  06/09/19 184 lb 12.8 oz (83.8 kg)     Studies/Labs Reviewed:   EKG:  EKG is not ordered today.   Recent Labs: 05/21/2019: B Natriuretic Peptide 683.0 05/22/2019: ALT 13; TSH 1.461 05/23/2019: Hemoglobin 9.9; Magnesium 1.8; Platelets 280 05/27/2019: BUN 21; Creatinine, Ser 0.72; Potassium 4.7; Sodium 136   Lipid Panel No results found for: CHOL, TRIG, HDL, CHOLHDL, VLDL, LDLCALC, LDLDIRECT  Additional studies/ records that were reviewed today include:   Echocardiogram: 05/22/2019 IMPRESSIONS    1. Left ventricular ejection fraction, by visual estimation, is 50 to  55%. The left ventricle has low normal function. There is no left  ventricular hypertrophy.  2. Basal inferolateral segment and basal inferior segment are abnormal.  3. Left ventricular diastolic parameters are consistent with Grade I  diastolic dysfunction (impaired relaxation).  4. The left ventricle demonstrates regional wall motion abnormalities.  5. Global right ventricle has normal systolic function.The right  ventricular size is normal. No increase in right ventricular wall  thickness.  6. Left atrial size was moderately dilated.  7. Right atrial size was normal.  8. The mitral valve is grossly normal. Mild  mitral valve regurgitation.  9. The tricuspid valve is grossly normal.  10. The aortic valve is tricuspid. Aortic valve regurgitation is not  visualized.  11. The pulmonic valve was grossly normal. Pulmonic valve regurgitation is  trivial.  12. Mildly elevated  pulmonary artery systolic pressure.  13. The tricuspid regurgitant velocity is 2.30 m/s, and with an assumed  right atrial pressure of 15 mmHg, the estimated right ventricular systolic  pressure is mildly elevated at 36.2 mmHg.  14. The inferior vena cava is dilated in size with <50% respiratory  variability, suggesting right atrial pressure of 15 mmHg.   Assessment:    1. Acute diastolic heart failure ([place])   2. Mixed hyperlipidemia   3. Type 2 diabetes mellitus with other specified complication, with long-term current use of insulin ([place])      Plan:   In order of problems listed above:  1. Acute Diastolic CHF - She was hospitalized for an acute CHF exacerbation in 05/2019 and echocardiogram showed an EF of 50 to 55% with basal inferior/inferior lateral HK and Grade 1 DD.  - Her breathing has overall been stable and weight has actually declined on the office scales. Will continue Lasix at current dosing of 20 mg daily and request most recent labs from her PCP. If renal function has worsened, would transition Lasix to 20 mg every other day. She does pay close attention to her lower extremity edema and I informed her she could take an extra 20 mg tablet as needed for worsening edema or weight gain greater than 3 pounds overnight or 5 pounds in 1 week. Given improvement in her symptoms, further cardiac testing was not ordered today.  - She was encouraged to limit her fluid intake to less than 2 L daily and sodium intake to less than 2000 mg daily.    2. HLD - followed by PCP. Will request most recent labs. She remains on Simvastatin 40mg  daily.   3. IDDM - Hgb A1c well-controlled at 5.7 when checked during her admission in  05/2019. Rechecked by her PCP last week and will request records.    Medication Adjustments/Labs and Tests Ordered: Current medicines are reviewed at length with the patient today.  Concerns regarding medicines are outlined above.  Medication changes, Labs and Tests ordered today are listed in the Patient Instructions below. Patient Instructions   Medication Instructions:  Your physician recommends that you continue on your current medications as directed. Please refer to the Current Medication list given to you today.  *If you need a refill on your cardiac medications before your next appointment, please call your pharmacy*  Lab Work: NONE  If you have labs (blood work) drawn today and your tests are completely normal, you will receive your results only by: Marland Kitchen MyChart Message (if you have MyChart) OR . A paper copy in the mail If you have any lab test that is abnormal or we need to change your treatment, we will call you to review the results.  Testing/Procedures: NONE   Follow-Up: At [facility], you and your health needs are our priority.  As part of our continuing mission to provide you with exceptional heart care, we have created designated Provider Care Teams.  These Care Teams include your primary Cardiologist (physician) and Advanced Practice Providers (APPs -  Physician Assistants and Nurse Practitioners) who all work together to provide you with the care you need, when you need it.  Your next appointment:   3 month(s)  The format for your next appointment:   In Person  Provider:   Rozann Lesches, MD  Other Instructions Thank you for choosing [facility]!    Limit daily fluid intake to less than 2 Liters per day. Please limit salt intake.  Please weight yourself every morning. Take an extra Lasix 20mg  if weight increases by 3 pounds overnight or 5 pounds in a single week.     Two Gram Sodium Diet 2000 mg  What is Sodium? Sodium is a mineral found  naturally in many foods. The most significant source of sodium in the diet is table salt, which is about 40% sodium.  Processed, convenience, and preserved foods also contain a large amount of sodium.  The body needs only 500 mg of sodium daily to function,  A normal diet provides more than enough sodium even if you do not use salt.  Why Limit Sodium? A build up of sodium in the body can cause thirst, increased blood pressure, shortness of breath, and water retention.  Decreasing sodium in the diet can reduce edema and risk of heart attack or stroke associated with high blood pressure.  Keep in mind that there are many other factors involved in these health problems.  Heredity, obesity, lack of exercise, cigarette smoking, stress and what you eat all play a role.  General Guidelines:  Do not add salt at the table or in cooking.  One teaspoon of salt contains over 2 grams of sodium.  Read food labels  Avoid processed and convenience foods  Ask your dietitian before eating any foods not dicussed in the menu planning guidelines  Consult your physician if you wish to use a salt substitute or a sodium containing medication such as antacids.  Limit milk and milk products to 16 oz (2 cups) per day.  Shopping Hints:  READ LABELS!! "Dietetic" does not necessarily mean low sodium.  Salt and other sodium ingredients are often added to foods during processing.   Menu Planning Guidelines Food Group Choose More Often Avoid  Beverages (see also the milk group All fruit juices, low-sodium, salt-free vegetables juices, low-sodium carbonated beverages Regular vegetable or tomato juices, commercially softened water used for drinking or cooking  Breads and Cereals Enriched white, wheat, rye and pumpernickel bread, hard rolls and dinner rolls; muffins, cornbread and waffles; most dry cereals, cooked cereal without added salt; unsalted crackers and breadsticks; low sodium or homemade bread crumbs Bread, rolls  and crackers with salted tops; quick breads; instant hot cereals; pancakes; commercial bread stuffing; self-rising flower and biscuit mixes; regular bread crumbs or cracker crumbs  Desserts and Sweets Desserts and sweets mad with mild should be within allowance Instant pudding mixes and cake mixes  Fats Butter or margarine; vegetable oils; unsalted salad dressings, regular salad dressings limited to 1 Tbs; light, sour and heavy cream Regular salad dressings containing bacon fat, bacon bits, and salt pork; snack dips made with instant soup mixes or processed cheese; salted nuts  Fruits Most fresh, frozen and canned fruits Fruits processed with salt or sodium-containing ingredient (some dried fruits are processed with sodium sulfites        Vegetables Fresh, frozen vegetables and low- sodium canned vegetables Regular canned vegetables, sauerkraut, pickled vegetables, and others prepared in brine; frozen vegetables in sauces; vegetables seasoned with ham, bacon or salt pork  Condiments, Sauces, Miscellaneous  Salt substitute with physician's approval; pepper, herbs, spices; vinegar, lemon or lime juice; hot pepper sauce; garlic powder, onion powder, low sodium soy sauce (1 Tbs.); low sodium condiments (ketchup, chili sauce, mustard) in limited amounts (1 tsp.) fresh ground horseradish; unsalted tortilla chips, pretzels, potato chips, popcorn, salsa (1/4 cup) Any seasoning made with salt including garlic salt, celery salt, onion salt, and seasoned salt; sea salt, rock  salt, kosher salt; meat tenderizers; monosodium glutamate; mustard, regular soy sauce, barbecue, sauce, chili sauce, teriyaki sauce, steak sauce, Worcestershire sauce, and most flavored vinegars; canned gravy and mixes; regular condiments; salted snack foods, olives, picles, relish, horseradish sauce, catsup   Food preparation: Try these seasonings Meats:    Pork Sage, onion Serve with applesauce  Chicken Poultry seasoning, thyme, parsley  Serve with cranberry sauce  Lamb Curry powder, rosemary, garlic, thyme Serve with mint sauce or jelly  Veal Marjoram, basil Serve with current jelly, cranberry sauce  Beef Pepper, bay leaf Serve with dry mustard, unsalted chive butter  Fish [place] leaf, dill Serve with unsalted lemon butter, unsalted parsley butter  Vegetables:    Asparagus Lemon juice   Broccoli Lemon juice   Carrots Mustard dressing parsley, mint, nutmeg, glazed with unsalted butter and sugar   Green beans Marjoram, lemon juice, nutmeg,dill seed   Tomatoes Basil, marjoram, onion   Spice /blend for [facility]" 4 tsp ground thyme 1 tsp ground sage 3 tsp ground rosemary 4 tsp ground marjoram     Signed, Erma Heritage, PA-C  07/10/2019 6:40 PM    [facility] [address] [place], [place] [address] Phone: [phone] Fax: [phone]

## 2019-07-10 NOTE — Patient Instructions (Addendum)
Medication Instructions:  Your physician recommends that you continue on your current medications as directed. Please refer to the Current Medication list given to you today.  *If you need a refill on your cardiac medications before your next appointment, please call your pharmacy*  Lab Work: NONE  If you have labs (blood work) drawn today and your tests are completely normal, you will receive your results only by: Marland Kitchen MyChart Message (if you have MyChart) OR . A paper copy in the mail If you have any lab test that is abnormal or we need to change your treatment, we will call you to review the results.  Testing/Procedures: NONE   Follow-Up: At [facility], you and your health needs are our priority.  As part of our continuing mission to provide you with exceptional heart care, we have created designated Provider Care Teams.  These Care Teams include your primary Cardiologist (physician) and Advanced Practice Providers (APPs -  Physician Assistants and Nurse Practitioners) who all work together to provide you with the care you need, when you need it.  Your next appointment:   3 month(s)  The format for your next appointment:   In Person  Provider:   Rozann Lesches, MD  Other Instructions Thank you for choosing [facility]!    Limit daily fluid intake to less than 2 Liters per day. Please limit salt intake.  Please weight yourself every morning. Take an extra Lasix 20mg  if weight increases by 3 pounds overnight or 5 pounds in a single week.     Two Gram Sodium Diet 2000 mg  What is Sodium? Sodium is a mineral found naturally in many foods. The most significant source of sodium in the diet is table salt, which is about 40% sodium.  Processed, convenience, and preserved foods also contain a large amount of sodium.  The body needs only 500 mg of sodium daily to function,  A normal diet provides more than enough sodium even if you do not use salt.  Why Limit  Sodium? A build up of sodium in the body can cause thirst, increased blood pressure, shortness of breath, and water retention.  Decreasing sodium in the diet can reduce edema and risk of heart attack or stroke associated with high blood pressure.  Keep in mind that there are many other factors involved in these health problems.  Heredity, obesity, lack of exercise, cigarette smoking, stress and what you eat all play a role.  General Guidelines:  Do not add salt at the table or in cooking.  One teaspoon of salt contains over 2 grams of sodium.  Read food labels  Avoid processed and convenience foods  Ask your dietitian before eating any foods not dicussed in the menu planning guidelines  Consult your physician if you wish to use a salt substitute or a sodium containing medication such as antacids.  Limit milk and milk products to 16 oz (2 cups) per day.  Shopping Hints:  READ LABELS!! "Dietetic" does not necessarily mean low sodium.  Salt and other sodium ingredients are often added to foods during processing.   Menu Planning Guidelines Food Group Choose More Often Avoid  Beverages (see also the milk group All fruit juices, low-sodium, salt-free vegetables juices, low-sodium carbonated beverages Regular vegetable or tomato juices, commercially softened water used for drinking or cooking  Breads and Cereals Enriched white, wheat, rye and pumpernickel bread, hard rolls and dinner rolls; muffins, cornbread and waffles; most dry cereals, cooked cereal without added salt;  unsalted crackers and breadsticks; low sodium or homemade bread crumbs Bread, rolls and crackers with salted tops; quick breads; instant hot cereals; pancakes; commercial bread stuffing; self-rising flower and biscuit mixes; regular bread crumbs or cracker crumbs  Desserts and Sweets Desserts and sweets mad with mild should be within allowance Instant pudding mixes and cake mixes  Fats Butter or margarine; vegetable oils;  unsalted salad dressings, regular salad dressings limited to 1 Tbs; light, sour and heavy cream Regular salad dressings containing bacon fat, bacon bits, and salt pork; snack dips made with instant soup mixes or processed cheese; salted nuts  Fruits Most fresh, frozen and canned fruits Fruits processed with salt or sodium-containing ingredient (some dried fruits are processed with sodium sulfites        Vegetables Fresh, frozen vegetables and low- sodium canned vegetables Regular canned vegetables, sauerkraut, pickled vegetables, and others prepared in brine; frozen vegetables in sauces; vegetables seasoned with ham, bacon or salt pork  Condiments, Sauces, Miscellaneous  Salt substitute with physician's approval; pepper, herbs, spices; vinegar, lemon or lime juice; hot pepper sauce; garlic powder, onion powder, low sodium soy sauce (1 Tbs.); low sodium condiments (ketchup, chili sauce, mustard) in limited amounts (1 tsp.) fresh ground horseradish; unsalted tortilla chips, pretzels, potato chips, popcorn, salsa (1/4 cup) Any seasoning made with salt including garlic salt, celery salt, onion salt, and seasoned salt; sea salt, rock salt, kosher salt; meat tenderizers; monosodium glutamate; mustard, regular soy sauce, barbecue, sauce, chili sauce, teriyaki sauce, steak sauce, Worcestershire sauce, and most flavored vinegars; canned gravy and mixes; regular condiments; salted snack foods, olives, picles, relish, horseradish sauce, catsup   Food preparation: Try these seasonings Meats:    Pork Sage, onion Serve with applesauce  Chicken Poultry seasoning, thyme, parsley Serve with cranberry sauce  Lamb Curry powder, rosemary, garlic, thyme Serve with mint sauce or jelly  Veal Marjoram, basil Serve with current jelly, cranberry sauce  Beef Pepper, bay leaf Serve with dry mustard, unsalted chive butter  Fish [place] leaf, dill Serve with unsalted lemon butter, unsalted parsley butter  Vegetables:     Asparagus Lemon juice   Broccoli Lemon juice   Carrots Mustard dressing parsley, mint, nutmeg, glazed with unsalted butter and sugar   Green beans Marjoram, lemon juice, nutmeg,dill seed   Tomatoes Basil, marjoram, onion   Spice /blend for [facility]" 4 tsp ground thyme 1 tsp ground sage 3 tsp ground rosemary 4 tsp ground marjoram

## 2019-07-11 ENCOUNTER — Encounter: Payer: Medicare HMO | Admitting: Physical Therapy

## 2019-07-11 ENCOUNTER — Other Ambulatory Visit (HOSPITAL_COMMUNITY): Payer: Self-pay | Admitting: *Deleted

## 2019-07-11 MED ORDER — ALPRAZOLAM 0.5 MG PO TABS: 0.5000 mg | ORAL_TABLET | Freq: Every evening | ORAL | 1 refills | Status: DC | PRN

## 2019-07-12 ENCOUNTER — Encounter: Payer: Self-pay | Admitting: *Deleted

## 2019-07-12 ENCOUNTER — Telehealth: Payer: Self-pay | Admitting: Cardiology

## 2019-07-12 NOTE — Telephone Encounter (Signed)
Left message to return call.   Did not find any notes that someone had called her.

## 2019-07-12 NOTE — Telephone Encounter (Signed)
Returning call.

## 2019-07-13 ENCOUNTER — Encounter: Payer: Self-pay | Admitting: Cardiology

## 2019-07-17 ENCOUNTER — Other Ambulatory Visit: Payer: Self-pay

## 2019-07-17 ENCOUNTER — Ambulatory Visit (HOSPITAL_COMMUNITY)
Admission: RE | Admit: 2019-07-17 | Discharge: 2019-07-17 | Disposition: A | Discharge status: Home / Self Care | Payer: Medicare HMO | Source: Ambulatory Visit | Attending: Hematology | Admitting: Hematology

## 2019-07-17 ENCOUNTER — Encounter: Payer: Medicare HMO | Admitting: Physical Therapy

## 2019-07-17 DIAGNOSIS — C210 Malignant neoplasm of anus, unspecified: Secondary | ICD-10-CM | POA: Diagnosis present

## 2019-07-17 MED ORDER — FLUDEOXYGLUCOSE F - 18 (FDG) INJECTION
11.2000 | Freq: Once | INTRAVENOUS | Status: AC | PRN
  Administered 2019-07-17: 20:00:00 11.2 via INTRAVENOUS

## 2019-07-19 ENCOUNTER — Other Ambulatory Visit: Payer: Self-pay

## 2019-07-19 ENCOUNTER — Inpatient Hospital Stay (HOSPITAL_COMMUNITY): Payer: Medicare HMO | Attending: Hematology | Admitting: Hematology

## 2019-07-19 ENCOUNTER — Encounter (HOSPITAL_COMMUNITY): Payer: Self-pay | Admitting: Hematology

## 2019-07-19 VITALS — BP 100/54 | HR 95 | Temp 97.1°F | Resp 18 | Wt 183.7 lb

## 2019-07-19 DIAGNOSIS — R159 Full incontinence of feces: Secondary | ICD-10-CM | POA: Insufficient documentation

## 2019-07-19 DIAGNOSIS — R200 Anesthesia of skin: Secondary | ICD-10-CM | POA: Insufficient documentation

## 2019-07-19 DIAGNOSIS — G4700 Insomnia, unspecified: Secondary | ICD-10-CM | POA: Diagnosis not present

## 2019-07-19 DIAGNOSIS — E785 Hyperlipidemia, unspecified: Secondary | ICD-10-CM | POA: Insufficient documentation

## 2019-07-19 DIAGNOSIS — Z7982 Long term (current) use of aspirin: Secondary | ICD-10-CM | POA: Diagnosis not present

## 2019-07-19 DIAGNOSIS — I10 Essential (primary) hypertension: Secondary | ICD-10-CM | POA: Diagnosis not present

## 2019-07-19 DIAGNOSIS — Z7901 Long term (current) use of anticoagulants: Secondary | ICD-10-CM | POA: Diagnosis not present

## 2019-07-19 DIAGNOSIS — E119 Type 2 diabetes mellitus without complications: Secondary | ICD-10-CM | POA: Insufficient documentation

## 2019-07-19 DIAGNOSIS — C210 Malignant neoplasm of anus, unspecified: Secondary | ICD-10-CM | POA: Diagnosis present

## 2019-07-19 DIAGNOSIS — Z803 Family history of malignant neoplasm of breast: Secondary | ICD-10-CM | POA: Insufficient documentation

## 2019-07-19 DIAGNOSIS — R51 Headache with orthostatic component, not elsewhere classified: Secondary | ICD-10-CM | POA: Diagnosis not present

## 2019-07-19 DIAGNOSIS — Z79899 Other long term (current) drug therapy: Secondary | ICD-10-CM | POA: Diagnosis not present

## 2019-07-19 DIAGNOSIS — F329 Major depressive disorder, single episode, unspecified: Secondary | ICD-10-CM | POA: Diagnosis not present

## 2019-07-19 DIAGNOSIS — Z801 Family history of malignant neoplasm of trachea, bronchus and lung: Secondary | ICD-10-CM | POA: Insufficient documentation

## 2019-07-19 NOTE — Assessment & Plan Note (Addendum)
1.  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 -We reviewed PET scan from 07/17/2019.  It showed complete resolution of bilateral inguinal and right external iliac lymph node metastasis.  Primary anal tumor has significantly decreased in size with mild activity.  There is no adenopathy in the axillary region or subpectoral region. -She was recently evaluated by Dr. Constance Haw and anal ulceration has improved significantly. -I have not recommended any biopsy of the lesion at this time.  She reported some incontinence on the day of her bowel movement.  She usually has bowel movement once every 2 to 3 days.  We will plan to repeat PET scan in 3 months.  If there is any lesion still persistent, will consider biopsy at that time.  2.  Perianal pain: -This has completely subsided.  She is not requiring any pain medication.  3.  Difficulty falling asleep: -She is taking Xanax 0.5 mg at bedtime which is not helping. -I have told her to increase it to 1 mg at bedtime.  If it works, I will send her prescription for 1 mg.  If not we will consider alternative agents.  4.  Diabetes: -She will continue [place].

## 2019-07-19 NOTE — Patient Instructions (Signed)
[place] at [facility] Discharge Instructions  You were seen today by Dr. Delton Coombes. He went over your recent lab and scan results. Try taking 2 tablets of xanax at bedtime to help with sleep. He will see you back in 3 month for labs, PET scan and follow up.   Thank you for choosing [place] at [facility] to provide your oncology and hematology care.  To afford each patient quality time with our provider, please arrive at least 15 minutes before your scheduled appointment time.   If you have a lab appointment with the [facility] please come in thru the  Main Entrance and check in at the main information desk  You need to re-schedule your appointment should you arrive 10 or more minutes late.  We strive to give you quality time with our providers, and arriving late affects you and other patients whose appointments are after yours.  Also, if you no show three or more times for appointments you may be dismissed from the clinic at the providers discretion.     Again, thank you for choosing [facility].  Our hope is that these requests will decrease the amount of time that you wait before being seen by our physicians.       _____________________________________________________________  Should you have questions after your visit to [facility], please contact our office at [phone] between the hours of 8:00 a.m. and 4:30 p.m.  Voicemails left after 4:00 p.m. will not be returned until the following business day.  For prescription refill requests, have your pharmacy contact our office and allow 72 hours.    Cancer Center Support Programs:   > Cancer Support Group  2nd Tuesday of the month 1pm-2pm, Journey Room

## 2019-07-19 NOTE — Progress Notes (Signed)
[address] [place], [place] [address]   CLINIC:  Medical Oncology/Hematology  PCP:  Lucia Gaskins, [place] [id number] [phone]   REASON FOR VISIT:  Follow-up for anal cancer.  CURRENT THERAPY: Observation.  BRIEF ONCOLOGIC HISTORY:  Oncology History  Anal cancer ([place])  02/01/2019 Initial Diagnosis   Anal cancer ([place])   02/16/2019 Cancer Staging   Staging form: Anus, AJCC 8th Edition - Clinical: Stage IIIC (cT3, cN1c, cM0) - Signed by Kyung Rudd, MD on 02/16/2019   03/03/2019 -  Chemotherapy   The patient had mitoMYcin (MUTAMYCIN) chemo injection 20 mg, 9.7 mg/m2 = 21 mg, Intravenous,  Once, 1 of 1 cycle Dose modification: 8 mg/m2 (original dose 10 mg/m2, Cycle 1, Reason: Provider Judgment) Administration: 20 mg (03/03/2019), 16.5 mg (03/31/2019) fluorouracil (ADRUCIL) 8,300 mg in sodium chloride 0.9 % 84 mL chemo infusion, 1,000 mg/m2/day = 8,300 mg, Intravenous, 4D (96 hours ), 1 of 1 cycle Administration: 8,300 mg (03/03/2019), 8,300 mg (03/31/2019)  for chemotherapy treatment.       CANCER STAGING: Cancer Staging Anal cancer ([place]) Staging form: Anus, AJCC 8th Edition - Clinical: Stage IIIC (cT3, cN1c, cM0) - Signed by Kyung Rudd, MD on 02/16/2019    INTERVAL HISTORY:  Ms. Grauman 69 y.o. female seen for follow-up of anal cancer.  Appetite is 100%.  Energy levels are 50%.  She is accompanied by her daughter.  Numbness in the right hand is stable.  Chronic headaches are also stable.  Complains of unable to sleep.  She is currently taking Xanax 0.5 mg at bedtime.  She does not report any pains in the anal region.  She does report some incontinence on the day of bowel movement.    REVIEW OF SYSTEMS:  Review of Systems  Neurological: Positive for numbness.  Psychiatric/Behavioral: Positive for sleep disturbance.  All other systems reviewed and are negative.    PAST MEDICAL/SURGICAL HISTORY:  Past  Medical History:  Diagnosis Date  . 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 Anal cancer ([place])    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  . Cervical cancer ([place])   . Depression   . Essential hypertension   . History of cervical cancer   . Mixed hyperlipidemia   . Type 2 diabetes mellitus ([place])    Past Surgical History:  Procedure Laterality Date  . CATARACT EXTRACTION, BILATERAL Right 05/2017  . CERVICAL CONE BIOPSY    . HAND AMPUTATION Left   . PORTACATH PLACEMENT Left 02/08/2019   Procedure: INSERTION PORT-A-CATH;  Surgeon: Virl Cagey, MD;  Location: [facility];  Service: General;  Laterality: Left;  . RECTAL BIOPSY  01/26/2019   Procedure: BIOPSY OF ANAL MASS;  Surgeon: Virl Cagey, MD;  Location: [facility] ORS;  Service: General;;  . RECTAL EXAM UNDER ANESTHESIA N/A 01/26/2019   Procedure: RECTAL EXAM UNDER ANESTHESIA;  Surgeon: Virl Cagey, MD;  Location: [facility] OR[facility];  Service: General;  Laterality: N/A;     SOCIAL HISTORY:  Social History   Socioeconomic History  . Marital status: Single    Spouse name: Not on file  . Number of children: 2  . Years of education: Not on file  . Highest education level: Not on file  Occupational History  . Occupation: retired  Tobacco Use  . Smoking status: Never Smoker  . Smokeless tobacco: Never Used  Substance and Sexual Activity  . Alcohol use: Not Currently  .  Drug use: Never  . Sexual activity: Not on file  Other Topics Concern  . Not on file  Social History Narrative   Pt is retired and currently engaged.   Social Determinants of Health   Financial Resource Strain: Medium Risk  . Difficulty of Paying Living Expenses: Somewhat hard  Food Insecurity: No Food Insecurity  . Worried About Charity fundraiser in the Last Year: Never true  . Ran Out of Food in the Last Year: Never true  Transportation Needs: No Transportation Needs  . Lack of Transportation (Medical): No  . Lack of Transportation  (Non-Medical): No  Physical Activity: Sufficiently Active  . Days of Exercise per Week: 7 days  . Minutes of Exercise per Session: 60 min  Stress: Stress Concern Present  . Feeling of Stress : To some extent  Social Connections: Slightly Isolated  . Frequency of Communication with Friends and Family: Three times a week  . Frequency of Social Gatherings with Friends and Family: Three times a week  . Attends Religious Services: 1 to 4 times per year  . Active Member of Clubs or Organizations: Yes  . Attends Archivist Meetings: 1 to 4 times per year  . Marital Status: Separated  Intimate Partner Violence: Not At Risk  . Fear of Current or Ex-Partner: No  . Emotionally Abused: No  . Physically Abused: No  . Sexually Abused: No    FAMILY HISTORY:  Family History  Problem Relation Age of Onset  . Cataracts Mother   . Glaucoma Mother   . Heart disease Mother   . Diabetes Father   . Heart attack Father   . Cataracts Sister   . Lung disease Sister   . Cataracts Brother   . Diabetes Brother   . Heart disease Brother   . Macular degeneration Maternal Aunt   . Diabetes Maternal Grandfather   . Heart disease Sister   . Diabetes Sister   . Throat cancer Son   . Breast cancer Niece   . Breast cancer Niece   . Amblyopia Neg Hx   . Blindness Neg Hx   . Retinal detachment Neg Hx   . Strabismus Neg Hx   . Retinitis pigmentosa Neg Hx     CURRENT MEDICATIONS:  Outpatient Encounter Medications as of 07/19/2019  Medication Sig Note  . aspirin EC 81 MG EC tablet Take 1 tablet (81 mg total) by mouth daily. 06/09/2019: Been out recently.  . gabapentin (NEURONTIN) 100 MG capsule Take 100-200 mg by mouth See admin instructions. Take 200mg  in the AM and 100mg  in the PM.   . simvastatin (ZOCOR) 40 MG tablet Take 40 mg by mouth daily.  06/09/2019: Not sure if she should be on.   . TRESIBA FLEXTOUCH 200 UNIT/ML SOPN Inject 20 Units into the skin daily before breakfast.   . TRINTELLIX 10  MG TABS tablet Take 10 mg by mouth daily.   . TRULICITY [id number] [id number] SOPN Inject 0.75 mg into the muscle every Sunday.    . ALPRAZolam (XANAX) 0.5 MG tablet Take 1 tablet (0.5 mg total) by mouth at bedtime as needed for anxiety or sleep. (Patient not taking: Reported on 07/19/2019)   . furosemide (LASIX) 20 MG tablet Take 1 tablet (20 mg total) by mouth daily. Can take an extra tablet for weight gain or edema. (Patient not taking: Reported on 07/19/2019)   . silver sulfADIAZINE (SILVADENE) 1 % cream Apply to affected area daily (Patient not taking: Reported on  07/19/2019)   . [DISCONTINUED] potassium chloride SA (KLOR-CON) 20 MEQ tablet Take 10 mEq by mouth daily.    Facility-Administered Encounter Medications as of 07/19/2019  Medication  . heparin lock flush 100 unit/mL  . sodium chloride flush (NS) 0.9 % injection 10 mL    ALLERGIES:  Allergies  Allergen Reactions  . Sulfa Antibiotics Anaphylaxis and Swelling    Per pt, can use creams with sulfa in it     PHYSICAL EXAM:  ECOG Performance status: 1  Vitals:   07/19/19 1505  BP: (!) 100/54  Pulse: 95  Resp: 18  Temp: (!) 97.1 F (36.2 C)  SpO2: 100%   Filed Weights   07/19/19 1505  Weight: 183 lb 11.2 oz (83.3 kg)    Physical Exam Vitals reviewed.  Constitutional:      Appearance: Normal appearance.  Cardiovascular:     Rate and Rhythm: Normal rate and regular rhythm.     Heart sounds: Normal heart sounds.  Pulmonary:     Effort: Pulmonary effort is normal.     Breath sounds: Normal breath sounds.  Abdominal:     General: There is no distension.     Palpations: Abdomen is soft. There is no mass.  Musculoskeletal:        General: No swelling.  Skin:    General: Skin is warm.  Neurological:     General: No focal deficit present.     Mental Status: She is alert and oriented to person, place, and time.  Psychiatric:        Mood and Affect: Mood normal.        Behavior: Behavior normal.        LABORATORY DATA:    I have reviewed the labs as listed.  CBC    Component Value Date/Time   WBC 4.4 05/23/2019 0755   RBC 3.24 (L) 05/23/2019 0755   HGB 9.9 (L) 05/23/2019 0755   HCT 32.8 (L) 05/23/2019 0755   PLT 280 05/23/2019 0755   MCV 101.2 (H) 05/23/2019 0755   MCH 30.6 05/23/2019 0755   MCHC 30.2 05/23/2019 0755   RDW 16.8 (H) 05/23/2019 0755   LYMPHSABS 1.3 05/22/2019 0425   MONOABS 1.0 05/22/2019 0425   EOSABS 0.1 05/22/2019 0425   BASOSABS 0.0 05/22/2019 0425   CMP Latest Ref Rng & Units 05/27/2019 05/26/2019 05/25/2019  Glucose 70 - 99 mg/dL 138(H) 112(H) 110(H)  BUN 8 - 23 mg/dL 21 12 14   Creatinine 0.44 - 1.00 mg/dL 0.72 0.54 0.58  Sodium 135 - 145 mmol/L 136 135 135  Potassium 3.5 - 5.1 mmol/L 4.7 3.7 4.5  Chloride 98 - 111 mmol/L 98 99 101  CO2 22 - 32 mmol/L 31 27 26   Calcium 8.9 - 10.3 mg/dL 8.6(L) 8.5(L) 8.2(L)  Total Protein 6.5 - 8.1 g/dL - - -  Total Bilirubin 0.3 - 1.2 mg/dL - - -  Alkaline Phos 38 - 126 U/L - - -  AST 15 - 41 U/L - - -  ALT 0 - 44 U/L - - -       DIAGNOSTIC IMAGING:  I have independently reviewed the scans and discussed with the patient.     ASSESSMENT & PLAN:   Anal cancer ([place]) 1.  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 -We reviewed PET scan from 07/17/2019.  It showed complete resolution of bilateral inguinal and right external iliac lymph node metastasis.  Primary anal  tumor has significantly decreased in size with mild activity.  There is no adenopathy in the axillary region or subpectoral region. -She was recently evaluated by Dr. Constance Haw and anal ulceration has improved significantly. -I have not recommended any biopsy of the lesion at this time.  She reported some incontinence on the day of her bowel movement.  She usually has bowel movement once every 2 to 3 days.  We will plan to repeat PET scan in 3 months.  If there is any lesion still persistent, will consider  biopsy at that time.  2.  Perianal pain: -This has completely subsided.  She is not requiring any pain medication.  3.  Difficulty falling asleep: -She is taking Xanax 0.5 mg at bedtime which is not helping. -I have told her to increase it to 1 mg at bedtime.  If it works, I will send her prescription for 1 mg.  If not we will consider alternative agents.  4.  Diabetes: -She will continue [place].     Orders placed this encounter:  Orders Placed This Encounter  Procedures  . NM PET Image Restag (PS) Skull Base To Thigh  . CBC with Differential/Platelet  . Comprehensive metabolic panel      Derek Jack, MD [place] [phone]

## 2019-07-24 ENCOUNTER — Encounter: Payer: Medicare HMO | Admitting: Physical Therapy

## 2019-07-31 ENCOUNTER — Encounter: Payer: Medicare HMO | Admitting: Physical Therapy

## 2019-08-07 ENCOUNTER — Encounter: Payer: Medicare HMO | Admitting: Physical Therapy

## 2019-08-14 ENCOUNTER — Ambulatory Visit: Payer: Medicare HMO | Admitting: Physical Therapy

## 2019-09-06 ENCOUNTER — Other Ambulatory Visit (HOSPITAL_COMMUNITY): Payer: Self-pay | Admitting: *Deleted

## 2019-09-06 MED ORDER — ALPRAZOLAM 0.5 MG PO TABS: 0.5000 mg | ORAL_TABLET | Freq: Every evening | ORAL | 1 refills | Status: DC | PRN

## 2019-10-06 ENCOUNTER — Encounter: Payer: Self-pay | Admitting: Cardiology

## 2019-10-06 ENCOUNTER — Ambulatory Visit: Payer: Medicare HMO | Admitting: Cardiology

## 2019-10-06 ENCOUNTER — Other Ambulatory Visit: Payer: Self-pay

## 2019-10-06 VITALS — BP 122/58 | HR 84 | Ht 64.0 in | Wt 187.0 lb

## 2019-10-06 DIAGNOSIS — E782 Mixed hyperlipidemia: Secondary | ICD-10-CM | POA: Diagnosis not present

## 2019-10-06 DIAGNOSIS — I10 Essential (primary) hypertension: Secondary | ICD-10-CM

## 2019-10-06 DIAGNOSIS — I5032 Chronic diastolic (congestive) heart failure: Secondary | ICD-10-CM

## 2019-10-06 MED ORDER — FUROSEMIDE 20 MG PO TABS: 20.0000 mg | ORAL_TABLET | Freq: Every day | ORAL | 1 refills | Status: DC

## 2019-10-06 NOTE — Progress Notes (Signed)
Cardiology Office Note  Date: 10/06/2019   ID: Jacqueline Bird, DOB April 14, 1951, MRN [id number]  PCP:  Celene Squibb, MD  Cardiologist:  Rozann Lesches, MD Electrophysiologist:  None   Chief Complaint  Patient presents with  . Cardiac follow-up    History of Present Illness: Jacqueline Bird is a 69 y.o. female last seen in February by Ms. Strader PA-C.  She presents for a follow-up visit.  She states that her weight has fluctuated by few pounds up or down, but she has not had to take any additional Lasix as yet.  She reports mild foot swelling at times, no orthopnea or PND.  She was continued on Lasix at 20 mg daily at the last visit.  She is otherwise on aspirin and Zocor.  At present she is not on a potassium supplement.  She has established with Dr. Nevada Crane for PCP and anticipates lab work for next visit.  She continues to follow with Dr. Delton Coombes for management of of anal carcinoma, stage IIIc.  Follow-up PET scan is pending in June.   Past Medical History:  Diagnosis Date  . 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 Cervical cancer ([place])   . Depression   . Essential hypertension   . History of cervical cancer   . Mixed hyperlipidemia   . Type 2 diabetes mellitus ([place])     Past Surgical History:  Procedure Laterality Date  . CATARACT EXTRACTION, BILATERAL Right 05/2017  . CERVICAL CONE BIOPSY    . HAND AMPUTATION Left   . PORTACATH PLACEMENT Left 02/08/2019   Procedure: INSERTION PORT-A-CATH;  Surgeon: Virl Cagey, MD;  Location: [facility];  Service: General;  Laterality: Left;  . RECTAL BIOPSY  01/26/2019   Procedure: BIOPSY OF ANAL MASS;  Surgeon: Virl Cagey, MD;  Location: [facility];  Service: General;;  . RECTAL EXAM UNDER ANESTHESIA N/A 01/26/2019   Procedure: RECTAL EXAM UNDER ANESTHESIA;  Surgeon: Virl Cagey, MD;  Location: [facility];  Service: General;  Laterality: N/A;     Current Outpatient Medications  Medication Sig Dispense Refill  . ALPRAZolam (XANAX) 0.5 MG tablet Take 1 tablet (0.5 mg total) by mouth at bedtime as needed for anxiety or sleep. 30 tablet 1  . furosemide (LASIX) 20 MG tablet Take 1 tablet (20 mg total) by mouth daily. Can take an extra tablet for weight gain or edema. 110 tablet 1  . gabapentin (NEURONTIN) 100 MG capsule Take 100-200 mg by mouth See admin instructions. Take 200mg  in the AM and 100mg  in the PM.    . silver sulfADIAZINE (SILVADENE) 1 % cream Apply to affected area daily 1000 g 3  . simvastatin (ZOCOR) 40 MG tablet Take 40 mg by mouth daily.     . TRESIBA FLEXTOUCH 200 UNIT/ML SOPN Inject 20 Units into the skin daily before breakfast.    . TRINTELLIX 10 MG TABS tablet Take 10 mg by mouth daily.    . TRULICITY A999333 0000000 SOPN Inject 0.75 mg into the muscle every Sunday.     Marland Kitchen aspirin EC 81 MG EC tablet Take 1 tablet (81 mg total) by mouth daily. 30 tablet 0   No current facility-administered medications for this visit.   Facility-Administered Medications Ordered in Other Visits  Medication Dose Route Frequency Provider Last Rate Last Admin  . heparin lock flush 100 unit/mL  500 Units  Intracatheter Once PRN Derek Jack, MD      . sodium chloride flush (NS) 0.9 % injection 10 mL  10 mL Intracatheter PRN Derek Jack, MD   10 mL at 03/03/19 1152   Allergies:  Sulfa antibiotics   ROS:   No palpitations or syncope.  Physical Exam: VS:  BP (!) 122/58   Pulse 84   Ht 5\' 4"  (1.626 m)   Wt 187 lb (84.8 kg)   SpO2 97%   BMI 32.10 kg/m , BMI Body mass index is 32.1 kg/m.  Wt Readings from Last 3 Encounters:  10/06/19 187 lb (84.8 kg)  07/19/19 183 lb 11.2 oz (83.3 kg)  07/10/19 182 lb 6.4 oz (82.7 kg)    General: Patient appears comfortable at rest. HEENT: Conjunctiva and lids normal, wearing a mask. Neck: Supple, no elevated JVP or carotid bruits, no thyromegaly. Lungs: Clear to auscultation,  nonlabored breathing at rest. Cardiac: Regular rate and rhythm, no S3 or significant systolic murmur, no pericardial rub. Extremities: Trace ankle edema, distal pulses 2+.  ECG:  An ECG dated 05/21/2019 was personally reviewed today and demonstrated:  Sinus rhythm with ventricular bigeminy.  Recent Labwork: 05/21/2019: B Natriuretic Peptide 683.0 05/22/2019: ALT 13; AST 20; TSH 1.461 05/23/2019: Hemoglobin 9.9; Magnesium 1.8; Platelets 280 05/27/2019: BUN 21; Creatinine, Ser 0.72; Potassium 4.7; Sodium 136   Other Studies Reviewed Today:  Echocardiogram 05/22/2019: 1. Left ventricular ejection fraction, by visual estimation, is 50 to  55%. The left ventricle has low normal function. There is no left  ventricular hypertrophy.  2. Basal inferolateral segment and basal inferior segment are abnormal.  3. Left ventricular diastolic parameters are consistent with Grade I  diastolic dysfunction (impaired relaxation).  4. The left ventricle demonstrates regional wall motion abnormalities.  5. Global right ventricle has normal systolic function.The right  ventricular size is normal. No increase in right ventricular wall  thickness.  6. Left atrial size was moderately dilated.  7. Right atrial size was normal.  8. The mitral valve is grossly normal. Mild mitral valve regurgitation.  9. The tricuspid valve is grossly normal.  10. The aortic valve is tricuspid. Aortic valve regurgitation is not  visualized.  11. The pulmonic valve was grossly normal. Pulmonic valve regurgitation is  trivial.  12. Mildly elevated pulmonary artery systolic pressure.  13. The tricuspid regurgitant velocity is 2.30 m/s, and with an assumed  right atrial pressure of 15 mmHg, the estimated right ventricular systolic  pressure is mildly elevated at 36.2 mmHg.  14. The inferior vena cava is dilated in size with <50% respiratory  variability, suggesting right atrial pressure of 15 mmHg.   Assessment and Plan:  1.   Chronic diastolic heart failure.  LVEF 50 to 55% with basal inferior/inferolateral hypokinesis and grade 1 diastolic dysfunction by echocardiogram in January.  Plan is to continue aspirin and statin for treatment of potential underlying ischemic heart disease although she does not report any active angina symptoms.  She tends to run a low normal blood pressure.  We will keep medications simple for now, Lasix refilled at 20 mg daily with doubling of dose for weight increase of 2 to 3 pounds in 24 hours or 5 pounds in a week.  No longer on potassium supplement, this can be reassessed with follow-up lab work per Dr. Nevada Crane.  2.  Mixed hyperlipidemia, she remains on simvastatin.  Medication Adjustments/Labs and Tests Ordered: Current medicines are reviewed at length with the patient today.  Concerns regarding medicines  are outlined above.   Tests Ordered: No orders of the defined types were placed in this encounter.   Medication Changes: Meds ordered this encounter  Medications  . furosemide (LASIX) 20 MG tablet    Sig: Take 1 tablet (20 mg total) by mouth daily. Can take an extra tablet for weight gain or edema.    Dispense:  110 tablet    Refill:  1    Disposition:  Follow up 6 months in the [place] office.  Signed, Satira Sark, MD, [facility] 10/06/2019 2:13 PM    [facility] at [facility] [address], [place], [place] [address] Phone: [phone]; Fax: [phone]

## 2019-10-06 NOTE — Patient Instructions (Signed)
Medication Instructions:  Your physician recommends that you continue on your current medications as directed. Please refer to the Current Medication list given to you today.  *If you need a refill on your cardiac medications before your next appointment, please call your pharmacy*   Lab Work: None today If you have labs (blood work) drawn today and your tests are completely normal, you will receive your results only by: . MyChart Message (if you have MyChart) OR . A paper copy in the mail If you have any lab test that is abnormal or we need to change your treatment, we will call you to review the results.   Testing/Procedures: None today   Follow-Up: At [facility], you and your health needs are our priority.  As part of our continuing mission to provide you with exceptional heart care, we have created designated Provider Care Teams.  These Care Teams include your primary Cardiologist (physician) and Advanced Practice Providers (APPs -  Physician Assistants and Nurse Practitioners) who all work together to provide you with the care you need, when you need it.  We recommend signing up for the patient portal called "MyChart".  Sign up information is provided on this After Visit Summary.  MyChart is used to connect with patients for Virtual Visits (Telemedicine).  Patients are able to view lab/test results, encounter notes, upcoming appointments, etc.  Non-urgent messages can be sent to your provider as well.   To learn more about what you can do with MyChart, go to [email or web address].    Your next appointment:   6 month(s)  The format for your next appointment:   In Person  Provider:   Samuel McDowell, MD or Brittany Strader, PA-C   Other Instructions None      Thank you for choosing [facility] !         

## 2019-10-16 ENCOUNTER — Other Ambulatory Visit: Payer: Self-pay

## 2019-10-16 ENCOUNTER — Encounter (HOSPITAL_COMMUNITY): Payer: Self-pay | Admitting: Emergency Medicine

## 2019-10-16 ENCOUNTER — Emergency Department (HOSPITAL_COMMUNITY): Payer: Medicare HMO

## 2019-10-16 ENCOUNTER — Emergency Department (HOSPITAL_COMMUNITY)
Admission: EM | Admit: 2019-10-16 | Discharge: 2019-10-16 | Disposition: A | Discharge status: Home / Self Care | Payer: Medicare HMO | Attending: Emergency Medicine | Admitting: Emergency Medicine

## 2019-10-16 DIAGNOSIS — R05 Cough: Secondary | ICD-10-CM | POA: Insufficient documentation

## 2019-10-16 DIAGNOSIS — R059 Cough, unspecified: Secondary | ICD-10-CM

## 2019-10-16 DIAGNOSIS — E119 Type 2 diabetes mellitus without complications: Secondary | ICD-10-CM | POA: Insufficient documentation

## 2019-10-16 DIAGNOSIS — Z7984 Long term (current) use of oral hypoglycemic drugs: Secondary | ICD-10-CM | POA: Diagnosis not present

## 2019-10-16 DIAGNOSIS — Z79899 Other long term (current) drug therapy: Secondary | ICD-10-CM | POA: Insufficient documentation

## 2019-10-16 DIAGNOSIS — Z7982 Long term (current) use of aspirin: Secondary | ICD-10-CM | POA: Insufficient documentation

## 2019-10-16 DIAGNOSIS — I509 Heart failure, unspecified: Secondary | ICD-10-CM | POA: Insufficient documentation

## 2019-10-16 DIAGNOSIS — I110 Hypertensive heart disease with heart failure: Secondary | ICD-10-CM | POA: Insufficient documentation

## 2019-10-16 MED ORDER — ALBUTEROL SULFATE HFA 108 (90 BASE) MCG/ACT IN AERS
4.0000 | INHALATION_SPRAY | Freq: Once | RESPIRATORY_TRACT | Status: AC
  Administered 2019-10-16: 4 via RESPIRATORY_TRACT
  Filled 2019-10-16: qty 6.7

## 2019-10-16 MED ORDER — GUAIFENESIN-DM 100-10 MG/5ML PO SYRP: 5.0000 mL | ORAL_SOLUTION | ORAL | Status: DC | PRN

## 2019-10-16 MED ORDER — PREDNISONE 20 MG PO TABS
60.0000 mg | ORAL_TABLET | Freq: Once | ORAL | Status: AC
  Administered 2019-10-16: 60 mg via ORAL
  Filled 2019-10-16: qty 3

## 2019-10-16 MED ORDER — PREDNISONE 20 MG PO TABS: 20.0000 mg | ORAL_TABLET | Freq: Two times a day (BID) | ORAL | 0 refills | Status: DC

## 2019-10-16 NOTE — ED Provider Notes (Signed)
[facility] EM[facility]GENCY DEPARTMENT Provider Note   CSN: [id number] Arrival date & time: 10/16/19  1119     History Chief Complaint  Patient presents with  . Cough    Jacqueline Bird is a 69 y.o. female.  HPI She complains of a nonproductive cough which started last night.  She attributes it to being around "drywall dust," after being exposed at her home, where she is having her bathroom redone.  She feels that she had a fever last night but "it broke."  She denies nausea, vomiting, sputum production, weakness or dizziness.  She is not a smoker currently.  No ongoing problems with pulmonary conditions.  No known sick contacts.  Covid vaccines up-to-date.  There are no other known modifying factors.    Past Medical History:  Diagnosis Date  . 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 Cervical cancer ([place])   . Depression   . Essential hypertension   . History of cervical cancer   . Mixed hyperlipidemia   . Type 2 diabetes mellitus [facility])     Patient Active Problem List   Diagnosis Date Noted  . Pleural effusion, right   . Acute diastolic CHF (congestive heart failure) ([place]) 05/23/2019  . Acute respiratory failure with hypoxia ([place]) 05/23/2019  . Hypokalemia 05/23/2019  . Essential hypertension 05/23/2019  . Type 2 diabetes mellitus with other specified complication ([place]) [id number]  . Anxiety 05/23/2019  . CHF (congestive heart failure) ([place]) 05/21/2019  . Anal cancer ([place]) 02/01/2019  . Mass of anus 01/24/2019  . Presbycusis of both ears 03/18/2017  . Sudden hearing loss, left 03/18/2017    Past Surgical History:  Procedure Laterality Date  . CATARACT EXTRACTION, BILATERAL Right 05/2017  . CERVICAL CONE BIOPSY    . HAND AMPUTATION Left   . PORTACATH PLACEMENT Left 02/08/2019   Procedure: INSERTION PORT-A-CATH;  Surgeon: Virl Cagey, MD;  Location: [facility];  Service:  General;  Laterality: Left;  . RECTAL BIOPSY  01/26/2019   Procedure: BIOPSY OF ANAL MASS;  Surgeon: Virl Cagey, MD;  Location: [facility];  Service: General;;  . RECTAL EXAM UNDER ANESTHESIA N/A 01/26/2019   Procedure: RECTAL EXAM UNDER ANESTHESIA;  Surgeon: Virl Cagey, MD;  Location: [facility];  Service: General;  Laterality: N/A;     OB History   No obstetric history on file.     Family History  Problem Relation Age of Onset  . Cataracts Mother   . Glaucoma Mother   . Heart disease Mother   . Diabetes Father   . Heart attack Father   . Cataracts Sister   . Lung disease Sister   . Cataracts Brother   . Diabetes Brother   . Heart disease Brother   . Macular degeneration Maternal Aunt   . Diabetes Maternal Grandfather   . Heart disease Sister   . Diabetes Sister   . Throat cancer Son   . Breast cancer Niece   . Breast cancer Niece   . Amblyopia Neg Hx   . Blindness Neg Hx   . Retinal detachment Neg Hx   . Strabismus Neg Hx   . Retinitis pigmentosa Neg Hx     Social History   Tobacco Use  . Smoking status: Never Smoker  . Smokeless tobacco: Never Used  Substance Use Topics  . Alcohol use: Not Currently  . Drug use:  Never    Home Medications Prior to Admission medications   Medication Sig Start Date End Date Taking? Authorizing Provider  ALPRAZolam Duanne Moron) 0.5 MG tablet Take 1 tablet (0.5 mg total) by mouth at bedtime as needed for anxiety or sleep. 09/06/19  Yes Lockamy, Randi L, NP-C  aspirin EC 81 MG EC tablet Take 1 tablet (81 mg total) by mouth daily. 05/28/19  Yes Kathie Dike, MD  furosemide (LASIX) 20 MG tablet Take 1 tablet (20 mg total) by mouth daily. Can take an extra tablet for weight gain or edema. 10/06/19  Yes Satira Sark, MD  gabapentin (NEURONTIN) 100 MG capsule Take 100-200 mg by mouth See admin instructions. Take 200mg  in the AM and 100mg  in the PM. 01/09/19  Yes [provider]  silver sulfADIAZINE (SILVADENE) 1 % cream  Apply to affected area daily 05/03/19 05/02/20 Yes Derek Jack, MD  simvastatin (ZOCOR) 40 MG tablet Take 40 mg by mouth daily.  12/12/18  Yes [provider]  traZODone (DESYREL) 50 MG tablet Take 50 mg by mouth at bedtime. 08/28/19  Yes [provider]  TRESIBA FLEXTOUCH 200 UNIT/ML SOPN Inject 20 Units into the skin daily before breakfast. 05/27/19  Yes Memon, Jolaine Artist, MD  TRINTELLIX 10 MG TABS tablet Take 10 mg by mouth daily. 01/09/19  Yes [provider]  TRULICITY A999333 0000000 SOPN Inject 0.75 mg into the muscle every Sunday.  01/10/19  Yes [provider]  predniSONE (DELTASONE) 20 MG tablet Take 1 tablet (20 mg total) by mouth 2 (two) times daily. 10/16/19   Daleen Bo, MD    Allergies    Sulfa antibiotics  Review of Systems   Review of Systems  All other systems reviewed and are negative.   Physical Exam Updated Vital Signs BP (!) 152/59 (BP Location: Right Arm)   Pulse 68   Temp 98.7 F (37.1 C) (Oral)   Resp 18   Ht 5\' 4"  (1.626 m)   Wt 80.7 kg   SpO2 97%   BMI 30.55 kg/m   Physical Exam Vitals and nursing note reviewed.  Constitutional:      Appearance: She is well-developed.  HENT:     Head: Normocephalic and atraumatic.     Right Ear: External ear normal.     Left Ear: External ear normal.  Eyes:     Conjunctiva/sclera: Conjunctivae normal.     Pupils: Pupils are equal, round, and reactive to light.  Neck:     Trachea: Phonation normal.  Cardiovascular:     Rate and Rhythm: Normal rate and regular rhythm.     Heart sounds: Normal heart sounds.  Pulmonary:     Effort: Pulmonary effort is normal. No respiratory distress.     Breath sounds: Normal breath sounds. No stridor. No wheezing or rhonchi.     Comments: Occasional nonproductive cough Abdominal:     Palpations: Abdomen is soft.     Tenderness: There is no abdominal tenderness.  Musculoskeletal:        General: Normal range of motion.     Cervical  back: Normal range of motion and neck supple.  Skin:    General: Skin is warm and dry.  Neurological:     Mental Status: She is alert and oriented to person, place, and time.     Cranial Nerves: No cranial nerve deficit.     Sensory: No sensory deficit.     Motor: No abnormal muscle tone.     Coordination: Coordination normal.  Psychiatric:        Mood and Affect: Mood normal.        Behavior: Behavior normal.        Thought Content: Thought content normal.        Judgment: Judgment normal.     ED Results / Procedures / Treatments   Labs (all labs ordered are listed, but only abnormal results are displayed) Labs Reviewed - No data to display  EKG None  Radiology DG Chest 2 View  Result Date: 10/16/2019 CLINICAL DATA:  Cough and shortness of breath EXAM: CHEST - 2 VIEW COMPARISON:  May 25, 2019 chest radiograph; PET-CT July 17, 2019 FINDINGS: The lungs are clear. The heart size and pulmonary vascularity are normal. No adenopathy. Port-A-Cath tip is in the superior vena cava slightly beyond the junction with the left innominate vein. No pneumothorax. No bone lesions. There is calcification in the left carotid artery. IMPRESSION: Lungs clear.  Cardiac silhouette normal.  Port-A-Cath as described. Foci of carotid artery calcification noted. Electronically Signed   By: Lowella Grip III M.D.   On: 10/16/2019 12:00    Procedures Procedures (including critical care time)  Medications Ordered in ED Medications  guaiFENesin-dextromethorphan (ROBITUSSIN DM) 100-10 MG/5ML syrup 5 mL (has no administration in time range)  predniSONE (DELTASONE) tablet 60 mg (60 mg Oral Given 10/16/19 1511)  albuterol (VENTOLIN HFA) 108 (90 Base) MCG/ACT inhaler 4 puff (4 puffs Inhalation Given 10/16/19 1512)    ED Course  I have reviewed the triage vital signs and the nursing notes.  Pertinent labs & imaging results that were available during my care of the patient were reviewed by me and  considered in my medical decision making (see chart for details).    MDM Rules/Calculators/A&P                       Patient Vitals for the past 24 hrs:  BP Temp Temp src Pulse Resp SpO2 Height Weight  10/16/19 1547 (!) 152/59 98.7 F (37.1 C) Oral 68 -- 97 % -- --  10/16/19 1500 (!) 144/42 -- -- 81 -- 92 % -- --  10/16/19 1416 125/63 -- -- (!) 29 18 97 % -- --  10/16/19 1133 -- -- -- -- -- -- 5\' 4"  (1.626 m) 80.7 kg  10/16/19 1123 (!) 142/54 99.8 F (37.7 C) Oral 93 18 95 % 5' 4.5" (1.638 m) 80.7 kg      Medical Decision Making:  This patient is presenting for evaluation of cough, which does require a range of treatment options, and is a complaint that involves a moderate risk of morbidity and mortality. The differential diagnoses include asthma, bronchitis, pneumonia. I decided to review old records, and in summary adult female with history of renal cancer, not currently treated, and history of ex-smoker status, but no ongoing bronchitis symptoms.  I did not require additional historical information from anyone.   Radiologic Tests Ordered, included chest x-ray.  I independently Visualized: Radiographic images, which show no CHF or edema  Cardiac Monitor Tracing which shows normal sinus rhythm     Critical Interventions-clinical evaluation, chest x-ray, observation, treatment and reassessment  After These Interventions, the Patient was reevaluated and was found to be pacing in the hall, requesting to leave, before I could reevaluate her.  She told the nurse "I have got to get out of here."  Cough is nonspecific.  Doubt pneumonia, bronchitis or asthma.  No hemodynamic or pulmonary instability.  CRITICAL  CARE-no Performed by: Daleen Bo  Nursing Notes Reviewed/ Care Coordinated Applicable Imaging Reviewed Interpretation of Laboratory Data incorporated into ED treatment  The patient appears reasonably screened and/or stabilized for discharge and I doubt any other medical  condition or other [facility] requiring further screening, evaluation, or treatment in the ED at this time prior to discharge.  Plan: Home Medications-albuterol inhaler every 3-4 hours as needed, continue usual; Home Treatments-rest, fluids; return here if the recommended treatment, does not improve the symptoms; Recommended follow up-PCP, as needed     Final Clinical Impression(s) / ED Diagnoses Final diagnoses:  Cough    Rx / DC Orders ED Discharge Orders         Ordered    predniSONE (DELTASONE) 20 MG tablet  2 times daily     10/16/19 1546           Daleen Bo, MD 10/16/19 1639

## 2019-10-16 NOTE — ED Triage Notes (Signed)
Patient complains of rhinorrhea, headache, and nonproductive cough since Friday. Patient states she believes the symptoms are related to drywall dust that is being produced from currently having her bathroom redone. Patient is alert, oriented, and in no apparent distress at this time.

## 2019-10-16 NOTE — Discharge Instructions (Addendum)
Use the albuterol inhaler 2 puffs every 3-4 hours for cough or trouble breathing.  Start the prednisone prescription tomorrow.  You can also use Robitussin-DM as needed to help with the cough.  We will also help to drink 3 to 4 glasses of water each day to ensure that you are well hydrated, this can help decrease coughing.

## 2019-10-23 ENCOUNTER — Other Ambulatory Visit: Payer: Self-pay

## 2019-10-23 ENCOUNTER — Encounter (HOSPITAL_COMMUNITY)
Admission: RE | Admit: 2019-10-23 | Discharge: 2019-10-23 | Disposition: A | Discharge status: Home / Self Care | Payer: Medicare HMO | Source: Ambulatory Visit | Attending: Hematology | Admitting: Hematology

## 2019-10-23 ENCOUNTER — Other Ambulatory Visit (HOSPITAL_COMMUNITY): Payer: Medicare HMO

## 2019-10-23 ENCOUNTER — Inpatient Hospital Stay (HOSPITAL_COMMUNITY): Payer: Medicare HMO | Attending: Hematology

## 2019-10-23 DIAGNOSIS — Z803 Family history of malignant neoplasm of breast: Secondary | ICD-10-CM | POA: Diagnosis not present

## 2019-10-23 DIAGNOSIS — R05 Cough: Secondary | ICD-10-CM | POA: Insufficient documentation

## 2019-10-23 DIAGNOSIS — R0602 Shortness of breath: Secondary | ICD-10-CM | POA: Diagnosis not present

## 2019-10-23 DIAGNOSIS — C210 Malignant neoplasm of anus, unspecified: Secondary | ICD-10-CM | POA: Diagnosis present

## 2019-10-23 DIAGNOSIS — E782 Mixed hyperlipidemia: Secondary | ICD-10-CM | POA: Insufficient documentation

## 2019-10-23 DIAGNOSIS — F329 Major depressive disorder, single episode, unspecified: Secondary | ICD-10-CM | POA: Diagnosis not present

## 2019-10-23 DIAGNOSIS — C20 Malignant neoplasm of rectum: Secondary | ICD-10-CM | POA: Diagnosis not present

## 2019-10-23 DIAGNOSIS — Z79899 Other long term (current) drug therapy: Secondary | ICD-10-CM | POA: Insufficient documentation

## 2019-10-23 DIAGNOSIS — E119 Type 2 diabetes mellitus without complications: Secondary | ICD-10-CM | POA: Diagnosis not present

## 2019-10-23 DIAGNOSIS — Z7982 Long term (current) use of aspirin: Secondary | ICD-10-CM | POA: Diagnosis not present

## 2019-10-23 LAB — COMPREHENSIVE METABOLIC PANEL
ALT: 16 U/L (ref 0–44)
AST: 17 U/L (ref 15–41)
Albumin: 3.4 g/dL — ABNORMAL LOW (ref 3.5–5.0)
Alkaline Phosphatase: 68 U/L (ref 38–126)
Anion gap: 10 (ref 5–15)
BUN: 26 mg/dL — ABNORMAL HIGH (ref 8–23)
CO2: 28 mmol/L (ref 22–32)
Calcium: 9.2 mg/dL (ref 8.9–10.3)
Chloride: 99 mmol/L (ref 98–111)
Creatinine, Ser: 0.74 mg/dL (ref 0.44–1.00)
GFR calc Af Amer: >60 mL/min (ref >60)
GFR calc non Af Amer: >60 mL/min (ref >60)
Glucose, Bld: 151 mg/dL — ABNORMAL HIGH (ref 70–99)
Potassium: 3.6 mmol/L (ref 3.5–5.1)
Sodium: 137 mmol/L (ref 135–145)
Total Bilirubin: 0.3 mg/dL (ref 0.3–1.2)
Total Protein: 7 g/dL (ref 6.5–8.1)

## 2019-10-23 LAB — CBC WITH DIFFERENTIAL/PLATELET
Abs Immature Granulocytes: 0.05 10*3/uL (ref 0.00–0.07)
Basophils Absolute: 0.1 10*3/uL (ref 0.0–0.1)
Basophils Relative: 1 %
Eosinophils Absolute: 0.1 10*3/uL (ref 0.0–0.5)
Eosinophils Relative: 1 %
HCT: 41.7 % (ref 36.0–46.0)
Hemoglobin: 13.3 g/dL (ref 12.0–15.0)
Immature Granulocytes: 1 %
Lymphocytes Relative: 13 %
Lymphs Abs: 1.3 10*3/uL (ref 0.7–4.0)
MCH: 30.3 pg (ref 26.0–34.0)
MCHC: 31.9 g/dL (ref 30.0–36.0)
MCV: 95 fL (ref 80.0–100.0)
Monocytes Absolute: 0.9 10*3/uL (ref 0.1–1.0)
Monocytes Relative: 9 %
Neutro Abs: 7.6 10*3/uL (ref 1.7–7.7)
Neutrophils Relative %: 75 %
Platelets: 334 10*3/uL (ref 150–400)
RBC: 4.39 MIL/uL (ref 3.87–5.11)
RDW: 13.2 % (ref 11.5–15.5)
WBC: 9.9 10*3/uL (ref 4.0–10.5)
nRBC: 0 % (ref 0.0–0.2)

## 2019-10-23 MED ORDER — FLUDEOXYGLUCOSE F - 18 (FDG) INJECTION
10.9700 | Freq: Once | INTRAVENOUS | Status: AC | PRN
  Administered 2019-10-23: 10.97 via INTRAVENOUS

## 2019-10-25 ENCOUNTER — Inpatient Hospital Stay (HOSPITAL_COMMUNITY): Payer: Medicare HMO | Admitting: Hematology

## 2019-10-25 VITALS — BP 151/56 | HR 54 | Temp 98.5°F | Resp 18 | Wt 175.0 lb

## 2019-10-25 DIAGNOSIS — C210 Malignant neoplasm of anus, unspecified: Secondary | ICD-10-CM

## 2019-10-25 DIAGNOSIS — C20 Malignant neoplasm of rectum: Secondary | ICD-10-CM | POA: Diagnosis not present

## 2019-10-25 MED ORDER — ALPRAZOLAM 0.5 MG PO TABS: 0.5000 mg | ORAL_TABLET | Freq: Every evening | ORAL | 4 refills | Status: DC | PRN

## 2019-10-25 NOTE — Patient Instructions (Addendum)
[place] at [facility] Discharge Instructions  You were seen today by Dr. Delton Coombes. He went over your recent results and scans. You will be referred to get an anal biopsy. Dr. Delton Coombes will see you back in 4 months for labs and follow up.   Thank you for choosing [place] at [facility] to provide your oncology and hematology care.  To afford each patient quality time with our provider, please arrive at least 15 minutes before your scheduled appointment time.   If you have a lab appointment with the [facility] please come in thru the Main Entrance and check in at the main information desk  You need to re-schedule your appointment should you arrive 10 or more minutes late.  We strive to give you quality time with our providers, and arriving late affects you and other patients whose appointments are after yours.  Also, if you no show three or more times for appointments you may be dismissed from the clinic at the providers discretion.     Again, thank you for choosing [facility].  Our hope is that these requests will decrease the amount of time that you wait before being seen by our physicians.       _____________________________________________________________  Should you have questions after your visit to [facility], please contact our office at [phone] between the hours of 8:00 a.m. and 4:30 p.m.  Voicemails left after 4:00 p.m. will not be returned until the following business day.  For prescription refill requests, have your pharmacy contact our office and allow 72 hours.    Cancer Center Support Programs:   > Cancer Support Group  2nd Tuesday of the month 1pm-2pm, Journey Room

## 2019-10-25 NOTE — Progress Notes (Signed)
Jacqueline Bird [address], [place] [address]   CLINIC:  Medical Oncology/Hematology  PCP:  Celene Squibb, MD [address] [place] [address] [phone]   REASON FOR VISIT:  Follow-up for anal cancer  PRIOR THERAPY:  1. Fluorouracil & mitomycin from 03/03/2019 through 03/31/2019  CURRENT THERAPY: Observation  BRIEF ONCOLOGIC HISTORY:  Oncology History  Anal cancer ([facility])  02/01/2019 Initial Diagnosis   Anal cancer ([facility])   02/16/2019 Cancer Staging   Staging form: Anus, AJCC 8th Edition - Clinical: Stage IIIC (cT3, cN1c, cM0) - Signed by Kyung Rudd, MD on 02/16/2019   03/03/2019 -  Chemotherapy   The patient had mitoMYcin (MUTAMYCIN) chemo injection 20 mg, 9.7 mg/m2 = 21 mg, Intravenous,  Once, 1 of 1 cycle Dose modification: 8 mg/m2 (original dose 10 mg/m2, Cycle 1, Reason: Provider Judgment) Administration: 20 mg (03/03/2019), 16.5 mg (03/31/2019) fluorouracil (ADRUCIL) 8,300 mg in sodium chloride 0.9 % 84 mL chemo infusion, 1,000 mg/m2/day = 8,300 mg, Intravenous, 4D (96 hours ), 1 of 1 cycle Administration: 8,300 mg (03/03/2019), 8,300 mg (03/31/2019)  for chemotherapy treatment.      CANCER STAGING: Cancer Staging Anal cancer [facility]) Staging form: Anus, AJCC 8th Edition - Clinical: Stage IIIC (cT3, cN1c, cM0) - Signed by Kyung Rudd, MD on 02/16/2019   INTERVAL HISTORY:  Jacqueline Bird, a 69 y.o. female, returns for routine follow-up of her anal cancer. Jacqueline Bird was last seen on Aug 17, 2019. Her sister passed away recently. Patient is feeling slightly depressed.  Today she is in with her daughter. She reports bleeding and pain in the rectal area; she reports that the rectal rawness is tolerable. She had to start wearing a diaper for the leakage and reports that she will leak for 2 days after a BM.    REVIEW OF SYSTEMS:  Review of Systems  Constitutional: Positive for appetite change (moderately decreased) and fatigue  (moderate).  Respiratory: Positive for cough and shortness of breath.   Cardiovascular: Positive for leg swelling (L leg).  Gastrointestinal: Positive for rectal pain (7/10).  Neurological: Positive for headaches and numbness (hands & feet).  All other systems reviewed and are negative.   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   Cervical cancer ([facility])    Depression    Essential hypertension    History of cervical cancer    Mixed hyperlipidemia    Type 2 diabetes mellitus ([place])    Past Surgical History:  Procedure Laterality Date   CATARACT EXTRACTION, BILATERAL Right 05/2017   CERVICAL CONE BIOPSY     HAND AMPUTATION Left    PORTACATH PLACEMENT Left 02/08/2019   Procedure: INSERTION PORT-A-CATH;  Surgeon: Virl Cagey, MD;  Location: [facility];  Service: General;  Laterality: Left;   RECTAL BIOPSY  01/26/2019   Procedure: BIOPSY OF ANAL MASS;  Surgeon: Virl Cagey, MD;  Location: [facility];  Service: General;;   RECTAL EXAM UNDER ANESTHESIA N/A 01/26/2019   Procedure: RECTAL EXAM UNDER ANESTHESIA;  Surgeon: Virl Cagey, MD;  Location: [facility];  Service: General;  Laterality: N/A;    SOCIAL HISTORY:  Social History   Socioeconomic History   Marital status: Single    Spouse name: Not on file   Number of children: 2   Years of education: Not  on file   Highest education level: Not on file  Occupational History   Occupation: retired  Tobacco Use   Smoking status: Never Smoker   Smokeless tobacco: Never Used  Substance and Sexual Activity   Alcohol use: Not Currently   Drug use: Never   Sexual activity: Not on file  Other Topics Concern   Not on file  Social History Narrative   Pt is retired and currently engaged.   Social Determinants of Health   Financial Resource Strain: Medium Risk    Difficulty of Paying Living Expenses: Somewhat hard  Food Insecurity: No Food Insecurity   Worried About Charity fundraiser in the Last Year: Never true   Ran Out of Food in the Last Year: Never true  Transportation Needs: No Transportation Needs   Lack of Transportation (Medical): No   Lack of Transportation (Non-Medical): No  Physical Activity: Sufficiently Active   Days of Exercise per Week: 7 days   Minutes of Exercise per Session: 60 min  Stress: Stress Concern Present   Feeling of Stress : To some extent  Social Connections: Slightly Isolated   Frequency of Communication with Friends and Family: Three times a week   Frequency of Social Gatherings with Friends and Family: Three times a week   Attends Religious Services: 1 to 4 times per year   Active Member of Clubs or Organizations: Yes   Attends Archivist Meetings: 1 to 4 times per year   Marital Status: Separated  Intimate Partner Violence: Not At Risk   Fear of Current or Ex-Partner: No   Emotionally Abused: No   Physically Abused: No   Sexually Abused: No    FAMILY HISTORY:  Family History  Problem Relation Age of Onset   Cataracts Mother    Glaucoma Mother    Heart disease Mother    Diabetes Father    Heart attack Father    Cataracts Sister    Lung disease Sister    Cataracts Brother    Diabetes Brother    Heart disease Brother    Macular degeneration Maternal Aunt    Diabetes Maternal Grandfather    Heart disease Sister    Diabetes Sister    Throat cancer Son    Breast cancer Niece    Breast cancer Niece    Amblyopia Neg Hx    Blindness Neg Hx    Retinal detachment Neg Hx    Strabismus Neg Hx    Retinitis pigmentosa Neg Hx     CURRENT MEDICATIONS:  Current Outpatient Medications  Medication Sig Dispense Refill   ALPRAZolam (XANAX) 0.5 MG tablet Take 1 tablet (0.5 mg total) by mouth at bedtime as needed for anxiety or sleep. 30 tablet 1   aspirin  EC 81 MG EC tablet Take 1 tablet (81 mg total) by mouth daily. 30 tablet 0   furosemide (LASIX) 20 MG tablet Take 1 tablet (20 mg total) by mouth daily. Can take an extra tablet for weight gain or edema. 110 tablet 1   gabapentin (NEURONTIN) 100 MG capsule Take 100-200 mg by mouth See admin instructions. Take 200mg  in the AM and 100mg  in the PM.     predniSONE (DELTASONE) 20 MG tablet Take 1 tablet (20 mg total) by mouth 2 (two) times daily. 10 tablet 0   silver sulfADIAZINE (SILVADENE) 1 % cream Apply to affected area daily 1000 g 3   simvastatin (ZOCOR) 40 MG tablet Take 40 mg by mouth daily.  traZODone (DESYREL) 50 MG tablet Take 50 mg by mouth at bedtime.     TRESIBA FLEXTOUCH 200 UNIT/ML SOPN Inject 20 Units into the skin daily before breakfast.     TRINTELLIX 10 MG TABS tablet Take 10 mg by mouth daily.     TRULICITY 3.89 HT/3.4KA SOPN Inject 0.75 mg into the muscle every Sunday.      No current facility-administered medications for this visit.   Facility-Administered Medications Ordered in Other Visits  Medication Dose Route Frequency Provider Last Rate Last Admin   heparin lock flush 100 unit/mL  500 Units Intracatheter Once PRN Derek Jack, MD       sodium chloride flush (NS) 0.9 % injection 10 mL  10 mL Intracatheter PRN Derek Jack, MD   10 mL at 03/03/19 1152    ALLERGIES:  Allergies  Allergen Reactions   Sulfa Antibiotics Anaphylaxis and Swelling    Per pt, can use creams with sulfa in it    PHYSICAL EXAM:  Performance status (ECOG): 1 - Symptomatic but completely ambulatory  There were no vitals filed for this visit. Wt Readings from Last 3 Encounters:  10/16/19 178 lb (80.7 kg)  10/06/19 187 lb (84.8 kg)  07/19/19 183 lb 11.2 oz (83.3 kg)   Physical Exam Vitals reviewed.  Constitutional:      Appearance: Normal appearance.  Genitourinary:    Comments: Anterior anal wall hard and tender to palpation. Musculoskeletal:      Comments: Absent L hand  Neurological:     General: No focal deficit present.     Mental Status: She is alert and oriented to person, place, and time.  Psychiatric:        Mood and Affect: Mood normal.        Behavior: Behavior normal.      LABORATORY DATA:  I have reviewed the labs as listed.  CBC Latest Ref Rng & Units 10/23/2019 05/23/2019 05/22/2019  WBC 4.0 - 10.5 K/uL 9.9 4.4 6.5  Hemoglobin 12.0 - 15.0 g/dL 13.3 9.9(L) 11.0(L)  Hematocrit 36.0 - 46.0 % 41.7 32.8(L) 36.6  Platelets 150 - 400 K/uL 334 280 373   CMP Latest Ref Rng & Units 10/23/2019 05/27/2019 05/26/2019  Glucose 70 - 99 mg/dL 151(H) 138(H) 112(H)  BUN 8 - 23 mg/dL 26(H) 21 12  Creatinine 0.44 - 1.00 mg/dL 0.74 0.72 0.54  Sodium 135 - 145 mmol/L 137 136 135  Potassium 3.5 - 5.1 mmol/L 3.6 4.7 3.7  Chloride 98 - 111 mmol/L 99 98 99  CO2 22 - 32 mmol/L 28 31 27   Calcium 8.9 - 10.3 mg/dL 9.2 8.6(L) 8.5(L)  Total Protein 6.5 - 8.1 g/dL 7.0 - -  Total Bilirubin 0.3 - 1.2 mg/dL 0.3 - -  Alkaline Phos 38 - 126 U/L 68 - -  AST 15 - 41 U/L 17 - -  ALT 0 - 44 U/L 16 - -    DIAGNOSTIC IMAGING:  I have independently reviewed the scans and discussed with the patient. DG Chest 2 View  Result Date: 10/16/2019 CLINICAL DATA:  Cough and shortness of breath EXAM: CHEST - 2 VIEW COMPARISON:  May 25, 2019 chest radiograph; PET-CT July 17, 2019 FINDINGS: The lungs are clear. The heart size and pulmonary vascularity are normal. No adenopathy. Port-A-Cath tip is in the superior vena cava slightly beyond the junction with the left innominate vein. No pneumothorax. No bone lesions. There is calcification in the left carotid artery. IMPRESSION: Lungs clear.  Cardiac silhouette normal.  Port-A-Cath as described. Foci of carotid artery calcification noted. Electronically Signed   By: Lowella Grip III M.D.   On: 10/16/2019 12:00   NM PET Image Restag (PS) Skull Base To Thigh  Result Date: 10/24/2019 CLINICAL DATA:  Subsequent treatment  strategy for anal cancer. EXAM: NUCLEAR MEDICINE PET SKULL BASE TO THIGH TECHNIQUE: 11.0 mCi F-18 FDG was injected intravenously. Full-ring PET imaging was performed from the skull base to thigh after the radiotracer. CT data was obtained and used for attenuation correction and anatomic localization. Fasting blood glucose: 149 mg/dl COMPARISON:  PET-CT 07/17/2019 FINDINGS: Mediastinal blood pool activity: SUV max 1.8 Liver activity: SUV max 2.4 NECK: No significant abnormal hypermetabolic activity in this region. Incidental CT findings: Left mastoid effusion. Acute on chronic left maxillary sinusitis. Chronic ethmoid sinusitis. Bilateral common carotid atherosclerotic calcification. CHEST: No significant abnormal hypermetabolic activity in this region. Incidental CT findings: Coronary, aortic arch, and branch vessel atherosclerotic vascular disease. Mild cardiomegaly. Left Port-A-Cath tip: SVC. ABDOMEN/PELVIS: No specific worrisome recurrent hypermetabolic anal activity. Maximum SUV in the anal canal region 4.1, previously 4.3. No recurrent pathologically enlarged or hypermetabolic inguinal or external iliac adenopathy. No new worrisome adenopathy. Incidental CT findings: Aortoiliac atherosclerotic vascular disease. Small calcifications along the left renal hilum for example on image 170/3 could be from calculi or vascular calcifications. Small retroperitoneal lymph nodes are not pathologically enlarged nor are they hypermetabolic at this time. Hazy density along the lower abdominal wall pannus adipose tissues may reflect low-grade inflammation and is not appreciably changed. SKELETON: No significant abnormal hypermetabolic activity in this region. 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 IMPRESSION: 1. There is only low-grade residual activity in the anal region which could be physiologic. No current pathologically enlarged or hypermetabolic adenopathy, or evidence of  metastatic spread. 2. Other imaging findings of potential clinical significance: Aortic Atherosclerosis (ICD10-I70.0). Acute on chronic paranasal sinusitis with left mastoid effusion. Coronary atherosclerosis. Mild cardiomegaly. Electronically Signed   By: Van Clines M.D.   On: 10/24/2019 08:53     ASSESSMENT: 1.  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 -PET scan on 10/23/2019 showed no specific worrisome recurrent hypermetabolic anal activity. Maximum SUV in the anal region was 4.1, previously 4.3. No recurrent pathologically enlarged or hypermetabolic inguinal or external iliac lymph nodes. No metastatic spread.   PLAN:  1.  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 She reported occasional pain on bowel movement. She reports no control over bowel movement. -I discussed the PET scan findings with her and her daughter. -I will refer her to Dr. Constance Haw and get her opinion to see if indurated area in the anterior anal wall needs to be biopsied. -Otherwise I will see her back in 4 months for follow-up  2.  Perianal pain: -This is been intermittent although not requiring any pain medication.  3.  Difficulty falling asleep: -Continue Xanax 0.5 mg as needed. I have sent a refill.  4.  Diabetes: -Continue Tresiba and Trulicity.    Orders placed this encounter:  No orders of the defined types were placed in this encounter.  Total time spent is 30 minutes with more than 50% of the time spent face-to-face reviewing scan results, reassurance, counseling and coordination of care.  Derek Jack, MD [place] [phone]   I, Milinda Antis, am  acting as a scribe for Dr. Sanda Linger.  I, Derek Jack MD, have reviewed the above documentation for accuracy and  completeness, and I agree with the above.

## 2019-10-31 ENCOUNTER — Other Ambulatory Visit: Payer: Self-pay

## 2019-10-31 ENCOUNTER — Ambulatory Visit: Payer: Medicare HMO | Admitting: General Surgery

## 2019-10-31 ENCOUNTER — Encounter: Payer: Self-pay | Admitting: General Surgery

## 2019-10-31 VITALS — BP 122/73 | HR 77 | Temp 97.8°F | Resp 16 | Ht 64.5 in | Wt 175.0 lb

## 2019-10-31 DIAGNOSIS — C210 Malignant neoplasm of anus, unspecified: Secondary | ICD-10-CM | POA: Diagnosis not present

## 2019-10-31 NOTE — Progress Notes (Signed)
[facility] History and Physical    Chief Complaint    Follow-up      Jacqueline Bird is a 69 y.o. female.  HPI: Jacqueline Bird is a very sweet 69 yo who is known to me for anal cancer Stage IIIc. She has undergone treatment and has surpassed 6 months after her radiation which should get her to her final results. She has had a PET scan that demonstrates some minor uptake in the anal region that could be physiologic versus continued disease. She has lost her sister recently which was unexpected and delayed her coming to see me. She continues to have severe anal irritation and burning pain. She is raw and has leakage of stool. She is here today for an exam and determine if we need to do additional biopsies to see if she has residual disease and would need further surgical intervention.   Past Medical History:  Diagnosis Date  . 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 Cervical cancer ([place])   . Depression   . Essential hypertension   . History of cervical cancer   . Mixed hyperlipidemia   . Type 2 diabetes mellitus ([place])     Past Surgical History:  Procedure Laterality Date  . CATARACT EXTRACTION, BILATERAL Right 05/2017  . CERVICAL CONE BIOPSY    . HAND AMPUTATION Left   . PORTACATH PLACEMENT Left 02/08/2019   Procedure: INSERTION PORT-A-CATH;  Surgeon: Virl Cagey, MD;  Location: [facility];  Service: General;  Laterality: Left;  . RECTAL BIOPSY  01/26/2019   Procedure: BIOPSY OF ANAL MASS;  Surgeon: Virl Cagey, MD;  Location: [facility];  Service: General;;  . RECTAL EXAM UNDER ANESTHESIA N/A 01/26/2019   Procedure: RECTAL EXAM UNDER ANESTHESIA;  Surgeon: Virl Cagey, MD;  Location: [facility];  Service: General;  Laterality: N/A;    Family History  Problem Relation Age of Onset  . Cataracts Mother   . Glaucoma Mother   . Heart disease Mother   . Diabetes Father   . Heart attack  Father   . Cataracts Sister   . Lung disease Sister   . Cataracts Brother   . Diabetes Brother   . Heart disease Brother   . Macular degeneration Maternal Aunt   . Diabetes Maternal Grandfather   . Heart disease Sister   . Diabetes Sister   . Throat cancer Son   . Breast cancer Niece   . Breast cancer Niece   . Amblyopia Neg Hx   . Blindness Neg Hx   . Retinal detachment Neg Hx   . Strabismus Neg Hx   . Retinitis pigmentosa Neg Hx     Social History   Tobacco Use  . Smoking status: Never Smoker  . Smokeless tobacco: Never Used  Vaping Use  . Vaping Use: Never used  Substance Use Topics  . Alcohol use: Not Currently  . Drug use: Never    Medications: I have reviewed the patient's current medications. Allergies as of 10/31/2019      Reactions   Sulfa Antibiotics Anaphylaxis, Swelling   Per pt, can use creams with sulfa in it      Medication List       Accurate as of October 31, 2019 10:06 AM. If you have any questions, ask your nurse or doctor.        ALPRAZolam 0.5 MG tablet  Commonly known as: XANAX Take 1 tablet (0.5 mg total) by mouth at bedtime as needed for anxiety or sleep.   aspirin 81 MG EC tablet Take 1 tablet (81 mg total) by mouth daily.   furosemide 20 MG tablet Commonly known as: LASIX Take 1 tablet (20 mg total) by mouth daily. Can take an extra tablet for weight gain or edema.   gabapentin 100 MG capsule Commonly known as: NEURONTIN Take 100-200 mg by mouth See admin instructions. Take 200mg  in the AM and 100mg  in the PM.   silver sulfADIAZINE 1 % cream Commonly known as: SILVADENE Apply to affected area daily   simvastatin 40 MG tablet Commonly known as: ZOCOR Take 40 mg by mouth daily.   traZODone 50 MG tablet Commonly known as: DESYREL Take 50 mg by mouth at bedtime.   Tyler Aas FlexTouch 200 UNIT/ML FlexTouch Pen Generic drug: insulin degludec Inject 20 Units into the skin daily before breakfast.   Trintellix 10 MG Tabs  tablet Generic drug: vortioxetine HBr Take 10 mg by mouth daily.   Trulicity 4.09 WJ/1.9JY Sopn Generic drug: Dulaglutide Inject 0.75 mg into the muscle every Sunday.        ROS:  A comprehensive review of systems was negative except for: Gastrointestinal: positive for anal pain and fecal incontinence  Blood pressure 122/73, pulse 77, temperature 97.8 F (36.6 C), temperature source Other (Comment), resp. rate 16, height 5' 4.5" (1.638 m), weight 175 lb (79.4 kg), SpO2 98 %. Physical Exam Vitals reviewed.  Constitutional:      Appearance: She is normal weight.  HENT:     Head: Normocephalic.     Nose: Nose normal.     Mouth/Throat:     Mouth: Mucous membranes are moist.  Eyes:     Extraocular Movements: Extraocular movements intact.  Cardiovascular:     Rate and Rhythm: Normal rate and regular rhythm.  Pulmonary:     Effort: Pulmonary effort is normal.     Breath sounds: Normal breath sounds.  Abdominal:     General: There is no distension.     Palpations: Abdomen is soft.     Tenderness: There is no abdominal tenderness.  Genitourinary:    Comments: Anterior anal ulceration /mass like area remains but slightly decreased in size, continued raw irritation of the surrounding anal skin, internal exam with pain and some degree of stenosis, no obvious mass internally aside from the visualized mass like area anteriorly at the verge  Musculoskeletal:        General: No swelling. Normal range of motion.     Cervical back: Normal range of motion.     Comments: Left hand amputated   Skin:    General: Skin is warm and dry.  Neurological:     General: No focal deficit present.     Mental Status: She is alert and oriented to person, place, and time.  Psychiatric:        Mood and Affect: Mood normal.        Behavior: Behavior normal.        Thought Content: Thought content normal.        Judgment: Judgment normal.     Results: PET scan personally reviewed- low grade uptake  in the anal region, no metastatic disease   Assessment & Plan:  Jacqueline Bird is a 69 y.o. female with anal cancer who has completed treatment. She says that she has pain and fecal incontinence. She has a continued area in the anterior  anal region that is ulcerated and mass like concerning for continued disease.   -Recommend exam under anesthesia and biopsy. Discussed risk of bleeding, infection, failure to heal, pain after biopsy, and discussed potential need for further surgery regarding her anal cancer.    All questions were answered to the satisfaction of the patient.    Virl Cagey 10/31/2019, 10:06 AM

## 2019-11-03 ENCOUNTER — Encounter: Payer: Self-pay | Admitting: General Surgery

## 2019-11-03 NOTE — H&P (Signed)
Chief Complaint    Follow-up      Jacqueline Bird is a 69 y.o. female.  HPI: Jacqueline Bird is a very sweet 69 yo who is known to me for anal cancer Stage IIIc. She has undergone treatment and has surpassed 6 months after her radiation which should get her to her final results. She has had a PET scan that demonstrates some minor uptake in the anal region that could be physiologic versus continued disease. She has lost her sister recently which was unexpected and delayed her coming to see me. She continues to have severe anal irritation and burning pain. She is raw and has leakage of stool. She is here today for an exam and determine if we need to do additional biopsies to see if she has residual disease and would need further surgical intervention.   Past Medical History:  Diagnosis Date  . 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 Cervical cancer ([place])   . Depression   . Essential hypertension   . History of cervical cancer   . Mixed hyperlipidemia   . Type 2 diabetes mellitus ([place])     Past Surgical History:  Procedure Laterality Date  . CATARACT EXTRACTION, BILATERAL Right 05/2017  . CERVICAL CONE BIOPSY    . HAND AMPUTATION Left   . PORTACATH PLACEMENT Left 02/08/2019   Procedure: INSERTION PORT-A-CATH;  Surgeon: Virl Cagey, MD;  Location: [facility];  Service: General;  Laterality: Left;  . RECTAL BIOPSY  01/26/2019   Procedure: BIOPSY OF ANAL MASS;  Surgeon: Virl Cagey, MD;  Location: [facility];  Service: General;;  . RECTAL EXAM UNDER ANESTHESIA N/A 01/26/2019   Procedure: RECTAL EXAM UNDER ANESTHESIA;  Surgeon: Virl Cagey, MD;  Location: [facility];  Service: General;  Laterality: N/A;    Family History  Problem Relation Age of Onset  . Cataracts Mother   . Glaucoma Mother   . Heart disease Mother   . Diabetes Father   . Heart attack Father   . Cataracts Sister   . Lung disease Sister     . Cataracts Brother   . Diabetes Brother   . Heart disease Brother   . Macular degeneration Maternal Aunt   . Diabetes Maternal Grandfather   . Heart disease Sister   . Diabetes Sister   . Throat cancer Son   . Breast cancer Niece   . Breast cancer Niece   . Amblyopia Neg Hx   . Blindness Neg Hx   . Retinal detachment Neg Hx   . Strabismus Neg Hx   . Retinitis pigmentosa Neg Hx     Social History   Tobacco Use  . Smoking status: Never Smoker  . Smokeless tobacco: Never Used  Vaping Use  . Vaping Use: Never used  Substance Use Topics  . Alcohol use: Not Currently  . Drug use: Never    Medications: I have reviewed the patient's current medications. Allergies as of 10/31/2019      Reactions   Sulfa Antibiotics Anaphylaxis, Swelling   Per pt, can use creams with sulfa in it      Medication List       Accurate as of October 31, 2019 10:06 AM. If you have any questions, ask your nurse or doctor.        ALPRAZolam 0.5 MG tablet Commonly known as: XANAX Take 1 tablet (  0.5 mg total) by mouth at bedtime as needed for anxiety or sleep.   aspirin 81 MG EC tablet Take 1 tablet (81 mg total) by mouth daily.   furosemide 20 MG tablet Commonly known as: LASIX Take 1 tablet (20 mg total) by mouth daily. Can take an extra tablet for weight gain or edema.   gabapentin 100 MG capsule Commonly known as: NEURONTIN Take 100-200 mg by mouth See admin instructions. Take 200mg  in the AM and 100mg  in the PM.   silver sulfADIAZINE 1 % cream Commonly known as: SILVADENE Apply to affected area daily   simvastatin 40 MG tablet Commonly known as: ZOCOR Take 40 mg by mouth daily.   traZODone 50 MG tablet Commonly known as: DESYREL Take 50 mg by mouth at bedtime.   Tyler Aas FlexTouch 200 UNIT/ML FlexTouch Pen Generic drug: insulin degludec Inject 20 Units into the skin daily before breakfast.   Trintellix 10 MG Tabs tablet Generic drug: vortioxetine HBr Take 10 mg by mouth  daily.   Trulicity 4.33 IR/5.1OA Sopn Generic drug: Dulaglutide Inject 0.75 mg into the muscle every Sunday.        ROS:  A comprehensive review of systems was negative except for: Gastrointestinal: positive for anal pain and fecal incontinence  Blood pressure 122/73, pulse 77, temperature 97.8 F (36.6 C), temperature source Other (Comment), resp. rate 16, height 5' 4.5" (1.638 m), weight 175 lb (79.4 kg), SpO2 98 %. Physical Exam Vitals reviewed.  Constitutional:      Appearance: She is normal weight.  HENT:     Head: Normocephalic.     Nose: Nose normal.     Mouth/Throat:     Mouth: Mucous membranes are moist.  Eyes:     Extraocular Movements: Extraocular movements intact.  Cardiovascular:     Rate and Rhythm: Normal rate and regular rhythm.  Pulmonary:     Effort: Pulmonary effort is normal.     Breath sounds: Normal breath sounds.  Abdominal:     General: There is no distension.     Palpations: Abdomen is soft.     Tenderness: There is no abdominal tenderness.  Genitourinary:    Comments: Anterior anal ulceration /mass like area remains but slightly decreased in size, continued raw irritation of the surrounding anal skin, internal exam with pain and some degree of stenosis, no obvious mass internally aside from the visualized mass like area anteriorly at the verge  Musculoskeletal:        General: No swelling. Normal range of motion.     Cervical back: Normal range of motion.     Comments: Left hand amputated   Skin:    General: Skin is warm and dry.  Neurological:     General: No focal deficit present.     Mental Status: She is alert and oriented to person, place, and time.  Psychiatric:        Mood and Affect: Mood normal.        Behavior: Behavior normal.        Thought Content: Thought content normal.        Judgment: Judgment normal.     Results: PET scan personally reviewed- low grade uptake in the anal region, no metastatic disease   Assessment &  Plan:  Jacqueline Bird is a 69 y.o. female with anal cancer who has completed treatment. She says that she has pain and fecal incontinence. She has a continued area in the anterior anal region that is ulcerated and mass  like concerning for continued disease.   -Recommend exam under anesthesia and biopsy. Discussed risk of bleeding, infection, failure to heal, pain after biopsy, and discussed potential need for further surgery regarding her anal cancer.    All questions were answered to the satisfaction of the patient.    Virl Cagey 10/31/2019, 10:06 AM

## 2019-11-08 ENCOUNTER — Other Ambulatory Visit: Payer: Self-pay

## 2019-11-08 ENCOUNTER — Encounter (HOSPITAL_COMMUNITY): Payer: Self-pay

## 2019-11-09 ENCOUNTER — Encounter (HOSPITAL_COMMUNITY)
Admission: RE | Admit: 2019-11-09 | Discharge: 2019-11-09 | Disposition: A | Discharge status: Home / Self Care | Payer: Medicare HMO | Source: Ambulatory Visit | Attending: General Surgery | Admitting: General Surgery

## 2019-11-10 ENCOUNTER — Other Ambulatory Visit (HOSPITAL_COMMUNITY)
Admission: RE | Admit: 2019-11-10 | Discharge: 2019-11-10 | Disposition: A | Discharge status: Home / Self Care | Payer: Medicare HMO | Source: Ambulatory Visit | Attending: General Surgery | Admitting: General Surgery

## 2019-11-10 ENCOUNTER — Other Ambulatory Visit: Payer: Self-pay

## 2019-11-10 DIAGNOSIS — Z01812 Encounter for preprocedural laboratory examination: Secondary | ICD-10-CM | POA: Diagnosis present

## 2019-11-10 DIAGNOSIS — Z20822 Contact with and (suspected) exposure to covid-19: Secondary | ICD-10-CM | POA: Insufficient documentation

## 2019-11-11 LAB — SARS CORONAVIRUS 2 (TAT 6-24 HRS): SARS Coronavirus 2: NEGATIVE

## 2019-11-13 ENCOUNTER — Ambulatory Visit (HOSPITAL_COMMUNITY): Payer: Medicare HMO | Admitting: Anesthesiology

## 2019-11-13 ENCOUNTER — Ambulatory Visit (HOSPITAL_COMMUNITY)
Admission: RE | Admit: 2019-11-13 | Discharge: 2019-11-13 | Disposition: A | Discharge status: Home / Self Care | Payer: Medicare HMO | Attending: General Surgery | Admitting: General Surgery

## 2019-11-13 ENCOUNTER — Other Ambulatory Visit: Payer: Self-pay

## 2019-11-13 ENCOUNTER — Encounter (HOSPITAL_COMMUNITY)
Admission: RE | Disposition: A | Discharge status: Home / Self Care | Payer: Self-pay | Source: Home / Self Care | Attending: General Surgery

## 2019-11-13 ENCOUNTER — Encounter (HOSPITAL_COMMUNITY): Payer: Self-pay | Admitting: General Surgery

## 2019-11-13 DIAGNOSIS — Z8541 Personal history of malignant neoplasm of cervix uteri: Secondary | ICD-10-CM | POA: Diagnosis not present

## 2019-11-13 DIAGNOSIS — I509 Heart failure, unspecified: Secondary | ICD-10-CM | POA: Diagnosis not present

## 2019-11-13 DIAGNOSIS — Z923 Personal history of irradiation: Secondary | ICD-10-CM | POA: Insufficient documentation

## 2019-11-13 DIAGNOSIS — Z9221 Personal history of antineoplastic chemotherapy: Secondary | ICD-10-CM | POA: Diagnosis not present

## 2019-11-13 DIAGNOSIS — R159 Full incontinence of feces: Secondary | ICD-10-CM | POA: Diagnosis not present

## 2019-11-13 DIAGNOSIS — E782 Mixed hyperlipidemia: Secondary | ICD-10-CM | POA: Insufficient documentation

## 2019-11-13 DIAGNOSIS — I110 Hypertensive heart disease with heart failure: Secondary | ICD-10-CM | POA: Insufficient documentation

## 2019-11-13 DIAGNOSIS — C210 Malignant neoplasm of anus, unspecified: Secondary | ICD-10-CM | POA: Diagnosis present

## 2019-11-13 DIAGNOSIS — F329 Major depressive disorder, single episode, unspecified: Secondary | ICD-10-CM | POA: Diagnosis not present

## 2019-11-13 DIAGNOSIS — E119 Type 2 diabetes mellitus without complications: Secondary | ICD-10-CM | POA: Insufficient documentation

## 2019-11-13 HISTORY — PX: RECTAL BIOPSY: SHX2303

## 2019-11-13 HISTORY — PX: RECTAL EXAM UNDER ANESTHESIA: SHX6399

## 2019-11-13 LAB — GLUCOSE, CAPILLARY
Glucose-Capillary: 207 mg/dL — ABNORMAL HIGH (ref 70–99)
Glucose-Capillary: 220 mg/dL — ABNORMAL HIGH (ref 70–99)

## 2019-11-13 SURGERY — EXAM UNDER ANESTHESIA, RECTUM
Anesthesia: General

## 2019-11-13 MED ORDER — OXYCODONE HCL 10 MG PO TABS: 5.0000 mg | ORAL_TABLET | ORAL | 0 refills | Status: AC | PRN

## 2019-11-13 MED ORDER — CHLORHEXIDINE GLUCONATE CLOTH 2 % EX PADS: 6.0000 | MEDICATED_PAD | Freq: Once | CUTANEOUS | Status: DC

## 2019-11-13 MED ORDER — BUPIVACAINE LIPOSOME 1.3 % IJ SUSP
INTRAMUSCULAR | Status: DC | PRN
  Administered 2019-11-13: 20 mL

## 2019-11-13 MED ORDER — ORAL CARE MOUTH RINSE: 15.0000 mL | Freq: Once | OROMUCOSAL | Status: AC

## 2019-11-13 MED ORDER — LIDOCAINE VISCOUS HCL 2 % MT SOLN
OROMUCOSAL | Status: DC | PRN
  Administered 2019-11-13: 1 via OROMUCOSAL

## 2019-11-13 MED ORDER — MIDAZOLAM HCL 2 MG/2ML IJ SOLN
INTRAMUSCULAR | Status: DC | PRN
  Administered 2019-11-13: 1 mg via INTRAVENOUS

## 2019-11-13 MED ORDER — MIDAZOLAM HCL 2 MG/2ML IJ SOLN
INTRAMUSCULAR | Status: AC
  Filled 2019-11-13: qty 2

## 2019-11-13 MED ORDER — ONDANSETRON HCL 4 MG/2ML IJ SOLN
INTRAMUSCULAR | Status: DC | PRN
  Administered 2019-11-13: 4 mg via INTRAVENOUS

## 2019-11-13 MED ORDER — PROPOFOL 10 MG/ML IV BOLUS
INTRAVENOUS | Status: DC | PRN
  Administered 2019-11-13 (×2): 50 mg via INTRAVENOUS
  Administered 2019-11-13: 30 mg via INTRAVENOUS

## 2019-11-13 MED ORDER — CHLORHEXIDINE GLUCONATE 0.12 % MT SOLN
15.0000 mL | Freq: Once | OROMUCOSAL | Status: AC
  Administered 2019-11-13: 15 mL via OROMUCOSAL

## 2019-11-13 MED ORDER — OXYCODONE HCL 5 MG PO TABS: 5.0000 mg | ORAL_TABLET | ORAL | 0 refills | Status: DC | PRN

## 2019-11-13 MED ORDER — SODIUM CHLORIDE 0.9 % IR SOLN
Status: DC | PRN
  Administered 2019-11-13: 1000 mL

## 2019-11-13 MED ORDER — FENTANYL CITRATE (PF) 100 MCG/2ML IJ SOLN: 25.0000 ug | INTRAMUSCULAR | Status: DC | PRN

## 2019-11-13 MED ORDER — PROPOFOL 500 MG/50ML IV EMUL
INTRAVENOUS | Status: DC | PRN
Start: 2019-11-13 — End: 2019-11-13
  Administered 2019-11-13: 100 ug/kg/min via INTRAVENOUS

## 2019-11-13 MED ORDER — FENTANYL CITRATE (PF) 100 MCG/2ML IJ SOLN
INTRAMUSCULAR | Status: AC
  Filled 2019-11-13: qty 2

## 2019-11-13 MED ORDER — LIDOCAINE HCL (CARDIAC) PF 100 MG/5ML IV SOSY
PREFILLED_SYRINGE | INTRAVENOUS | Status: DC | PRN
  Administered 2019-11-13: 40 mg via INTRATRACHEAL

## 2019-11-13 MED ORDER — SODIUM CHLORIDE 0.9 % IV SOLN
2.0000 g | INTRAVENOUS | Status: AC
  Administered 2019-11-13: 2 g via INTRAVENOUS

## 2019-11-13 MED ORDER — CHLORHEXIDINE GLUCONATE 0.12 % MT SOLN
OROMUCOSAL | Status: AC
  Filled 2019-11-13: qty 15

## 2019-11-13 MED ORDER — LACTATED RINGERS IV SOLN: INTRAVENOUS | Status: DC

## 2019-11-13 MED ORDER — ONDANSETRON HCL 4 MG/2ML IJ SOLN: 4.0000 mg | Freq: Once | INTRAMUSCULAR | Status: DC | PRN

## 2019-11-13 MED ORDER — FENTANYL CITRATE (PF) 100 MCG/2ML IJ SOLN
INTRAMUSCULAR | Status: DC | PRN
  Administered 2019-11-13: 50 ug via INTRAVENOUS

## 2019-11-13 MED ORDER — SODIUM CHLORIDE 0.9 % IV SOLN
INTRAVENOUS | Status: AC
  Filled 2019-11-13: qty 2

## 2019-11-13 MED ORDER — PROPOFOL 10 MG/ML IV BOLUS
INTRAVENOUS | Status: AC
  Filled 2019-11-13: qty 40

## 2019-11-13 SURGICAL SUPPLY — 31 items
BAG HAMPER (MISCELLANEOUS) ×2 IMPLANT
CLOTH BEACON ORANGE TIMEOUT ST (SAFETY) ×2 IMPLANT
COVER LIGHT HANDLE STERIS (MISCELLANEOUS) ×4 IMPLANT
COVER WAND RF STERILE (DRAPES) ×2 IMPLANT
DRAPE HALF SHEET 40X57 (DRAPES) ×2 IMPLANT
DRSG TELFA 3X8 NADH (GAUZE/BANDAGES/DRESSINGS) ×2 IMPLANT
ELECT REM PT RETURN 9FT ADLT (ELECTROSURGICAL) ×2
ELECTRODE REM PT RTRN 9FT ADLT (ELECTROSURGICAL) ×1 IMPLANT
GAUZE SPONGE 4X4 12PLY STRL (GAUZE/BANDAGES/DRESSINGS) ×2 IMPLANT
GLOVE BIO SURGEON STRL SZ 6.5 (GLOVE) ×4 IMPLANT
GLOVE BIOGEL PI IND STRL 6.5 (GLOVE) ×1 IMPLANT
GLOVE BIOGEL PI IND STRL 7.0 (GLOVE) ×2 IMPLANT
GLOVE BIOGEL PI INDICATOR 6.5 (GLOVE) ×1
GLOVE BIOGEL PI INDICATOR 7.0 (GLOVE) ×2
GOWN STRL REUS W/TWL LRG LVL3 (GOWN DISPOSABLE) ×4 IMPLANT
HEMOSTAT SURGICEL 4X8 (HEMOSTASIS) ×2 IMPLANT
KIT TURNOVER CYSTO (KITS) ×2 IMPLANT
MANIFOLD NEPTUNE II (INSTRUMENTS) ×2 IMPLANT
NEEDLE HYPO 18GX1.5 BLUNT FILL (NEEDLE) ×2 IMPLANT
NEEDLE HYPO 21X1.5 SAFETY (NEEDLE) ×2 IMPLANT
NS IRRIG 1000ML POUR BTL (IV SOLUTION) ×2 IMPLANT
PACK PERI GYN (CUSTOM PROCEDURE TRAY) ×2 IMPLANT
PAD ARMBOARD 7.5X6 YLW CONV (MISCELLANEOUS) ×2 IMPLANT
SET BASIN LINEN APH (SET/KITS/TRAYS/PACK) ×2 IMPLANT
SHEARS HARMONIC 9CM CVD (BLADE) IMPLANT
SPONGE SURGIFOAM ABS GEL 100 (HEMOSTASIS) ×2 IMPLANT
SURGILUBE 2OZ TUBE FLIPTOP (MISCELLANEOUS) ×2 IMPLANT
SUT SILK 0 FSL (SUTURE) ×2 IMPLANT
SUT VIC AB 2-0 CT2 27 (SUTURE) IMPLANT
SYR 20ML LL LF (SYRINGE) ×2 IMPLANT
SYR BULB IRRIG 60ML STRL (SYRINGE) ×2 IMPLANT

## 2019-11-13 NOTE — Anesthesia Postprocedure Evaluation (Signed)
Anesthesia Post Note  Patient: Jacqueline Bird  Procedure(s) Performed: RECTAL EXAM UNDER ANESTHESIA  WITH ANAL BIOPSY (N/A ) BIOPSY ANAL AREA (N/A )  Anesthesia Type: General   No complications documented.   Last Vitals:  Vitals:   11/13/19 1015 11/13/19 1030  BP: 116/66 (!) 140/96  Pulse: (!) 107 (!) 103  Resp: 20 20  Temp:    SpO2: 93% 92%    Last Pain:  Vitals:   11/13/19 0832  TempSrc: Oral  PainSc: 0-No pain                 Louann Sjogren

## 2019-11-13 NOTE — Progress Notes (Signed)
[facility]  Notified Karle Starch, fiance that surgery is competed. Prescriptions sent. Will call with results.  Curlene Labrum, MD [facility] [address] [place], [place] [address] [phone] (office)

## 2019-11-13 NOTE — Op Note (Signed)
[facility] Operative Note  11/13/19  Preoperative Diagnosis: Anal cancer s/p chemoradiation, continued anal ulceration/ mass    Postoperative Diagnosis: Same   Procedure(s) Performed: Exam under anesthesia, perianal /anal biopsy    Surgeon: Lanell Matar. Constance Haw, MD   Assistants: No qualified resident was available    Anesthesia: Monitored anesthesia care    Anesthesiologist: Louann Sjogren, MD    Specimens:  Perianal biopsies    Estimated Blood Loss: Minimal   Blood Replacement: None    Complications: None   Wound Class: Contaminated    Operative Indications: Ms. Heiler is a very sweet 69 yo with a history of anal cancer s/p treatment who has some residual mass/ ulceration of the anterior perianal area and some enhancement on PET scan she had in follow up to metastatic disease.  She has issues with pain and incontinence after her radiation, and continues to have burning and irritation in her anal area. On exam in the office, she had a continued mass like ulcerated area anteriorly that needs biopsy to determine if this is fibrosis/ radiation scarring versus continued disease and need for formal resection.   Findings: Anterior anal mass like area with ulceration   Procedure: The patient was taken to the operating room and placed supine. Monitored anesthesia care was induced. Intravenous antibiotics were administered per protocol.  The perianal region was prepared and draped in the usual sterile fashion.   Internal exam revealed some stenosis of the anal canal and a mass like rigid/ shelf anteriorly. The external component has a continued ulcerated mass appearance with some woody fibrosis from radiation anteriorly and some minor radiation skin changes around the remaining perianal skin.   Using punch biopsy set, 3 random areas of the anterior residual perianal anal mass were biopsied in addition to the internal anoderm on the anterior shelf like component. These  were all sent together as one specimen for evaluation.  Exparel was injected.  Hemostasis was obtained. A rolled Gelfoam with Surgicel and viscus lidocaine was inserted.  Final inspection revealed acceptable hemostasis. All counts were correct at the end of the case. The patient was awakened from anesthesia without complication.  The patient went to the PACU in stable condition.   Curlene Labrum, MD [facility] [address] [place], [place] [address] [phone] (office)

## 2019-11-13 NOTE — Progress Notes (Signed)
[facility]  [facility] called and no 5 mg roxicodone. Sent in for 10 mg to give 1/2 tablet per dose.  Curlene Labrum, MD [facility] [address] [place], [place] [address] [phone] (office)

## 2019-11-13 NOTE — Anesthesia Preprocedure Evaluation (Signed)
Anesthesia Evaluation  Patient identified by MRN, date of birth, ID band Patient awake    Reviewed: Allergy & Precautions, H&P , NPO status , Patient's Chart, lab work & pertinent test results, reviewed documented beta blocker date and time   Airway Mallampati: II  TM Distance: >3 FB Neck ROM: full    Dental  (+) Partial Lower   Pulmonary neg pulmonary ROS,    Pulmonary exam normal breath sounds clear to auscultation       Cardiovascular Exercise Tolerance: Good hypertension, +CHF   Rhythm:regular Rate:Normal     Neuro/Psych PSYCHIATRIC DISORDERS Anxiety Depression negative neurological ROS     GI/Hepatic negative GI ROS, Neg liver ROS,   Endo/Other  negative endocrine ROSdiabetes  Renal/GU negative Renal ROS  negative genitourinary   Musculoskeletal   Abdominal   Peds  Hematology negative hematology ROS (+)   Anesthesia Other Findings   Reproductive/Obstetrics negative OB ROS                             Anesthesia Physical Anesthesia Plan  ASA: III  Anesthesia Plan: General   Post-op Pain Management:    Induction:   PONV Risk Score and Plan: 3 and Propofol infusion  Airway Management Planned:   Additional Equipment:   Intra-op Plan:   Post-operative Plan:   Informed Consent: I have reviewed the patients History and Physical, chart, labs and discussed the procedure including the risks, benefits and alternatives for the proposed anesthesia with the patient or authorized representative who has indicated his/her understanding and acceptance.     Dental Advisory Given  Plan Discussed with: CRNA  Anesthesia Plan Comments:         Anesthesia Quick Evaluation

## 2019-11-13 NOTE — Interval H&P Note (Signed)
History and Physical Interval Note:  11/13/2019 8:43 AM  Jacqueline Bird  has presented today for surgery, with the diagnosis of Anal Cancer.  The various methods of treatment have been discussed with the patient and family. After consideration of risks, benefits and other options for treatment, the patient has consented to  Procedure(s): RECTAL EXAM UNDER ANESTHESIA (N/A) BIOPSY ANAL AREA (N/A) as a surgical intervention.  The patient's history has been reviewed, patient examined, no change in status, stable for surgery.  I have reviewed the patient's chart and labs.  Questions were answered to the patient's satisfaction.    No changes. No questions.   Virl Cagey

## 2019-11-13 NOTE — Discharge Instructions (Signed)
Discharge Instructions: Please take your roxicodone as prescribed and alternate with tylenol every 4-6 hours.   You are likely to have some bleeding from the area.  Please keep the area clean and dry and take Sitz baths (swallow warm water baths) for comfort and after bowel movements.  If you cannot get in a bath tub, you can purchase a Sitz bath that goes on the toilet at the pharmacy.  Please keep your stools soft and take fiber daily (metamucil) and colace (over the counter) to help prevent constipation.  If you have not had a BM in 2 days, please take Miralax, and if you have not had a BM after this, please notify Dr. Constance Haw.  Expect some bleeding following the hemorrhoid surgery and significant pain.  Go to the ED with extensive bleeding (soaking 2 large pads in < 1 hour), fevers, or chills.    Shower per your regular routine daily.   Rest and listen to your body, but do not remain in bed all day.  Walk everyday for at least 15-20 minutes. Deep cough and move around every 1-2 hours in the first few days after surgery.  Do not lift > 10 lbs, perform excessive bending, pushing, pulling, squatting. Some nausea is common and poor appetite. The main goal is to stay hydrated the first few days after surgery.   Pain Expectations and Narcotics: -After surgery you will have pain associated with your surgery and this is normal. The pain is muscular and nerve pain, and will get better with time. -You are encouraged and expected to take non narcotic medications like tylenol and ibuprofen (when able) to treat pain as multiple modalities can aid with pain treatment. -Narcotics are only used when pain is severe or there is breakthrough pain. -You are not expected to have a pain score of 0 after surgery, as we cannot prevent pain. A pain score of 3-4 that allows you to be functional, move, walk, and tolerate some activity is the goal. The pain will continue to improve over the days after surgery and is  dependent on your surgery. -Due to [place] law, we are only able to give a certain amount of pain medication to treat post operative pain, and we only give additional narcotics on a patient by patient basis.  -For most laparoscopic surgery, studies have shown that the majority of patients only need 10-15 narcotic pills, and for open surgeries or anal surgeries most patients only need 15-20.   -Having appropriate expectations of pain and knowledge of pain management with non narcotics is important as we do not want anyone to become addicted to narcotic pain medication.  -Doing your Sitz baths will help with pain and pressure discomfort.  -Simple acts like meditation and mindfulness practices after surgery can also help with pain control and research has proven the benefit of these practices.  Contact Information: If you have questions or concerns, please call our office, [phone], Monday- Thursday 8AM-5PM and Friday 8AM-12Noon.  If it is after hours or on the weekend, please call [facility] Main Number, [phone], and ask to speak to the surgeon on call for Dr. Constance Haw at [facility].

## 2019-11-13 NOTE — Transfer of Care (Signed)
Immediate Anesthesia Transfer of Care Note  Patient: Jacqueline Bird  Procedure(s) Performed: RECTAL EXAM UNDER ANESTHESIA  WITH ANAL BIOPSY (N/A ) BIOPSY ANAL AREA (N/A )  Patient Location: PACU  Anesthesia Type:MAC  Level of Consciousness: awake, alert  and oriented  Airway & Oxygen Therapy: Patient Spontanous Breathing  Post-op Assessment: Report given to RN, Post -op Vital signs reviewed and stable and Patient moving all extremities X 4  Post vital signs: Reviewed and stable  Last Vitals:  Vitals Value Taken Time  BP    Temp    Pulse 102 11/13/19 1003  Resp 25 11/13/19 1004  SpO2 86 % 11/13/19 1003  Vitals shown include unvalidated device data.  Last Pain:  Vitals:   11/13/19 0832  TempSrc: Oral  PainSc: 0-No pain      Patients Stated Pain Goal: 9 (41/28/20 8138)  Complications: No complications documented.

## 2019-11-14 ENCOUNTER — Encounter (HOSPITAL_COMMUNITY): Payer: Self-pay | Admitting: General Surgery

## 2019-11-15 LAB — SURGICAL PATHOLOGY

## 2019-11-16 ENCOUNTER — Telehealth (INDEPENDENT_AMBULATORY_CARE_PROVIDER_SITE_OTHER): Payer: Self-pay | Admitting: General Surgery

## 2019-11-16 DIAGNOSIS — C210 Malignant neoplasm of anus, unspecified: Secondary | ICD-10-CM

## 2019-11-16 NOTE — Telephone Encounter (Signed)
[facility]  FINAL MICROSCOPIC DIAGNOSIS:   A. ANUS, BIOPSY:  - Benign inflamed squamous mucosa with ulceration  - No malignancy identified  - See comment   COMMENT:   HSVI/HSVII and CMV immunohistochemistry are NEGATIVE. Dr. Vic Ripper reviewed the case and agrees with the above diagnosis.   Notified patient of results. Likely this is from the radiation reaction with the inflammation. Will need to continue to follow up for evaluation and exams/ checks.   Curlene Labrum, MD [facility] [address] [place], [place] [address] [phone] (office)

## 2019-11-30 ENCOUNTER — Ambulatory Visit: Payer: Medicare HMO | Admitting: General Surgery

## 2020-02-13 ENCOUNTER — Other Ambulatory Visit (HOSPITAL_COMMUNITY): Payer: Self-pay

## 2020-02-13 DIAGNOSIS — C210 Malignant neoplasm of anus, unspecified: Secondary | ICD-10-CM

## 2020-02-26 ENCOUNTER — Other Ambulatory Visit (HOSPITAL_COMMUNITY): Payer: Medicare HMO

## 2020-02-27 ENCOUNTER — Other Ambulatory Visit (HOSPITAL_COMMUNITY): Payer: Medicare HMO

## 2020-02-27 ENCOUNTER — Ambulatory Visit (HOSPITAL_COMMUNITY): Payer: Medicare HMO | Admitting: Hematology

## 2020-02-28 ENCOUNTER — Other Ambulatory Visit (HOSPITAL_COMMUNITY)
Admission: RE | Admit: 2020-02-28 | Discharge: 2020-02-28 | Disposition: A | Discharge status: Home / Self Care | Payer: Medicare HMO | Source: Ambulatory Visit | Attending: Ophthalmology | Admitting: Ophthalmology

## 2020-02-28 DIAGNOSIS — Z01812 Encounter for preprocedural laboratory examination: Secondary | ICD-10-CM | POA: Insufficient documentation

## 2020-02-28 DIAGNOSIS — Z20822 Contact with and (suspected) exposure to covid-19: Secondary | ICD-10-CM | POA: Insufficient documentation

## 2020-02-28 LAB — SARS CORONAVIRUS 2 (TAT 6-24 HRS): SARS Coronavirus 2: NEGATIVE

## 2020-02-28 NOTE — Progress Notes (Signed)
PCP:  Jacqueline Neighbors, MD Cardiologist:  Dr. Rozann Lesches  EKG:  10/17/19 CXR:  10/16/19 ECHO:  05/22/19 Stress Test:  Denies Cardiac Cath:  Denies  Fasting Blood Sugar-  100-250 Checks Blood Sugar__1_ times a day  Anesthesia Review:  Yes, Cardiac history.   Patient denies shortness of breath, fever, cough, and chest pain at PAT appointment.  Patient verbalized understanding of instructions provided today at the PAT appointment.  Patient asked to review instructions at home and day of surgery.

## 2020-02-29 NOTE — Progress Notes (Addendum)
Anesthesia Chart Review: Same day workup  Follows with cardiology for hx of diastolic CHF and risk factor modification. Last seen by Dr. Domenic Polite 10/06/19. Stable per note. No anginal symptoms. Recommended 6 month followup.   IDDM 2, last A1c in epic was 5.7 on 05/22/2019.  Will need day of surgery labs and evaluation.  EKG 10/16/2019: Sinus rhythm with frequent Premature ventricular complexes.  Rate 87. Nonspecific T wave abnormality Prolonged QT (QTC 486)  TTE 05/22/2019: 1. Left ventricular ejection fraction, by visual estimation, is 50 to  55%. The left ventricle has low normal function. There is no left  ventricular hypertrophy.  2. Basal inferolateral segment and basal inferior segment are abnormal.  3. Left ventricular diastolic parameters are consistent with Grade I  diastolic dysfunction (impaired relaxation).  4. The left ventricle demonstrates regional wall motion abnormalities.  5. Global right ventricle has normal systolic function.The right  ventricular size is normal. No increase in right ventricular wall  thickness.  6. Left atrial size was moderately dilated.  7. Right atrial size was normal.  8. The mitral valve is grossly normal. Mild mitral valve regurgitation.  9. The tricuspid valve is grossly normal.  10. The aortic valve is tricuspid. Aortic valve regurgitation is not  visualized.  11. The pulmonic valve was grossly normal. Pulmonic valve regurgitation is  trivial.  12. Mildly elevated pulmonary artery systolic pressure.  13. The tricuspid regurgitant velocity is 2.30 m/s, and with an assumed  right atrial pressure of 15 mmHg, the estimated right ventricular systolic  pressure is mildly elevated at 36.2 mmHg.  14. The inferior vena cava is dilated in size with <50% respiratory  variability, suggesting right atrial pressure of 15 mmHg.    Jacqueline Bird [facility] Short Stay Center/Anesthesiology Phone [phone] 02/29/2020 12:59 PM

## 2020-02-29 NOTE — Anesthesia Preprocedure Evaluation (Addendum)
Anesthesia Evaluation  Patient identified by MRN, date of birth, ID band Patient awake    Reviewed: Allergy & Precautions, NPO status , Patient's Chart, lab work & pertinent test results  Airway Mallampati: II  TM Distance: >3 FB     Dental   Pulmonary neg pulmonary ROS,    breath sounds clear to auscultation       Cardiovascular hypertension, +CHF   Rhythm:Regular Rate:Normal     Neuro/Psych Anxiety Depression negative neurological ROS     GI/Hepatic negative GI ROS, Neg liver ROS,   Endo/Other  diabetes  Renal/GU      Musculoskeletal   Abdominal   Peds  Hematology   Anesthesia Other Findings   Reproductive/Obstetrics                           Anesthesia Physical Anesthesia Plan  ASA: III  Anesthesia Plan: General   Post-op Pain Management:    Induction: Intravenous  PONV Risk Score and Plan: 3 and Ondansetron and Dexamethasone  Airway Management Planned: Nasal Cannula and Simple Face Mask  Additional Equipment:   Intra-op Plan:   Post-operative Plan:   Informed Consent: I have reviewed the patients History and Physical, chart, labs and discussed the procedure including the risks, benefits and alternatives for the proposed anesthesia with the patient or authorized representative who has indicated his/her understanding and acceptance.     Dental advisory given  Plan Discussed with: CRNA and Anesthesiologist  Anesthesia Plan Comments: (PAT note by Karoline Caldwell, PA-C: Follows with cardiology for hx of diastolic CHF and risk factor modification. Last seen by Dr. Domenic Polite 10/06/19. Stable per note. No anginal symptoms. Recommended 6 month followup.   IDDM 2, last A1c in epic was 5.7 on 05/22/2019.  Will need day of surgery labs and evaluation.  EKG 10/16/2019: Sinus rhythm with frequent Premature ventricular complexes.  Rate 87. Nonspecific T wave abnormality Prolonged QT (QTC  486)  TTE 05/22/2019: 1. Left ventricular ejection fraction, by visual estimation, is 50 to  55%. The left ventricle has low normal function. There is no left  ventricular hypertrophy.  2. Basal inferolateral segment and basal inferior segment are abnormal.  3. Left ventricular diastolic parameters are consistent with Grade I  diastolic dysfunction (impaired relaxation).  4. The left ventricle demonstrates regional wall motion abnormalities.  5. Global right ventricle has normal systolic function.The right  ventricular size is normal. No increase in right ventricular wall  thickness.  6. Left atrial size was moderately dilated.  7. Right atrial size was normal.  8. The mitral valve is grossly normal. Mild mitral valve regurgitation.  9. The tricuspid valve is grossly normal.  10. The aortic valve is tricuspid. Aortic valve regurgitation is not  visualized.  11. The pulmonic valve was grossly normal. Pulmonic valve regurgitation is  trivial.  12. Mildly elevated pulmonary artery systolic pressure.  13. The tricuspid regurgitant velocity is 2.30 m/s, and with an assumed  right atrial pressure of 15 mmHg, the estimated right ventricular systolic  pressure is mildly elevated at 36.2 mmHg.  14. The inferior vena cava is dilated in size with <50% respiratory  variability, suggesting right atrial pressure of 15 mmHg.  )       Anesthesia Quick Evaluation

## 2020-03-01 ENCOUNTER — Ambulatory Visit (HOSPITAL_COMMUNITY): Payer: Medicare HMO | Admitting: Physician Assistant

## 2020-03-01 ENCOUNTER — Other Ambulatory Visit: Payer: Self-pay

## 2020-03-01 ENCOUNTER — Encounter (HOSPITAL_COMMUNITY)
Admission: RE | Disposition: A | Discharge status: Home / Self Care | Payer: Self-pay | Source: Home / Self Care | Attending: Ophthalmology

## 2020-03-01 ENCOUNTER — Ambulatory Visit (HOSPITAL_COMMUNITY)
Admission: RE | Admit: 2020-03-01 | Discharge: 2020-03-01 | Disposition: A | Discharge status: Home / Self Care | Payer: Medicare HMO | Attending: Ophthalmology | Admitting: Ophthalmology

## 2020-03-01 ENCOUNTER — Encounter (HOSPITAL_COMMUNITY): Payer: Self-pay | Admitting: Ophthalmology

## 2020-03-01 DIAGNOSIS — Z83511 Family history of glaucoma: Secondary | ICD-10-CM | POA: Diagnosis not present

## 2020-03-01 DIAGNOSIS — Z83518 Family history of other specified eye disorder: Secondary | ICD-10-CM | POA: Insufficient documentation

## 2020-03-01 DIAGNOSIS — E113522 Type 2 diabetes mellitus with proliferative diabetic retinopathy with traction retinal detachment involving the macula, left eye: Secondary | ICD-10-CM | POA: Insufficient documentation

## 2020-03-01 HISTORY — PX: PHOTOCOAGULATION WITH LASER: SHX6027

## 2020-03-01 HISTORY — PX: PARS PLANA VITRECTOMY: SHX2166

## 2020-03-01 HISTORY — PX: AIR/FLUID EXCHANGE: SHX6494

## 2020-03-01 HISTORY — PX: REPAIR OF COMPLEX TRACTION RETINAL DETACHMENT: SHX6217

## 2020-03-01 HISTORY — PX: INJECTION OF SILICONE OIL: SHX6422

## 2020-03-01 LAB — CBC
HCT: 43.7 % (ref 36.0–46.0)
Hemoglobin: 14.1 g/dL (ref 12.0–15.0)
MCH: 30.8 pg (ref 26.0–34.0)
MCHC: 32.3 g/dL (ref 30.0–36.0)
MCV: 95.4 fL (ref 80.0–100.0)
Platelets: 212 10*3/uL (ref 150–400)
RBC: 4.58 MIL/uL (ref 3.87–5.11)
RDW: 12.4 % (ref 11.5–15.5)
WBC: 5.5 10*3/uL (ref 4.0–10.5)
nRBC: 0 % (ref 0.0–0.2)

## 2020-03-01 LAB — BASIC METABOLIC PANEL
Anion gap: 11 (ref 5–15)
BUN: 14 mg/dL (ref 8–23)
CO2: 24 mmol/L (ref 22–32)
Calcium: 9.3 mg/dL (ref 8.9–10.3)
Chloride: 103 mmol/L (ref 98–111)
Creatinine, Ser: 0.7 mg/dL (ref 0.44–1.00)
GFR, Estimated: >60 mL/min (ref >60)
Glucose, Bld: 133 mg/dL — ABNORMAL HIGH (ref 70–99)
Potassium: 3.9 mmol/L (ref 3.5–5.1)
Sodium: 138 mmol/L (ref 135–145)

## 2020-03-01 LAB — GLUCOSE, CAPILLARY
Glucose-Capillary: 138 mg/dL — ABNORMAL HIGH (ref 70–99)
Glucose-Capillary: 108 mg/dL — ABNORMAL HIGH (ref 70–99)

## 2020-03-01 SURGERY — REPAIR, RETINAL DETACHMENT, COMPLEX
Anesthesia: General | Site: Eye | Laterality: Left

## 2020-03-01 MED ORDER — EPINEPHRINE PF 1 MG/ML IJ SOLN: INTRAOCULAR | Status: DC | PRN

## 2020-03-01 MED ORDER — TOBRAMYCIN-DEXAMETHASONE 0.3-0.1 % OP OINT
TOPICAL_OINTMENT | OPHTHALMIC | Status: DC | PRN
  Administered 2020-03-01: 1 via OPHTHALMIC

## 2020-03-01 MED ORDER — TRIAMCINOLONE ACETONIDE 40 MG/ML IJ SUSP
INTRAMUSCULAR | Status: DC | PRN
  Administered 2020-03-01: 40 mg

## 2020-03-01 MED ORDER — MIDAZOLAM HCL 2 MG/2ML IJ SOLN
INTRAMUSCULAR | Status: AC
  Filled 2020-03-01: qty 2

## 2020-03-01 MED ORDER — DEXAMETHASONE SODIUM PHOSPHATE 10 MG/ML IJ SOLN
INTRAMUSCULAR | Status: DC | PRN
  Administered 2020-03-01: 10 mg

## 2020-03-01 MED ORDER — EPINEPHRINE PF 1 MG/ML IJ SOLN
INTRAMUSCULAR | Status: AC
  Filled 2020-03-01: qty 1

## 2020-03-01 MED ORDER — FENTANYL CITRATE (PF) 100 MCG/2ML IJ SOLN: 25.0000 ug | INTRAMUSCULAR | Status: DC | PRN

## 2020-03-01 MED ORDER — TOBRAMYCIN-DEXAMETHASONE 0.3-0.1 % OP OINT
TOPICAL_OINTMENT | OPHTHALMIC | Status: AC
  Filled 2020-03-01: qty 3.5

## 2020-03-01 MED ORDER — ATROPINE SULFATE 1 % OP SOLN
OPHTHALMIC | Status: AC
  Filled 2020-03-01: qty 5

## 2020-03-01 MED ORDER — HYPROMELLOSE (GONIOSCOPIC) 2.5 % OP SOLN
OPHTHALMIC | Status: AC
  Filled 2020-03-01: qty 15

## 2020-03-01 MED ORDER — CHLORHEXIDINE GLUCONATE 0.12 % MT SOLN: 15.0000 mL | OROMUCOSAL | Status: AC

## 2020-03-01 MED ORDER — LIDOCAINE HCL 2 % IJ SOLN
INTRAMUSCULAR | Status: DC | PRN
  Administered 2020-03-01: 5 mL via RETROBULBAR

## 2020-03-01 MED ORDER — MIDAZOLAM HCL 2 MG/2ML IJ SOLN
INTRAMUSCULAR | Status: DC | PRN
  Administered 2020-03-01: 1 mg via INTRAVENOUS

## 2020-03-01 MED ORDER — PROPOFOL 10 MG/ML IV BOLUS
INTRAVENOUS | Status: DC | PRN
  Administered 2020-03-01: 20 mg via INTRAVENOUS
  Administered 2020-03-01: 10 mg via INTRAVENOUS

## 2020-03-01 MED ORDER — BSS PLUS IO SOLN
INTRAOCULAR | Status: AC
  Filled 2020-03-01: qty 500

## 2020-03-01 MED ORDER — LIDOCAINE HCL 2 % IJ SOLN
INTRAMUSCULAR | Status: AC
  Filled 2020-03-01: qty 20

## 2020-03-01 MED ORDER — CYCLOPENTOLATE HCL 1 % OP SOLN
1.0000 [drp] | OPHTHALMIC | Status: AC | PRN
  Administered 2020-03-01 (×3): 1 [drp] via OPHTHALMIC
  Filled 2020-03-01: qty 2

## 2020-03-01 MED ORDER — PHENYLEPHRINE HCL 2.5 % OP SOLN
1.0000 [drp] | OPHTHALMIC | Status: AC | PRN
  Administered 2020-03-01 (×3): 1 [drp] via OPHTHALMIC
  Filled 2020-03-01: qty 2

## 2020-03-01 MED ORDER — CEFAZOLIN SUBCONJUNCTIVAL INJECTION 100 MG/0.5 ML
100.0000 mg | INJECTION | SUBCONJUNCTIVAL | Status: AC
  Administered 2020-03-01: 100 mg via SUBCONJUNCTIVAL
  Filled 2020-03-01: qty 1

## 2020-03-01 MED ORDER — TRIAMCINOLONE ACETONIDE 40 MG/ML IJ SUSP
INTRAMUSCULAR | Status: AC
  Filled 2020-03-01: qty 5

## 2020-03-01 MED ORDER — INDOCYANINE GREEN 25 MG IV SOLR
INTRAVENOUS | Status: AC
  Filled 2020-03-01: qty 10

## 2020-03-01 MED ORDER — SODIUM CHLORIDE 0.9 % IV SOLN: INTRAVENOUS | Status: DC

## 2020-03-01 MED ORDER — EPINEPHRINE PF 1 MG/ML IJ SOLN
INTRAOCULAR | Status: DC | PRN
  Administered 2020-03-01: 500 mL

## 2020-03-01 MED ORDER — DEXAMETHASONE SODIUM PHOSPHATE 10 MG/ML IJ SOLN
INTRAMUSCULAR | Status: AC
  Filled 2020-03-01: qty 1

## 2020-03-01 MED ORDER — HYALURONIDASE HUMAN 150 UNIT/ML IJ SOLN
INTRAMUSCULAR | Status: AC
  Filled 2020-03-01: qty 1

## 2020-03-01 MED ORDER — BSS IO SOLN
INTRAOCULAR | Status: DC | PRN
  Administered 2020-03-01: 15 mL via INTRAOCULAR

## 2020-03-01 MED ORDER — ONDANSETRON HCL 4 MG/2ML IJ SOLN
INTRAMUSCULAR | Status: DC | PRN
  Administered 2020-03-01: 4 mg via INTRAVENOUS

## 2020-03-01 MED ORDER — BSS IO SOLN
INTRAOCULAR | Status: AC
  Filled 2020-03-01: qty 15

## 2020-03-01 MED ORDER — PROPOFOL 10 MG/ML IV BOLUS
INTRAVENOUS | Status: AC
  Filled 2020-03-01: qty 20

## 2020-03-01 MED ORDER — CHLORHEXIDINE GLUCONATE 0.12 % MT SOLN
OROMUCOSAL | Status: AC
  Administered 2020-03-01: 15 mL via OROMUCOSAL
  Filled 2020-03-01: qty 15

## 2020-03-01 MED ORDER — BUPIVACAINE HCL (PF) 0.75 % IJ SOLN
INTRAMUSCULAR | Status: AC
  Filled 2020-03-01: qty 10

## 2020-03-01 MED ORDER — OFLOXACIN 0.3 % OP SOLN
1.0000 [drp] | OPHTHALMIC | Status: AC | PRN
  Administered 2020-03-01 (×3): 1 [drp] via OPHTHALMIC
  Filled 2020-03-01: qty 5

## 2020-03-01 MED ORDER — HYPROMELLOSE (GONIOSCOPIC) 2.5 % OP SOLN
OPHTHALMIC | Status: DC | PRN
  Administered 2020-03-01: 1 [drp] via OPHTHALMIC

## 2020-03-01 MED ORDER — FENTANYL CITRATE (PF) 250 MCG/5ML IJ SOLN
INTRAMUSCULAR | Status: AC
  Filled 2020-03-01: qty 5

## 2020-03-01 SURGICAL SUPPLY — 48 items
APPLICATOR COTTON TIP 6 STRL (MISCELLANEOUS) ×1 IMPLANT
APPLICATOR COTTON TIP 6IN STRL (MISCELLANEOUS) ×3
BAND WRIST GAS GREEN (MISCELLANEOUS) IMPLANT
BNDG EYE OVAL (GAUZE/BANDAGES/DRESSINGS) ×3 IMPLANT
CANNULA VLV SOFT TIP 25GA (OPHTHALMIC) ×3 IMPLANT
CLOSURE STERI-STRIP 1/2X4 (GAUZE/BANDAGES/DRESSINGS) ×1
CLSR STERI-STRIP ANTIMIC 1/2X4 (GAUZE/BANDAGES/DRESSINGS) ×2 IMPLANT
COVER MAYO STAND STRL (DRAPES) ×3 IMPLANT
DRAPE HALF SHEET 40X57 (DRAPES) ×3 IMPLANT
DRAPE RETRACTOR (MISCELLANEOUS) ×3 IMPLANT
FORCEPS GRIESHABER ILM 25G A (INSTRUMENTS) ×3 IMPLANT
GAS AUTO FILL CONSTEL (OPHTHALMIC) ×3
GAS AUTO FILL CONSTELLATION (OPHTHALMIC) ×1 IMPLANT
GAS WRIST BAND GREEN (MISCELLANEOUS)
GLOVE SURG SYN 7.5  E (GLOVE) ×4
GLOVE SURG SYN 7.5 E (GLOVE) ×2 IMPLANT
GOWN STRL REUS W/ TWL LRG LVL3 (GOWN DISPOSABLE) ×1 IMPLANT
GOWN STRL REUS W/TWL LRG LVL3 (GOWN DISPOSABLE) ×2
KIT BASIN OR (CUSTOM PROCEDURE TRAY) ×3 IMPLANT
KIT TURNOVER KIT B (KITS) ×3 IMPLANT
LENS BIOM SUPER VIEW SET DISP (MISCELLANEOUS) ×3 IMPLANT
NEEDLE 18GX1X1/2 (RX/OR ONLY) (NEEDLE) ×3 IMPLANT
NEEDLE 25GX 5/8IN NON SAFETY (NEEDLE) ×3 IMPLANT
NEEDLE FILTER BLUNT 18X 1/2SAF (NEEDLE) ×2
NEEDLE FILTER BLUNT 18X1 1/2 (NEEDLE) ×1 IMPLANT
NEEDLE HYPO 25GX1X1/2 BEV (NEEDLE) IMPLANT
NEEDLE HYPO 30X.5 LL (NEEDLE) ×6 IMPLANT
NEEDLE RETROBULBAR 25GX1.5 (NEEDLE) ×3 IMPLANT
NS IRRIG 1000ML POUR BTL (IV SOLUTION) ×3 IMPLANT
OIL SILICONE OPHTHALMIC 1000 (Ophthalmic Related) ×3 IMPLANT
PACK VITRECTOMY CUSTOM (CUSTOM PROCEDURE TRAY) ×3 IMPLANT
PAD ARMBOARD 7.5X6 YLW CONV (MISCELLANEOUS) ×6 IMPLANT
PAK PIK VITRECTOMY CVS 25GA (OPHTHALMIC) ×3 IMPLANT
PIC ILLUMINATED 25G (OPHTHALMIC) ×3
PIK ILLUMINATED 25G (OPHTHALMIC) ×1 IMPLANT
PROBE ENDO DIATHERMY 25G (MISCELLANEOUS) ×3 IMPLANT
PROBE LASER ILLUM FLEX CVD 25G (OPHTHALMIC) ×3 IMPLANT
SET INJECTOR OIL FLUID CONSTEL (OPHTHALMIC) ×3 IMPLANT
SHIELD EYE LENSE ONLY DISP (GAUZE/BANDAGES/DRESSINGS) ×3 IMPLANT
SOL ANTI FOG 6CC (MISCELLANEOUS) ×1 IMPLANT
SOLUTION ANTI FOG 6CC (MISCELLANEOUS) ×2
SUT VICRYL 7 0 TG140 8 (SUTURE) IMPLANT
SUT VICRYL 8 0 TG140 8 (SUTURE) IMPLANT
SYR 10ML LL (SYRINGE) IMPLANT
SYR 20ML LL LF (SYRINGE) ×3 IMPLANT
SYR 5ML LL (SYRINGE) ×3 IMPLANT
SYR TB 1ML LUER SLIP (SYRINGE) ×9 IMPLANT
WATER STERILE IRR 1000ML POUR (IV SOLUTION) ×3 IMPLANT

## 2020-03-01 NOTE — H&P (Signed)
Date of examination:  03/01/20  Indication for surgery: tractional retinal detachment left eye  Pertinent past medical history:  Past Medical History:  Diagnosis Date  . 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 Cervical cancer ([place])   . Depression   . Essential hypertension   . History of cervical cancer   . Mixed hyperlipidemia   . Type 2 diabetes mellitus (HCC)     Pertinent ocular history:  Proliferative diabetic retinopathy left eye  Pertinent family history:  Family History  Problem Relation Age of Onset  . Cataracts Mother   . Glaucoma Mother   . Heart disease Mother   . Diabetes Father   . Heart attack Father   . Cataracts Sister   . Lung disease Sister   . Cataracts Brother   . Diabetes Brother   . Heart disease Brother   . Macular degeneration Maternal Aunt   . Diabetes Maternal Grandfather   . Heart disease Sister   . Diabetes Sister   . Throat cancer Son   . Breast cancer Niece   . Breast cancer Niece   . Amblyopia Neg Hx   . Blindness Neg Hx   . Retinal detachment Neg Hx   . Strabismus Neg Hx   . Retinitis pigmentosa Neg Hx     General:  Healthy appearing patient in no distress.   Eyes:    Acuity OS 20/80  External: Within normal limits      Anterior segment: Within normal limits        Fundus: superior tractional retinal detachment with gliosis    Impression: Tractional retinal detachment left eye Plan:   Tractional retinal detachment repair left eye  Jalene Mullet, MD

## 2020-03-01 NOTE — Anesthesia Procedure Notes (Signed)
Procedure Name: MAC Date/Time: 03/01/2020 2:30 PM Performed by: Darletta Moll, CRNA Pre-anesthesia Checklist: Patient identified, Emergency Drugs available, Suction available and Patient being monitored Patient Re-evaluated:Patient Re-evaluated prior to induction Oxygen Delivery Method: Nasal cannula

## 2020-03-01 NOTE — Anesthesia Postprocedure Evaluation (Signed)
Anesthesia Post Note  Patient: Jacqueline Bird  Procedure(s) Performed: REPAIR OF COMPLEX TRACTION RETINAL DETACHMENT (Left Eye) PARS PLANA VITRECTOMY WITH 25 GAUGE (Left Eye) PHOTOCOAGULATION WITH LASER (Left Eye) INJECTION OF SILICONE OIL (Left Eye) AIR/FLUID EXCHANGE (Left Eye)     Patient location during evaluation: PACU Anesthesia Type: MAC Level of consciousness: awake Pain management: pain level controlled Vital Signs Assessment: post-procedure vital signs reviewed and stable Cardiovascular status: stable Anesthetic complications: no   No complications documented.  Last Vitals:  Vitals:   03/01/20 1625 03/01/20 1640  BP: 129/64 (!) 132/52  Pulse: 83 85  Resp: 14 17  Temp: 37.2 C   SpO2: 98% 98%    Last Pain:  Vitals:   03/01/20 1640  TempSrc:   PainSc: 0-No pain                 Calypso Hagarty

## 2020-03-01 NOTE — Transfer of Care (Signed)
Immediate Anesthesia Transfer of Care Note  Patient: Jacqueline Bird  Procedure(s) Performed: REPAIR OF COMPLEX TRACTION RETINAL DETACHMENT (Left Eye) PARS PLANA VITRECTOMY WITH 25 GAUGE (Left Eye) PHOTOCOAGULATION WITH LASER (Left Eye) INJECTION OF SILICONE OIL (Left Eye) AIR/FLUID EXCHANGE (Left Eye)  Patient Location: PACU  Anesthesia Type:General  Level of Consciousness: drowsy and patient cooperative  Airway & Oxygen Therapy: Patient Spontanous Breathing  Post-op Assessment: Report given to RN, Post -op Vital signs reviewed and stable and Patient moving all extremities X 4  Post vital signs: Reviewed and stable  Last Vitals:  Vitals Value Taken Time  BP 129/64 03/01/20 1623  Temp    Pulse 83 03/01/20 1624  Resp 14 03/01/20 1624  SpO2 98 % 03/01/20 1624  Vitals shown include unvalidated device data.  Last Pain:  Vitals:   03/01/20 1222  TempSrc:   PainSc: 0-No pain      Patients Stated Pain Goal: 0 (30/07/62 2633)  Complications: No complications documented.

## 2020-03-01 NOTE — Discharge Instructions (Signed)
Can take eye patch off at 9 pm 03/01/2020.  Dr. Posey Pronto will call you the morning of 03/02/2020 to schedule you for tomorrow evening.  You will probably have blurred vision from that eye for a few days.  Sleep sitting up, do not lie flat! Call for drainage, fever, uncontrolled pain or any other concerning symptoms.  [phone].

## 2020-03-01 NOTE — Brief Op Note (Signed)
03/01/2020  5:22 PM  PATIENT:  Jacqueline Bird  69 y.o. 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  PROCEDURE:  Procedure(s): REPAIR OF COMPLEX TRACTION RETINAL DETACHMENT (Left) PARS PLANA VITRECTOMY WITH 25 GAUGE (Left) PHOTOCOAGULATION WITH LASER (Left) INJECTION OF SILICONE OIL (Left) AIR/FLUID EXCHANGE (Left) Membranectomy (Left) Drainage of subretinal fluid (Left) Endocautery (Left)  SURGEON:  Surgeon(s) and Role:    * Jalene Mullet, MD - Primary  PHYSICIAN ASSISTANT:   ASSISTANTS: none   ANESTHESIA:   local and MAC  EBL:  minimal   BLOOD ADMINISTERED:none  DRAINS: none   LOCAL MEDICATIONS USED:  MARCAINE    and LIDOCAINE   SPECIMEN:  No Specimen  DISPOSITION OF SPECIMEN:  N/A  COUNTS:  YES  TOURNIQUET:  * No tourniquets in log *  DICTATION: . Note written in EPIC  PLAN OF CARE: Discharge to home after PACU  PATIENT DISPOSITION:  PACU - hemodynamically stable.   Delay start of Pharmacological VTE agent (>24hrs) due to surgical blood loss or risk of bleeding: not applicable

## 2020-03-04 ENCOUNTER — Encounter (HOSPITAL_COMMUNITY): Payer: Self-pay | Admitting: Ophthalmology

## 2020-03-05 ENCOUNTER — Ambulatory Visit (HOSPITAL_COMMUNITY): Admission: RE | Admit: 2020-03-05 | Payer: Medicare HMO | Source: Ambulatory Visit

## 2020-03-05 ENCOUNTER — Other Ambulatory Visit (HOSPITAL_COMMUNITY): Payer: Medicare HMO

## 2020-03-11 ENCOUNTER — Ambulatory Visit (HOSPITAL_COMMUNITY): Payer: Medicare HMO | Admitting: Hematology

## 2020-03-11 HISTORY — PX: RETINAL DETACHMENT SURGERY: SHX105

## 2020-03-19 ENCOUNTER — Ambulatory Visit: Payer: Medicare HMO | Admitting: Cardiology

## 2020-03-21 ENCOUNTER — Other Ambulatory Visit: Payer: Self-pay

## 2020-03-21 ENCOUNTER — Other Ambulatory Visit (HOSPITAL_COMMUNITY): Payer: Self-pay | Admitting: Hematology

## 2020-03-21 ENCOUNTER — Inpatient Hospital Stay (HOSPITAL_COMMUNITY): Payer: Medicare HMO | Attending: Hematology

## 2020-03-21 ENCOUNTER — Ambulatory Visit (HOSPITAL_COMMUNITY)
Admission: RE | Admit: 2020-03-21 | Discharge: 2020-03-21 | Disposition: A | Discharge status: Home / Self Care | Payer: Medicare HMO | Source: Ambulatory Visit | Attending: Hematology | Admitting: Hematology

## 2020-03-21 DIAGNOSIS — F329 Major depressive disorder, single episode, unspecified: Secondary | ICD-10-CM | POA: Diagnosis not present

## 2020-03-21 DIAGNOSIS — Z923 Personal history of irradiation: Secondary | ICD-10-CM | POA: Diagnosis not present

## 2020-03-21 DIAGNOSIS — E119 Type 2 diabetes mellitus without complications: Secondary | ICD-10-CM | POA: Diagnosis not present

## 2020-03-21 DIAGNOSIS — M8580 Other specified disorders of bone density and structure, unspecified site: Secondary | ICD-10-CM | POA: Diagnosis not present

## 2020-03-21 DIAGNOSIS — Z79899 Other long term (current) drug therapy: Secondary | ICD-10-CM | POA: Insufficient documentation

## 2020-03-21 DIAGNOSIS — Z23 Encounter for immunization: Secondary | ICD-10-CM | POA: Insufficient documentation

## 2020-03-21 DIAGNOSIS — C210 Malignant neoplasm of anus, unspecified: Secondary | ICD-10-CM | POA: Diagnosis not present

## 2020-03-21 DIAGNOSIS — K7460 Unspecified cirrhosis of liver: Secondary | ICD-10-CM | POA: Diagnosis not present

## 2020-03-21 DIAGNOSIS — E782 Mixed hyperlipidemia: Secondary | ICD-10-CM | POA: Insufficient documentation

## 2020-03-21 DIAGNOSIS — Z9221 Personal history of antineoplastic chemotherapy: Secondary | ICD-10-CM | POA: Insufficient documentation

## 2020-03-21 DIAGNOSIS — Z8541 Personal history of malignant neoplasm of cervix uteri: Secondary | ICD-10-CM | POA: Diagnosis not present

## 2020-03-21 DIAGNOSIS — I700 Atherosclerosis of aorta: Secondary | ICD-10-CM | POA: Diagnosis not present

## 2020-03-21 DIAGNOSIS — Z85048 Personal history of other malignant neoplasm of rectum, rectosigmoid junction, and anus: Secondary | ICD-10-CM | POA: Diagnosis present

## 2020-03-21 LAB — CBC WITH DIFFERENTIAL/PLATELET
Abs Immature Granulocytes: 0.03 10*3/uL (ref 0.00–0.07)
Basophils Absolute: 0 10*3/uL (ref 0.0–0.1)
Basophils Relative: 1 %
Eosinophils Absolute: 0.1 10*3/uL (ref 0.0–0.5)
Eosinophils Relative: 1 %
HCT: 42.9 % (ref 36.0–46.0)
Hemoglobin: 14.2 g/dL (ref 12.0–15.0)
Immature Granulocytes: 0 %
Lymphocytes Relative: 15 %
Lymphs Abs: 1.1 10*3/uL (ref 0.7–4.0)
MCH: 31.1 pg (ref 26.0–34.0)
MCHC: 33.1 g/dL (ref 30.0–36.0)
MCV: 93.9 fL (ref 80.0–100.0)
Monocytes Absolute: 0.6 10*3/uL (ref 0.1–1.0)
Monocytes Relative: 8 %
Neutro Abs: 5.6 10*3/uL (ref 1.7–7.7)
Neutrophils Relative %: 75 %
Platelets: 209 10*3/uL (ref 150–400)
RBC: 4.57 MIL/uL (ref 3.87–5.11)
RDW: 12.4 % (ref 11.5–15.5)
WBC: 7.5 10*3/uL (ref 4.0–10.5)
nRBC: 0 % (ref 0.0–0.2)

## 2020-03-21 LAB — COMPREHENSIVE METABOLIC PANEL
ALT: 21 U/L (ref 0–44)
AST: 17 U/L (ref 15–41)
Albumin: 3.6 g/dL (ref 3.5–5.0)
Alkaline Phosphatase: 89 U/L (ref 38–126)
Anion gap: 6 (ref 5–15)
BUN: 21 mg/dL (ref 8–23)
CO2: 28 mmol/L (ref 22–32)
Calcium: 9.1 mg/dL (ref 8.9–10.3)
Chloride: 99 mmol/L (ref 98–111)
Creatinine, Ser: 0.63 mg/dL (ref 0.44–1.00)
GFR, Estimated: >60 mL/min (ref >60)
Glucose, Bld: 160 mg/dL — ABNORMAL HIGH (ref 70–99)
Potassium: 3.8 mmol/L (ref 3.5–5.1)
Sodium: 133 mmol/L — ABNORMAL LOW (ref 135–145)
Total Bilirubin: 0.6 mg/dL (ref 0.3–1.2)
Total Protein: 7.1 g/dL (ref 6.5–8.1)

## 2020-03-21 MED ORDER — IOHEXOL 300 MG/ML  SOLN
100.0000 mL | Freq: Once | INTRAMUSCULAR | Status: AC | PRN
  Administered 2020-03-21: 100 mL via INTRAVENOUS

## 2020-03-25 ENCOUNTER — Inpatient Hospital Stay (HOSPITAL_BASED_OUTPATIENT_CLINIC_OR_DEPARTMENT_OTHER): Payer: Medicare HMO | Admitting: Hematology

## 2020-03-25 ENCOUNTER — Other Ambulatory Visit: Payer: Self-pay

## 2020-03-25 VITALS — BP 118/56 | HR 89 | Temp 97.2°F | Resp 18 | Wt 158.4 lb

## 2020-03-25 DIAGNOSIS — Z85048 Personal history of other malignant neoplasm of rectum, rectosigmoid junction, and anus: Secondary | ICD-10-CM | POA: Diagnosis not present

## 2020-03-25 DIAGNOSIS — C210 Malignant neoplasm of anus, unspecified: Secondary | ICD-10-CM

## 2020-03-25 DIAGNOSIS — Z23 Encounter for immunization: Secondary | ICD-10-CM

## 2020-03-25 MED ORDER — INFLUENZA VAC A&B SA ADJ QUAD 0.5 ML IM PRSY
0.5000 mL | PREFILLED_SYRINGE | Freq: Once | INTRAMUSCULAR | Status: AC
  Administered 2020-03-25: 0.5 mL via INTRAMUSCULAR
  Filled 2020-03-25: qty 0.5

## 2020-03-25 MED ORDER — ALPRAZOLAM 0.5 MG PO TABS
ORAL_TABLET | ORAL | 5 refills | Status: DC
Start: 2020-03-25 — End: 2022-06-23

## 2020-03-25 NOTE — Patient Instructions (Signed)
[place] at [facility] Discharge Instructions  You were seen today by Dr. Delton Coombes. He went over your recent results and scans. You will be scheduled for a CT scan of your abdomen before your next visit. Dr. Delton Coombes will see you back in 6 months for labs and follow up.   Thank you for choosing [place] at [facility] to provide your oncology and hematology care.  To afford each patient quality time with our provider, please arrive at least 15 minutes before your scheduled appointment time.   If you have a lab appointment with the [facility] please come in thru the Main Entrance and check in at the main information desk  You need to re-schedule your appointment should you arrive 10 or more minutes late.  We strive to give you quality time with our providers, and arriving late affects you and other patients whose appointments are after yours.  Also, if you no show three or more times for appointments you may be dismissed from the clinic at the providers discretion.     Again, thank you for choosing [facility].  Our hope is that these requests will decrease the amount of time that you wait before being seen by our physicians.       _____________________________________________________________  Should you have questions after your visit to [facility], please contact our office at [phone] between the hours of 8:00 a.m. and 4:30 p.m.  Voicemails left after 4:00 p.m. will not be returned until the following business day.  For prescription refill requests, have your pharmacy contact our office and allow 72 hours.    Cancer Center Support Programs:   > Cancer Support Group  2nd Tuesday of the month 1pm-2pm, Journey Room

## 2020-03-25 NOTE — Progress Notes (Signed)
Jacqueline Bird presents today for injection per the provider's orders.  High Dose Flu administration without incident; injection site WNL; see MAR for injection details.  Patient tolerated procedure well and without incident.  No questions or complaints noted at this time.

## 2020-03-25 NOTE — Progress Notes (Signed)
[place], [place] [address]   CLINIC:  Medical Oncology/Hematology  PCP:  Celene Squibb, MD [address] [place] [address] [phone]   REASON FOR VISIT:  Follow-up for anal cancer  PRIOR THERAPY: 5-FU & mitomycin from 03/03/2019 to 03/31/2019  NGS Results: Not done  CURRENT THERAPY: Observation  BRIEF ONCOLOGIC HISTORY:  Oncology History  Anal cancer ([place])  02/01/2019 Initial Diagnosis   Anal cancer ([place])   02/16/2019 Cancer Staging   Staging form: Anus, AJCC 8th Edition - Clinical: Stage IIIC (cT3, cN1c, cM0) - Signed by Kyung Rudd, MD on 02/16/2019   03/03/2019 -  Chemotherapy   The patient had mitoMYcin (MUTAMYCIN) chemo injection 20 mg, 9.7 mg/m2 = 21 mg, Intravenous,  Once, 1 of 1 cycle Dose modification: 8 mg/m2 (original dose 10 mg/m2, Cycle 1, Reason: Provider Judgment) Administration: 20 mg (03/03/2019), 16.5 mg (03/31/2019) fluorouracil (ADRUCIL) 8,300 mg in sodium chloride 0.9 % 84 mL chemo infusion, 1,000 mg/m2/day = 8,300 mg, Intravenous, 4D (96 hours ), 1 of 1 cycle Administration: 8,300 mg (03/03/2019), 8,300 mg (03/31/2019)  for chemotherapy treatment.      CANCER STAGING: Cancer Staging Anal cancer [facility]) Staging form: Anus, AJCC 8th Edition - Clinical: Stage IIIC (cT3, cN1c, cM0) - Signed by Kyung Rudd, MD on 02/16/2019   INTERVAL HISTORY:  [name], a 69 y.o. female, returns for routine follow-up of her anal cancer. Jacqueline Bird was last seen on 10/25/2019.   Today she reports feeling fair. She had left retinal detachment surgery on 10/15 and her vision is stable. She is currently on Novolog, [place] for her diabetes. She denies having any pain in her rectal area.  She received her flu shot today.   REVIEW OF SYSTEMS:  Review of Systems  Constitutional: Positive for fatigue (50%). Negative for appetite change.  Gastrointestinal: Negative for rectal pain.  Neurological:  Positive for headaches.  Psychiatric/Behavioral: Positive for depression and sleep disturbance. The patient is nervous/anxious.   All other systems reviewed and are negative.   PAST MEDICAL/SURGICAL HISTORY:  Past Medical History:  Diagnosis Date  . 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 Cervical cancer ([place])   . Depression   . Essential hypertension   . History of cervical cancer   . Mixed hyperlipidemia   . Type 2 diabetes mellitus ([place])    Past Surgical History:  Procedure Laterality Date  . AIR/FLUID EXCHANGE Left 03/01/2020   Procedure: AIR/FLUID EXCHANGE;  Surgeon: Jalene Mullet, MD;  Location: [facility];  Service: Ophthalmology;  Laterality: Left;  . CATARACT EXTRACTION, BILATERAL Right 05/2017  . CERVICAL CONE BIOPSY    . HAND AMPUTATION Left   . INJECTION OF SILICONE OIL Left 22/97/9892   Procedure: INJECTION OF SILICONE OIL;  Surgeon: Jalene Mullet, MD;  Location: [facility];  Service: Ophthalmology;  Laterality: Left;  . PARS PLANA VITRECTOMY Left 03/01/2020   Procedure: PARS PLANA VITRECTOMY WITH 25 GAUGE;  Surgeon: Jalene Mullet, MD;  Location: [facility];  Service: Ophthalmology;  Laterality: Left;  . PHOTOCOAGULATION WITH LASER Left 03/01/2020   Procedure: PHOTOCOAGULATION WITH LASER;  Surgeon: Jalene Mullet, MD;  Location: [facility];  Service: Ophthalmology;  Laterality: Left;  . PORTACATH PLACEMENT Left 02/08/2019   Procedure: INSERTION PORT-A-CATH;  Surgeon: Virl Cagey, MD;  Location: [facility];  Service: General;  Laterality:  Left;  . RECTAL BIOPSY  01/26/2019   Procedure: BIOPSY OF ANAL MASS;  Surgeon: Virl Cagey, MD;  Location: [facility];  Service: General;;  . RECTAL BIOPSY N/A 11/13/2019   Procedure: BIOPSY ANAL AREA;  Surgeon: Virl Cagey, MD;  Location: [facility];  Service: General;  Laterality: N/A;  . RECTAL EXAM UNDER ANESTHESIA N/A 01/26/2019   Procedure: RECTAL EXAM UNDER  ANESTHESIA;  Surgeon: Virl Cagey, MD;  Location: [facility];  Service: General;  Laterality: N/A;  . RECTAL EXAM UNDER ANESTHESIA N/A 11/13/2019   Procedure: RECTAL EXAM UNDER ANESTHESIA  WITH ANAL BIOPSY;  Surgeon: Virl Cagey, MD;  Location: [facility];  Service: General;  Laterality: N/A;  . 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 03/01/2020   Procedure: REPAIR OF COMPLEX TRACTION RETINAL DETACHMENT;  Surgeon: Jalene Mullet, MD;  Location: [facility];  Service: Ophthalmology;  Laterality: Left;    SOCIAL HISTORY:  Social History   Socioeconomic History  . Marital status: Single    Spouse name: Not on file  . Number of children: 2  . Years of education: Not on file  . Highest education level: Not on file  Occupational History  . Occupation: retired  Tobacco Use  . Smoking status: Never Smoker  . Smokeless tobacco: Never Used  Vaping Use  . Vaping Use: Never used  Substance and Sexual Activity  . Alcohol use: Not Currently  . Drug use: Never  . Sexual activity: Not on file  Other Topics Concern  . Not on file  Social History Narrative   Pt is retired and currently engaged.   Social Determinants of Health   Financial Resource Strain:   . Difficulty of Paying Living Expenses: Not on file  Food Insecurity:   . Worried About Charity fundraiser in the Last Year: Not on file  . Ran Out of Food in the Last Year: Not on file  Transportation Needs:   . Lack of Transportation (Medical): Not on file  . Lack of Transportation (Non-Medical): Not on file  Physical Activity:   . Days of Exercise per Week: Not on file  . Minutes of Exercise per Session: Not on file  Stress:   . Feeling of Stress : Not on file  Social Connections:   . Frequency of Communication with Friends and Family: Not on file  . Frequency of Social Gatherings with Friends and Family: Not on file  . Attends Religious Services: Not on file  . Active Member of Clubs or Organizations: Not on file    . Attends Archivist Meetings: Not on file  . Marital Status: Not on file  Intimate Partner Violence:   . Fear of Current or Ex-Partner: Not on file  . Emotionally Abused: Not on file  . Physically Abused: Not on file  . Sexually Abused: Not on file    FAMILY HISTORY:  Family History  Problem Relation Age of Onset  . Cataracts Mother   . Glaucoma Mother   . Heart disease Mother   . Diabetes Father   . Heart attack Father   . Cataracts Sister   . Lung disease Sister   . Cataracts Brother   . Diabetes Brother   . Heart disease Brother   . Macular degeneration Maternal Aunt   . Diabetes Maternal Grandfather   . Heart disease Sister   . Diabetes Sister   . Throat cancer Son   . Breast cancer Niece   . Breast  cancer Niece   . Amblyopia Neg Hx   . Blindness Neg Hx   . Retinal detachment Neg Hx   . Strabismus Neg Hx   . Retinitis pigmentosa Neg Hx     CURRENT MEDICATIONS:  Current Outpatient Medications  Medication Sig Dispense Refill  . acetaminophen (TYLENOL) 500 MG tablet Take 500-1,000 mg by mouth every 6 (six) hours as needed (for pain.).    Marland Kitchen ALPRAZolam (XANAX) 0.5 MG tablet TAKE 1 TABLET BY MOUTH AT BEDTIME AS NEEDED FOR ANXIETY / SLEEP 30 tablet 5  . atorvastatin (LIPITOR) 80 MG tablet Take 80 mg by mouth every evening.     . Cholecalciferol (VITAMIN D) 125 MCG (5000 UT) CAPS Take 10,000 Units by mouth daily.     Marland Kitchen FARXIGA 10 MG TABS tablet Take 10 mg by mouth daily.    Marland Kitchen gabapentin (NEURONTIN) 100 MG capsule Take 100-200 mg by mouth See admin instructions. Take 2 capsules (200 mg) by mouth in the morning and take 1 capsule (100mg ) at night.    . Melatonin 10 MG TABS Take 10 mg by mouth at bedtime as needed (sleep).    . NOVOLOG FLEXPEN 100 UNIT/ML FlexPen Inject 4-6 Units into the skin in the morning, at noon, and at bedtime.     Marland Kitchen ofloxacin (OCUFLOX) 0.3 % ophthalmic solution     . oxyCODONE 10 MG TABS Take 0.5 tablets (5 mg total) by mouth every 4  (four) hours as needed for severe pain or breakthrough pain. 10 tablet 0  . prednisoLONE acetate (PRED FORTE) 1 % ophthalmic suspension SMARTSIG:In Eye(s)    . sertraline (ZOLOFT) 50 MG tablet Take 50 mg by mouth daily.     . traZODone (DESYREL) 50 MG tablet Take 50 mg by mouth at bedtime.    Tyler Aas FLEXTOUCH 200 UNIT/ML SOPN Inject 20 Units into the skin daily before breakfast.    . TRULICITY 8.52 DP/8.2UM SOPN Inject 0.75 mg into the muscle every Sunday.     . vitamin B-12 (CYANOCOBALAMIN) 1000 MCG tablet Take 2,000 mcg by mouth daily.     No current facility-administered medications for this visit.   Facility-Administered Medications Ordered in Other Visits  Medication Dose Route Frequency Provider Last Rate Last Admin  . heparin lock flush 100 unit/mL  500 Units Intracatheter Once PRN Derek Jack, MD      . sodium chloride flush (NS) 0.9 % injection 10 mL  10 mL Intracatheter PRN Derek Jack, MD   10 mL at 03/03/19 1152    ALLERGIES:  Allergies  Allergen Reactions  . Sulfa Antibiotics Anaphylaxis and Swelling    Per pt, can use creams with sulfa in it    PHYSICAL EXAM:  Performance status (ECOG): 1 - Symptomatic but completely ambulatory  Vitals:   03/25/20 1516  BP: (!) 118/56  Pulse: 89  Resp: 18  Temp: (!) 97.2 F (36.2 C)  SpO2: 100%   Wt Readings from Last 3 Encounters:  03/25/20 158 lb 6.4 oz (71.8 kg)  03/01/20 158 lb (71.7 kg)  10/31/19 175 lb (79.4 kg)   Physical Exam Vitals reviewed.  Constitutional:      Appearance: Normal appearance.  Cardiovascular:     Rate and Rhythm: Normal rate and regular rhythm.     Pulses: Normal pulses.     Heart sounds: Normal heart sounds.  Pulmonary:     Effort: Pulmonary effort is normal.     Breath sounds: Normal breath sounds.  Musculoskeletal:  Comments: Absent L hand  Neurological:     General: No focal deficit present.     Mental Status: She is alert and oriented to person, place, and  time.  Psychiatric:        Mood and Affect: Mood normal.        Behavior: Behavior normal.      LABORATORY DATA:  I have reviewed the labs as listed.  CBC Latest Ref Rng & Units 03/21/2020 03/01/2020 10/23/2019  WBC 4.0 - 10.5 K/uL 7.5 5.5 9.9  Hemoglobin 12.0 - 15.0 g/dL 14.2 14.1 13.3  Hematocrit 36 - 46 % 42.9 43.7 41.7  Platelets 150 - 400 K/uL 209 212 334   CMP Latest Ref Rng & Units 03/21/2020 03/01/2020 10/23/2019  Glucose 70 - 99 mg/dL 160(H) 133(H) 151(H)  BUN 8 - 23 mg/dL 21 14 26(H)  Creatinine 0.44 - 1.00 mg/dL 0.63 0.70 0.74  Sodium 135 - 145 mmol/L 133(L) 138 137  Potassium 3.5 - 5.1 mmol/L 3.8 3.9 3.6  Chloride 98 - 111 mmol/L 99 103 99  CO2 22 - 32 mmol/L 28 24 28   Calcium 8.9 - 10.3 mg/dL 9.1 9.3 9.2  Total Protein 6.5 - 8.1 g/dL 7.1 - 7.0  Total Bilirubin 0.3 - 1.2 mg/dL 0.6 - 0.3  Alkaline Phos 38 - 126 U/L 89 - 68  AST 15 - 41 U/L 17 - 17  ALT 0 - 44 U/L 21 - 16    DIAGNOSTIC IMAGING:  I have independently reviewed the scans and discussed with the patient. CT ABDOMEN PELVIS W CONTRAST  Result Date: 03/22/2020 CLINICAL DATA:  Status post chemotherapy and radiation therapy for anal cancer. Diabetes. Hypertension. Cervical cancer. EXAM: CT ABDOMEN AND PELVIS WITH CONTRAST TECHNIQUE: Multidetector CT imaging of the abdomen and pelvis was performed using the standard protocol following bolus administration of intravenous contrast. CONTRAST:  143mL OMNIPAQUE IOHEXOL 300 MG/ML  SOLN COMPARISON:  10/23/2019 PET FINDINGS: Lower chest: Clear lung bases. Normal heart size without pericardial or pleural effusion. Hepatobiliary: Mild cirrhosis, as evidenced by caudate enlargement, medial segment left liver lobe atrophy and a subtle irregular hepatic capsule. No focal liver lesion. Normal gallbladder, without biliary ductal dilatation. Pancreas: Normal, without mass or ductal dilatation. Spleen: Normal in size, without focal abnormality. Adrenals/Urinary Tract: Normal adrenal  glands. Normal kidneys, without hydronephrosis. Normal urinary bladder. Stomach/Bowel: Normal stomach, without wall thickening. No recurrent well-defined anal rectal primary. Suggestion of mild low rectal wall thickening including on 81/2, likely treatment related. Colonic stool burden suggests constipation. Normal terminal ileum and appendix. Normal small bowel. Vascular/Lymphatic: Aortic atherosclerosis. Patent splenic, portal, hepatic veins. No evidence of portal venous hypertension. No abdominopelvic adenopathy. Reproductive: Normal uterus and adnexa. Other: No significant free fluid. No evidence of omental or peritoneal disease. Mild pelvic floor laxity. Musculoskeletal: Osteopenia. Posterior left eleventh rib non healed fracture. IMPRESSION: 1. No acute process or evidence of metastatic disease in the abdomen or pelvis. 2. Low rectal wall thickening is mild, most likely treatment related. No well-defined residual or recurrent mass. 3. Mild cirrhosis. 4.  Possible constipation. 5.  Aortic Atherosclerosis (ICD10-I70.0). Electronically Signed   By: Abigail Miyamoto M.D.   On: 03/22/2020 09:11     ASSESSMENT:  1. 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 -PET scan on 10/23/2019 showed no specific worrisome recurrent hypermetabolic anal activity. Maximum SUV in the anal region was 4.1, previously 4.3. No recurrent pathologically enlarged or hypermetabolic inguinal or  external iliac lymph nodes. No metastatic spread. -Biopsy of the anal ulcer consistent with benign inflamed squamous mucosa with ulceration. -CTAP on 03/21/2020 with no acute process or evidence of metastatic disease in the abdomen or pelvis.  Lower rectal wall thickening is mild, most likely treatment related.  Mild cirrhosis.   PLAN:  1. 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 Constance Haw which was  benign. -CTAP with contrast on 03/21/2020 did not show any evidence of recurrence. -It showed mild cirrhosis.  We have opted to follow-up on the subsequent scan. -RTC 6 months with scan.  2. Perianal pain: -This has completely resolved.  3. Difficulty falling asleep: -She apparently lost her Xanax.  I will send another refill today.  4. Diabetes: -Continue Tresiba, Trulicity and NovoLog.   Orders placed this encounter:  No orders of the defined types were placed in this encounter.    Derek Jack, MD [facility] [phone]   I, Milinda Antis, am acting as a scribe for Dr. Sanda Linger.  I, Derek Jack MD, have reviewed the above documentation for accuracy and completeness, and I agree with the above.

## 2020-04-01 ENCOUNTER — Inpatient Hospital Stay (HOSPITAL_COMMUNITY): Payer: Medicare HMO

## 2020-04-04 NOTE — Op Note (Signed)
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 Retrobulbar anesthesia was obtained in the left eye without complication. The left eye was prepped and draped in the usual sterile fashion for intraocular surgery.  Using the Alcon 25-gauge trocar system, an infusion line and trocar were placed at the 4 o'clock position followed by active trocars both at 10 and 2 o'clock. A light pipe and vitrector were inserted and a core vitrectomy was performed. Attention was directed to the posterior pole. Careful segmentation and delamination was performed and the posterior hyaloid carefully dissected free from the peripheral vitreous. Attention was directed toward relieving the tractional detachment from the posterior pole in particular around the arcades and mid periphery. This was done carefully due to the degree of gliosis/fibrosis and extensive neovascularization. There was notable gliosis at the disc and tracking towards the arcades as well as a significant area of gliosis and traction nasally. Care was taken to elevate the membranes and remove them both with the ILM forceps, vitrector and a light pipe with dissection. Following removal of the membranes and use of the membrane forceps to elevate the membrane and remove the membrane, attention was directed toward endo-cautery to access the subretinal bands. ILM forceps were used to remove the subretinal bands superior to the macula, inferior periphery and nasally.. Following hemostasis, continued  dissection of membranes and removal of membranes was performed over and around the arcades as well as the nasal retina. Following careful segmentation, delamination and removal of membranes, a complete air/fluid exchange was performed and endolaser was applied to the areas where the neovascular fronds were present and surrounding the breaks from endocuatery. Additional endolaser was applied to the ora 360 degrees. After additional air/fluid exchange, 5056 centistoke silicone oil was injected into the eye.  The trocars were sequentially removed and the superior sclerotomies and overlying conjunctiva were closed with 8-0 Vicryl. Subconjunctival injections of Ancef and Decadron were placed.  The speculum and drapes were removed and the eye was patched with Polymixin/Bacitracin ophthalmic ointment. An eye shield was placed and the patient was transferred alert and conversant with stable vital signs to the post operative recovery area.  The patient tolerated the procedure well and no complications were noted.  Royston Cowper MD

## 2020-04-26 ENCOUNTER — Ambulatory Visit: Payer: Medicare HMO | Admitting: Cardiology

## 2020-04-26 NOTE — Progress Notes (Unsigned)
Cardiology Office Note  Date: 04/26/2020   ID: Jacqueline Bird, DOB [birth year], MRN [id number]  PCP:  Celene Squibb, MD  Cardiologist:  Rozann Lesches, MD Electrophysiologist:  None   No chief complaint on file.   History of Present Illness: Jacqueline Bird is a 69 y.o. female last seen in May.  Recent lab work is noted below.  Past Medical History:  Diagnosis Date  . 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 Cervical cancer ([place])   . Depression   . Essential hypertension   . History of cervical cancer   . Mixed hyperlipidemia   . Type 2 diabetes mellitus ([place])     Past Surgical History:  Procedure Laterality Date  . AIR/FLUID EXCHANGE Left 03/01/2020   Procedure: AIR/FLUID EXCHANGE;  Surgeon: Jalene Mullet, MD;  Location: [facility];  Service: Ophthalmology;  Laterality: Left;  . CATARACT EXTRACTION, BILATERAL Right 05/2017  . CERVICAL CONE BIOPSY    . HAND AMPUTATION Left   . INJECTION OF SILICONE OIL Left 56/38/7564   Procedure: INJECTION OF SILICONE OIL;  Surgeon: Jalene Mullet, MD;  Location: [facility];  Service: Ophthalmology;  Laterality: Left;  . PARS PLANA VITRECTOMY Left 03/01/2020   Procedure: PARS PLANA VITRECTOMY WITH 25 GAUGE;  Surgeon: Jalene Mullet, MD;  Location: [facility];  Service: Ophthalmology;  Laterality: Left;  . PHOTOCOAGULATION WITH LASER Left 03/01/2020   Procedure: PHOTOCOAGULATION WITH LASER;  Surgeon: Jalene Mullet, MD;  Location: [facility];  Service: Ophthalmology;  Laterality: Left;  . PORTACATH PLACEMENT Left 02/08/2019   Procedure: INSERTION PORT-A-CATH;  Surgeon: Virl Cagey, MD;  Location: [facility];  Service: General;  Laterality: Left;  . RECTAL BIOPSY  01/26/2019   Procedure: BIOPSY OF ANAL MASS;  Surgeon: Virl Cagey, MD;  Location: [facility];  Service: General;;  . RECTAL BIOPSY N/A 11/13/2019   Procedure: BIOPSY ANAL AREA;  Surgeon: Virl Cagey, MD;   Location: [facility];  Service: General;  Laterality: N/A;  . RECTAL EXAM UNDER ANESTHESIA N/A 01/26/2019   Procedure: RECTAL EXAM UNDER ANESTHESIA;  Surgeon: Virl Cagey, MD;  Location: [facility];  Service: General;  Laterality: N/A;  . RECTAL EXAM UNDER ANESTHESIA N/A 11/13/2019   Procedure: RECTAL EXAM UNDER ANESTHESIA  WITH ANAL BIOPSY;  Surgeon: Virl Cagey, MD;  Location: [facility];  Service: General;  Laterality: N/A;  . 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 03/01/2020   Procedure: REPAIR OF COMPLEX TRACTION RETINAL DETACHMENT;  Surgeon: Jalene Mullet, MD;  Location: [facility];  Service: Ophthalmology;  Laterality: Left;    Current Outpatient Medications  Medication Sig Dispense Refill  . acetaminophen (TYLENOL) 500 MG tablet Take 500-1,000 mg by mouth every 6 (six) hours as needed (for pain.).    Marland Kitchen ALPRAZolam (XANAX) 0.5 MG tablet TAKE 1 TABLET BY MOUTH AT BEDTIME AS NEEDED FOR ANXIETY / SLEEP 30 tablet 5  . atorvastatin (LIPITOR) 80 MG tablet Take 80 mg by mouth every evening.     . Cholecalciferol (VITAMIN D) 125 MCG (5000 UT) CAPS Take 10,000 Units by mouth daily.     Marland Kitchen FARXIGA 10 MG TABS tablet Take 10 mg by mouth daily.    Marland Kitchen gabapentin (NEURONTIN) 100 MG capsule Take 100-200 mg by mouth See admin instructions. Take 2 capsules (200 mg) by mouth in the morning and take 1 capsule (100mg ) at night.    Marland Kitchen  Melatonin 10 MG TABS Take 10 mg by mouth at bedtime as needed (sleep).    . NOVOLOG FLEXPEN 100 UNIT/ML FlexPen Inject 4-6 Units into the skin in the morning, at noon, and at bedtime.     Marland Kitchen ofloxacin (OCUFLOX) 0.3 % ophthalmic solution     . oxyCODONE 10 MG TABS Take 0.5 tablets (5 mg total) by mouth every 4 (four) hours as needed for severe pain or breakthrough pain. 10 tablet 0  . prednisoLONE acetate (PRED FORTE) 1 % ophthalmic suspension SMARTSIG:In Eye(s)    . sertraline (ZOLOFT) 50 MG tablet Take 50 mg by mouth daily.     . traZODone (DESYREL) 50 MG tablet Take 50  mg by mouth at bedtime.    Tyler Aas FLEXTOUCH 200 UNIT/ML SOPN Inject 20 Units into the skin daily before breakfast.    . TRULICITY 8.29 HB/7.1IR SOPN Inject 0.75 mg into the muscle every Sunday.     . vitamin B-12 (CYANOCOBALAMIN) 1000 MCG tablet Take 2,000 mcg by mouth daily.     No current facility-administered medications for this visit.   Facility-Administered Medications Ordered in Other Visits  Medication Dose Route Frequency Provider Last Rate Last Admin  . heparin lock flush 100 unit/mL  500 Units Intracatheter Once PRN Derek Jack, MD      . sodium chloride flush (NS) 0.9 % injection 10 mL  10 mL Intracatheter PRN Derek Jack, MD   10 mL at 03/03/19 1152   Allergies:  Sulfa antibiotics   Social History: The patient  reports that she has never smoked. She has never used smokeless tobacco. She reports previous alcohol use. She reports that she does not use drugs.   Family History: The patient's family history includes Breast cancer in her niece and niece; Cataracts in her brother, mother, and sister; Diabetes in her brother, father, maternal grandfather, and sister; Glaucoma in her mother; Heart attack in her father; Heart disease in her brother, mother, and sister; Lung disease in her sister; Macular degeneration in her maternal aunt; Throat cancer in her son.   ROS:  Please see the history of present illness. Otherwise, complete review of systems is positive for {NONE DEFAULTED:18576::"none"}.  All other systems are reviewed and negative.   Physical Exam: VS:  There were no vitals taken for this visit., BMI There is no height or weight on file to calculate BMI.  Wt Readings from Last 3 Encounters:  03/25/20 158 lb 6.4 oz (71.8 kg)  03/01/20 158 lb (71.7 kg)  10/31/19 175 lb (79.4 kg)    General: Patient appears comfortable at rest. HEENT: Conjunctiva and lids normal, oropharynx clear with moist mucosa. Neck: Supple, no elevated JVP or carotid bruits, no  thyromegaly. Lungs: Clear to auscultation, nonlabored breathing at rest. Cardiac: Regular rate and rhythm, no S3 or significant systolic murmur, no pericardial rub. Abdomen: Soft, nontender, no hepatomegaly, bowel sounds present, no guarding or rebound. Extremities: No pitting edema, distal pulses 2+. Skin: Warm and dry. Musculoskeletal: No kyphosis. Neuropsychiatric: Alert and oriented x3, affect grossly appropriate.  ECG:  An ECG dated 10/16/2019 was personally reviewed today and demonstrated:  Sinus rhythm with atrial bigeminy and frequent PVCs, nonspecific T wave changes.  Recent Labwork: 05/21/2019: B Natriuretic Peptide 683.0 05/22/2019: TSH 1.461 05/23/2019: Magnesium 1.8 03/21/2020: ALT 21; AST 17; BUN 21; Creatinine, Ser 0.63; Hemoglobin 14.2; Platelets 209; Potassium 3.8; Sodium 133   Other Studies Reviewed Today:  Echocardiogram 05/22/2019: 1. Left ventricular ejection fraction, by visual estimation, is 50 to  55%. The left ventricle has low normal function. There is no left  ventricular hypertrophy.  2. Basal inferolateral segment and basal inferior segment are abnormal.  3. Left ventricular diastolic parameters are consistent with Grade I  diastolic dysfunction (impaired relaxation).  4. The left ventricle demonstrates regional wall motion abnormalities.  5. Global right ventricle has normal systolic function.The right  ventricular size is normal. No increase in right ventricular wall  thickness.  6. Left atrial size was moderately dilated.  7. Right atrial size was normal.  8. The mitral valve is grossly normal. Mild mitral valve regurgitation.  9. The tricuspid valve is grossly normal.  10. The aortic valve is tricuspid. Aortic valve regurgitation is not  visualized.  11. The pulmonic valve was grossly normal. Pulmonic valve regurgitation is  trivial.  12. Mildly elevated pulmonary artery systolic pressure.  13. The tricuspid regurgitant velocity is 2.30 m/s, and  with an assumed  right atrial pressure of 15 mmHg, the estimated right ventricular systolic  pressure is mildly elevated at 36.2 mmHg.  14. The inferior vena cava is dilated in size with <50% respiratory  variability, suggesting right atrial pressure of 15 mmHg.   Assessment and Plan:    Medication Adjustments/Labs and Tests Ordered: Current medicines are reviewed at length with the patient today.  Concerns regarding medicines are outlined above.   Tests Ordered: No orders of the defined types were placed in this encounter.   Medication Changes: No orders of the defined types were placed in this encounter.   Disposition:  Follow up {follow up:15908}  Signed, Satira Sark, MD, [facility] 04/26/2020 9:54 AM    [facility] [place], [place], [place] [address] Phone: [phone]; Fax: [phone]

## 2020-05-09 ENCOUNTER — Telehealth: Payer: Self-pay | Admitting: *Deleted

## 2020-05-09 NOTE — Telephone Encounter (Signed)
CALLED PATIENT TO ALTER FU DUE TO PROVIDER BEING ON VACATION, RESCHEDULED FOR 05-20-20 @ 1:30 PM, PATIENT AGREED TO NEW TIME AND DATE

## 2020-05-13 ENCOUNTER — Encounter: Payer: Self-pay | Admitting: Radiation Oncology

## 2020-05-13 ENCOUNTER — Telehealth: Payer: Self-pay

## 2020-05-13 ENCOUNTER — Ambulatory Visit: Payer: Medicare HMO | Admitting: Radiation Oncology

## 2020-05-13 ENCOUNTER — Other Ambulatory Visit: Payer: Self-pay

## 2020-05-13 NOTE — Telephone Encounter (Signed)
Spoke with patient in regards to telephone follow-up with Laurence Aly PA on 05/20/20 @ 1:30pm. Patient verbalized understanding of appointment date and time. Reviewed meaningful use questions.TM

## 2020-05-20 ENCOUNTER — Ambulatory Visit
Admission: RE | Admit: 2020-05-20 | Discharge: 2020-05-20 | Disposition: A | Discharge status: Home / Self Care | Payer: Medicare HMO | Source: Ambulatory Visit | Attending: Radiation Oncology | Admitting: Radiation Oncology

## 2020-05-20 ENCOUNTER — Other Ambulatory Visit: Payer: Self-pay

## 2020-05-20 DIAGNOSIS — C210 Malignant neoplasm of anus, unspecified: Secondary | ICD-10-CM

## 2020-05-20 NOTE — Progress Notes (Signed)
I called and left two separate messages trying to reach the patient today for her appointment.    Osker Mason, PAC

## 2020-05-21 ENCOUNTER — Telehealth: Payer: Self-pay | Admitting: Radiation Oncology

## 2020-05-21 NOTE — Telephone Encounter (Signed)
After seeing a note stating to call the patient's daughter Jacqueline Bird), I reached out to her to see if I can reschedule her mother's appointment. I did not receive an answer, left a vm for a return call.

## 2020-05-21 NOTE — Telephone Encounter (Signed)
Called pt to reschedule her 1/3 follow up with Laurence Aly PA, no answer, voicemail full

## 2020-05-28 DIAGNOSIS — H338 Other retinal detachments: Secondary | ICD-10-CM | POA: Diagnosis not present

## 2020-05-28 DIAGNOSIS — Z48810 Encounter for surgical aftercare following surgery on the sense organs: Secondary | ICD-10-CM | POA: Diagnosis not present

## 2020-05-28 DIAGNOSIS — T85398A Other mechanical complication of other ocular prosthetic devices, implants and grafts, initial encounter: Secondary | ICD-10-CM | POA: Diagnosis not present

## 2020-05-28 DIAGNOSIS — H26492 Other secondary cataract, left eye: Secondary | ICD-10-CM | POA: Diagnosis not present

## 2020-05-28 DIAGNOSIS — Z8669 Personal history of other diseases of the nervous system and sense organs: Secondary | ICD-10-CM | POA: Diagnosis not present

## 2020-06-02 DIAGNOSIS — E1165 Type 2 diabetes mellitus with hyperglycemia: Secondary | ICD-10-CM | POA: Diagnosis not present

## 2020-06-11 DIAGNOSIS — E113512 Type 2 diabetes mellitus with proliferative diabetic retinopathy with macular edema, left eye: Secondary | ICD-10-CM | POA: Diagnosis not present

## 2020-06-11 DIAGNOSIS — E113591 Type 2 diabetes mellitus with proliferative diabetic retinopathy without macular edema, right eye: Secondary | ICD-10-CM | POA: Diagnosis not present

## 2020-06-11 DIAGNOSIS — H35372 Puckering of macula, left eye: Secondary | ICD-10-CM | POA: Diagnosis not present

## 2020-06-11 DIAGNOSIS — H3562 Retinal hemorrhage, left eye: Secondary | ICD-10-CM | POA: Diagnosis not present

## 2020-06-11 DIAGNOSIS — H3582 Retinal ischemia: Secondary | ICD-10-CM | POA: Diagnosis not present

## 2020-06-15 DIAGNOSIS — E7849 Other hyperlipidemia: Secondary | ICD-10-CM | POA: Diagnosis not present

## 2020-06-15 DIAGNOSIS — I10 Essential (primary) hypertension: Secondary | ICD-10-CM | POA: Diagnosis not present

## 2020-06-24 ENCOUNTER — Inpatient Hospital Stay (HOSPITAL_COMMUNITY): Payer: Medicare HMO | Attending: Hematology

## 2020-07-03 DIAGNOSIS — E113512 Type 2 diabetes mellitus with proliferative diabetic retinopathy with macular edema, left eye: Secondary | ICD-10-CM | POA: Diagnosis not present

## 2020-07-03 DIAGNOSIS — E1165 Type 2 diabetes mellitus with hyperglycemia: Secondary | ICD-10-CM | POA: Diagnosis not present

## 2020-07-15 DIAGNOSIS — I10 Essential (primary) hypertension: Secondary | ICD-10-CM | POA: Diagnosis not present

## 2020-07-15 DIAGNOSIS — E7849 Other hyperlipidemia: Secondary | ICD-10-CM | POA: Diagnosis not present

## 2020-07-22 DIAGNOSIS — E113512 Type 2 diabetes mellitus with proliferative diabetic retinopathy with macular edema, left eye: Secondary | ICD-10-CM | POA: Diagnosis not present

## 2020-08-08 DIAGNOSIS — E113512 Type 2 diabetes mellitus with proliferative diabetic retinopathy with macular edema, left eye: Secondary | ICD-10-CM | POA: Diagnosis not present

## 2020-08-14 DIAGNOSIS — E7849 Other hyperlipidemia: Secondary | ICD-10-CM | POA: Diagnosis not present

## 2020-08-14 DIAGNOSIS — I10 Essential (primary) hypertension: Secondary | ICD-10-CM | POA: Diagnosis not present

## 2020-09-12 DIAGNOSIS — E113512 Type 2 diabetes mellitus with proliferative diabetic retinopathy with macular edema, left eye: Secondary | ICD-10-CM | POA: Diagnosis not present

## 2020-09-13 DIAGNOSIS — S81812A Laceration without foreign body, left lower leg, initial encounter: Secondary | ICD-10-CM | POA: Diagnosis not present

## 2020-09-15 DIAGNOSIS — E1165 Type 2 diabetes mellitus with hyperglycemia: Secondary | ICD-10-CM | POA: Diagnosis not present

## 2020-09-15 DIAGNOSIS — E7849 Other hyperlipidemia: Secondary | ICD-10-CM | POA: Diagnosis not present

## 2020-09-15 DIAGNOSIS — T148XXD Other injury of unspecified body region, subsequent encounter: Secondary | ICD-10-CM | POA: Diagnosis not present

## 2020-09-15 DIAGNOSIS — I10 Essential (primary) hypertension: Secondary | ICD-10-CM | POA: Diagnosis not present

## 2020-09-18 DIAGNOSIS — T148XXD Other injury of unspecified body region, subsequent encounter: Secondary | ICD-10-CM | POA: Diagnosis not present

## 2020-09-18 DIAGNOSIS — E1165 Type 2 diabetes mellitus with hyperglycemia: Secondary | ICD-10-CM | POA: Diagnosis not present

## 2020-09-24 DIAGNOSIS — T85398A Other mechanical complication of other ocular prosthetic devices, implants and grafts, initial encounter: Secondary | ICD-10-CM | POA: Diagnosis not present

## 2020-09-24 DIAGNOSIS — Z48810 Encounter for surgical aftercare following surgery on the sense organs: Secondary | ICD-10-CM | POA: Diagnosis not present

## 2020-09-24 DIAGNOSIS — H338 Other retinal detachments: Secondary | ICD-10-CM | POA: Diagnosis not present

## 2020-09-24 DIAGNOSIS — Z8669 Personal history of other diseases of the nervous system and sense organs: Secondary | ICD-10-CM | POA: Diagnosis not present

## 2020-09-25 ENCOUNTER — Inpatient Hospital Stay (HOSPITAL_COMMUNITY): Payer: Medicare HMO

## 2020-09-25 ENCOUNTER — Ambulatory Visit (HOSPITAL_COMMUNITY): Admission: RE | Admit: 2020-09-25 | Payer: Medicare HMO | Source: Ambulatory Visit

## 2020-09-26 DIAGNOSIS — T148XXD Other injury of unspecified body region, subsequent encounter: Secondary | ICD-10-CM | POA: Diagnosis not present

## 2020-09-26 DIAGNOSIS — E1165 Type 2 diabetes mellitus with hyperglycemia: Secondary | ICD-10-CM | POA: Diagnosis not present

## 2020-09-30 ENCOUNTER — Ambulatory Visit (HOSPITAL_COMMUNITY): Payer: Medicare HMO | Admitting: Hematology

## 2020-09-30 ENCOUNTER — Encounter (HOSPITAL_COMMUNITY): Payer: Medicare HMO

## 2020-09-30 DIAGNOSIS — E1165 Type 2 diabetes mellitus with hyperglycemia: Secondary | ICD-10-CM | POA: Diagnosis not present

## 2020-10-02 DIAGNOSIS — H43392 Other vitreous opacities, left eye: Secondary | ICD-10-CM | POA: Diagnosis not present

## 2020-10-02 DIAGNOSIS — H3582 Retinal ischemia: Secondary | ICD-10-CM | POA: Diagnosis not present

## 2020-10-02 DIAGNOSIS — H3562 Retinal hemorrhage, left eye: Secondary | ICD-10-CM | POA: Diagnosis not present

## 2020-10-02 DIAGNOSIS — H34832 Tributary (branch) retinal vein occlusion, left eye, with macular edema: Secondary | ICD-10-CM | POA: Diagnosis not present

## 2020-10-02 DIAGNOSIS — H3322 Serous retinal detachment, left eye: Secondary | ICD-10-CM | POA: Diagnosis not present

## 2020-10-02 DIAGNOSIS — H31093 Other chorioretinal scars, bilateral: Secondary | ICD-10-CM | POA: Diagnosis not present

## 2020-10-02 DIAGNOSIS — E113512 Type 2 diabetes mellitus with proliferative diabetic retinopathy with macular edema, left eye: Secondary | ICD-10-CM | POA: Diagnosis not present

## 2020-10-09 DIAGNOSIS — Z8542 Personal history of malignant neoplasm of other parts of uterus: Secondary | ICD-10-CM | POA: Diagnosis not present

## 2020-10-09 DIAGNOSIS — H6123 Impacted cerumen, bilateral: Secondary | ICD-10-CM | POA: Diagnosis not present

## 2020-10-09 DIAGNOSIS — Z89119 Acquired absence of unspecified hand: Secondary | ICD-10-CM | POA: Diagnosis not present

## 2020-10-15 DIAGNOSIS — E7849 Other hyperlipidemia: Secondary | ICD-10-CM | POA: Diagnosis not present

## 2020-10-15 DIAGNOSIS — I10 Essential (primary) hypertension: Secondary | ICD-10-CM | POA: Diagnosis not present

## 2020-10-17 DIAGNOSIS — E113512 Type 2 diabetes mellitus with proliferative diabetic retinopathy with macular edema, left eye: Secondary | ICD-10-CM | POA: Diagnosis not present

## 2020-10-24 DIAGNOSIS — Z974 Presence of external hearing-aid: Secondary | ICD-10-CM | POA: Diagnosis not present

## 2020-10-24 DIAGNOSIS — H903 Sensorineural hearing loss, bilateral: Secondary | ICD-10-CM | POA: Diagnosis not present

## 2020-10-24 DIAGNOSIS — H60312 Diffuse otitis externa, left ear: Secondary | ICD-10-CM | POA: Diagnosis not present

## 2020-10-24 DIAGNOSIS — H6122 Impacted cerumen, left ear: Secondary | ICD-10-CM | POA: Diagnosis not present

## 2020-10-25 ENCOUNTER — Inpatient Hospital Stay (HOSPITAL_COMMUNITY): Payer: Medicare HMO | Attending: Hematology

## 2020-10-25 ENCOUNTER — Ambulatory Visit (HOSPITAL_COMMUNITY)
Admission: RE | Admit: 2020-10-25 | Discharge: 2020-10-25 | Disposition: A | Discharge status: Home / Self Care | Payer: Medicare HMO | Source: Ambulatory Visit | Attending: Hematology | Admitting: Hematology

## 2020-10-25 ENCOUNTER — Other Ambulatory Visit: Payer: Self-pay

## 2020-10-25 DIAGNOSIS — Z452 Encounter for adjustment and management of vascular access device: Secondary | ICD-10-CM | POA: Insufficient documentation

## 2020-10-25 DIAGNOSIS — Z803 Family history of malignant neoplasm of breast: Secondary | ICD-10-CM | POA: Insufficient documentation

## 2020-10-25 DIAGNOSIS — C210 Malignant neoplasm of anus, unspecified: Secondary | ICD-10-CM | POA: Insufficient documentation

## 2020-10-25 DIAGNOSIS — E785 Hyperlipidemia, unspecified: Secondary | ICD-10-CM | POA: Insufficient documentation

## 2020-10-25 DIAGNOSIS — Z794 Long term (current) use of insulin: Secondary | ICD-10-CM | POA: Insufficient documentation

## 2020-10-25 DIAGNOSIS — Z8541 Personal history of malignant neoplasm of cervix uteri: Secondary | ICD-10-CM | POA: Insufficient documentation

## 2020-10-25 DIAGNOSIS — I10 Essential (primary) hypertension: Secondary | ICD-10-CM | POA: Insufficient documentation

## 2020-10-25 DIAGNOSIS — Z79899 Other long term (current) drug therapy: Secondary | ICD-10-CM | POA: Insufficient documentation

## 2020-10-25 DIAGNOSIS — E119 Type 2 diabetes mellitus without complications: Secondary | ICD-10-CM | POA: Insufficient documentation

## 2020-10-25 DIAGNOSIS — C44520 Squamous cell carcinoma of anal skin: Secondary | ICD-10-CM | POA: Diagnosis not present

## 2020-10-25 LAB — POCT I-STAT CREATININE: Creatinine, Ser: 0.7 mg/dL (ref 0.44–1.00)

## 2020-10-25 MED ORDER — IOHEXOL 300 MG/ML  SOLN
100.0000 mL | Freq: Once | INTRAMUSCULAR | Status: AC | PRN
  Administered 2020-10-25: 11:00:00 100 mL via INTRAVENOUS

## 2020-10-28 ENCOUNTER — Other Ambulatory Visit (HOSPITAL_COMMUNITY): Payer: Self-pay

## 2020-10-28 ENCOUNTER — Other Ambulatory Visit (HOSPITAL_COMMUNITY): Payer: Medicare HMO

## 2020-10-28 ENCOUNTER — Other Ambulatory Visit: Payer: Self-pay

## 2020-10-28 ENCOUNTER — Inpatient Hospital Stay (HOSPITAL_COMMUNITY): Payer: Medicare HMO

## 2020-10-28 DIAGNOSIS — E785 Hyperlipidemia, unspecified: Secondary | ICD-10-CM | POA: Diagnosis not present

## 2020-10-28 DIAGNOSIS — Z794 Long term (current) use of insulin: Secondary | ICD-10-CM | POA: Diagnosis not present

## 2020-10-28 DIAGNOSIS — C210 Malignant neoplasm of anus, unspecified: Secondary | ICD-10-CM

## 2020-10-28 DIAGNOSIS — I10 Essential (primary) hypertension: Secondary | ICD-10-CM | POA: Diagnosis not present

## 2020-10-28 DIAGNOSIS — Z8541 Personal history of malignant neoplasm of cervix uteri: Secondary | ICD-10-CM | POA: Diagnosis not present

## 2020-10-28 DIAGNOSIS — Z452 Encounter for adjustment and management of vascular access device: Secondary | ICD-10-CM | POA: Diagnosis not present

## 2020-10-28 DIAGNOSIS — Z803 Family history of malignant neoplasm of breast: Secondary | ICD-10-CM | POA: Diagnosis not present

## 2020-10-28 DIAGNOSIS — E119 Type 2 diabetes mellitus without complications: Secondary | ICD-10-CM | POA: Diagnosis not present

## 2020-10-28 DIAGNOSIS — Z79899 Other long term (current) drug therapy: Secondary | ICD-10-CM | POA: Diagnosis not present

## 2020-10-28 LAB — COMPREHENSIVE METABOLIC PANEL
ALT: 19 U/L (ref 0–44)
AST: 18 U/L (ref 15–41)
Albumin: 3.5 g/dL (ref 3.5–5.0)
Alkaline Phosphatase: 66 U/L (ref 38–126)
Anion gap: 6 (ref 5–15)
BUN: 15 mg/dL (ref 8–23)
CO2: 27 mmol/L (ref 22–32)
Calcium: 8.9 mg/dL (ref 8.9–10.3)
Chloride: 104 mmol/L (ref 98–111)
Creatinine, Ser: 0.54 mg/dL (ref 0.44–1.00)
GFR, Estimated: >60 mL/min (ref >60)
Glucose, Bld: 145 mg/dL — ABNORMAL HIGH (ref 70–99)
Potassium: 4.3 mmol/L (ref 3.5–5.1)
Sodium: 137 mmol/L (ref 135–145)
Total Bilirubin: 0.5 mg/dL (ref 0.3–1.2)
Total Protein: 7.1 g/dL (ref 6.5–8.1)

## 2020-10-28 LAB — CBC WITH DIFFERENTIAL/PLATELET
Abs Immature Granulocytes: 0.02 10*3/uL (ref 0.00–0.07)
Basophils Absolute: 0.1 10*3/uL (ref 0.0–0.1)
Basophils Relative: 1 %
Eosinophils Absolute: 0.1 10*3/uL (ref 0.0–0.5)
Eosinophils Relative: 2 %
HCT: 42.9 % (ref 36.0–46.0)
Hemoglobin: 14.1 g/dL (ref 12.0–15.0)
Immature Granulocytes: 0 %
Lymphocytes Relative: 18 %
Lymphs Abs: 1.1 10*3/uL (ref 0.7–4.0)
MCH: 30.3 pg (ref 26.0–34.0)
MCHC: 32.9 g/dL (ref 30.0–36.0)
MCV: 92.3 fL (ref 80.0–100.0)
Monocytes Absolute: 0.6 10*3/uL (ref 0.1–1.0)
Monocytes Relative: 10 %
Neutro Abs: 3.9 10*3/uL (ref 1.7–7.7)
Neutrophils Relative %: 69 %
Platelets: 223 10*3/uL (ref 150–400)
RBC: 4.65 MIL/uL (ref 3.87–5.11)
RDW: 13.1 % (ref 11.5–15.5)
WBC: 5.8 10*3/uL (ref 4.0–10.5)
nRBC: 0 % (ref 0.0–0.2)

## 2020-10-28 LAB — MAGNESIUM: Magnesium: 2 mg/dL (ref 1.7–2.4)

## 2020-10-29 NOTE — Progress Notes (Signed)
[place], [place] [address]   CLINIC:  Medical Oncology/Hematology  PCP:  Celene Squibb, MD [address] [place] [address] [phone]   REASON FOR VISIT:  Follow-up for anal cancer  PRIOR THERAPY: 5-FU & mitomycin from 03/03/2019 to 03/31/2019  NGS Results: not done  CURRENT THERAPY: surveillance  BRIEF ONCOLOGIC HISTORY:  Oncology History  Anal cancer ([place])  02/01/2019 Initial Diagnosis   Anal cancer ([place])    02/16/2019 Cancer Staging   Staging form: Anus, AJCC 8th Edition - Clinical: Stage IIIC (cT3, cN1c, cM0) - Signed by Kyung Rudd, MD on 02/16/2019    03/03/2019 -  Chemotherapy   The patient had mitoMYcin (MUTAMYCIN) chemo injection 20 mg, 9.7 mg/m2 = 21 mg, Intravenous,  Once, 1 of 1 cycle Dose modification: 8 mg/m2 (original dose 10 mg/m2, Cycle 1, Reason: Provider Judgment) Administration: 20 mg (03/03/2019), 16.5 mg (03/31/2019) fluorouracil (ADRUCIL) 8,300 mg in sodium chloride 0.9 % 84 mL chemo infusion, 1,000 mg/m2/day = 8,300 mg, Intravenous, 4D (96 hours ), 1 of 1 cycle Administration: 8,300 mg (03/03/2019), 8,300 mg (03/31/2019)   for chemotherapy treatment.       CANCER STAGING: Cancer Staging Anal cancer [facility]) Staging form: Anus, AJCC 8th Edition - Clinical: Stage IIIC (cT3, cN1c, cM0) - Signed by Kyung Rudd, MD on 02/16/2019   INTERVAL HISTORY:  [name], a 70 y.o. female, returns for routine follow-up of her anal cancer. Jacqueline Bird was last seen on 03/25/2020.   Today she reports feeling well. She reports normal BM. She take Xanax prn.   REVIEW OF SYSTEMS:  Review of Systems  Constitutional:  Positive for fatigue (60%). Negative for appetite change.  All other systems reviewed and are negative.  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    Cervical cancer ([place])    Depression    Essential hypertension    History of cervical cancer    Mixed hyperlipidemia    Type 2 diabetes mellitus [facility])    Past Surgical History:  Procedure Laterality Date   AIR/FLUID EXCHANGE Left 03/01/2020   Procedure: AIR/FLUID EXCHANGE;  Surgeon: Jalene Mullet, MD;  Location: [place];  Service: Ophthalmology;  Laterality: Left;   CATARACT EXTRACTION, BILATERAL Right 05/2017   CERVICAL CONE BIOPSY     HAND AMPUTATION Left    INJECTION OF SILICONE OIL Left 96/29/5284   Procedure: INJECTION OF SILICONE OIL;  Surgeon: Jalene Mullet, MD;  Location: [place];  Service: Ophthalmology;  Laterality: Left;   PARS PLANA VITRECTOMY Left 03/01/2020   Procedure: PARS PLANA VITRECTOMY WITH 25 GAUGE;  Surgeon: Jalene Mullet, MD;  Location: [place];  Service: Ophthalmology;  Laterality: Left;   PHOTOCOAGULATION WITH LASER Left 03/01/2020   Procedure: PHOTOCOAGULATION WITH LASER;  Surgeon: Jalene Mullet, MD;  Location: [place];  Service: Ophthalmology;  Laterality: Left;   PORTACATH PLACEMENT Left 02/08/2019   Procedure: INSERTION PORT-A-CATH;  Surgeon: Virl Cagey, MD;  [facility];  Service: General;  Laterality: Left;   RECTAL BIOPSY  01/26/2019   Procedure: BIOPSY OF ANAL MASS;  Surgeon: Virl Cagey, MD;  Location[facility];  Service: General;;   RECTAL BIOPSY N/A 11/13/2019   Procedure: BIOPSY ANAL AREA;  Surgeon: Virl Cagey, MD;  Location[facility];  Service: General;  Laterality: N/A;   RECTAL EXAM UNDER ANESTHESIA N/A 01/26/2019   Procedure: RECTAL EXAM UNDER ANESTHESIA;  Surgeon: Virl Cagey, MD;  Location[facility];  Service: General;  Laterality: N/A;   RECTAL EXAM UNDER ANESTHESIA N/A 11/13/2019   Procedure: RECTAL EXAM UNDER ANESTHESIA  WITH ANAL BIOPSY;  Surgeon: Virl Cagey, MD;  Location[facility];  Service: General;  Laterality: N/A;   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 03/01/2020   Procedure: REPAIR OF  COMPLEX TRACTION RETINAL DETACHMENT;  Surgeon: Jalene Mullet, MD;  Location: [place];  Service: Ophthalmology;  Laterality: Left;   RETINAL DETACHMENT SURGERY Bilateral 03/11/2020    SOCIAL HISTORY:  Social History   Socioeconomic History   Marital status: Single    Spouse name: Not on file   Number of children: 2   Years of education: Not on file   Highest education level: Not on file  Occupational History   Occupation: retired  Tobacco Use   Smoking status: Never   Smokeless tobacco: Never  Vaping Use   Vaping Use: Never used  Substance and Sexual Activity   Alcohol use: Not Currently   Drug use: Never   Sexual activity: Not on file  Other Topics Concern   Not on file  Social History Narrative   Pt is retired and currently engaged.   Social Determinants of Health   Financial Resource Strain: Not on file  Food Insecurity: Not on file  Transportation Needs: Not on file  Physical Activity: Not on file  Stress: Not on file  Social Connections: Not on file  Intimate Partner Violence: Not on file    FAMILY HISTORY:  Family History  Problem Relation Age of Onset   Cataracts Mother    Glaucoma Mother    Heart disease Mother    Diabetes Father    Heart attack Father    Cataracts Sister    Lung disease Sister    Cataracts Brother    Diabetes Brother    Heart disease Brother    Macular degeneration Maternal Aunt    Diabetes Maternal Grandfather    Heart disease Sister    Diabetes Sister    Throat cancer Son    Breast cancer Niece    Breast cancer Niece    Amblyopia Neg Hx    Blindness Neg Hx    Retinal detachment Neg Hx    Strabismus Neg Hx    Retinitis pigmentosa Neg Hx     CURRENT MEDICATIONS:  Current Outpatient Medications  Medication Sig Dispense Refill   acetaminophen (TYLENOL) 500 MG tablet Take 500-1,000 mg by mouth every 6 (six) hours as needed (for pain.).     ALPRAZolam (XANAX) 0.5 MG tablet TAKE 1 TABLET BY MOUTH AT BEDTIME AS NEEDED FOR  ANXIETY / SLEEP 30 tablet 5   atorvastatin (LIPITOR) 80 MG tablet Take 80 mg by mouth every evening.      Cholecalciferol (VITAMIN D) 125 MCG (5000 UT) CAPS Take 10,000 Units by mouth daily.      FARXIGA 10 MG TABS tablet Take 10 mg by mouth daily.     gabapentin (NEURONTIN) 100 MG capsule Take 100-200 mg by mouth See admin instructions. Take 2 capsules (200 mg) by mouth in the morning and take 1 capsule (100mg ) at night.     Melatonin 10 MG TABS Take 10 mg by mouth at bedtime as needed (sleep).     NOVOLOG FLEXPEN 100 UNIT/ML FlexPen Inject 4-6 Units into the skin in the  morning, at noon, and at bedtime.      ofloxacin (OCUFLOX) 0.3 % ophthalmic solution      oxyCODONE 10 MG TABS Take 0.5 tablets (5 mg total) by mouth every 4 (four) hours as needed for severe pain or breakthrough pain. 10 tablet 0   prednisoLONE acetate (PRED FORTE) 1 % ophthalmic suspension SMARTSIG:In Eye(s)     sertraline (ZOLOFT) 50 MG tablet Take 50 mg by mouth daily.      traZODone (DESYREL) 50 MG tablet Take 50 mg by mouth at bedtime.     TRESIBA FLEXTOUCH 200 UNIT/ML SOPN Inject 20 Units into the skin daily before breakfast.     TRULICITY 0.10 XN/2.3FT SOPN Inject 0.75 mg into the muscle every Sunday.      vitamin B-12 (CYANOCOBALAMIN) 1000 MCG tablet Take 2,000 mcg by mouth daily.     No current facility-administered medications for this visit.   Facility-Administered Medications Ordered in Other Visits  Medication Dose Route Frequency Provider Last Rate Last Admin   heparin lock flush 100 unit/mL  500 Units Intracatheter Once PRN Derek Jack, MD       sodium chloride flush (NS) 0.9 % injection 10 mL  10 mL Intracatheter PRN Derek Jack, MD   10 mL at 03/03/19 1152    ALLERGIES:  Allergies  Allergen Reactions   Sulfa Antibiotics Anaphylaxis and Swelling    Per pt, can use creams with sulfa in it    PHYSICAL EXAM:  Performance status (ECOG): 1 - Symptomatic but completely ambulatory  There  were no vitals filed for this visit. Wt Readings from Last 3 Encounters:  03/25/20 158 lb 6.4 oz (71.8 kg)  03/01/20 158 lb (71.7 kg)  10/31/19 175 lb (79.4 kg)   Physical Exam Vitals reviewed.  Constitutional:      Appearance: Normal appearance.  Cardiovascular:     Rate and Rhythm: Normal rate and regular rhythm.     Pulses: Normal pulses.     Heart sounds: Normal heart sounds.  Pulmonary:     Effort: Pulmonary effort is normal.     Breath sounds: Normal breath sounds.  Abdominal:     Palpations: Abdomen is soft. There is no hepatomegaly, splenomegaly or mass.     Tenderness: There is no abdominal tenderness.  Musculoskeletal:     Right lower leg: No edema.     Left lower leg: No edema.  Lymphadenopathy:     Lower Body: No right inguinal adenopathy. No left inguinal adenopathy.  Neurological:     General: No focal deficit present.     Mental Status: She is alert and oriented to person, place, and time.  Psychiatric:        Mood and Affect: Mood normal.        Behavior: Behavior normal.     LABORATORY DATA:  I have reviewed the labs as listed.  CBC Latest Ref Rng & Units 10/28/2020 03/21/2020 03/01/2020  WBC 4.0 - 10.5 K/uL 5.8 7.5 5.5  Hemoglobin 12.0 - 15.0 g/dL 14.1 14.2 14.1  Hematocrit 36.0 - 46.0 % 42.9 42.9 43.7  Platelets 150 - 400 K/uL 223 209 212   CMP Latest Ref Rng & Units 10/28/2020 10/25/2020 03/21/2020  Glucose 70 - 99 mg/dL 145(H) - 160(H)  BUN 8 - 23 mg/dL 15 - 21  Creatinine 0.44 - 1.00 mg/dL 0.54 0.70 0.63  Sodium 135 - 145 mmol/L 137 - 133(L)  Potassium 3.5 - 5.1 mmol/L 4.3 - 3.8  Chloride 98 - 111 mmol/L  104 - 99  CO2 22 - 32 mmol/L 27 - 28  Calcium 8.9 - 10.3 mg/dL 8.9 - 9.1  Total Protein 6.5 - 8.1 g/dL 7.1 - 7.1  Total Bilirubin 0.3 - 1.2 mg/dL 0.5 - 0.6  Alkaline Phos 38 - 126 U/L 66 - 89  AST 15 - 41 U/L 18 - 17  ALT 0 - 44 U/L 19 - 21    DIAGNOSTIC IMAGING:  I have independently reviewed the scans and discussed with the patient. CT  Abdomen Pelvis W Contrast  Result Date: 10/26/2020 CLINICAL DATA:  Follow up squamous cell anal carcinoma diagnosed in September 2020 post chemotherapy and radiation therapy. EXAM: CT ABDOMEN AND PELVIS WITH CONTRAST TECHNIQUE: Multidetector CT imaging of the abdomen and pelvis was performed using the standard protocol following bolus administration of intravenous contrast. CONTRAST:  190mL OMNIPAQUE IOHEXOL 300 MG/ML  SOLN COMPARISON:  Abdominopelvic CT 03/21/2020 and PET-CT 10/23/2019. FINDINGS: Lower chest: Clear lung bases. No significant pleural or pericardial effusion. Hepatobiliary: Stable findings suspicious for mild cirrhosis. No focal lesion or abnormal enhancement identified. No evidence of gallstones, gallbladder wall thickening or biliary dilatation. Pancreas: Unremarkable. No pancreatic ductal dilatation or surrounding inflammatory changes. Spleen: Upper limits of normal in size without focal abnormality. Adrenals/Urinary Tract: Both adrenal glands appear normal. The kidneys appear normal without evidence of urinary tract calculus, suspicious lesion or hydronephrosis. No bladder abnormalities are seen. Stomach/Bowel: Enteric contrast was administered and has passed to the splenic flexure of the colon. The stomach appears unremarkable for its degree of distension. No evidence of bowel wall thickening, distention or surrounding inflammatory change. The appendix appears normal. Previously demonstrated rectal wall thickening has improved. No focal anal mass identified. Vascular/Lymphatic: There are no enlarged abdominal or pelvic lymph nodes. Stable small retroperitoneal lymph nodes. Aortic and branch vessel atherosclerosis without acute vascular findings. The portal, superior mesenteric and splenic veins are patent. Reproductive: The uterus and ovaries appear normal. No adnexal mass. Other: No ascites or peritoneal nodularity. Postsurgical changes in the suprapubic area without hernia. Musculoskeletal:  No acute or significant osseous findings. IMPRESSION: 1. No evidence of local recurrence or metastatic disease. 2. Previously demonstrated mild rectal wall thickening has improved, likely treatment related. 3. Stable findings suspicious for mild hepatic cirrhosis. No focal liver lesion. 4.  Aortic Atherosclerosis (ICD10-I70.0). Electronically Signed   By: Richardean Sale M.D.   On: 10/26/2020 12:19     ASSESSMENT:  1.  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 -PET scan on 10/23/2019 showed no specific worrisome recurrent hypermetabolic anal activity. Maximum SUV in the anal region was 4.1, previously 4.3. No recurrent pathologically enlarged or hypermetabolic inguinal or external iliac lymph nodes. No metastatic spread. -Biopsy of the anal ulcer consistent with benign inflamed squamous mucosa with ulceration. -CTAP on 03/21/2020 with no acute process or evidence of metastatic disease in the abdomen or pelvis.  Lower rectal wall thickening is mild, most likely treatment related.  Mild cirrhosis.   PLAN:  1.  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 - Reviewed CTAP with contrast from 10/25/2020 which did not show any evidence of local recurrence or metastatic disease.  Previously demonstrated mild rectal wall thickening has improved. - She was found to have incidental cirrhosis on the CT scan.  She will be referred to GI for further evaluation. - I have recommended follow-up with  Dr. Constance Haw for anoscopy/sigmoidoscopy once a year. - Follow-up with [place] in 6 months.  I plan to repeat CT scan in a year.   2.  Perianal pain: - She does not report any perianal pain at this time.   3.  Difficulty falling asleep: - Continue Xanax at bedtime as needed.   4.  Diabetes: - Continue Tresiba, Trulicity and NovoLog.   Orders placed this  encounter:  No orders of the defined types were placed in this encounter.    Derek Jack, MD [place] [phone]   I, Thana Ates, am acting as a scribe for Dr. Derek Jack.  I, Derek Jack MD, have reviewed the above documentation for accuracy and completeness, and I agree with the above.

## 2020-10-30 ENCOUNTER — Inpatient Hospital Stay (HOSPITAL_COMMUNITY): Payer: Medicare HMO

## 2020-10-30 ENCOUNTER — Encounter (HOSPITAL_COMMUNITY): Payer: Self-pay | Admitting: Hematology

## 2020-10-30 ENCOUNTER — Other Ambulatory Visit: Payer: Self-pay

## 2020-10-30 ENCOUNTER — Inpatient Hospital Stay (HOSPITAL_BASED_OUTPATIENT_CLINIC_OR_DEPARTMENT_OTHER): Payer: Medicare HMO | Admitting: Hematology

## 2020-10-30 VITALS — BP 99/70 | HR 77 | Temp 97.0°F | Resp 18 | Wt 177.0 lb

## 2020-10-30 DIAGNOSIS — E119 Type 2 diabetes mellitus without complications: Secondary | ICD-10-CM | POA: Diagnosis not present

## 2020-10-30 DIAGNOSIS — Z79899 Other long term (current) drug therapy: Secondary | ICD-10-CM | POA: Diagnosis not present

## 2020-10-30 DIAGNOSIS — Z8541 Personal history of malignant neoplasm of cervix uteri: Secondary | ICD-10-CM | POA: Diagnosis not present

## 2020-10-30 DIAGNOSIS — C210 Malignant neoplasm of anus, unspecified: Secondary | ICD-10-CM | POA: Diagnosis not present

## 2020-10-30 DIAGNOSIS — Z794 Long term (current) use of insulin: Secondary | ICD-10-CM | POA: Diagnosis not present

## 2020-10-30 DIAGNOSIS — E785 Hyperlipidemia, unspecified: Secondary | ICD-10-CM | POA: Diagnosis not present

## 2020-10-30 DIAGNOSIS — Z803 Family history of malignant neoplasm of breast: Secondary | ICD-10-CM | POA: Diagnosis not present

## 2020-10-30 DIAGNOSIS — I10 Essential (primary) hypertension: Secondary | ICD-10-CM | POA: Diagnosis not present

## 2020-10-30 DIAGNOSIS — Z452 Encounter for adjustment and management of vascular access device: Secondary | ICD-10-CM | POA: Diagnosis not present

## 2020-10-30 MED ORDER — SODIUM CHLORIDE 0.9% FLUSH
10.0000 mL | INTRAVENOUS | Status: DC | PRN
Start: 2020-10-30 — End: 2020-10-30
  Administered 2020-10-30: 10 mL via INTRAVENOUS

## 2020-10-30 MED ORDER — HEPARIN SOD (PORK) LOCK FLUSH 100 UNIT/ML IV SOLN
500.0000 [IU] | Freq: Once | INTRAVENOUS | Status: AC
Start: 2020-10-30 — End: 2020-10-30
  Administered 2020-10-30: 500 [IU] via INTRAVENOUS

## 2020-10-30 NOTE — Progress Notes (Signed)
Patients port flushed without difficulty.  Good blood return noted with no bruising or swelling noted at site.  Band aid applied.  VSS with discharge and left in satisfactory condition with no s/s of distress noted.   

## 2020-10-30 NOTE — Patient Instructions (Signed)
[facility] at [facility] Discharge Instructions  You were seen today by Dr. Katragadda. He went over your recent results. Dr. Katragadda will see you back in 6 months for labs and follow up.   Thank you for choosing [facility] at [facility] to provide your oncology and hematology care.  To afford each patient quality time with our provider, please arrive at least 15 minutes before your scheduled appointment time.   If you have a lab appointment with the Cancer Center please come in thru the Main Entrance and check in at the main information desk  You need to re-schedule your appointment should you arrive 10 or more minutes late.  We strive to give you quality time with our providers, and arriving late affects you and other patients whose appointments are after yours.  Also, if you no show three or more times for appointments you may be dismissed from the clinic at the providers discretion.     Again, thank you for choosing [facility].  Our hope is that these requests will decrease the amount of time that you wait before being seen by our physicians.       _____________________________________________________________  Should you have questions after your visit to [facility], please contact our office at [phone] between the hours of 8:00 a.m. and 4:30 p.m.  Voicemails left after 4:00 p.m. will not be returned until the following business day.  For prescription refill requests, have your pharmacy contact our office and allow 72 hours.    Cancer Center Support Programs:   > Cancer Support Group  2nd Tuesday of the month 1pm-2pm, Journey Room    

## 2020-11-06 ENCOUNTER — Encounter (INDEPENDENT_AMBULATORY_CARE_PROVIDER_SITE_OTHER): Payer: Self-pay | Admitting: *Deleted

## 2020-11-08 DIAGNOSIS — E113591 Type 2 diabetes mellitus with proliferative diabetic retinopathy without macular edema, right eye: Secondary | ICD-10-CM | POA: Diagnosis not present

## 2020-11-11 DIAGNOSIS — H338 Other retinal detachments: Secondary | ICD-10-CM | POA: Diagnosis not present

## 2020-11-11 DIAGNOSIS — H31093 Other chorioretinal scars, bilateral: Secondary | ICD-10-CM | POA: Diagnosis not present

## 2020-11-11 DIAGNOSIS — H3562 Retinal hemorrhage, left eye: Secondary | ICD-10-CM | POA: Diagnosis not present

## 2020-11-11 DIAGNOSIS — E113591 Type 2 diabetes mellitus with proliferative diabetic retinopathy without macular edema, right eye: Secondary | ICD-10-CM | POA: Diagnosis not present

## 2020-11-11 DIAGNOSIS — H34832 Tributary (branch) retinal vein occlusion, left eye, with macular edema: Secondary | ICD-10-CM | POA: Diagnosis not present

## 2020-11-11 DIAGNOSIS — H59812 Chorioretinal scars after surgery for detachment, left eye: Secondary | ICD-10-CM | POA: Diagnosis not present

## 2020-11-11 DIAGNOSIS — H59813 Chorioretinal scars after surgery for detachment, bilateral: Secondary | ICD-10-CM | POA: Diagnosis not present

## 2020-11-11 DIAGNOSIS — H31092 Other chorioretinal scars, left eye: Secondary | ICD-10-CM | POA: Diagnosis not present

## 2020-11-11 DIAGNOSIS — E113512 Type 2 diabetes mellitus with proliferative diabetic retinopathy with macular edema, left eye: Secondary | ICD-10-CM | POA: Diagnosis not present

## 2020-11-12 ENCOUNTER — Ambulatory Visit: Payer: Medicare HMO | Admitting: General Surgery

## 2020-11-26 DIAGNOSIS — E113512 Type 2 diabetes mellitus with proliferative diabetic retinopathy with macular edema, left eye: Secondary | ICD-10-CM | POA: Diagnosis not present

## 2020-11-27 ENCOUNTER — Other Ambulatory Visit: Payer: Self-pay

## 2020-11-27 ENCOUNTER — Encounter (HOSPITAL_BASED_OUTPATIENT_CLINIC_OR_DEPARTMENT_OTHER): Payer: Self-pay | Admitting: *Deleted

## 2020-11-27 ENCOUNTER — Emergency Department (HOSPITAL_BASED_OUTPATIENT_CLINIC_OR_DEPARTMENT_OTHER)
Admission: EM | Admit: 2020-11-27 | Discharge: 2020-11-27 | Disposition: A | Discharge status: Home / Self Care | Payer: Medicare HMO | Attending: Emergency Medicine | Admitting: Emergency Medicine

## 2020-11-27 ENCOUNTER — Emergency Department (HOSPITAL_BASED_OUTPATIENT_CLINIC_OR_DEPARTMENT_OTHER): Payer: Medicare HMO

## 2020-11-27 DIAGNOSIS — I5031 Acute diastolic (congestive) heart failure: Secondary | ICD-10-CM | POA: Diagnosis not present

## 2020-11-27 DIAGNOSIS — E119 Type 2 diabetes mellitus without complications: Secondary | ICD-10-CM | POA: Insufficient documentation

## 2020-11-27 DIAGNOSIS — Z794 Long term (current) use of insulin: Secondary | ICD-10-CM | POA: Diagnosis not present

## 2020-11-27 DIAGNOSIS — I82452 Acute embolism and thrombosis of left peroneal vein: Secondary | ICD-10-CM | POA: Diagnosis not present

## 2020-11-27 DIAGNOSIS — Z8541 Personal history of malignant neoplasm of cervix uteri: Secondary | ICD-10-CM | POA: Insufficient documentation

## 2020-11-27 DIAGNOSIS — Z7901 Long term (current) use of anticoagulants: Secondary | ICD-10-CM | POA: Insufficient documentation

## 2020-11-27 DIAGNOSIS — Z7984 Long term (current) use of oral hypoglycemic drugs: Secondary | ICD-10-CM | POA: Diagnosis not present

## 2020-11-27 DIAGNOSIS — Z85048 Personal history of other malignant neoplasm of rectum, rectosigmoid junction, and anus: Secondary | ICD-10-CM | POA: Diagnosis not present

## 2020-11-27 DIAGNOSIS — M79605 Pain in left leg: Secondary | ICD-10-CM | POA: Diagnosis present

## 2020-11-27 DIAGNOSIS — I110 Hypertensive heart disease with heart failure: Secondary | ICD-10-CM | POA: Insufficient documentation

## 2020-11-27 DIAGNOSIS — I82402 Acute embolism and thrombosis of unspecified deep veins of left lower extremity: Secondary | ICD-10-CM | POA: Diagnosis not present

## 2020-11-27 DIAGNOSIS — M7989 Other specified soft tissue disorders: Secondary | ICD-10-CM | POA: Diagnosis not present

## 2020-11-27 MED ORDER — APIXABAN (ELIQUIS) VTE STARTER PACK (10MG AND 5MG): ORAL_TABLET | ORAL | 0 refills | Status: DC

## 2020-11-27 MED ORDER — APIXABAN (ELIQUIS) EDUCATION KIT FOR DVT/PE PATIENTS: PACK | Freq: Once | Status: AC

## 2020-11-27 MED ORDER — APIXABAN 2.5 MG PO TABS
10.0000 mg | ORAL_TABLET | Freq: Once | ORAL | Status: AC
  Administered 2020-11-27: 10 mg via ORAL

## 2020-11-27 NOTE — ED Triage Notes (Signed)
Fell at his kitchen floor a week before last and noted that her left leg is swollen, tight and painful.

## 2020-11-27 NOTE — ED Notes (Signed)
ED Provider at bedside. 

## 2020-11-27 NOTE — Discharge Instructions (Addendum)
Please return for chest pain shortness of breath or if you feel you are going to pass out.

## 2020-11-27 NOTE — ED Provider Notes (Signed)
[facility] DEPT Provider Note   CSN: [id number] Arrival date & time: 11/27/20  1519     History Chief Complaint  Patient presents with   Leg Pain    Jacqueline Bird is a 70 y.o. female.  70 yo F with a chief complaints of left leg pain and swelling.  She has just noticed this recently when she fell.  Thinks it was swollen before that.  Is being fitted for a new orthotic and was unable to get it placed today and they commented on her swelling and told her she should come to the ED for evaluation.  She denies any chest pain or trouble breathing.  Has had a DVT in that leg before.  She also has had a recent break in the skin about 3 months ago and had some issues with wound healing.  Feels like it is a little bit red and painful along that area.  The history is provided by the patient.  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   Cervical cancer ([place])    Depression    Essential hypertension    History of cervical cancer    Mixed hyperlipidemia    Type 2 diabetes mellitus [facility])     Patient Active Problem List   Diagnosis Date Noted   Pleural effusion, right    Acute diastolic CHF (congestive heart failure) ([place]) 05/23/2019   Acute respiratory failure with hypoxia ([place]) 05/23/2019   Hypokalemia 05/23/2019   Essential hypertension 05/23/2019   Type 2 diabetes mellitus with other  specified complication ([place]) 56/38/9373   Anxiety 05/23/2019   CHF (congestive heart failure) ([place]) 05/21/2019   Anal cancer ([place]) 02/01/2019   Mass of anus 01/24/2019   Presbycusis of both ears 03/18/2017   Sudden hearing loss, left 03/18/2017    Past Surgical History:  Procedure Laterality Date   AIR/FLUID EXCHANGE Left 03/01/2020   Procedure: AIR/FLUID EXCHANGE;  Surgeon: Jalene Mullet, MD;  Location: [facility] [place];  Service: Ophthalmology;  Laterality: Left;   CATARACT EXTRACTION, BILATERAL Right 05/2017   CERVICAL CONE BIOPSY     HAND AMPUTATION Left    INJECTION OF SILICONE OIL Left 42/87/6811   Procedure: INJECTION OF SILICONE OIL;  Surgeon: Jalene Mullet, MD;  Location: [facility];  Service: Ophthalmology;  Laterality: Left;   PARS PLANA VITRECTOMY Left 03/01/2020   Procedure: PARS PLANA VITRECTOMY WITH 25 GAUGE;  Surgeon: Jalene Mullet, MD;  Location: [place];  Service: Ophthalmology;  Laterality: Left;   PHOTOCOAGULATION WITH LASER Left 03/01/2020   Procedure: PHOTOCOAGULATION WITH LASER;  Surgeon: Jalene Mullet, MD;  Location: [place] [facility];  Service: Ophthalmology;  Laterality: Left;   PORTACATH PLACEMENT Left 02/08/2019   Procedure: INSERTION PORT-A-CATH;  Surgeon: Virl Cagey, MD;  [facility];  Service: General;  Laterality: Left;   RECTAL BIOPSY  01/26/2019   Procedure: BIOPSY OF ANAL MASS;  Surgeon: Virl Cagey, MD;  Location: [facility];  Service: General;;   RECTAL BIOPSY N/A 11/13/2019   Procedure: BIOPSY ANAL AREA;  Surgeon: Virl Cagey, MD;  Location: [facility];  Service: General;  Laterality: N/A;   RECTAL EXAM UNDER ANESTHESIA N/A 01/26/2019   Procedure: RECTAL EXAM UNDER ANESTHESIA;  Surgeon: Virl Cagey, MD;  Location: [facility];  Service: General;  Laterality: N/A;   RECTAL EXAM UNDER ANESTHESIA N/A 11/13/2019   Procedure: RECTAL EXAM UNDER ANESTHESIA  WITH ANAL BIOPSY;  Surgeon: Virl Cagey, MD;  Location: [facility];  Service: General;  Laterality:  N/A;   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 03/01/2020   Procedure: REPAIR OF COMPLEX TRACTION RETINAL DETACHMENT;  Surgeon: Jalene Mullet, MD;  Location: [place];  Service: Ophthalmology;  Laterality: Left;   RETINAL DETACHMENT SURGERY Bilateral 03/11/2020     OB History     Gravida  2   Para  2   Term      Preterm      AB      Living         SAB      IAB      Ectopic      Multiple      Live Births              Family History  Problem Relation Age of Onset   Cataracts Mother    Glaucoma Mother    Heart disease Mother    Diabetes Father    Heart attack Father    Cataracts Sister    Lung disease Sister    Cataracts Brother    Diabetes Brother    Heart disease Brother    Macular degeneration Maternal Aunt    Diabetes Maternal Grandfather    Heart disease Sister    Diabetes Sister    Throat cancer Son    Breast cancer Niece    Breast cancer Niece    Amblyopia Neg Hx    Blindness Neg Hx    Retinal detachment Neg Hx    Strabismus Neg Hx    Retinitis pigmentosa Neg Hx     Social History   Tobacco Use   Smoking status: Never   Smokeless tobacco: Never  Vaping Use   Vaping Use: Never used  Substance Use Topics   Alcohol use: Not Currently   Drug use: Never    Home Medications Prior to Admission medications   Medication Sig Start Date End Date Taking? Authorizing Provider  ALPRAZolam Duanne Moron) 0.5 MG tablet TAKE 1 TABLET BY MOUTH AT BEDTIME AS NEEDED FOR ANXIETY / SLEEP 03/25/20  Yes Derek Jack, MD  APIXABAN Arne Cleveland) VTE STARTER PACK (10MG AND 5MG) Take as directed on package: start with two-38m tablets twice daily for 7 days. On day 8, switch to one-559mtablet twice daily. 11/27/20  Yes FlDeno EtienneDO  atorvastatin (LIPITOR) 80 MG tablet Take 80 mg by mouth every evening.  11/29/19  Yes [provider]  Cholecalciferol (VITAMIN D) 125 MCG (5000 UT) CAPS Take 10,000 Units by mouth daily.    Yes [provider]  FARXIGA 10 MG TABS tablet Take 10 mg by mouth daily. 02/01/20  Yes [provider]  gabapentin (NEURONTIN) 100 MG capsule Take 100-200 mg by mouth See admin instructions. Take 2 capsules (200 mg) by mouth in  the morning and take 1 capsule (181m) at night. 01/09/19  Yes [provider]  NOVOLOG FLEXPEN 100 UNIT/ML FlexPen Inject 4-6 Units into the skin in the morning, at noon, and at bedtime.  02/01/20  Yes [provider]  sertraline (ZOLOFT) 50 MG tablet Take 50 mg by mouth daily.  11/29/19  Yes [provider]  traZODone (DESYREL) 50 MG tablet Take 50 mg by mouth at bedtime. 08/28/19  Yes [provider]  TRESIBA FLEXTOUCH 200 UNIT/ML SOPN Inject 20 Units into the skin daily before breakfast. 05/27/19  Yes Memon, JJolaine Artist MD  vitamin B-12 (CYANOCOBALAMIN) 1000 MCG tablet Take 2,000 mcg by mouth daily.   Yes [provider]  acetaminophen (TYLENOL) 500 MG tablet Take 500-1,000 mg by mouth every 6 (six) hours as needed (for pain.).    [provider]  Melatonin 10 MG TABS Take 10 mg by mouth at bedtime as needed (sleep).    [provider]  TRULICITY 03.61MWE/3.1VQSOPN Inject 0.75 mg into the muscle every Sunday.  01/10/19   [provider]    Allergies    Sulfa antibiotics  Review of Systems   Review of Systems  Constitutional:  Negative for chills and fever.  HENT:  Negative for congestion and rhinorrhea.   Eyes:  Negative for redness and visual disturbance.  Respiratory:  Negative for shortness of breath and wheezing.   Cardiovascular:  Positive for leg swelling. Negative for chest pain and palpitations.  Gastrointestinal:  Negative for nausea and vomiting.  Genitourinary:  Negative for dysuria and urgency.  Musculoskeletal:  Negative for arthralgias and myalgias.  Skin:  Positive for color change. Negative for pallor and wound.  Neurological:  Negative for dizziness and headaches.   Physical Exam Updated  Vital Signs BP (!) 146/99 (BP Location: Right Arm)   Pulse 77   Temp 98.4 F (36.9 C) (Oral)   Resp 16   Ht _0  (1.626 m)   Wt 79.8 kg   SpO2 98%   BMI 30.21 kg/m   Physical Exam Vitals and nursing note reviewed.  Constitutional:      General: She is not in acute distress.    Appearance: She is well-developed. She is not diaphoretic.  HENT:     Head: Normocephalic and atraumatic.  Eyes:     Pupils: Pupils are equal, round, and reactive to light.  Cardiovascular:     Rate and Rhythm: Normal rate and regular rhythm.     Heart sounds: No murmur heard.   No friction rub. No gallop.  Pulmonary:     Effort: Pulmonary effort is normal.     Breath sounds: No wheezing or rales.  Abdominal:     General: There is no distension.     Palpations: Abdomen is soft.     Tenderness: There is no abdominal tenderness.  Musculoskeletal:        General: Swelling and tenderness present.     Cervical back: Normal range of motion and neck supple.     Comments: Left lower extremities edema.  Pulse and motor intact.  Patient with subjective decrease sensation to the lateral aspect of the dorsal aspect of the foot.  Skin:    General: Skin is warm and dry.  Neurological:     Mental Status: She is alert and oriented to person, place, and time.  Psychiatric:        Behavior: Behavior normal.    ED Results / Procedures / Treatments   Labs (  all labs ordered are listed, but only abnormal results are displayed) Labs Reviewed - No data to display  EKG None  Radiology US Venous Img Lower Unilateral Left (DVT)  Result Date: 11/27/2020 CLINICAL DATA:  70 year old female with a history of leg pain and swelling EXAM: LEFT LOWER EXTREMITY VENOUS DOPPLER ULTRASOUND TECHNIQUE: Gray-scale sonography with graded compression, as well as color Doppler and duplex ultrasound were performed to evaluate the lower extremity deep venous systems from the level of the common femoral vein and including the common  femoral, femoral, profunda femoral, popliteal and calf veins including the posterior tibial, peroneal and gastrocnemius veins when visible. The superficial great saphenous vein was also interrogated. Spectral Doppler was utilized to evaluate flow at rest and with distal augmentation maneuvers in the common femoral, femoral and popliteal veins. COMPARISON:  None. FINDINGS: Contralateral Common Femoral Vein: Respiratory phasicity is normal and symmetric with the symptomatic side. No evidence of thrombus. Normal compressibility. Common Femoral Vein: No evidence of thrombus. Normal compressibility, respiratory phasicity and response to augmentation. Saphenofemoral Junction: No evidence of thrombus. Normal compressibility and flow on color Doppler imaging. Profunda Femoral Vein: No evidence of thrombus. Normal compressibility and flow on color Doppler imaging. Femoral Vein: No evidence of thrombus. Normal compressibility, respiratory phasicity and response to augmentation. Popliteal Vein: No evidence of thrombus. Normal compressibility, respiratory phasicity and response to augmentation. Calf Veins: Posterior tibial vein patent and compressible with flow maintained. Peroneal vein with occlusive thrombus with loss of compressibility and flow. No extension into the popliteal vein. Superficial Great Saphenous Vein: No evidence of thrombus. Normal compressibility and flow on color Doppler imaging. Other Findings:  None. IMPRESSION: Sonographic survey is positive for distal DVT involving the left calf, peroneal vein, with occlusive DVT. Sonographic survey negative for proximal DVT of the left common femoral vein, femoral vein, popliteal vein. These results were called by telephone at the time of interpretation on 11/27/2020 at 4:27 pm Dr. Deno Etienne , Electronically Signed   By: Corrie Mckusick D.O.   On: 11/27/2020 16:28    Procedures Procedures   Medications Ordered in ED Medications  apixaban (ELIQUIS) tablet 10 mg (has  no administration in time range)  apixaban (ELIQUIS) Education Kit for DVT/PE patients (has no administration in time range)    ED Course  I have reviewed the triage vital signs and the nursing notes.  Pertinent labs & imaging results that were available during my care of the patient were reviewed by me and considered in my medical decision making (see chart for details).    MDM Rules/Calculators/A&P                          70 yo F with a chief complaints of left leg pain and swelling going on for at least a week.  Has a history of a DVT in that leg seen at urgent care and sent here for DVT study.  I received a call from the radiologist and we discussed the patient's DVT study.  Has an acute distal DVT.  As the patient has had a DVT in the past and has cancer will start on anticoagulation.  Have her follow-up with her family doctor and oncologist.  4:38 PM:  I have discussed the diagnosis/risks/treatment options with the patient and family and believe the pt to be eligible for discharge home to follow-up with PCP, oncology. We also discussed returning to the ED immediately if new or worsening sx occur. We  discussed the sx which are most concerning (e.g., sudden worsening pain, fever, inability to tolerate by mouth, syncope, chest pain, sob, dark stool, blood in stool) that necessitate immediate return. Medications administered to the patient during their visit and any new prescriptions provided to the patient are listed below.  Medications given during this visit Medications  apixaban (ELIQUIS) tablet 10 mg (has no administration in time range)  apixaban (ELIQUIS) Education Kit for DVT/PE patients (has no administration in time range)     The patient appears reasonably screen and/or stabilized for discharge and I doubt any other medical condition or other [facility] requiring further screening, evaluation, or treatment in the ED at this time prior to discharge.   Final Clinical Impression(s) /  ED Diagnoses Final diagnoses:  Acute deep vein thrombosis (DVT) of left peroneal vein ([place])    Rx / DC Orders ED Discharge Orders          Ordered    APIXABAN (ELIQUIS) VTE STARTER PACK (10MG AND 5MG)        11/27/20 1629             Deno Etienne, DO 11/27/20 1638

## 2020-11-28 ENCOUNTER — Other Ambulatory Visit (HOSPITAL_BASED_OUTPATIENT_CLINIC_OR_DEPARTMENT_OTHER): Payer: Self-pay

## 2020-11-28 ENCOUNTER — Encounter (HOSPITAL_COMMUNITY): Payer: Self-pay | Admitting: Hematology

## 2020-12-02 DIAGNOSIS — I82409 Acute embolism and thrombosis of unspecified deep veins of unspecified lower extremity: Secondary | ICD-10-CM | POA: Diagnosis not present

## 2020-12-03 ENCOUNTER — Ambulatory Visit: Payer: Medicare HMO | Admitting: General Surgery

## 2020-12-16 DIAGNOSIS — Z89212 Acquired absence of left upper limb below elbow: Secondary | ICD-10-CM | POA: Diagnosis not present

## 2020-12-20 DIAGNOSIS — E113591 Type 2 diabetes mellitus with proliferative diabetic retinopathy without macular edema, right eye: Secondary | ICD-10-CM | POA: Diagnosis not present

## 2020-12-20 DIAGNOSIS — H348321 Tributary (branch) retinal vein occlusion, left eye, with retinal neovascularization: Secondary | ICD-10-CM | POA: Diagnosis not present

## 2020-12-31 ENCOUNTER — Ambulatory Visit: Payer: Medicare HMO | Admitting: General Surgery

## 2020-12-31 DIAGNOSIS — E113512 Type 2 diabetes mellitus with proliferative diabetic retinopathy with macular edema, left eye: Secondary | ICD-10-CM | POA: Diagnosis not present

## 2021-01-14 DIAGNOSIS — H6123 Impacted cerumen, bilateral: Secondary | ICD-10-CM | POA: Diagnosis not present

## 2021-01-15 DIAGNOSIS — E7849 Other hyperlipidemia: Secondary | ICD-10-CM | POA: Diagnosis not present

## 2021-01-15 DIAGNOSIS — I10 Essential (primary) hypertension: Secondary | ICD-10-CM | POA: Diagnosis not present

## 2021-01-22 DIAGNOSIS — E113591 Type 2 diabetes mellitus with proliferative diabetic retinopathy without macular edema, right eye: Secondary | ICD-10-CM | POA: Diagnosis not present

## 2021-01-28 DIAGNOSIS — E782 Mixed hyperlipidemia: Secondary | ICD-10-CM | POA: Diagnosis not present

## 2021-01-28 DIAGNOSIS — E119 Type 2 diabetes mellitus without complications: Secondary | ICD-10-CM | POA: Diagnosis not present

## 2021-01-30 ENCOUNTER — Inpatient Hospital Stay (HOSPITAL_COMMUNITY): Payer: Medicare HMO | Attending: Hematology

## 2021-01-30 ENCOUNTER — Other Ambulatory Visit: Payer: Self-pay

## 2021-01-30 DIAGNOSIS — Z452 Encounter for adjustment and management of vascular access device: Secondary | ICD-10-CM | POA: Insufficient documentation

## 2021-01-30 DIAGNOSIS — Z85048 Personal history of other malignant neoplasm of rectum, rectosigmoid junction, and anus: Secondary | ICD-10-CM | POA: Diagnosis not present

## 2021-01-30 MED ORDER — HEPARIN SOD (PORK) LOCK FLUSH 100 UNIT/ML IV SOLN
500.0000 [IU] | Freq: Once | INTRAVENOUS | Status: AC
  Administered 2021-01-30: 500 [IU] via INTRAVENOUS

## 2021-01-30 MED ORDER — SODIUM CHLORIDE 0.9% FLUSH
10.0000 mL | Freq: Once | INTRAVENOUS | Status: AC
  Administered 2021-01-30: 10 mL via INTRAVENOUS

## 2021-01-30 NOTE — Patient Instructions (Signed)
[facility]  Discharge Instructions: °Thank you for choosing [facility] to provide your oncology and hematology care.  °If you have a lab appointment with the Cancer Center, please come in thru the Main Entrance and check in at the main information desk. ° °Wear comfortable clothing and clothing appropriate for easy access to any Portacath or PICC line.  ° °We strive to give you quality time with your provider. You may need to reschedule your appointment if you arrive late (15 or more minutes).  Arriving late affects you and other patients whose appointments are after yours.  Also, if you miss three or more appointments without notifying the office, you may be dismissed from the clinic at the provider’s discretion.    °  °For prescription refill requests, have your pharmacy contact our office and allow 72 hours for refills to be completed.   ° °Today you received the following chemotherapy and/or immunotherapy agents PORT flush    °  °To help prevent nausea and vomiting after your treatment, we encourage you to take your nausea medication as directed. ° °BELOW ARE SYMPTOMS THAT SHOULD BE REPORTED IMMEDIATELY: °*FEVER GREATER THAN 100.4 F (38 °C) OR HIGHER °*CHILLS OR SWEATING °*NAUSEA AND VOMITING THAT IS NOT CONTROLLED WITH YOUR NAUSEA MEDICATION °*UNUSUAL SHORTNESS OF BREATH °*UNUSUAL BRUISING OR BLEEDING °*URINARY PROBLEMS (pain or burning when urinating, or frequent urination) °*BOWEL PROBLEMS (unusual diarrhea, constipation, pain near the anus) °TENDERNESS IN MOUTH AND THROAT WITH OR WITHOUT PRESENCE OF ULCERS (sore throat, sores in mouth, or a toothache) °UNUSUAL RASH, SWELLING OR PAIN  °UNUSUAL VAGINAL DISCHARGE OR ITCHING  ° °Items with * indicate a potential emergency and should be followed up as soon as possible or go to the Emergency Department if any problems should occur. ° °Please show the CHEMOTHERAPY ALERT CARD or IMMUNOTHERAPY ALERT CARD at check-in to the Emergency  Department and triage nurse. ° °Should you have questions after your visit or need to cancel or reschedule your appointment, please contact [facility] [phone]  and follow the prompts.  Office hours are 8:00 a.m. to 4:30 p.m. Monday - Friday. Please note that voicemails left after 4:00 p.m. may not be returned until the following business day.  We are closed weekends and major holidays. You have access to a nurse at all times for urgent questions. Please call the main number to the clinic [phone] and follow the prompts. ° °For any non-urgent questions, you may also contact your provider using MyChart. We now offer e-Visits for anyone 18 and older to request care online for non-urgent symptoms. For details visit [email or web address]. °  °Also download the MyChart app! Go to the app store, search "MyChart", open the app, select [facility], and log in with your MyChart username and password. ° °Due to Covid, a mask is required upon entering the hospital/clinic. If you do not have a mask, one will be given to you upon arrival. For doctor visits, patients may have 1 support person aged 18 or older with them. For treatment visits, patients cannot have anyone with them due to current Covid guidelines and our immunocompromised population.  °

## 2021-01-30 NOTE — Progress Notes (Signed)
Patients port flushed without difficulty.  Good blood return noted with no bruising or swelling noted at site.  Band aid applied.  VSS with discharge and left in satisfactory condition with no s/s of distress noted.  Discharge from clinic ambulatory in stable condition.  Alert and oriented X 3.  Follow up with [facility] as scheduled.  °

## 2021-02-04 DIAGNOSIS — H524 Presbyopia: Secondary | ICD-10-CM | POA: Diagnosis not present

## 2021-02-04 DIAGNOSIS — H54414A Blindness right eye category 4, normal vision left eye: Secondary | ICD-10-CM | POA: Diagnosis not present

## 2021-02-05 DIAGNOSIS — E113512 Type 2 diabetes mellitus with proliferative diabetic retinopathy with macular edema, left eye: Secondary | ICD-10-CM | POA: Diagnosis not present

## 2021-02-06 DIAGNOSIS — E1165 Type 2 diabetes mellitus with hyperglycemia: Secondary | ICD-10-CM | POA: Diagnosis not present

## 2021-02-06 DIAGNOSIS — R69 Illness, unspecified: Secondary | ICD-10-CM | POA: Diagnosis not present

## 2021-02-06 DIAGNOSIS — I82409 Acute embolism and thrombosis of unspecified deep veins of unspecified lower extremity: Secondary | ICD-10-CM | POA: Diagnosis not present

## 2021-02-06 DIAGNOSIS — E559 Vitamin D deficiency, unspecified: Secondary | ICD-10-CM | POA: Diagnosis not present

## 2021-02-06 DIAGNOSIS — E785 Hyperlipidemia, unspecified: Secondary | ICD-10-CM | POA: Diagnosis not present

## 2021-02-06 DIAGNOSIS — E539 Vitamin B deficiency, unspecified: Secondary | ICD-10-CM | POA: Diagnosis not present

## 2021-02-06 DIAGNOSIS — I25119 Atherosclerotic heart disease of native coronary artery with unspecified angina pectoris: Secondary | ICD-10-CM | POA: Diagnosis not present

## 2021-02-06 DIAGNOSIS — G4700 Insomnia, unspecified: Secondary | ICD-10-CM | POA: Diagnosis not present

## 2021-02-06 DIAGNOSIS — D167 Benign neoplasm of ribs, sternum and clavicle: Secondary | ICD-10-CM | POA: Diagnosis not present

## 2021-02-06 DIAGNOSIS — C211 Malignant neoplasm of anal canal: Secondary | ICD-10-CM | POA: Diagnosis not present

## 2021-02-06 DIAGNOSIS — R600 Localized edema: Secondary | ICD-10-CM | POA: Diagnosis not present

## 2021-02-12 ENCOUNTER — Other Ambulatory Visit: Payer: Self-pay

## 2021-02-12 ENCOUNTER — Other Ambulatory Visit: Payer: Self-pay | Admitting: *Deleted

## 2021-02-12 ENCOUNTER — Ambulatory Visit (HOSPITAL_COMMUNITY)
Admission: RE | Admit: 2021-02-12 | Discharge: 2021-02-12 | Disposition: A | Discharge status: Home / Self Care | Payer: Medicare HMO | Source: Ambulatory Visit | Attending: Vascular Surgery | Admitting: Vascular Surgery

## 2021-02-12 DIAGNOSIS — M7989 Other specified soft tissue disorders: Secondary | ICD-10-CM | POA: Diagnosis not present

## 2021-02-12 DIAGNOSIS — I739 Peripheral vascular disease, unspecified: Secondary | ICD-10-CM

## 2021-02-14 ENCOUNTER — Encounter: Payer: Medicare HMO | Admitting: Vascular Surgery

## 2021-02-14 DIAGNOSIS — E7849 Other hyperlipidemia: Secondary | ICD-10-CM | POA: Diagnosis not present

## 2021-02-14 DIAGNOSIS — I10 Essential (primary) hypertension: Secondary | ICD-10-CM | POA: Diagnosis not present

## 2021-02-18 ENCOUNTER — Encounter: Payer: Medicare HMO | Admitting: Vascular Surgery

## 2021-02-20 DIAGNOSIS — E119 Type 2 diabetes mellitus without complications: Secondary | ICD-10-CM | POA: Diagnosis not present

## 2021-02-21 ENCOUNTER — Encounter: Payer: Self-pay | Admitting: Vascular Surgery

## 2021-02-21 ENCOUNTER — Ambulatory Visit (INDEPENDENT_AMBULATORY_CARE_PROVIDER_SITE_OTHER): Payer: Medicare HMO | Admitting: Vascular Surgery

## 2021-02-21 ENCOUNTER — Other Ambulatory Visit: Payer: Self-pay

## 2021-02-21 VITALS — BP 144/76 | HR 72 | Temp 98.0°F | Resp 20 | Ht 64.0 in | Wt 183.0 lb

## 2021-02-21 DIAGNOSIS — I82542 Chronic embolism and thrombosis of left tibial vein: Secondary | ICD-10-CM

## 2021-02-21 NOTE — Progress Notes (Signed)
Office Note     CC: History of left tibial DVT Requesting Provider:  Celene Squibb, MD  HPI: Jacqueline Bird is a 70 y.o. (1950/12/20) female who presents for her 75-month appointment and follow-up with history of left tibial DVT.  Jacqueline Bird has a complex medical history including previous traumatic amputation of the left hand in 1973.  During that hospital stay, Jacqueline Bird had deep venous thrombosis which was treated with warfarin.  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  Fortunately she is currently in remission.  Recently, in July of this year, she appreciated left lower extremity swelling with ultrasound demonstrating tibial DVT.  She was started on anticoagulation for 3 months.   Over the last 3 months, Jacqueline Bird has struggled with her weight and balance.  She blames most of this on her cancer treatments over the last year, but has appreciate limited improvement.  He continues to live independently, using a cane to ambulate.  She has not had a recorded fall in the last several months.    The pt is on a statin for cholesterol management.  The pt is not on a daily aspirin.   Other AC:  Eliquis The pt is on medication for hypertension.   The pt is not diabetic.   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   Cervical cancer ([place])    Depression    DVT (deep venous thrombosis) ([facility])    Essential hypertension    History of cervical cancer    Mixed hyperlipidemia    Type 2 diabetes mellitus ([place])     Past Surgical History:  Procedure Laterality Date   AIR/FLUID EXCHANGE Left 03/01/2020   Procedure: AIR/FLUID EXCHANGE;  Surgeon: Jalene Mullet, MD;  Location: [place];  Service: Ophthalmology;  Laterality: Left;   CATARACT EXTRACTION, BILATERAL Right 05/2017   CERVICAL CONE BIOPSY     HAND AMPUTATION  Left    INJECTION OF SILICONE OIL Left 29/93/7169   Procedure: INJECTION OF SILICONE OIL;  Surgeon: Jalene Mullet, MD;  Location: [place];  Service: Ophthalmology;  Laterality: Left;   PARS PLANA VITRECTOMY Left 03/01/2020   Procedure: PARS PLANA VITRECTOMY WITH 25 GAUGE;  Surgeon: Jalene Mullet, MD;  Location: [place];  Service: Ophthalmology;  Laterality: Left;   PHOTOCOAGULATION WITH LASER Left 03/01/2020   Procedure: PHOTOCOAGULATION WITH LASER;  Surgeon: Jalene Mullet, MD;  Location: [facility];  Service: Ophthalmology;  Laterality: Left;   PORTACATH PLACEMENT Left 02/08/2019   Procedure: INSERTION PORT-A-CATH;  Surgeon: Virl Cagey, MD;  [facility];  Service: General;  Laterality: Left;   RECTAL BIOPSY  01/26/2019   Procedure: BIOPSY OF ANAL MASS;  Surgeon: Virl Cagey, MD;  Location[facility];  Service: General;;   RECTAL BIOPSY N/A 11/13/2019   Procedure: BIOPSY ANAL AREA;  Surgeon: Virl Cagey, MD;  Location: [facility];  Service: General;  Laterality: N/A;   RECTAL EXAM UNDER ANESTHESIA N/A 01/26/2019   Procedure: RECTAL EXAM UNDER ANESTHESIA;  Surgeon: Virl Cagey, MD;  Location: [facility];  Service: General;  Laterality: N/A;   RECTAL EXAM UNDER ANESTHESIA N/A 11/13/2019   Procedure: RECTAL EXAM UNDER ANESTHESIA  WITH ANAL BIOPSY;  Surgeon: Virl Cagey, MD;  Location: [facility];  Service: General;  Laterality:  N/A;   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 03/01/2020   Procedure: REPAIR OF COMPLEX TRACTION RETINAL DETACHMENT;  Surgeon: Jalene Mullet, MD;  Location: [facility];  Service: Ophthalmology;  Laterality: Left;   RETINAL DETACHMENT SURGERY Bilateral 03/11/2020    Social History   Socioeconomic History   Marital status: Single    Spouse name: Not on file   Number of children: 2   Years of education: Not on file   Highest education level: Not on file  Occupational History   Occupation: retired  Tobacco Use   Smoking status: Never    Smokeless tobacco: Never  Vaping Use   Vaping Use: Never used  Substance and Sexual Activity   Alcohol use: Not Currently   Drug use: Never   Sexual activity: Not Currently  Other Topics Concern   Not on file  Social History Narrative   Pt is retired and currently engaged.   Social Determinants of Radio broadcast assistant Strain: Not on file  Food Insecurity: Not on file  Transportation Needs: Not on file  Physical Activity: Not on file  Stress: Not on file  Social Connections: Not on file  Intimate Partner Violence: Not on file    Family History  Problem Relation Age of Onset   Cataracts Mother    Glaucoma Mother    Heart disease Mother    Diabetes Father    Heart attack Father    Cataracts Sister    Lung disease Sister    Cataracts Brother    Diabetes Brother    Heart disease Brother    Macular degeneration Maternal Aunt    Diabetes Maternal Grandfather    Heart disease Sister    Diabetes Sister    Throat cancer Son    Breast cancer Niece    Breast cancer Niece    Amblyopia Neg Hx    Blindness Neg Hx    Retinal detachment Neg Hx    Strabismus Neg Hx    Retinitis pigmentosa Neg Hx     Current Outpatient Medications  Medication Sig Dispense Refill   acetaminophen (TYLENOL) 500 MG tablet Take 500-1,000 mg by mouth every 6 (six) hours as needed (for pain.).     ALPRAZolam (XANAX) 0.5 MG tablet TAKE 1 TABLET BY MOUTH AT BEDTIME AS NEEDED FOR ANXIETY / SLEEP 30 tablet 5   atorvastatin (LIPITOR) 80 MG tablet Take 80 mg by mouth every evening.      Cholecalciferol (VITAMIN D) 125 MCG (5000 UT) CAPS Take 10,000 Units by mouth daily.      ELIQUIS 5 MG TABS tablet Take 5 mg by mouth 2 (two) times daily.     FARXIGA 10 MG TABS tablet Take 10 mg by mouth daily.     furosemide (LASIX) 20 MG tablet Take 20 mg by mouth daily.     gabapentin (NEURONTIN) 100 MG capsule Take 100-200 mg by mouth See admin instructions. Take 2 capsules (200 mg) by mouth in the morning and  take 1 capsule (100mg ) at night.     Melatonin 10 MG TABS Take 10 mg by mouth at bedtime as needed (sleep).     NOVOLOG FLEXPEN 100 UNIT/ML FlexPen Inject 4-6 Units into the skin in the morning, at noon, and at bedtime.      sertraline (ZOLOFT) 50 MG tablet Take 50 mg by mouth daily.      traZODone (DESYREL) 50 MG tablet Take 50 mg by mouth at bedtime.     TRESIBA  FLEXTOUCH 200 UNIT/ML SOPN Inject 20 Units into the skin daily before breakfast.     TRULICITY 7.42 VZ/5.6LO SOPN Inject 0.75 mg into the muscle every Sunday.      vitamin B-12 (CYANOCOBALAMIN) 1000 MCG tablet Take 2,000 mcg by mouth daily.     No current facility-administered medications for this visit.   Facility-Administered Medications Ordered in Other Visits  Medication Dose Route Frequency Provider Last Rate Last Admin   heparin lock flush 100 unit/mL  500 Units Intracatheter Once PRN Derek Jack, MD       sodium chloride flush (NS) 0.9 % injection 10 mL  10 mL Intracatheter PRN Derek Jack, MD   10 mL at 03/03/19 1152    Allergies  Allergen Reactions   Sulfa Antibiotics Anaphylaxis and Swelling    Per pt, can use creams with sulfa in it     REVIEW OF SYSTEMS:   [X]  denotes positive finding, [ ]  denotes negative finding Cardiac  Comments:  Chest pain or chest pressure:    Shortness of breath upon exertion:    Short of breath when lying flat:    Irregular heart rhythm:        Vascular    Pain in calf, thigh, or hip brought on by ambulation:    Pain in feet at night that wakes you up from your sleep:     Blood clot in your veins:    Leg swelling:         Pulmonary    Oxygen at home:    Productive cough:     Wheezing:         Neurologic    Sudden weakness in arms or legs:     Sudden numbness in arms or legs:     Sudden onset of difficulty speaking or slurred speech:    Temporary loss of vision in one eye:     Problems with dizziness:         Gastrointestinal    Blood in stool:      Vomited blood:         Genitourinary    Burning when urinating:     Blood in urine:        Psychiatric    Major depression:         Hematologic    Bleeding problems:    Problems with blood clotting too easily:        Skin    Rashes or ulcers:        Constitutional    Fever or chills:      PHYSICAL EXAMINATION:  Vitals:   02/21/21 1105  BP: (!) 144/76  Pulse: 72  Resp: 20  Temp: 98 F (36.7 C)  SpO2: 97%  Weight: 183 lb (83 kg)  Height: 5\' 4"  (1.626 m)    General:  WDWN in NAD; vital signs documented above Gait: Not observed HENT: WNL, normocephalic Pulmonary: normal non-labored breathing , without Rales, rhonchi,  wheezing Cardiac: regular HR, Abdomen: soft, NT, no masses Skin: without rashes Vascular Exam/Pulses:  Right Left  Radial 2+ (normal) Wrist wrapped at amp site  Ulnar 2+ (normal) Wrist wrapped at amp site          DP 2+ (normal) 2+ (normal)  PT 1+ (weak) 1+ (weak)   Extremities: without ischemic changes, without Gangrene , without cellulitis; without open wounds;  Musculoskeletal: no muscle wasting or atrophy  Neurologic: A&O X 3;  No focal weakness or paresthesias are detected Psychiatric:  The  pt has Normal affect.   Non-Invasive Vascular Imaging:   Noninvasive vascular imaging was reviewed demonstrating no propagation of DVT into the popliteal system. Thrombus present and peroneal vein only    ASSESSMENT/PLAN:: 70 y.o. female presenting for 30-month follow-up appointment with the left tibial vein DVT.  This has been the patient's second provoked DVT, with the first being in 1973, at the time of her previous surgery.  She is currently in remission from anal cancer being that the patient has had 2 incidences of provoked DVT, I had a risk-benefit discussion with Zineb about continuing lifelong anticoagulation as the odds that she develops a DVT again are roughly 20% in 4 years.  We had an extensive conversation regarding this especially in  the setting of her needing a cane to ambulate as well as living by herself.  I told her my biggest fear is that she falls and has a massive bleed.  We both agreed that she would continue anticoagulation for the time being, and that she can stop should she have any more issues with dizziness or vertigo - Her last episode was over 5 months ago.  Recommend continued, lifelong anticoagulation, as long as the patient is safely ambulating.  Should she have any balance or gait concerns, recommend stopping anticoagulation immediately.   Broadus John, MD [facility] [phone]

## 2021-03-10 ENCOUNTER — Ambulatory Visit (INDEPENDENT_AMBULATORY_CARE_PROVIDER_SITE_OTHER): Payer: Medicare HMO | Admitting: Gastroenterology

## 2021-03-10 DIAGNOSIS — H04122 Dry eye syndrome of left lacrimal gland: Secondary | ICD-10-CM | POA: Diagnosis not present

## 2021-03-10 DIAGNOSIS — E113591 Type 2 diabetes mellitus with proliferative diabetic retinopathy without macular edema, right eye: Secondary | ICD-10-CM | POA: Diagnosis not present

## 2021-03-10 DIAGNOSIS — H5712 Ocular pain, left eye: Secondary | ICD-10-CM | POA: Diagnosis not present

## 2021-03-10 DIAGNOSIS — H20012 Primary iridocyclitis, left eye: Secondary | ICD-10-CM | POA: Diagnosis not present

## 2021-03-10 DIAGNOSIS — H53142 Visual discomfort, left eye: Secondary | ICD-10-CM | POA: Diagnosis not present

## 2021-03-17 DIAGNOSIS — E7849 Other hyperlipidemia: Secondary | ICD-10-CM | POA: Diagnosis not present

## 2021-03-17 DIAGNOSIS — I10 Essential (primary) hypertension: Secondary | ICD-10-CM | POA: Diagnosis not present

## 2021-03-18 ENCOUNTER — Ambulatory Visit (INDEPENDENT_AMBULATORY_CARE_PROVIDER_SITE_OTHER): Payer: Medicare HMO | Admitting: Gastroenterology

## 2021-03-18 NOTE — Progress Notes (Incomplete)
Referring Provider: Derek Jack, MD Primary Care Physician:  Celene Squibb, MD Primary GI Physician: newly established  No chief complaint on file.   HPI:   Jacqueline Bird is a 70 y.o. female with past medical history of ***  Patient presenting today as a new patient, referred by Dr. Delton Coombes for concerns of liver cirrhosis as noted on recent abdominal imaging.   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, left (Jacqueline Bird)    Anal cancer (Jacqueline Bird)    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   Cervical cancer ([facility])    Depression    DVT (deep venous thrombosis) ([facility])    Essential hypertension    History of cervical cancer    Mixed hyperlipidemia    Type 2 diabetes mellitus (Jacqueline Bird)     Past Surgical History:  Procedure Laterality Date   AIR/FLUID EXCHANGE Left 03/01/2020   Procedure: AIR/FLUID EXCHANGE;  Surgeon: Jalene Mullet, MD;  Location: [facility];  Service: Ophthalmology;  Laterality: Left;   CATARACT EXTRACTION, BILATERAL Right 05/2017   CERVICAL CONE BIOPSY     HAND AMPUTATION Left    INJECTION OF SILICONE OIL Left 40/02/2724   Procedure: INJECTION OF SILICONE OIL;  Surgeon: Jalene Mullet, MD;  Location: [facility];  Service: Ophthalmology;  Laterality: Left;   PARS PLANA VITRECTOMY Left 03/01/2020   Procedure: PARS PLANA VITRECTOMY WITH 25 GAUGE;  Surgeon: Jalene Mullet, MD;  Location: [facility];  Service: Ophthalmology;  Laterality:  Left;   PHOTOCOAGULATION WITH LASER Left 03/01/2020   Procedure: PHOTOCOAGULATION WITH LASER;  Surgeon: Jalene Mullet, MD;  Location: [facility];  Service: Ophthalmology;  Laterality: Left;   PORTACATH PLACEMENT Left 02/08/2019   Procedure: INSERTION PORT-A-CATH;  Surgeon: Virl Cagey, MD;  [facility];  Service: General;  Laterality: Left;   RECTAL BIOPSY  01/26/2019   Procedure: BIOPSY OF ANAL MASS;  Surgeon: Virl Cagey, MD;  [facility];  Service: General;;   RECTAL BIOPSY N/A 11/13/2019   Procedure: BIOPSY ANAL AREA;  Surgeon: Virl Cagey, MD;  Location[facility];  Service: General;  Laterality: N/A;   RECTAL EXAM UNDER ANESTHESIA N/A 01/26/2019   Procedure: RECTAL EXAM UNDER ANESTHESIA;  Surgeon: Virl Cagey, MD;  Location[facility];  Service: General;  Laterality: N/A;   RECTAL EXAM UNDER ANESTHESIA N/A 11/13/2019   Procedure: RECTAL EXAM UNDER ANESTHESIA  WITH ANAL BIOPSY;  Surgeon: Virl Cagey, MD;  Location[facility];  Service: General;  Laterality: N/A;   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 03/01/2020   Procedure: REPAIR OF COMPLEX TRACTION RETINAL DETACHMENT;  Surgeon: Jalene Mullet, MD;  Location: [facility];  Service: Ophthalmology;  Laterality: Left;   RETINAL DETACHMENT SURGERY Bilateral 03/11/2020    Current Outpatient Medications  Medication Sig Dispense Refill   acetaminophen (TYLENOL) 500 MG tablet Take 500-1,000 mg by mouth every 6 (six) hours as needed (for pain.).     ALPRAZolam (XANAX) 0.5 MG tablet TAKE 1 TABLET BY MOUTH AT BEDTIME AS  NEEDED FOR ANXIETY / SLEEP 30 tablet 5   atorvastatin (LIPITOR) 80 MG tablet Take 80 mg by mouth every evening.      Cholecalciferol (VITAMIN D) 125 MCG (5000 UT) CAPS Take 10,000 Units by mouth daily.      ELIQUIS 5 MG TABS tablet Take 5 mg by mouth 2 (two) times daily.     FARXIGA 10 MG TABS tablet Take 10 mg by mouth daily.     furosemide (LASIX) 20 MG tablet Take 20 mg by mouth daily.      gabapentin (NEURONTIN) 100 MG capsule Take 100-200 mg by mouth See admin instructions. Take 2 capsules (200 mg) by mouth in the morning and take 1 capsule (100mg ) at night.     Melatonin 10 MG TABS Take 10 mg by mouth at bedtime as needed (sleep).     NOVOLOG FLEXPEN 100 UNIT/ML FlexPen Inject 4-6 Units into the skin in the morning, at noon, and at bedtime.      sertraline (ZOLOFT) 50 MG tablet Take 50 mg by mouth daily.      traZODone (DESYREL) 50 MG tablet Take 50 mg by mouth at bedtime.     TRESIBA FLEXTOUCH 200 UNIT/ML SOPN Inject 20 Units into the skin daily before breakfast.     TRULICITY 5.85 ID/7.8EU SOPN Inject 0.75 mg into the muscle every Sunday.      vitamin B-12 (CYANOCOBALAMIN) 1000 MCG tablet Take 2,000 mcg by mouth daily.     No current facility-administered medications for this visit.   Facility-Administered Medications Ordered in Other Visits  Medication Dose Route Frequency Provider Last Rate Last Admin   heparin lock flush 100 unit/mL  500 Units Intracatheter Once PRN Derek Jack, MD       sodium chloride flush (NS) 0.9 % injection 10 mL  10 mL Intracatheter PRN Derek Jack, MD   10 mL at 03/03/19 1152    Allergies as of 03/18/2021 - Review Complete 02/21/2021  Allergen Reaction Noted   Sulfa antibiotics Anaphylaxis and Swelling 05/20/2017    Family History  Problem Relation Age of Onset   Cataracts Mother    Glaucoma Mother    Heart disease Mother    Diabetes Father    Heart attack Father    Cataracts Sister    Lung disease Sister    Cataracts Brother    Diabetes Brother    Heart disease Brother    Macular degeneration Maternal Aunt    Diabetes Maternal Grandfather    Heart disease Sister    Diabetes Sister    Throat cancer Son    Breast cancer Niece    Breast cancer Niece    Amblyopia Neg Hx    Blindness Neg Hx    Retinal detachment Neg Hx    Strabismus Neg Hx    Retinitis pigmentosa Neg Hx     Social History   Socioeconomic  History   Marital status: Single    Spouse name: Not on file   Number of children: 2   Years of education: Not on file   Highest education level: Not on file  Occupational History   Occupation: retired  Tobacco Use   Smoking status: Never   Smokeless tobacco: Never  Vaping Use   Vaping Use: Never used  Substance and Sexual Activity   Alcohol use: Not Currently   Drug use: Never   Sexual activity: Not Currently  Other Topics Concern   Not on file  Social History Narrative   Pt is  retired and currently engaged.   Social Determinants of Health   Financial Resource Strain: Not on file  Food Insecurity: Not on file  Transportation Needs: Not on file  Physical Activity: Not on file  Stress: Not on file  Social Connections: Not on file    Review of systems General: negative for malaise, night sweats, fever, chills, weight los Neck: Negative for lumps, goiter, pain and significant neck swelling Resp: Negative for cough, wheezing, dyspnea at rest CV: Negative for chest pain, leg swelling, palpitations, orthopnea GI: denies melena, hematochezia, nausea, vomiting, diarrhea, constipation, dysphagia, odyonophagia, early satiety or unintentional weight loss.  MSK: Negative for joint pain or swelling, back pain, and muscle pain. Derm: Negative for itching or rash Psych: Denies depression, anxiety, memory loss, confusion. No homicidal or suicidal ideation.  Heme: Negative for prolonged bleeding, bruising easily, and swollen nodes. Endocrine: Negative for cold or heat intolerance, polyuria, polydipsia and goiter. Neuro: negative for tremor, gait imbalance, syncope and seizures. The remainder of the review of systems is noncontributory.  Physical Exam: There were no vitals taken for this visit. General:   Alert and oriented. No distress noted. Pleasant and cooperative.  Head:  Normocephalic and atraumatic. Eyes:  Conjuctiva clear without scleral icterus. Mouth:  Oral mucosa pink and  moist. Good dentition. No lesions. Heart: Normal rate and rhythm, s1 and s2 heart sounds present.  Lungs: Clear lung sounds in all lobes. Respirations equal and unlabored. Abdomen:  +BS, soft, non-tender and non-distended. No rebound or guarding. No HSM or masses noted. Derm: No palmar erythema or jaundice Msk:  Symmetrical without gross deformities. Normal posture. Extremities:  Without edema. Neurologic:  Alert and  oriented x4 Psych:  Alert and cooperative. Normal mood and affect.  Invalid input(s): 6 MONTHS   ASSESSMENT: Jacqueline Bird is a 70 y.o. female presenting today    PLAN:  -CMP, INR, AFP, RUQ [place] -- Reduce salt intake to <2 g per day - Can take Tylenol max of 2 g per day (650 mg q8h) for pain - Avoid NSAIDs for pain - Avoid eating raw oysters/shellfish - Ensure every night before going to sleep    Follow Up: ***  Estephani Popper L. Alver Sorrow, MSN, APRN, AGNP-C Adult-Gerontology Nurse Practitioner [facility]

## 2021-03-19 DIAGNOSIS — E113512 Type 2 diabetes mellitus with proliferative diabetic retinopathy with macular edema, left eye: Secondary | ICD-10-CM | POA: Diagnosis not present

## 2021-03-22 DIAGNOSIS — E119 Type 2 diabetes mellitus without complications: Secondary | ICD-10-CM | POA: Diagnosis not present

## 2021-03-24 ENCOUNTER — Ambulatory Visit (INDEPENDENT_AMBULATORY_CARE_PROVIDER_SITE_OTHER): Payer: Medicare HMO | Admitting: Gastroenterology

## 2021-04-02 DIAGNOSIS — H3582 Retinal ischemia: Secondary | ICD-10-CM | POA: Diagnosis not present

## 2021-04-02 DIAGNOSIS — E113591 Type 2 diabetes mellitus with proliferative diabetic retinopathy without macular edema, right eye: Secondary | ICD-10-CM | POA: Diagnosis not present

## 2021-04-02 DIAGNOSIS — E113512 Type 2 diabetes mellitus with proliferative diabetic retinopathy with macular edema, left eye: Secondary | ICD-10-CM | POA: Diagnosis not present

## 2021-04-02 DIAGNOSIS — H59813 Chorioretinal scars after surgery for detachment, bilateral: Secondary | ICD-10-CM | POA: Diagnosis not present

## 2021-04-02 DIAGNOSIS — H35373 Puckering of macula, bilateral: Secondary | ICD-10-CM | POA: Diagnosis not present

## 2021-04-16 DIAGNOSIS — I10 Essential (primary) hypertension: Secondary | ICD-10-CM | POA: Diagnosis not present

## 2021-04-16 DIAGNOSIS — E7849 Other hyperlipidemia: Secondary | ICD-10-CM | POA: Diagnosis not present

## 2021-04-19 DIAGNOSIS — R0981 Nasal congestion: Secondary | ICD-10-CM | POA: Diagnosis not present

## 2021-04-19 DIAGNOSIS — Z89119 Acquired absence of unspecified hand: Secondary | ICD-10-CM | POA: Diagnosis not present

## 2021-04-21 DIAGNOSIS — E119 Type 2 diabetes mellitus without complications: Secondary | ICD-10-CM | POA: Diagnosis not present

## 2021-04-21 DIAGNOSIS — E113591 Type 2 diabetes mellitus with proliferative diabetic retinopathy without macular edema, right eye: Secondary | ICD-10-CM | POA: Diagnosis not present

## 2021-04-24 ENCOUNTER — Inpatient Hospital Stay (HOSPITAL_COMMUNITY): Payer: Medicare HMO

## 2021-05-01 ENCOUNTER — Ambulatory Visit (HOSPITAL_COMMUNITY): Payer: Medicare HMO | Admitting: Hematology

## 2021-05-01 ENCOUNTER — Encounter (HOSPITAL_COMMUNITY): Payer: Medicare HMO

## 2021-05-01 ENCOUNTER — Inpatient Hospital Stay (HOSPITAL_COMMUNITY): Payer: Medicare HMO

## 2021-05-06 NOTE — Progress Notes (Signed)
Jacqueline Bird, [place] [id number]   CLINIC:  Medical Oncology/Hematology  PCP:  Celene Squibb, MD [address] Liana Crocker [place] [address] [phone]   REASON FOR VISIT:  Follow-up for anal cancer  PRIOR THERAPY: 5-FU & mitomycin from 03/03/2019 to 03/31/2019  NGS Results: not done  CURRENT THERAPY: surveillance  BRIEF ONCOLOGIC HISTORY:  Oncology History  Anal cancer ([facility])  02/01/2019 Initial Diagnosis   Anal cancer ([facility])   02/16/2019 Cancer Staging   Staging form: Anus, AJCC 8th Edition - Clinical: Stage IIIC (cT3, cN1c, cM0) - Signed by Kyung Rudd, MD on 02/16/2019    03/03/2019 -  Chemotherapy   The patient had mitoMYcin (MUTAMYCIN) chemo injection 20 mg, 9.7 mg/m2 = 21 mg, Intravenous,  Once, 1 of 1 cycle Dose modification: 8 mg/m2 (original dose 10 mg/m2, Cycle 1, Reason: Provider Judgment) Administration: 20 mg (03/03/2019), 16.5 mg (03/31/2019) fluorouracil (ADRUCIL) 8,300 mg in sodium chloride 0.9 % 84 mL chemo infusion, 1,000 mg/m2/day = 8,300 mg, Intravenous, 4D (96 hours ), 1 of 1 cycle Administration: 8,300 mg (03/03/2019), 8,300 mg (03/31/2019)   for chemotherapy treatment.       CANCER STAGING:  Cancer Staging  Anal cancer [facility]) Staging form: Anus, AJCC 8th Edition - Clinical: Stage IIIC (cT3, cN1c, cM0) - Signed by Kyung Rudd, MD on 02/16/2019   INTERVAL HISTORY:  Ms. Geralynn Capri, a 70 y.o. female, returns for routine follow-up of her anal cancer. Tawania was last seen on 10/30/2020.   Today she reports feeling good, and she is accompanied by her cousin. She reports a recent DVT in her left leg. She denies being bed ridden prior to the DVT. 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 She denies perianal pain.  She is taking Eliquis and tolerating it well.   REVIEW OF SYSTEMS:  Review of Systems   Constitutional:  Negative for appetite change (80%) and fatigue (80%).  Musculoskeletal:  Positive for myalgias (4/10 L leg).  Psychiatric/Behavioral:  The patient is nervous/anxious.   All other systems reviewed and are negative.  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   Cervical cancer ([facility])    Depression    DVT (deep venous thrombosis) ([facility])    Essential hypertension    History of cervical cancer    Mixed hyperlipidemia    Type 2 diabetes mellitus ([facility])    Past Surgical History:  Procedure Laterality Date   AIR/FLUID EXCHANGE Left 03/01/2020   Procedure: AIR/FLUID EXCHANGE;  Surgeon: Jalene Mullet, MD;  Location: [facility];  Service: Ophthalmology;  Laterality: Left;   CATARACT EXTRACTION, BILATERAL Right 05/2017   CERVICAL CONE BIOPSY     HAND AMPUTATION Left    INJECTION OF SILICONE OIL Left 84/69/6295   Procedure: INJECTION OF SILICONE OIL;  Surgeon: Jalene Mullet, MD;  Location: [facility];  Service: Ophthalmology;  Laterality: Left;   PARS PLANA VITRECTOMY Left 03/01/2020   Procedure: PARS PLANA VITRECTOMY WITH 25 GAUGE;  Surgeon: Jalene Mullet, MD;  Location: [facility];  Service: Ophthalmology;  Laterality: Left;   PHOTOCOAGULATION WITH LASER Left 03/01/2020   Procedure: PHOTOCOAGULATION WITH LASER;  Surgeon: Jalene Mullet, MD;  Location: [facility];  Service: Ophthalmology;  Laterality: Left;   PORTACATH PLACEMENT Left 02/08/2019   Procedure: INSERTION PORT-A-CATH;  Surgeon: Virl Cagey, MD;  Location: [facility];  Service: General;  Laterality: Left;   RECTAL BIOPSY  01/26/2019   Procedure: BIOPSY OF ANAL MASS;  Surgeon: Virl Cagey, MD;  Location: [facility];  Service: General;;   RECTAL BIOPSY N/A 11/13/2019   Procedure: BIOPSY ANAL AREA;  Surgeon: Virl Cagey, MD;  Location: [facility];  Service: General;  Laterality: N/A;    RECTAL EXAM UNDER ANESTHESIA N/A 01/26/2019   Procedure: RECTAL EXAM UNDER ANESTHESIA;  Surgeon: Virl Cagey, MD;  Location: [facility];  Service: General;  Laterality: N/A;   RECTAL EXAM UNDER ANESTHESIA N/A 11/13/2019   Procedure: RECTAL EXAM UNDER ANESTHESIA  WITH ANAL BIOPSY;  Surgeon: Virl Cagey, MD;  Location: [facility];  Service: General;  Laterality: N/A;   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 03/01/2020   Procedure: REPAIR OF COMPLEX TRACTION RETINAL DETACHMENT;  Surgeon: Jalene Mullet, MD;  Location: [facility];  Service: Ophthalmology;  Laterality: Left;   RETINAL DETACHMENT SURGERY Bilateral 03/11/2020    SOCIAL HISTORY:  Social History   Socioeconomic History   Marital status: Single    Spouse name: Not on file   Number of children: 2   Years of education: Not on file   Highest education level: Not on file  Occupational History   Occupation: retired  Tobacco Use   Smoking status: Never   Smokeless tobacco: Never  Vaping Use   Vaping Use: Never used  Substance and Sexual Activity   Alcohol use: Not Currently   Drug use: Never   Sexual activity: Not Currently  Other Topics Concern   Not on file  Social History Narrative   Pt is retired and currently engaged.   Social Determinants of Health   Financial Resource Strain: Not on file  Food Insecurity: Not on file  Transportation Needs: Not on file  Physical Activity: Not on file  Stress: Not on file  Social Connections: Not on file  Intimate Partner Violence: Not on file    FAMILY HISTORY:  Family History  Problem Relation Age of Onset   Cataracts Mother    Glaucoma Mother    Heart disease Mother    Diabetes Father    Heart attack Father    Cataracts Sister    Lung disease Sister    Cataracts Brother    Diabetes Brother    Heart disease Brother    Macular degeneration Maternal Aunt    Diabetes Maternal Grandfather    Heart disease Sister    Diabetes Sister    Throat cancer Son     Breast cancer Niece    Breast cancer Niece    Amblyopia Neg Hx    Blindness Neg Hx    Retinal detachment Neg Hx    Strabismus Neg Hx    Retinitis pigmentosa Neg Hx     CURRENT MEDICATIONS:  Current Outpatient Medications  Medication Sig Dispense Refill   atorvastatin (LIPITOR) 80 MG tablet Take 80 mg by mouth every evening.      Cholecalciferol (VITAMIN D) 125 MCG (5000 UT) CAPS Take 10,000 Units by mouth daily.      ELIQUIS 5 MG TABS tablet Take 5 mg by mouth 2 (two) times daily.     FARXIGA 10 MG TABS tablet Take 10 mg by mouth daily.     furosemide (LASIX) 20 MG tablet Take 20 mg by  mouth daily.     gabapentin (NEURONTIN) 100 MG capsule Take 100-200 mg by mouth See admin instructions. Take 2 capsules (200 mg) by mouth in the morning and take 1 capsule (100mg ) at night.     NOVOLOG FLEXPEN 100 UNIT/ML FlexPen Inject 4-6 Units into the skin in the morning, at noon, and at bedtime.      prednisoLONE acetate (PRED FORTE) 1 % ophthalmic suspension Place into the left eye.     sertraline (ZOLOFT) 50 MG tablet Take 50 mg by mouth daily.      traZODone (DESYREL) 50 MG tablet Take 50 mg by mouth at bedtime.     TRESIBA FLEXTOUCH 200 UNIT/ML SOPN Inject 20 Units into the skin daily before breakfast.     TRULICITY 9.38 BO/1.7PZ SOPN Inject 0.75 mg into the muscle every Sunday.      vitamin B-12 (CYANOCOBALAMIN) 1000 MCG tablet Take 2,000 mcg by mouth daily.     acetaminophen (TYLENOL) 500 MG tablet Take 500-1,000 mg by mouth every 6 (six) hours as needed (for pain.). (Patient not taking: Reported on 05/07/2021)     ALPRAZolam (XANAX) 0.5 MG tablet TAKE 1 TABLET BY MOUTH AT BEDTIME AS NEEDED FOR ANXIETY / SLEEP (Patient not taking: Reported on 05/07/2021) 30 tablet 5   Melatonin 10 MG TABS Take 10 mg by mouth at bedtime as needed (sleep). (Patient not taking: Reported on 05/07/2021)     No current facility-administered medications for this visit.   Facility-Administered Medications Ordered in  Other Visits  Medication Dose Route Frequency Provider Last Rate Last Admin   heparin lock flush 100 unit/mL  500 Units Intracatheter Once PRN Derek Jack, MD       heparin lock flush 100 unit/mL  500 Units Intravenous Once Derek Jack, MD       influenza vaccine adjuvanted (FLUAD) injection 0.5 mL  0.5 mL Intramuscular Once Derek Jack, MD       sodium chloride flush (NS) 0.9 % injection 10 mL  10 mL Intracatheter PRN Derek Jack, MD   10 mL at 03/03/19 1152   sodium chloride flush (NS) 0.9 % injection 10 mL  10 mL Intravenous Once Derek Jack, MD        ALLERGIES:  Allergies  Allergen Reactions   Sulfa Antibiotics Anaphylaxis and Swelling    Per pt, can use creams with sulfa in it    PHYSICAL EXAM:  Performance status (ECOG): 1 - Symptomatic but completely ambulatory  There were no vitals filed for this visit. Wt Readings from Last 3 Encounters:  02/21/21 183 lb (83 kg)  11/27/20 176 lb (79.8 kg)  10/30/20 177 lb (80.3 kg)   Physical Exam Vitals reviewed.  Constitutional:      Appearance: Normal appearance.  Cardiovascular:     Rate and Rhythm: Normal rate and regular rhythm.     Pulses: Normal pulses.     Heart sounds: Normal heart sounds.  Pulmonary:     Effort: Pulmonary effort is normal.     Breath sounds: Normal breath sounds.  Genitourinary:    Rectum: Normal. No mass or tenderness.  Skin:    Findings: No lesion.  Neurological:     General: No focal deficit present.     Mental Status: She is alert and oriented to person, place, and time.  Psychiatric:        Mood and Affect: Mood normal.        Behavior: Behavior normal.     LABORATORY DATA:  I  have reviewed the labs as listed.  CBC Latest Ref Rng & Units 05/07/2021 10/28/2020 03/21/2020  WBC 4.0 - 10.5 K/uL 5.0 5.8 7.5  Hemoglobin 12.0 - 15.0 g/dL 14.0 14.1 14.2  Hematocrit 36.0 - 46.0 % 44.1 42.9 42.9  Platelets 150 - 400 K/uL 218 223 209   CMP Latest Ref Rng  & Units 05/07/2021 10/28/2020 10/25/2020  Glucose 70 - 99 mg/dL 122(H) 145(H) -  BUN 8 - 23 mg/dL 16 15 -  Creatinine 0.44 - 1.00 mg/dL 0.59 0.54 0.70  Sodium 135 - 145 mmol/L 141 137 -  Potassium 3.5 - 5.1 mmol/L 3.7 4.3 -  Chloride 98 - 111 mmol/L 104 104 -  CO2 22 - 32 mmol/L 31 27 -  Calcium 8.9 - 10.3 mg/dL 9.3 8.9 -  Total Protein 6.5 - 8.1 g/dL 7.3 7.1 -  Total Bilirubin 0.3 - 1.2 mg/dL 0.5 0.5 -  Alkaline Phos 38 - 126 U/L 58 66 -  AST 15 - 41 U/L 20 18 -  ALT 0 - 44 U/L 19 19 -    DIAGNOSTIC IMAGING:  I have independently reviewed the scans and discussed with the patient. No results found.   ASSESSMENT:  1.  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 -PET scan on 10/23/2019 showed no specific worrisome recurrent hypermetabolic anal activity. Maximum SUV in the anal region was 4.1, previously 4.3. No recurrent pathologically enlarged or hypermetabolic inguinal or external iliac lymph nodes. No metastatic spread. -Biopsy of the anal ulcer consistent with benign inflamed squamous mucosa with ulceration. -CTAP on 03/21/2020 with no acute process or evidence of metastatic disease in the abdomen or pelvis.  Lower rectal wall thickening is mild, most likely treatment related.  Mild cirrhosis.   PLAN:  1.  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 - Last CT [facility] with contrast from 10/25/2020 did not show any evidence of local recurrence or metastatic disease.  Previously demonstrated mild rectal wall thickening has improved.  Incidental cirrhosis was found. - Digital rectal exam today showed no suspicious lesions in the perianal region.  No masses felt in the anal canal. - We will make a referral to GI for colonoscopy and new diagnosis of cirrhosis. - RTC 6 months for follow-up with repeat labs, CT abdomen and pelvis with  contrast.   2.  Unprovoked left leg DVT: - She was diagnosed with distal DVT involving left calf, peroneal vein. - DVT was negative for proximal veins. - Lower extremity Doppler on 02/12/2021 showed chronic thrombus involving small saphenous vein.  Popliteal vein is not competent. - She is tolerating Eliquis without any major problems. - She reportedly had blood clot in the left forearm which traveled to her lungs and was treated with Coumadin for 18 months in 1973. - Based on recurrent DVT and unprovoked nature, I have recommended indefinite anticoagulation.   3.  Difficulty falling asleep: - Continue Xanax at bedtime as needed.       Orders placed this encounter:  No orders of the defined types were placed in this encounter.    Derek Jack, MD [facility] [phone]   I, Thana Ates, am acting as a scribe for Dr. Derek Jack.  I, Derek Jack MD, have reviewed the above documentation for accuracy and completeness, and I agree with the above.

## 2021-05-07 ENCOUNTER — Inpatient Hospital Stay (HOSPITAL_COMMUNITY): Payer: Medicare HMO

## 2021-05-07 ENCOUNTER — Inpatient Hospital Stay (HOSPITAL_COMMUNITY): Payer: Medicare HMO | Attending: Hematology

## 2021-05-07 ENCOUNTER — Inpatient Hospital Stay (HOSPITAL_BASED_OUTPATIENT_CLINIC_OR_DEPARTMENT_OTHER): Payer: Medicare HMO | Admitting: Hematology

## 2021-05-07 ENCOUNTER — Other Ambulatory Visit: Payer: Self-pay

## 2021-05-07 ENCOUNTER — Encounter (HOSPITAL_COMMUNITY): Payer: Self-pay

## 2021-05-07 DIAGNOSIS — Z23 Encounter for immunization: Secondary | ICD-10-CM | POA: Insufficient documentation

## 2021-05-07 DIAGNOSIS — R450 Nervousness: Secondary | ICD-10-CM | POA: Diagnosis not present

## 2021-05-07 DIAGNOSIS — C210 Malignant neoplasm of anus, unspecified: Secondary | ICD-10-CM

## 2021-05-07 DIAGNOSIS — Z882 Allergy status to sulfonamides status: Secondary | ICD-10-CM | POA: Diagnosis not present

## 2021-05-07 DIAGNOSIS — M791 Myalgia, unspecified site: Secondary | ICD-10-CM | POA: Diagnosis not present

## 2021-05-07 DIAGNOSIS — Z836 Family history of other diseases of the respiratory system: Secondary | ICD-10-CM | POA: Insufficient documentation

## 2021-05-07 DIAGNOSIS — Z8249 Family history of ischemic heart disease and other diseases of the circulatory system: Secondary | ICD-10-CM | POA: Diagnosis not present

## 2021-05-07 DIAGNOSIS — I824Z2 Acute embolism and thrombosis of unspecified deep veins of left distal lower extremity: Secondary | ICD-10-CM | POA: Diagnosis not present

## 2021-05-07 DIAGNOSIS — Z79899 Other long term (current) drug therapy: Secondary | ICD-10-CM | POA: Insufficient documentation

## 2021-05-07 DIAGNOSIS — Z83518 Family history of other specified eye disorder: Secondary | ICD-10-CM | POA: Insufficient documentation

## 2021-05-07 DIAGNOSIS — I82559 Chronic embolism and thrombosis of unspecified peroneal vein: Secondary | ICD-10-CM | POA: Insufficient documentation

## 2021-05-07 DIAGNOSIS — K626 Ulcer of anus and rectum: Secondary | ICD-10-CM | POA: Insufficient documentation

## 2021-05-07 DIAGNOSIS — Z83511 Family history of glaucoma: Secondary | ICD-10-CM | POA: Diagnosis not present

## 2021-05-07 DIAGNOSIS — Z833 Family history of diabetes mellitus: Secondary | ICD-10-CM | POA: Diagnosis not present

## 2021-05-07 DIAGNOSIS — Z8541 Personal history of malignant neoplasm of cervix uteri: Secondary | ICD-10-CM | POA: Diagnosis not present

## 2021-05-07 DIAGNOSIS — Z7901 Long term (current) use of anticoagulants: Secondary | ICD-10-CM | POA: Insufficient documentation

## 2021-05-07 DIAGNOSIS — Z803 Family history of malignant neoplasm of breast: Secondary | ICD-10-CM | POA: Insufficient documentation

## 2021-05-07 DIAGNOSIS — K7460 Unspecified cirrhosis of liver: Secondary | ICD-10-CM | POA: Insufficient documentation

## 2021-05-07 DIAGNOSIS — R69 Illness, unspecified: Secondary | ICD-10-CM | POA: Diagnosis not present

## 2021-05-07 DIAGNOSIS — Z808 Family history of malignant neoplasm of other organs or systems: Secondary | ICD-10-CM | POA: Insufficient documentation

## 2021-05-07 LAB — COMPREHENSIVE METABOLIC PANEL
ALT: 19 U/L (ref 0–44)
AST: 20 U/L (ref 15–41)
Albumin: 3.7 g/dL (ref 3.5–5.0)
Alkaline Phosphatase: 58 U/L (ref 38–126)
Anion gap: 6 (ref 5–15)
BUN: 16 mg/dL (ref 8–23)
CO2: 31 mmol/L (ref 22–32)
Calcium: 9.3 mg/dL (ref 8.9–10.3)
Chloride: 104 mmol/L (ref 98–111)
Creatinine, Ser: 0.59 mg/dL (ref 0.44–1.00)
GFR, Estimated: >60 mL/min (ref >60)
Glucose, Bld: 122 mg/dL — ABNORMAL HIGH (ref 70–99)
Potassium: 3.7 mmol/L (ref 3.5–5.1)
Sodium: 141 mmol/L (ref 135–145)
Total Bilirubin: 0.5 mg/dL (ref 0.3–1.2)
Total Protein: 7.3 g/dL (ref 6.5–8.1)

## 2021-05-07 LAB — CBC WITH DIFFERENTIAL/PLATELET
Abs Immature Granulocytes: 0.01 10*3/uL (ref 0.00–0.07)
Basophils Absolute: 0 10*3/uL (ref 0.0–0.1)
Basophils Relative: 1 %
Eosinophils Absolute: 0.1 10*3/uL (ref 0.0–0.5)
Eosinophils Relative: 2 %
HCT: 44.1 % (ref 36.0–46.0)
Hemoglobin: 14 g/dL (ref 12.0–15.0)
Immature Granulocytes: 0 %
Lymphocytes Relative: 19 %
Lymphs Abs: 1 10*3/uL (ref 0.7–4.0)
MCH: 30.5 pg (ref 26.0–34.0)
MCHC: 31.7 g/dL (ref 30.0–36.0)
MCV: 96.1 fL (ref 80.0–100.0)
Monocytes Absolute: 0.5 10*3/uL (ref 0.1–1.0)
Monocytes Relative: 10 %
Neutro Abs: 3.4 10*3/uL (ref 1.7–7.7)
Neutrophils Relative %: 68 %
Platelets: 218 10*3/uL (ref 150–400)
RBC: 4.59 MIL/uL (ref 3.87–5.11)
RDW: 14.3 % (ref 11.5–15.5)
WBC: 5 10*3/uL (ref 4.0–10.5)
nRBC: 0 % (ref 0.0–0.2)

## 2021-05-07 LAB — MAGNESIUM: Magnesium: 2 mg/dL (ref 1.7–2.4)

## 2021-05-07 MED ORDER — SODIUM CHLORIDE 0.9% FLUSH
10.0000 mL | Freq: Once | INTRAVENOUS | Status: AC
  Administered 2021-05-07: 15:00:00 10 mL via INTRAVENOUS

## 2021-05-07 MED ORDER — INFLUENZA VAC A&B SA ADJ QUAD 0.5 ML IM PRSY
0.5000 mL | PREFILLED_SYRINGE | Freq: Once | INTRAMUSCULAR | Status: DC
  Filled 2021-05-07: qty 0.5

## 2021-05-07 MED ORDER — INFLUENZA VAC A&B SA ADJ QUAD 0.5 ML IM PRSY
0.5000 mL | PREFILLED_SYRINGE | Freq: Once | INTRAMUSCULAR | Status: AC
Start: 2021-05-07 — End: 2021-05-07
  Administered 2021-05-07: 15:00:00 0.5 mL via INTRAMUSCULAR

## 2021-05-07 MED ORDER — HEPARIN SOD (PORK) LOCK FLUSH 100 UNIT/ML IV SOLN
500.0000 [IU] | Freq: Once | INTRAVENOUS | Status: AC
  Administered 2021-05-07: 15:00:00 500 [IU] via INTRAVENOUS

## 2021-05-07 NOTE — Patient Instructions (Signed)
[facility]  Discharge Instructions: Thank you for choosing [facility] to provide your oncology and hematology care.  If you have a lab appointment with the Cancer Center, please come in thru the Main Entrance and check in at the main information desk.  Wear comfortable clothing and clothing appropriate for easy access to any Portacath or PICC line.   We strive to give you quality time with your provider. You may need to reschedule your appointment if you arrive late (15 or more minutes).  Arriving late affects you and other patients whose appointments are after yours.  Also, if you miss three or more appointments without notifying the office, you may be dismissed from the clinic at the provider's discretion.      For prescription refill requests, have your pharmacy contact our office and allow 72 hours for refills to be completed.        To help prevent nausea and vomiting after your treatment, we encourage you to take your nausea medication as directed.  BELOW ARE SYMPTOMS THAT SHOULD BE REPORTED IMMEDIATELY: *FEVER GREATER THAN 100.4 F (38 C) OR HIGHER *CHILLS OR SWEATING *NAUSEA AND VOMITING THAT IS NOT CONTROLLED WITH YOUR NAUSEA MEDICATION *UNUSUAL SHORTNESS OF BREATH *UNUSUAL BRUISING OR BLEEDING *URINARY PROBLEMS (pain or burning when urinating, or frequent urination) *BOWEL PROBLEMS (unusual diarrhea, constipation, pain near the anus) TENDERNESS IN MOUTH AND THROAT WITH OR WITHOUT PRESENCE OF ULCERS (sore throat, sores in mouth, or a toothache) UNUSUAL RASH, SWELLING OR PAIN  UNUSUAL VAGINAL DISCHARGE OR ITCHING   Items with * indicate a potential emergency and should be followed up as soon as possible or go to the Emergency Department if any problems should occur.  Please show the CHEMOTHERAPY ALERT CARD or IMMUNOTHERAPY ALERT CARD at check-in to the Emergency Department and triage nurse.  Should you have questions after your visit or need to cancel  or reschedule your appointment, please contact [facility] [phone]  and follow the prompts.  Office hours are 8:00 a.m. to 4:30 p.m. Monday - Friday. Please note that voicemails left after 4:00 p.m. may not be returned until the following business day.  We are closed weekends and major holidays. You have access to a nurse at all times for urgent questions. Please call the main number to the clinic [phone] and follow the prompts.  For any non-urgent questions, you may also contact your provider using MyChart. We now offer e-Visits for anyone 18 and older to request care online for non-urgent symptoms. For details visit [email or web address].   Also download the MyChart app! Go to the app store, search "MyChart", open the app, select [facility], and log in with your MyChart username and password.  Due to Covid, a mask is required upon entering the hospital/clinic. If you do not have a mask, one will be given to you upon arrival. For doctor visits, patients may have 1 support person aged 18 or older with them. For treatment visits, patients cannot have anyone with them due to current Covid guidelines and our immunocompromised population.  

## 2021-05-07 NOTE — Patient Instructions (Signed)
Jacqueline Bird at [facility] Discharge Instructions  You were seen and examined today by Dr. Delton Coombes. He reviewed your most recent labs and everything looks good. Continue taking Eliquis as prescribed. Please keep follow up appointment as scheduled in 6 months.   Thank you for choosing [facility] to provide your oncology and hematology care.  To afford each patient quality time with our provider, please arrive at least 15 minutes before your scheduled appointment time.   If you have a lab appointment with the [facility] please come in thru the Main Entrance and check in at the main information desk.  You need to re-schedule your appointment should you arrive 10 or more minutes late.  We strive to give you quality time with our providers, and arriving late affects you and other patients whose appointments are after yours.  Also, if you no show three or more times for appointments you may be dismissed from the clinic at the providers discretion.     Again, thank you for choosing [facility].  Our hope is that these requests will decrease the amount of time that you wait before being seen by our physicians.       _____________________________________________________________  Should you have questions after your visit to [facility], please contact our office at [phone] and follow the prompts.  Our office hours are 8:00 a.m. and 4:30 p.m. Monday - Friday.  Please note that voicemails left after 4:00 p.m. may not be returned until the following business day.  We are closed weekends and major holidays.  You do have access to a nurse 24-7, just call the main number to the clinic [phone] and do not press any options, hold on the line and a nurse will answer the phone.    For prescription refill requests, have your pharmacy contact our office and allow 72 hours.    Due to Covid, you will need to wear a mask  upon entering the hospital. If you do not have a mask, a mask will be given to you at the Main Entrance upon arrival. For doctor visits, patients may have 1 support person age 4 or older with them. For treatment visits, patients can not have anyone with them due to social distancing guidelines and our immunocompromised population.

## 2021-05-07 NOTE — Patient Instructions (Signed)
Patient received flu shot today 

## 2021-05-07 NOTE — Progress Notes (Signed)
Mrs. Bayon in for office visit today. Flu vaccine ordered. Please see MAR for injection information. Patient remained stable throughout injection. Patient discharged from clinic ambulatory and in stable condition.

## 2021-05-08 ENCOUNTER — Encounter (INDEPENDENT_AMBULATORY_CARE_PROVIDER_SITE_OTHER): Payer: Self-pay | Admitting: *Deleted

## 2021-05-16 DIAGNOSIS — E7849 Other hyperlipidemia: Secondary | ICD-10-CM | POA: Diagnosis not present

## 2021-05-16 DIAGNOSIS — I10 Essential (primary) hypertension: Secondary | ICD-10-CM | POA: Diagnosis not present

## 2021-05-21 DIAGNOSIS — E119 Type 2 diabetes mellitus without complications: Secondary | ICD-10-CM | POA: Diagnosis not present

## 2021-06-09 DIAGNOSIS — E113591 Type 2 diabetes mellitus with proliferative diabetic retinopathy without macular edema, right eye: Secondary | ICD-10-CM | POA: Diagnosis not present

## 2021-06-17 DIAGNOSIS — I10 Essential (primary) hypertension: Secondary | ICD-10-CM | POA: Diagnosis not present

## 2021-06-17 DIAGNOSIS — E7849 Other hyperlipidemia: Secondary | ICD-10-CM | POA: Diagnosis not present

## 2021-06-20 DIAGNOSIS — E119 Type 2 diabetes mellitus without complications: Secondary | ICD-10-CM | POA: Diagnosis not present

## 2021-06-23 DIAGNOSIS — E113512 Type 2 diabetes mellitus with proliferative diabetic retinopathy with macular edema, left eye: Secondary | ICD-10-CM | POA: Diagnosis not present

## 2021-06-23 DIAGNOSIS — H338 Other retinal detachments: Secondary | ICD-10-CM | POA: Diagnosis not present

## 2021-06-23 DIAGNOSIS — H59813 Chorioretinal scars after surgery for detachment, bilateral: Secondary | ICD-10-CM | POA: Diagnosis not present

## 2021-06-23 DIAGNOSIS — E113591 Type 2 diabetes mellitus with proliferative diabetic retinopathy without macular edema, right eye: Secondary | ICD-10-CM | POA: Diagnosis not present

## 2021-06-23 DIAGNOSIS — H3582 Retinal ischemia: Secondary | ICD-10-CM | POA: Diagnosis not present

## 2021-06-24 ENCOUNTER — Other Ambulatory Visit: Payer: Self-pay

## 2021-06-24 ENCOUNTER — Ambulatory Visit (INDEPENDENT_AMBULATORY_CARE_PROVIDER_SITE_OTHER): Payer: Medicare HMO | Admitting: Gastroenterology

## 2021-06-24 ENCOUNTER — Encounter (INDEPENDENT_AMBULATORY_CARE_PROVIDER_SITE_OTHER): Payer: Self-pay | Admitting: Gastroenterology

## 2021-06-24 VITALS — BP 142/83 | HR 76 | Temp 98.5°F | Ht 64.5 in | Wt 184.7 lb

## 2021-06-24 DIAGNOSIS — C210 Malignant neoplasm of anus, unspecified: Secondary | ICD-10-CM | POA: Diagnosis not present

## 2021-06-24 DIAGNOSIS — K7460 Unspecified cirrhosis of liver: Secondary | ICD-10-CM | POA: Diagnosis not present

## 2021-06-24 NOTE — Patient Instructions (Addendum)
We will get you scheduled for colonoscopy Given recent findings of cirrhosis, it would be important for [place] to do ultrasound and dedicate labs for further evaluation of this. I understand that cost of all your recent medical interventions needs to be considered,  please contact your insurance in regards to coverage of the following orders, as I do not want to order these yet and add to your financial burden.  RUQ/Liver Ultrasound AFP tumor marker INR Complete metabolic panel CBC Acute hepatitis panel  (Diagnosis would be cirrhosis of the liver in case they ask for this)  There are other labs that should be done but these are the basic and most important for initial evaluation.   Please let me know when you are able to find out about coverage/cost for these so that we can discuss proceeding with ordering them.   Follow up 6 months

## 2021-06-24 NOTE — Progress Notes (Signed)
Referring Provider: Derek Jack, MD Primary Care Physician:  Celene Squibb, MD Primary GI Physician: new  Chief Complaint  Patient presents with   History of Anal Cancer    Patient here today for follow up on her history of anal cancer. Patient denies any Gi symptoms.   HPI:   Jacqueline Bird is a 71 y.o. 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   Patient presenting today as a new patient for anal cancer and cirrhosis.   Anal Cancer: followed by Dr. Delton Coombes. Diagnosed in September 2020, stage IIIc. Chemotherapy started in oct 2020-nov 2020. Recent CT and previous PET scan without suspicion for metastasis, though there was mild rectal wall thickening noted that appeared to have improved on last CT A/P with contrast in June 2022. She reports that she is finished with treatment. She is doing well and has no GI complaints. She states that when cancer was discovered she thought she had a yeast infection, she went in for further evaluation and it was discovered that she had anal cancer. She has never had a colonoscopy. She denies constipation, diarrhea, rectal bleeding or melena. She denies any issues with weight loss. Appetite is stable. No abdominal pain, nausea, or vomiting. No changes in bowel habits.  Cirrhosis: newly diagnosed, CT A/P with contrast June 2022 with findings suspicious for mild hepatic cirrhosis, no focal liver lesions. Previous CT in nov 2021 with caudate lobe enlargement. Notably, LFTs have been WNL, with AST only slightly higher than ALT (20 and 19), last reviewable labs Dec 2022. Denies any issues with swelling to her belly, confusion, pruritus or jaundice. No current or previous alcohol or illicit drug use.   NSAID TML:[id number] ibuprofen Social hx: no etoh or tobacco Fam hx: no CRC or liver disease.   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, left ([place])    Anal cancer ([place])    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   Cervical cancer ([facility])    Depression    DVT (deep venous thrombosis) ([facility])    Essential hypertension    History of cervical cancer    Mixed hyperlipidemia    Type 2 diabetes mellitus [facility])     Past Surgical History:  Procedure Laterality Date   AIR/FLUID EXCHANGE Left 03/01/2020   Procedure: AIR/FLUID EXCHANGE;  Surgeon: Jalene Mullet, MD;  Location: [place];  Service: Ophthalmology;  Laterality: Left;   CATARACT EXTRACTION, BILATERAL Right 05/2017   CERVICAL CONE BIOPSY     HAND AMPUTATION Left    INJECTION OF SILICONE OIL Left 81/27/5170   Procedure: INJECTION OF SILICONE OIL;  Surgeon: Jalene Mullet, MD;  Location: [place];  Service: Ophthalmology;  Laterality: Left;   PARS PLANA VITRECTOMY Left 03/01/2020   Procedure: PARS PLANA VITRECTOMY WITH 25 GAUGE;  Surgeon: Jalene Mullet, MD;  Location: [place];  Service: Ophthalmology;  Laterality: Left;   PHOTOCOAGULATION WITH LASER Left 03/01/2020   Procedure: PHOTOCOAGULATION WITH LASER;  Surgeon: Jalene Mullet, MD;  Location: [place];  Service: Ophthalmology;  Laterality: Left;   PORTACATH PLACEMENT Left 02/08/2019   Procedure: INSERTION PORT-A-CATH;  Surgeon: Virl Cagey, MD;  Location: [facility];  Service: General;  Laterality: Left;   RECTAL BIOPSY  01/26/2019   Procedure: BIOPSY OF ANAL MASS;  Surgeon: Virl Cagey, MD;  Location: [facility];  Service:  General;;   RECTAL BIOPSY N/A 11/13/2019   Procedure: BIOPSY ANAL AREA;  Surgeon: Virl Cagey, MD;  Location: [facility];  Service: General;  Laterality: N/A;   RECTAL EXAM UNDER ANESTHESIA N/A 01/26/2019   Procedure: RECTAL EXAM UNDER ANESTHESIA;  Surgeon: Virl Cagey, MD;  Location: [facility];  Service: General;  Laterality: N/A;   RECTAL EXAM UNDER ANESTHESIA N/A 11/13/2019   Procedure: RECTAL EXAM UNDER ANESTHESIA  WITH ANAL BIOPSY;   Surgeon: Virl Cagey, MD;  Location: [facility];  Service: General;  Laterality: N/A;   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 03/01/2020   Procedure: REPAIR OF COMPLEX TRACTION RETINAL DETACHMENT;  Surgeon: Jalene Mullet, MD;  Location: [place];  Service: Ophthalmology;  Laterality: Left;   RETINAL DETACHMENT SURGERY Bilateral 03/11/2020    Current Outpatient Medications  Medication Sig Dispense Refill   acetaminophen (TYLENOL) 500 MG tablet Take 500-1,000 mg by mouth every 6 (six) hours as needed (for pain.).     ALPRAZolam (XANAX) 0.5 MG tablet TAKE 1 TABLET BY MOUTH AT BEDTIME AS NEEDED FOR ANXIETY / SLEEP 30 tablet 5   atorvastatin (LIPITOR) 80 MG tablet Take 80 mg by mouth every evening.      Cholecalciferol (VITAMIN D) 125 MCG (5000 UT) CAPS Take 5,000 Units by mouth daily.     ELIQUIS 5 MG TABS tablet Take 5 mg by mouth 2 (two) times daily.     FARXIGA 10 MG TABS tablet Take 10 mg by mouth daily.     furosemide (LASIX) 20 MG tablet Take 20 mg by mouth daily.     gabapentin (NEURONTIN) 100 MG capsule Take 100-200 mg by mouth See admin instructions. Take 2 capsules (200 mg) by mouth in the morning and take 1 capsule (100mg ) at night.     NOVOLOG FLEXPEN 100 UNIT/ML FlexPen Inject 4-6 Units into the skin in the morning, at noon, and at bedtime.      sertraline (ZOLOFT) 50 MG tablet Take 50 mg by mouth daily.      traZODone (DESYREL) 50 MG tablet Take 50 mg by mouth at bedtime.     TRESIBA FLEXTOUCH 200 UNIT/ML SOPN Inject 20 Units into the skin daily before breakfast. (Patient taking differently: Inject 26 Units into the skin daily before breakfast.)     TRULICITY 6.21 HY/8.6VH SOPN Inject 0.75 mg into the muscle every Sunday.      vitamin B-12 (CYANOCOBALAMIN) 1000 MCG tablet Take 2,000 mcg by mouth daily. (Patient not taking: Reported on 06/24/2021)     No current facility-administered medications for this visit.   Facility-Administered Medications Ordered in Other  Visits  Medication Dose Route Frequency Provider Last Rate Last Admin   heparin lock flush 100 unit/mL  500 Units Intracatheter Once PRN Derek Jack, MD       sodium chloride flush (NS) 0.9 % injection 10 mL  10 mL Intracatheter PRN Derek Jack, MD   10 mL at 03/03/19 1152    Allergies as of 06/24/2021 - Review Complete 06/24/2021  Allergen Reaction Noted   Sulfa antibiotics Anaphylaxis and Swelling 05/20/2017    Family History  Problem Relation Age of Onset   Cataracts Mother    Glaucoma Mother    Heart disease Mother    Diabetes Father    Heart attack Father    Cataracts Sister    Lung disease Sister    Cataracts Brother    Diabetes Brother    Heart disease Brother    Macular  degeneration Maternal Aunt    Diabetes Maternal Grandfather    Heart disease Sister    Diabetes Sister    Throat cancer Son    Breast cancer Niece    Breast cancer Niece    Amblyopia Neg Hx    Blindness Neg Hx    Retinal detachment Neg Hx    Strabismus Neg Hx    Retinitis pigmentosa Neg Hx     Social History   Socioeconomic History   Marital status: Single    Spouse name: Not on file   Number of children: 2   Years of education: Not on file   Highest education level: Not on file  Occupational History   Occupation: retired  Tobacco Use   Smoking status: Never   Smokeless tobacco: Never  Vaping Use   Vaping Use: Never used  Substance and Sexual Activity   Alcohol use: Not Currently   Drug use: Never   Sexual activity: Not Currently  Other Topics Concern   Not on file  Social History Narrative   Pt is retired and currently engaged.   Social Determinants of Health   Financial Resource Strain: Not on file  Food Insecurity: Not on file  Transportation Needs: Not on file  Physical Activity: Not on file  Stress: Not on file  Social Connections: Not on file   Review of systems General: negative for malaise, night sweats, fever, chills, weight loss Neck: Negative  for lumps, goiter, pain and significant neck swelling Resp: Negative for cough, wheezing, dyspnea at rest CV: Negative for chest pain, leg swelling, palpitations, orthopnea GI: denies melena, hematochezia, nausea, vomiting, diarrhea, constipation, dysphagia, odyonophagia, early satiety or unintentional weight loss.  MSK: Negative for joint pain or swelling, back pain, and muscle pain. Derm: Negative for itching or rash Psych: Denies depression, anxiety, memory loss, confusion. No homicidal or suicidal ideation.  Heme: Negative for prolonged bleeding, bruising easily, and swollen nodes. Endocrine: Negative for cold or heat intolerance, polyuria, polydipsia and goiter. Neuro: negative for tremor, gait imbalance, syncope and seizures. The remainder of the review of systems is noncontributory.  Physical Exam: BP (!) 142/83 (BP Location: Left Arm, Patient Position: Sitting, Cuff Size: Large)    Pulse 76    Temp 98.5 F (36.9 C) (Oral)    Ht 5' 4.5" (1.638 m)    Wt 184 lb 11.2 oz (83.8 kg)    BMI 31.21 kg/m  General:   Alert and oriented. No distress noted. Pleasant and cooperative.  Head:  Normocephalic and atraumatic. Eyes:  Conjuctiva clear without scleral icterus. Mouth:  Oral mucosa pink and moist. Good dentition. No lesions. Heart: Normal rate and rhythm, s1 and s2 heart sounds present.  Lungs: Clear lung sounds in all lobes. Respirations equal and unlabored. Abdomen:  +BS, soft, non-tender and non-distended. No rebound or guarding. No HSM or masses noted. Derm: No palmar erythema or jaundice Msk:  Symmetrical without gross deformities. Normal posture. Extremities:  Without edema. Neurologic:  Alert and  oriented x4 Psych:  Alert and cooperative. Normal mood and affect.  Invalid input(s): 6 MONTHS   ASSESSMENT: Sundae Maners is a 71 y.o. female presenting today as a new patient for initial screening colonoscopy with hx of anal cancer, as well as new diagnosis of hepatic cirrhosis.    Patient diagnosed with squamous cell carcinoma of the anus in Oct 2020, completed chemotherapy nov 2020 with previous CT and PET scans without evidence of any metastasis/recurrence. Patient states she is doing well. She has  no current GI complaints. Has never had a colonoscopy before. Given her hx and lack of any previous CRC screening, we will get her set up for colonoscopy. Indications, risks and benefits of procedure discussed in detail with patient. Patient verbalized understanding and is in agreement to proceed with Colonoscopy at this time.   In regards to new findings of hepatic Cirrhosis on CT imaging last year, she reports she has never been told she had cirrhosis previously, though notably on CT of Abdomen in nov 2021 there was evidence of caudate lobe enlargment, further indicating cirrhosis. I explained the indications of cirrhosis and importance of routine 6 month [place] and labs as cirrhosis increases the risks of [place]. With initial diagnosis, it is important to have dedicated liver imagining as well as acute hepatitis and AIH serologies to rule out other underlying causes of liver disease, besides NASH. Pt verbalized understanding of this, however, she tells me that due to her previous cancer treatment she is still working to pay off a lot of medical bills and is very concerned about accruing anymore at this time. I advised patient that I would write down the most imperative initial cirrhosis labs/diagnostics so that she could call her insurance to see if these would be covered and if not, how much out of pocket expense these would be for her. I discussed that it is important to check AIH and Acute Hep serologies as well, however, these can be delayed if necessary. Patient will let me know after she contacts her insurance and is ready to proceed with MELD labs, RUQ [place] and AFP.   PLAN:  Schedule colonoscopy 2. US liver elastography 3. CMP, INR, AFP, CBC 4. Acute Hep panel +AIH serologies **will  hold off on ordering [place] and cirrhosis labs/workup for now as patient has issues with financial burden due to recent cancer treatments, she will contact her insurance to see what labs are covered and let me know when she can proceed based on out of pocket cost.   Follow Up: 6 months   Christyna Letendre L. Alver Sorrow, MSN, APRN, AGNP-C Adult-Gerontology Nurse Practitioner [facility]

## 2021-07-07 DIAGNOSIS — E113512 Type 2 diabetes mellitus with proliferative diabetic retinopathy with macular edema, left eye: Secondary | ICD-10-CM | POA: Diagnosis not present

## 2021-07-15 DIAGNOSIS — I10 Essential (primary) hypertension: Secondary | ICD-10-CM | POA: Diagnosis not present

## 2021-07-15 DIAGNOSIS — E7849 Other hyperlipidemia: Secondary | ICD-10-CM | POA: Diagnosis not present

## 2021-07-20 DIAGNOSIS — E119 Type 2 diabetes mellitus without complications: Secondary | ICD-10-CM | POA: Diagnosis not present

## 2021-07-28 DIAGNOSIS — E113591 Type 2 diabetes mellitus with proliferative diabetic retinopathy without macular edema, right eye: Secondary | ICD-10-CM | POA: Diagnosis not present

## 2021-08-04 DIAGNOSIS — E539 Vitamin B deficiency, unspecified: Secondary | ICD-10-CM | POA: Diagnosis not present

## 2021-08-04 DIAGNOSIS — R350 Frequency of micturition: Secondary | ICD-10-CM | POA: Diagnosis not present

## 2021-08-04 DIAGNOSIS — E119 Type 2 diabetes mellitus without complications: Secondary | ICD-10-CM | POA: Diagnosis not present

## 2021-08-04 DIAGNOSIS — E538 Deficiency of other specified B group vitamins: Secondary | ICD-10-CM | POA: Diagnosis not present

## 2021-08-04 DIAGNOSIS — E559 Vitamin D deficiency, unspecified: Secondary | ICD-10-CM | POA: Diagnosis not present

## 2021-08-07 DIAGNOSIS — K7460 Unspecified cirrhosis of liver: Secondary | ICD-10-CM | POA: Diagnosis not present

## 2021-08-07 DIAGNOSIS — I82409 Acute embolism and thrombosis of unspecified deep veins of unspecified lower extremity: Secondary | ICD-10-CM | POA: Diagnosis not present

## 2021-08-07 DIAGNOSIS — I25119 Atherosclerotic heart disease of native coronary artery with unspecified angina pectoris: Secondary | ICD-10-CM | POA: Diagnosis not present

## 2021-08-07 DIAGNOSIS — E539 Vitamin B deficiency, unspecified: Secondary | ICD-10-CM | POA: Diagnosis not present

## 2021-08-07 DIAGNOSIS — R69 Illness, unspecified: Secondary | ICD-10-CM | POA: Diagnosis not present

## 2021-08-07 DIAGNOSIS — E559 Vitamin D deficiency, unspecified: Secondary | ICD-10-CM | POA: Diagnosis not present

## 2021-08-07 DIAGNOSIS — R809 Proteinuria, unspecified: Secondary | ICD-10-CM | POA: Diagnosis not present

## 2021-08-07 DIAGNOSIS — E785 Hyperlipidemia, unspecified: Secondary | ICD-10-CM | POA: Diagnosis not present

## 2021-08-07 DIAGNOSIS — G4700 Insomnia, unspecified: Secondary | ICD-10-CM | POA: Diagnosis not present

## 2021-08-07 DIAGNOSIS — C211 Malignant neoplasm of anal canal: Secondary | ICD-10-CM | POA: Diagnosis not present

## 2021-08-07 DIAGNOSIS — E1165 Type 2 diabetes mellitus with hyperglycemia: Secondary | ICD-10-CM | POA: Diagnosis not present

## 2021-08-08 ENCOUNTER — Inpatient Hospital Stay (HOSPITAL_COMMUNITY): Payer: Medicare HMO | Attending: Hematology

## 2021-08-19 DIAGNOSIS — E119 Type 2 diabetes mellitus without complications: Secondary | ICD-10-CM | POA: Diagnosis not present

## 2021-09-14 DIAGNOSIS — E782 Mixed hyperlipidemia: Secondary | ICD-10-CM | POA: Diagnosis not present

## 2021-09-14 DIAGNOSIS — I25119 Atherosclerotic heart disease of native coronary artery with unspecified angina pectoris: Secondary | ICD-10-CM | POA: Diagnosis not present

## 2021-09-14 DIAGNOSIS — I5032 Chronic diastolic (congestive) heart failure: Secondary | ICD-10-CM | POA: Diagnosis not present

## 2021-09-14 DIAGNOSIS — E1165 Type 2 diabetes mellitus with hyperglycemia: Secondary | ICD-10-CM | POA: Diagnosis not present

## 2021-09-18 DIAGNOSIS — E119 Type 2 diabetes mellitus without complications: Secondary | ICD-10-CM | POA: Diagnosis not present

## 2021-10-03 DIAGNOSIS — E119 Type 2 diabetes mellitus without complications: Secondary | ICD-10-CM | POA: Diagnosis not present

## 2021-10-15 DIAGNOSIS — E782 Mixed hyperlipidemia: Secondary | ICD-10-CM | POA: Diagnosis not present

## 2021-10-15 DIAGNOSIS — I5032 Chronic diastolic (congestive) heart failure: Secondary | ICD-10-CM | POA: Diagnosis not present

## 2021-10-15 DIAGNOSIS — I25119 Atherosclerotic heart disease of native coronary artery with unspecified angina pectoris: Secondary | ICD-10-CM | POA: Diagnosis not present

## 2021-10-15 DIAGNOSIS — E1165 Type 2 diabetes mellitus with hyperglycemia: Secondary | ICD-10-CM | POA: Diagnosis not present

## 2021-10-25 ENCOUNTER — Other Ambulatory Visit: Payer: Self-pay | Admitting: Nurse Practitioner

## 2021-11-03 DIAGNOSIS — E119 Type 2 diabetes mellitus without complications: Secondary | ICD-10-CM | POA: Diagnosis not present

## 2021-11-05 ENCOUNTER — Ambulatory Visit (HOSPITAL_COMMUNITY): Payer: Medicare HMO

## 2021-11-05 ENCOUNTER — Other Ambulatory Visit (HOSPITAL_COMMUNITY): Payer: Medicare HMO

## 2021-11-11 DIAGNOSIS — Z0001 Encounter for general adult medical examination with abnormal findings: Secondary | ICD-10-CM | POA: Diagnosis not present

## 2021-11-11 DIAGNOSIS — M62830 Muscle spasm of back: Secondary | ICD-10-CM | POA: Diagnosis not present

## 2021-11-11 DIAGNOSIS — M545 Low back pain, unspecified: Secondary | ICD-10-CM | POA: Diagnosis not present

## 2021-11-12 ENCOUNTER — Ambulatory Visit (HOSPITAL_COMMUNITY): Payer: Medicare HMO | Admitting: Hematology

## 2021-11-12 ENCOUNTER — Encounter (HOSPITAL_COMMUNITY): Payer: Medicare HMO

## 2021-11-17 ENCOUNTER — Emergency Department (HOSPITAL_BASED_OUTPATIENT_CLINIC_OR_DEPARTMENT_OTHER)
Admission: EM | Admit: 2021-11-17 | Discharge: 2021-11-17 | Disposition: A | Discharge status: Home / Self Care | Payer: Medicare HMO | Attending: Emergency Medicine | Admitting: Emergency Medicine

## 2021-11-17 ENCOUNTER — Other Ambulatory Visit: Payer: Self-pay

## 2021-11-17 ENCOUNTER — Encounter (HOSPITAL_BASED_OUTPATIENT_CLINIC_OR_DEPARTMENT_OTHER): Payer: Self-pay

## 2021-11-17 ENCOUNTER — Emergency Department (HOSPITAL_BASED_OUTPATIENT_CLINIC_OR_DEPARTMENT_OTHER): Payer: Medicare HMO | Admitting: Radiology

## 2021-11-17 DIAGNOSIS — W182XXA Fall in (into) shower or empty bathtub, initial encounter: Secondary | ICD-10-CM | POA: Diagnosis not present

## 2021-11-17 DIAGNOSIS — M533 Sacrococcygeal disorders, not elsewhere classified: Secondary | ICD-10-CM | POA: Diagnosis not present

## 2021-11-17 DIAGNOSIS — Z794 Long term (current) use of insulin: Secondary | ICD-10-CM | POA: Insufficient documentation

## 2021-11-17 DIAGNOSIS — Y92002 Bathroom of unspecified non-institutional (private) residence single-family (private) house as the place of occurrence of the external cause: Secondary | ICD-10-CM | POA: Diagnosis not present

## 2021-11-17 DIAGNOSIS — S39012A Strain of muscle, fascia and tendon of lower back, initial encounter: Secondary | ICD-10-CM | POA: Diagnosis not present

## 2021-11-17 DIAGNOSIS — Z7901 Long term (current) use of anticoagulants: Secondary | ICD-10-CM | POA: Diagnosis not present

## 2021-11-17 DIAGNOSIS — S3992XA Unspecified injury of lower back, initial encounter: Secondary | ICD-10-CM | POA: Diagnosis present

## 2021-11-17 DIAGNOSIS — M545 Low back pain, unspecified: Secondary | ICD-10-CM | POA: Diagnosis not present

## 2021-11-17 MED ORDER — OXYCODONE-ACETAMINOPHEN 5-325 MG PO TABS
1.0000 | ORAL_TABLET | Freq: Once | ORAL | Status: AC
  Administered 2021-11-17: 1 via ORAL
  Filled 2021-11-17: qty 1

## 2021-11-17 MED ORDER — PREDNISONE 10 MG (21) PO TBPK: ORAL_TABLET | Freq: Every day | ORAL | 0 refills | Status: DC

## 2021-11-17 MED ORDER — METHOCARBAMOL 500 MG PO TABS: 500.0000 mg | ORAL_TABLET | Freq: Three times a day (TID) | ORAL | 0 refills | Status: DC | PRN

## 2021-11-17 NOTE — Discharge Instructions (Signed)
Follow-up with your primary doctor.  Recommend trial of the steroid taper.  You can also take the prescribed muscle relaxer as needed.  Please note this can make you drowsy and should not be taken while driving or operating heavy machinery.  If you develop numbness, weakness in your arms and legs, uncontrolled pain or other new concerning symptom, come back to ER for reassessment.

## 2021-11-17 NOTE — ED Provider Notes (Signed)
[place] EMERGENCY DEPT Provider Note   CSN: [id number] Arrival date & time: 11/17/21  1227     History  Chief Complaint  Patient presents with   Back Pain    Jacqueline Bird is a 71 y.o. female.  Presents emergency room due to concern for low back pain.  About a week or so ago she had a fall in her bathroom.  States that she did not truly fall but slid slowly to the ground.  Ever since then she has been dealing with low back pain.  At times radiates down her legs.  Currently at rest is having minimal pain.  Pain is significantly worsened with movements.  She has been able to walk.  Sometimes has pain with walking however.  Took a course of steroids by her primary care doctor and things seem to be doing better but then as she has been finishing up her steroid course things seem to be getting worse again.  No numbness or weakness in her legs, no bladder or bowel incontinence.  She denies hitting her head and denies any other associated trauma from the fall a week ago.  HPI     Home Medications Prior to Admission medications   Medication Sig Start Date End Date Taking? Authorizing Provider  methocarbamol (ROBAXIN) 500 MG tablet Take 1 tablet (500 mg total) by mouth every 8 (eight) hours as needed for muscle spasms. 11/17/21  Yes Khylee Algeo, Ellwood Dense, MD  predniSONE (STERAPRED UNI-PAK 21 TAB) 10 MG (21) TBPK tablet Take by mouth daily. Take 6 tabs by mouth daily  for 1 day, then 5 tabs for 1 day, then 4 tabs for 1 day, then 3 tabs for 1 day, 2 tabs for 1 day, then 1 tab by mouth daily for 1 day 11/17/21  Yes Emmelyn Schmale, Ellwood Dense, MD  acetaminophen (TYLENOL) 500 MG tablet Take 500-1,000 mg by mouth every 6 (six) hours as needed (for pain.).    [provider]  ALPRAZolam Duanne Moron) 0.5 MG tablet TAKE 1 TABLET BY MOUTH AT BEDTIME AS NEEDED FOR ANXIETY / SLEEP 03/25/20   Derek Jack, MD  atorvastatin (LIPITOR) 80 MG tablet Take 80 mg by mouth every evening.  11/29/19    [provider]  Cholecalciferol (VITAMIN D) 125 MCG (5000 UT) CAPS Take 5,000 Units by mouth daily.    [provider]  ELIQUIS 5 MG TABS tablet Take 5 mg by mouth 2 (two) times daily. 02/19/21   [provider]  FARXIGA 10 MG TABS tablet Take 10 mg by mouth daily. 02/01/20   [provider]  furosemide (LASIX) 20 MG tablet Take 20 mg by mouth daily. 02/19/21   [provider]  gabapentin (NEURONTIN) 100 MG capsule Take 100-200 mg by mouth See admin instructions. Take 2 capsules (200 mg) by mouth in the morning and take 1 capsule ('100mg'$ ) at night. 01/09/19   [provider]  NOVOLOG FLEXPEN 100 UNIT/ML FlexPen Inject 4-6 Units into the skin in the morning, at noon, and at bedtime.  02/01/20   [provider]  sertraline (ZOLOFT) 50 MG tablet Take 50 mg by mouth daily.  11/29/19   [provider]  traZODone (DESYREL) 50 MG tablet Take 50 mg by mouth at bedtime. 08/28/19   [provider]  TRESIBA FLEXTOUCH 200 UNIT/ML SOPN Inject 20 Units into the skin daily before breakfast. Patient taking differently: Inject 26 Units into the skin daily before breakfast. 05/27/19   Kathie Dike, MD  TRULICITY  0.75 MG/0.5ML SOPN Inject 0.75 mg into the muscle every Sunday.  01/10/19   [provider]  vitamin B-12 (CYANOCOBALAMIN) 1000 MCG tablet Take 2,000 mcg by mouth daily. Patient not taking: Reported on 06/24/2021    [provider]      Allergies    Sulfa antibiotics    Review of Systems   Review of Systems  Constitutional:  Negative for chills and fever.  HENT:  Negative for ear pain and sore throat.   Eyes:  Negative for pain and visual disturbance.  Respiratory:  Negative for cough and shortness of breath.   Cardiovascular:  Negative for chest pain and palpitations.  Gastrointestinal:  Negative for abdominal pain and vomiting.  Genitourinary:  Negative for dysuria and hematuria.  Musculoskeletal:  Positive  for back pain. Negative for arthralgias.  Skin:  Negative for color change and rash.  Neurological:  Negative for seizures and syncope.  All other systems reviewed and are negative.   Physical Exam Updated Vital Signs BP (!) 148/64   Pulse 77   Temp 98.4 F (36.9 C)   Resp 16   Ht 5' 4.5" (1.638 m)   Wt 83.8 kg   SpO2 100%   BMI 31.22 kg/m  Physical Exam Vitals and nursing note reviewed.  Constitutional:      General: She is not in acute distress.    Appearance: She is well-developed.  HENT:     Head: Normocephalic and atraumatic.  Eyes:     Conjunctiva/sclera: Conjunctivae normal.  Cardiovascular:     Rate and Rhythm: Normal rate.     Pulses: Normal pulses.  Pulmonary:     Effort: Pulmonary effort is normal. No respiratory distress.     Breath sounds: Normal breath sounds.  Abdominal:     Palpations: Abdomen is soft.     Tenderness: There is no abdominal tenderness.  Musculoskeletal:     Cervical back: Neck supple.     Comments: Back: There is some tenderness to her lower L-spine and along coccyx RUE: no TTP throughout, no deformity, normal joint ROM, radial pulse intact, distal sensation and motor intact LUE: no TTP throughout, no deformity, normal joint ROM, radial pulse intact, distal sensation and motor intact RLE:  no TTP throughout, no deformity, normal joint ROM, distal pulse, sensation and motor intact LLE: no TTP throughout, no deformity, normal joint ROM, distal pulse, sensation and motor intact  Skin:    General: Skin is warm and dry.     Capillary Refill: Capillary refill takes less than 2 seconds.  Neurological:     Mental Status: She is alert.  Psychiatric:        Mood and Affect: Mood normal.     ED Results / Procedures / Treatments   Labs (all labs ordered are listed, but only abnormal results are displayed) Labs Reviewed - No data to display  EKG None  Radiology DG Sacrum/Coccyx  Result Date: 11/17/2021 CLINICAL DATA:  Buttock pain  after fall. EXAM: SACRUM AND COCCYX - 2+ VIEW COMPARISON:  CT abdomen pelvis dated October 25, 2020. FINDINGS: There is no evidence of fracture or other focal bone lesions. IMPRESSION: Negative. Electronically Signed   By: Titus Dubin M.D.   On: 11/17/2021 16:28   DG Lumbar Spine Complete  Result Date: 11/17/2021 CLINICAL DATA:  Buttock pain after fall. EXAM: LUMBAR SPINE - COMPLETE 4+ VIEW COMPARISON:  CT abdomen pelvis dated October 25, 2020. FINDINGS: Five lumbar type vertebral bodies. No acute fracture or subluxation.  Vertebral body heights are preserved. Alignment is normal. Unchanged moderate disc height loss at T12-L1 and L1-L2. Unchanged mild disc height loss from L2-L3 through L4-L5. IMPRESSION: 1. No acute osseous abnormality. Electronically Signed   By: Titus Dubin M.D.   On: 11/17/2021 16:27    Procedures Procedures    Medications Ordered in ED Medications  oxyCODONE-acetaminophen (PERCOCET/ROXICET) 5-325 MG per tablet 1 tablet (1 tablet Oral Given 11/17/21 1549)    ED Course/ Medical Decision Making/ A&P                           Medical Decision Making Amount and/or Complexity of Data Reviewed Radiology: ordered.  Risk Prescription drug management.   71 year old lady presented for low back pain.  Injury occurred over a week ago.  I did appreciate some tenderness along her lower L-spine and upper coccyx.  No other traumatic findings on exam.  She appears well and is in no distress and has no loss of sensation or motor in her legs.  I independently reviewed and interpreted x-ray, no acute fracture or dislocation noted in the lumbar spine or coccyx.  Suspect MSK strain.  Patient reports prior improvement with prior steroid course and expressed interest in completing additional steroids, will give her Rx for additional steroid taper.  Additionally gave Rx for muscle relaxer.  Advised her to follow-up with her primary care doctor for recheck in a few days.  Reviewed return  precautions and discharged.  After the discussed management above, the patient was determined to be safe for discharge.  The patient was in agreement with this plan and all questions regarding their care were answered.  ED return precautions were discussed and the patient will return to the ED with any significant worsening of condition.         Final Clinical Impression(s) / ED Diagnoses Final diagnoses:  Strain of lumbar region, initial encounter  Strain of coccyx, initial encounter    Rx / DC Orders ED Discharge Orders          Ordered    predniSONE (STERAPRED UNI-PAK 21 TAB) 10 MG (21) TBPK tablet  Daily        11/17/21 1648    methocarbamol (ROBAXIN) 500 MG tablet  Every 8 hours PRN        11/17/21 1648              Lucrezia Starch, MD 11/17/21 1740

## 2021-11-17 NOTE — ED Triage Notes (Signed)
Patient here POV from Home.  Endorses slipping approximately 1 Week PTA. 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 Not a Complete Fall. No Head Injury. No LOC.  Seen by PCP as Pain began a few days after occurrence. Given Muscle Relaxer and Steroids by PCP with no Alleviation of Symptoms. Worse yesterday and Seeks Evaluation today.   No Changes in BM or Urine Output.   NAD Noted during Triage. A&Ox4. GCS 15. BIB Wheelchair

## 2021-11-17 NOTE — ED Notes (Signed)
RN provided AVS using Teachback Method. Patient verbalizes understanding of Discharge Instructions. Opportunity for Questioning and Answers were provided by RN. Patient Discharged from ED ambulatory with [facility] to Home with Visitor.

## 2021-11-24 DIAGNOSIS — M545 Low back pain, unspecified: Secondary | ICD-10-CM | POA: Diagnosis not present

## 2021-12-04 DIAGNOSIS — E119 Type 2 diabetes mellitus without complications: Secondary | ICD-10-CM | POA: Diagnosis not present

## 2021-12-15 DIAGNOSIS — I25119 Atherosclerotic heart disease of native coronary artery with unspecified angina pectoris: Secondary | ICD-10-CM | POA: Diagnosis not present

## 2021-12-15 DIAGNOSIS — I5032 Chronic diastolic (congestive) heart failure: Secondary | ICD-10-CM | POA: Diagnosis not present

## 2021-12-15 DIAGNOSIS — E782 Mixed hyperlipidemia: Secondary | ICD-10-CM | POA: Diagnosis not present

## 2021-12-15 DIAGNOSIS — E1165 Type 2 diabetes mellitus with hyperglycemia: Secondary | ICD-10-CM | POA: Diagnosis not present

## 2021-12-22 ENCOUNTER — Ambulatory Visit (INDEPENDENT_AMBULATORY_CARE_PROVIDER_SITE_OTHER): Payer: Medicare HMO | Admitting: Gastroenterology

## 2021-12-22 ENCOUNTER — Encounter (INDEPENDENT_AMBULATORY_CARE_PROVIDER_SITE_OTHER): Payer: Self-pay | Admitting: Gastroenterology

## 2021-12-26 DIAGNOSIS — E119 Type 2 diabetes mellitus without complications: Secondary | ICD-10-CM | POA: Diagnosis not present

## 2022-01-15 DIAGNOSIS — E782 Mixed hyperlipidemia: Secondary | ICD-10-CM | POA: Diagnosis not present

## 2022-01-15 DIAGNOSIS — E1165 Type 2 diabetes mellitus with hyperglycemia: Secondary | ICD-10-CM | POA: Diagnosis not present

## 2022-01-15 DIAGNOSIS — I5032 Chronic diastolic (congestive) heart failure: Secondary | ICD-10-CM | POA: Diagnosis not present

## 2022-01-15 DIAGNOSIS — I25119 Atherosclerotic heart disease of native coronary artery with unspecified angina pectoris: Secondary | ICD-10-CM | POA: Diagnosis not present

## 2022-01-26 DIAGNOSIS — F418 Other specified anxiety disorders: Secondary | ICD-10-CM | POA: Diagnosis not present

## 2022-01-26 DIAGNOSIS — Z23 Encounter for immunization: Secondary | ICD-10-CM | POA: Diagnosis not present

## 2022-01-26 DIAGNOSIS — R69 Illness, unspecified: Secondary | ICD-10-CM | POA: Diagnosis not present

## 2022-02-02 ENCOUNTER — Inpatient Hospital Stay: Payer: Medicare HMO | Attending: Hematology

## 2022-02-02 DIAGNOSIS — C210 Malignant neoplasm of anus, unspecified: Secondary | ICD-10-CM | POA: Diagnosis not present

## 2022-02-02 DIAGNOSIS — Z8541 Personal history of malignant neoplasm of cervix uteri: Secondary | ICD-10-CM | POA: Diagnosis not present

## 2022-02-02 DIAGNOSIS — Z86718 Personal history of other venous thrombosis and embolism: Secondary | ICD-10-CM | POA: Diagnosis not present

## 2022-02-02 DIAGNOSIS — E119 Type 2 diabetes mellitus without complications: Secondary | ICD-10-CM | POA: Insufficient documentation

## 2022-02-02 DIAGNOSIS — Z800 Family history of malignant neoplasm of digestive organs: Secondary | ICD-10-CM | POA: Insufficient documentation

## 2022-02-02 DIAGNOSIS — Z803 Family history of malignant neoplasm of breast: Secondary | ICD-10-CM | POA: Diagnosis not present

## 2022-02-02 DIAGNOSIS — I10 Essential (primary) hypertension: Secondary | ICD-10-CM | POA: Insufficient documentation

## 2022-02-02 DIAGNOSIS — Z7901 Long term (current) use of anticoagulants: Secondary | ICD-10-CM | POA: Insufficient documentation

## 2022-02-02 LAB — COMPREHENSIVE METABOLIC PANEL
ALT: 17 U/L (ref 0–44)
AST: 20 U/L (ref 15–41)
Albumin: 3.7 g/dL (ref 3.5–5.0)
Alkaline Phosphatase: 78 U/L (ref 38–126)
Anion gap: 8 (ref 5–15)
BUN: 23 mg/dL (ref 8–23)
CO2: 27 mmol/L (ref 22–32)
Calcium: 9.3 mg/dL (ref 8.9–10.3)
Chloride: 105 mmol/L (ref 98–111)
Creatinine, Ser: 0.68 mg/dL (ref 0.44–1.00)
GFR, Estimated: >60 mL/min (ref >60)
Glucose, Bld: 126 mg/dL — ABNORMAL HIGH (ref 70–99)
Potassium: 4 mmol/L (ref 3.5–5.1)
Sodium: 140 mmol/L (ref 135–145)
Total Bilirubin: 0.7 mg/dL (ref 0.3–1.2)
Total Protein: 7.2 g/dL (ref 6.5–8.1)

## 2022-02-02 LAB — CBC WITH DIFFERENTIAL/PLATELET
Abs Immature Granulocytes: 0.01 10*3/uL (ref 0.00–0.07)
Basophils Absolute: 0.1 10*3/uL (ref 0.0–0.1)
Basophils Relative: 1 %
Eosinophils Absolute: 0.1 10*3/uL (ref 0.0–0.5)
Eosinophils Relative: 2 %
HCT: 43.7 % (ref 36.0–46.0)
Hemoglobin: 14 g/dL (ref 12.0–15.0)
Immature Granulocytes: 0 %
Lymphocytes Relative: 16 %
Lymphs Abs: 0.9 10*3/uL (ref 0.7–4.0)
MCH: 30.4 pg (ref 26.0–34.0)
MCHC: 32 g/dL (ref 30.0–36.0)
MCV: 95 fL (ref 80.0–100.0)
Monocytes Absolute: 0.5 10*3/uL (ref 0.1–1.0)
Monocytes Relative: 8 %
Neutro Abs: 4.2 10*3/uL (ref 1.7–7.7)
Neutrophils Relative %: 73 %
Platelets: 229 10*3/uL (ref 150–400)
RBC: 4.6 MIL/uL (ref 3.87–5.11)
RDW: 14.3 % (ref 11.5–15.5)
WBC: 5.8 10*3/uL (ref 4.0–10.5)
nRBC: 0 % (ref 0.0–0.2)

## 2022-02-03 ENCOUNTER — Other Ambulatory Visit: Payer: Medicare HMO

## 2022-02-03 ENCOUNTER — Ambulatory Visit (HOSPITAL_COMMUNITY)
Admission: RE | Admit: 2022-02-03 | Discharge: 2022-02-03 | Disposition: A | Discharge status: Home / Self Care | Payer: Medicare HMO | Source: Ambulatory Visit | Attending: Hematology | Admitting: Hematology

## 2022-02-03 DIAGNOSIS — K6389 Other specified diseases of intestine: Secondary | ICD-10-CM | POA: Diagnosis not present

## 2022-02-03 DIAGNOSIS — C210 Malignant neoplasm of anus, unspecified: Secondary | ICD-10-CM | POA: Insufficient documentation

## 2022-02-03 MED ORDER — IOHEXOL 300 MG/ML  SOLN
100.0000 mL | Freq: Once | INTRAMUSCULAR | Status: AC | PRN
  Administered 2022-02-03: 100 mL via INTRAVENOUS

## 2022-02-05 ENCOUNTER — Encounter (HOSPITAL_COMMUNITY): Payer: Self-pay

## 2022-02-05 ENCOUNTER — Ambulatory Visit (INDEPENDENT_AMBULATORY_CARE_PROVIDER_SITE_OTHER): Payer: Medicare HMO | Admitting: Clinical

## 2022-02-05 DIAGNOSIS — R69 Illness, unspecified: Secondary | ICD-10-CM | POA: Diagnosis not present

## 2022-02-05 DIAGNOSIS — F331 Major depressive disorder, recurrent, moderate: Secondary | ICD-10-CM

## 2022-02-05 DIAGNOSIS — F419 Anxiety disorder, unspecified: Secondary | ICD-10-CM

## 2022-02-05 NOTE — Plan of Care (Signed)
Verbal Consent 

## 2022-02-05 NOTE — Progress Notes (Signed)
IN PERSON  I connected with Jacqueline Bird on 02/05/22 at 10:00 AM EDT in person and verified that I am speaking with the correct person using two identifiers.  Location: Patient: Office Provider: Office   I discussed the limitations of evaluation and management by telemedicine and the availability of in person appointments. The patient expressed understanding and agreed to proceed.    Comprehensive Clinical Assessment (CCA) Note  02/05/2022 Jacqueline Bird [id number]  Chief Complaint: Difficulty in controlling mood and nerves Visit Diagnosis:  Recurrent Moderate Major Depressive Disorder with Anxiety   CCA Screening, Triage and Referral (STR)  Patient Reported Information How did you hear about [place]? No data recorded Referral name: No data recorded Referral phone number: No data recorded  Whom do you see for routine medical problems? No data recorded Practice/Facility Name: No data recorded Practice/Facility Phone Number: No data recorded Name of Contact: No data recorded Contact Number: No data recorded Contact Fax Number: No data recorded Prescriber Name: No data recorded Prescriber Address (if known): No data recorded  What Is the Reason for Your Visit/Call Today? No data recorded How Long Has This Been Causing You Problems? No data recorded What Do You Feel Would Help You the Most Today? No data recorded  Have You Recently Been in Any Inpatient Treatment (Hospital/Detox/Crisis Center/28-Day Program)? No data recorded Name/Location of Program/Hospital:No data recorded How Long Were You There? No data recorded When Were You Discharged? No data recorded  Have You Ever Received Services From [facility] Before? No data recorded Who Do You See at [facility]? No data recorded  Have You Recently Had Any Thoughts About Hurting Yourself? No data recorded Are You Planning to Commit Suicide/Harm Yourself At This time? No data recorded  Have you Recently Had Thoughts About  [place]? No data recorded Explanation: No data recorded  Have You Used Any Alcohol or Drugs in the Past 24 Hours? No data recorded How Long Ago Did You Use Drugs or Alcohol? No data recorded What Did You Use and How Much? No data recorded  Do You Currently Have a Therapist/Psychiatrist? No data recorded Name of Therapist/Psychiatrist: No data recorded  Have You Been Recently Discharged From Any Office Practice or Programs? No data recorded Explanation of Discharge From Practice/Program: No data recorded    CCA Screening Triage Referral Assessment Type of Contact: No data recorded Is this Initial or Reassessment? No data recorded Date Telepsych consult ordered in [facility]:  No data recorded Time Telepsych consult ordered in [facility]:  No data recorded  Patient Reported Information Reviewed? No data recorded Patient Left Without Being Seen? No data recorded Reason for Not Completing Assessment: No data recorded  Collateral Involvement: No data recorded  Does Patient Have a [place]? No data recorded Name and Contact of Legal Guardian: No data recorded If Minor and Not Living with Parent(s), Who has Custody? No data recorded Is CPS involved or ever been involved? No data recorded Is APS involved or ever been involved? No data recorded  Patient Determined To Be At Risk for Harm To Self or Others Based on Review of Patient Reported Information or Presenting Complaint? No data recorded Method: No data recorded Availability of Means: No data recorded Intent: No data recorded Notification Required: No data recorded Additional Information for Danger to Others Potential: No data recorded Additional Comments for Danger to Others Potential: No data recorded Are There Guns or Other Weapons in Your Home? No data recorded Types of Guns/Weapons: No  data recorded Are These Weapons Safely Secured?                            No data recorded Who Could Verify You Are  Able To Have These Secured: No data recorded Do You Have any Outstanding Charges, Pending Court Dates, Parole/Probation? No data recorded Contacted To Inform of Risk of Harm To Self or Others: No data recorded  Location of Assessment: No data recorded  Does Patient Present under Involuntary Commitment? No data recorded IVC Papers Initial File Date: No data recorded  [place] of Residence: No data recorded  Patient Currently Receiving the Following Services: No data recorded  Determination of Need: No data recorded  Options For Referral: No data recorded    CCA Biopsychosocial Intake/Chief Complaint:  The patient was reffered by her PCP, [facility] for evaluation for Depression.  Current Symptoms/Problems: The patient notes she has had difficulty with managing her mood, irratability, and striking out at others.   Patient Reported Schizophrenia/Schizoaffective Diagnosis in Past: No   Strengths: Talking to other / socialization, compassionate, giving,  spiritual  Preferences: The patient notes that she is frequently with caregiving for her older sister.  Abilities: Shopping, Decorating   Type of Services Patient Feels are Needed: The patient is currently receiving Mental Health medication Setraline from her PCP and was recently increase from '50mg'$  to '100mg'$ . and Individual Therapy   Initial Clinical Notes/Concerns: The patient has recently been involved with counsling, No prior MH hospitalizations. Currently doing med management with [facility]   Mental Health Symptoms Depression:   Change in energy/activity; Difficulty Concentrating; Fatigue; Tearfulness; Irritability; Weight gain/loss   Duration of Depressive symptoms: Greater than 2 weeks  Mania:   None   Anxiety:    Worrying; Tension; Sleep; Restlessness; Irritability; Fatigue; Difficulty concentrating   Psychosis:   None   Duration of Psychotic symptoms: NA  Trauma:   None    Obsessions:   None   Compulsions:   None   Inattention:   None   Hyperactivity/Impulsivity:   None   Oppositional/Defiant Behaviors:   None   Emotional Irregularity:   None   Other Mood/Personality Symptoms:   NA    Mental Status Exam Appearance and self-care  Stature:   Average   Weight:   Average weight   Clothing:   Casual   Grooming:   Normal   Cosmetic use:   Age appropriate   Posture/gait:  Normal  Motor activity:  Normal  Sensorium  Attention:   Distractible   Concentration:   Anxiety interferes   Orientation:   X5   Recall/memory:   Defective in Short-term   Affect and Mood  Affect:   Appropriate   Mood:   Depressed; Anxious   Relating  Eye contact:   Normal   Facial expression:   Depressed   Attitude toward examiner:   Cooperative   Thought and Language  Speech flow:  Normal   Thought content:   Appropriate to Mood and Circumstances   Preoccupation:   None   Hallucinations:   None   Organization:  Logical  Transport planner of Knowledge:   Good   Intelligence:   Average   Abstraction:   Normal   Judgement:   Good   Reality Testing:   Realistic   Insight:   Good   Decision Making:   Normal   Social Functioning  Social Maturity:  Responsible   Social Judgement:   Normal   Stress  Stressors:   Family conflict; Illness; Transitions   Coping Ability:   Normal   Skill Deficits:   None   Supports:   Friends/Service system; Family; Church     Religion: Religion/Spirituality Are You A Religious Person?: Yes What is Your Religious Affiliation?: Baptist How Might This Affect Treatment?: Protective  Leisure/Recreation: Leisure / Recreation Do You Have Hobbies?: Yes Leisure and Hobbies: Shopping.  Exercise/Diet: Exercise/Diet Do You Exercise?: No Have You Gained or Lost A Significant Amount of Weight in the Past Six Months?: No Do You Follow a Special Diet?: No Do  You Have Any Trouble Sleeping?: No   CCA Employment/Education Employment/Work Situation: Employment / Work Nurse, children's Situation: Retired Social research officer, government has Been Impacted by Current Illness: No What is the Longest Time Patient has Held a Job?: 12 Where was the Patient Employed at that Time?: [employer] Has Patient ever Been in the [employer]?: No  Education: Education Is Patient Currently Attending School?: No Last Grade Completed: 12 Name of High School: [employer] Did Teacher, adult education From [employer]?: Yes Did Physicist, medical?: Yes What Type of College Degree Do you Have?: [employer] ([place])  paralegal degree from community college Did [place]?: No What Was Your Major?: NA Did You Have Any Special Interests In School?: NA Did You Have An Individualized Education Program (IIEP): No Did You Have Any Difficulty At School?: No Patient's Education Has Been Impacted by Current Illness: No   CCA Family/Childhood History Family and Relationship History: Family history Marital status: Divorced Divorced, when?: 1988 What types of issues is patient dealing with in the relationship?: No Additional issues connected to the divorce Additional relationship information: No Additional Information Are you sexually active?: No What is your sexual orientation?: Heterosexaul Has your sexual activity been affected by drugs, alcohol, medication, or emotional stress?: NA Does patient have children?: Yes How many children?: 2 How is patient's relationship with their children?: The patient notes, " I have a great relationship with my children currently, but they stay super busy".  Childhood History:  Childhood History By whom was/is the patient raised?: Both parents Additional childhood history information: No Additional Description of patient's relationship with caregiver when they were a child: The patient notes, " I had a wonderful  Childhood Patient's description of current relationship with people who raised him/her: Both parents are deceased. How were you disciplined when you got in trouble as a child/adolescent?: Grounding Does patient have siblings?: Yes Number of Siblings: 3 Description of patient's current relationship with siblings: The patient notes she lost her brother and siter who have passed away and her oldest sister is still alive and she helps/ is involved alot with her caregiving along with her oldest sisters daughters. Did patient suffer any verbal/emotional/physical/sexual abuse as a child?: No Did patient suffer from severe childhood neglect?: No Has patient ever been sexually abused/assaulted/raped as an adolescent or adult?: No Was the patient ever a victim of a crime or a disaster?: No Witnessed domestic violence?: No Has patient been affected by domestic violence as an adult?: No  Child/Adolescent Assessment:     CCA Substance Use Alcohol/Drug Use: Alcohol / Drug Use Pain Medications: See MAR Prescriptions: See MAR Over the Counter: D3 , B12 vitamins History of alcohol / drug use?: No history of alcohol / drug abuse Longest period of sobriety (when/how long): NA  ASAM's:  Six Dimensions of Multidimensional Assessment  Dimension 1:  Acute Intoxication and/or Withdrawal Potential:      Dimension 2:  Biomedical Conditions and Complications:      Dimension 3:  Emotional, Behavioral, or Cognitive Conditions and Complications:     Dimension 4:  Readiness to Change:     Dimension 5:  Relapse, Continued use, or Continued Problem Potential:     Dimension 6:  Recovery/Living Environment:     ASAM Severity Score:    ASAM Recommended Level of Treatment:     Substance use Disorder (SUD)    Recommendations for Services/Supports/Treatments: Recommendations for Services/Supports/Treatments Recommendations For Services/Supports/Treatments: Individual Therapy,  Medication Management  DSM5 Diagnoses: Patient Active Problem List   Diagnosis Date Noted   Cirrhosis of liver without ascites ([place]) 06/24/2021   Pleural effusion, right    Acute diastolic CHF (congestive heart failure) ([place]) 05/23/2019   Acute respiratory failure with hypoxia ([place]) 05/23/2019   Hypokalemia 05/23/2019   Essential hypertension 05/23/2019   Type 2 diabetes mellitus with other specified complication ([place]) 55/73/2202   Anxiety 05/23/2019   CHF (congestive heart failure) ([place]) 05/21/2019   Anal cancer ([place]) 02/01/2019   Mass of anus 01/24/2019   Presbycusis of both ears 03/18/2017   Sudden hearing loss, left 03/18/2017    Patient Centered Plan: Patient is on the following Treatment Plan(s):  Recurrent Moderate Major Depression with Anxiety   Referrals to Alternative Service(s): Referred to Alternative Service(s):   Place:   Date:   Time:    Referred to Alternative Service(s):   Place:   Date:   Time:    Referred to Alternative Service(s):   Place:   Date:   Time:    Referred to Alternative Service(s):   Place:   Date:   Time:      Collaboration of Care: No additional collaboration for this session.   Patient/Guardian was advised Release of Information must be obtained prior to any record release in order to collaborate their care with an outside provider. Patient/Guardian was advised if they have not already done so to contact the registration department to sign all necessary forms in order for [place] to release information regarding their care.   Consent: Patient/Guardian gives verbal consent for treatment and assignment of benefits for services provided during this visit. Patient/Guardian expressed understanding and agreed to proceed.   I discussed the assessment and treatment plan with the patient. The patient was provided an opportunity to ask questions and all were answered. The patient agreed with the plan and demonstrated an understanding of the instructions.    The patient was advised to call back or seek an in-person evaluation if the symptoms worsen or if the condition fails to improve as anticipated.  I provided 60 minutes of face-to-face time during this encounter.  Lennox Grumbles, LCSW  02/05/2022

## 2022-02-09 ENCOUNTER — Inpatient Hospital Stay: Payer: Medicare HMO

## 2022-02-09 ENCOUNTER — Inpatient Hospital Stay: Payer: Medicare HMO | Admitting: Hematology

## 2022-02-09 DIAGNOSIS — Z800 Family history of malignant neoplasm of digestive organs: Secondary | ICD-10-CM | POA: Diagnosis not present

## 2022-02-09 DIAGNOSIS — Z86718 Personal history of other venous thrombosis and embolism: Secondary | ICD-10-CM | POA: Diagnosis not present

## 2022-02-09 DIAGNOSIS — C210 Malignant neoplasm of anus, unspecified: Secondary | ICD-10-CM | POA: Diagnosis not present

## 2022-02-09 DIAGNOSIS — Z7901 Long term (current) use of anticoagulants: Secondary | ICD-10-CM | POA: Diagnosis not present

## 2022-02-09 DIAGNOSIS — Z8541 Personal history of malignant neoplasm of cervix uteri: Secondary | ICD-10-CM | POA: Diagnosis not present

## 2022-02-09 DIAGNOSIS — E119 Type 2 diabetes mellitus without complications: Secondary | ICD-10-CM | POA: Diagnosis not present

## 2022-02-09 DIAGNOSIS — I10 Essential (primary) hypertension: Secondary | ICD-10-CM | POA: Diagnosis not present

## 2022-02-09 DIAGNOSIS — Z803 Family history of malignant neoplasm of breast: Secondary | ICD-10-CM | POA: Diagnosis not present

## 2022-02-09 MED ORDER — SODIUM CHLORIDE 0.9% FLUSH
10.0000 mL | INTRAVENOUS | Status: DC | PRN
  Administered 2022-02-09: 10 mL via INTRAVENOUS

## 2022-02-09 MED ORDER — HEPARIN SOD (PORK) LOCK FLUSH 100 UNIT/ML IV SOLN
500.0000 [IU] | Freq: Once | INTRAVENOUS | Status: AC
  Administered 2022-02-09: 500 [IU] via INTRAVENOUS

## 2022-02-09 NOTE — Patient Instructions (Signed)
[facility] [place] [facility]  Discharge Instructions: Thank you for choosing [facility] to provide your oncology and hematology care.  If you have a lab appointment with the Cancer Center, please come in thru the Main Entrance and check in at the main information desk.  Wear comfortable clothing and clothing appropriate for easy access to any Portacath or PICC line.   We strive to give you quality time with your provider. You may need to reschedule your appointment if you arrive late (15 or more minutes).  Arriving late affects you and other patients whose appointments are after yours.  Also, if you miss three or more appointments without notifying the office, you may be dismissed from the clinic at the provider's discretion.      For prescription refill requests, have your pharmacy contact our office and allow 72 hours for refills to be completed.    Today you received the following Port flush, return as scheduled.   To help prevent nausea and vomiting after your treatment, we encourage you to take your nausea medication as directed.  BELOW ARE SYMPTOMS THAT SHOULD BE REPORTED IMMEDIATELY: *FEVER GREATER THAN 100.4 F (38 C) OR HIGHER *CHILLS OR SWEATING *NAUSEA AND VOMITING THAT IS NOT CONTROLLED WITH YOUR NAUSEA MEDICATION *UNUSUAL SHORTNESS OF BREATH *UNUSUAL BRUISING OR BLEEDING *URINARY PROBLEMS (pain or burning when urinating, or frequent urination) *BOWEL PROBLEMS (unusual diarrhea, constipation, pain near the anus) TENDERNESS IN MOUTH AND THROAT WITH OR WITHOUT PRESENCE OF ULCERS (sore throat, sores in mouth, or a toothache) UNUSUAL RASH, SWELLING OR PAIN  UNUSUAL VAGINAL DISCHARGE OR ITCHING   Items with * indicate a potential emergency and should be followed up as soon as possible or go to the Emergency Department if any problems should occur.  Please show the CHEMOTHERAPY ALERT CARD or IMMUNOTHERAPY ALERT CARD at check-in to the Emergency Department and  triage nurse.  Should you have questions after your visit or need to cancel or reschedule your appointment, please contact [facility] [phone]  and follow the prompts.  Office hours are 8:00 a.m. to 4:30 p.m. Monday - Friday. Please note that voicemails left after 4:00 p.m. may not be returned until the following business day.  We are closed weekends and major holidays. You have access to a nurse at all times for urgent questions. Please call the main number to the clinic [phone] and follow the prompts.  For any non-urgent questions, you may also contact your provider using MyChart. We now offer e-Visits for anyone 18 and older to request care online for non-urgent symptoms. For details visit [email or web address].   Also download the MyChart app! Go to the app store, search "MyChart", open the app, select [facility], and log in with your MyChart username and password.  Masks are optional in the cancer centers. If you would like for your care team to wear a mask while they are taking care of you, please let them know. You may have one support person who is at least 70 years old accompany you for your appointments.  

## 2022-02-09 NOTE — Progress Notes (Signed)
Port flushed with good blood return noted. No bruising or swelling at site. Bandaid applied and patient discharged in satisfactory condition. VVS stable with no signs or symptoms of distressed noted. 

## 2022-02-10 ENCOUNTER — Inpatient Hospital Stay (HOSPITAL_BASED_OUTPATIENT_CLINIC_OR_DEPARTMENT_OTHER): Payer: Medicare HMO | Admitting: Hematology

## 2022-02-10 VITALS — BP 137/73 | HR 71 | Temp 97.9°F | Resp 18 | Ht 64.5 in | Wt 174.0 lb

## 2022-02-10 DIAGNOSIS — E119 Type 2 diabetes mellitus without complications: Secondary | ICD-10-CM | POA: Diagnosis not present

## 2022-02-10 DIAGNOSIS — C210 Malignant neoplasm of anus, unspecified: Secondary | ICD-10-CM

## 2022-02-10 DIAGNOSIS — I10 Essential (primary) hypertension: Secondary | ICD-10-CM | POA: Diagnosis not present

## 2022-02-10 DIAGNOSIS — Z86718 Personal history of other venous thrombosis and embolism: Secondary | ICD-10-CM | POA: Diagnosis not present

## 2022-02-10 DIAGNOSIS — Z803 Family history of malignant neoplasm of breast: Secondary | ICD-10-CM | POA: Diagnosis not present

## 2022-02-10 DIAGNOSIS — Z800 Family history of malignant neoplasm of digestive organs: Secondary | ICD-10-CM | POA: Diagnosis not present

## 2022-02-10 DIAGNOSIS — Z7901 Long term (current) use of anticoagulants: Secondary | ICD-10-CM | POA: Diagnosis not present

## 2022-02-10 DIAGNOSIS — Z8541 Personal history of malignant neoplasm of cervix uteri: Secondary | ICD-10-CM | POA: Diagnosis not present

## 2022-02-10 NOTE — Patient Instructions (Signed)
[facility]  Discharge Instructions  You were seen and examined today by Dr. Delton Coombes.  Dr. Delton Coombes discussed your most recent lab work and CT scan which revealed no signs of cancer.  Please follow-up with GI for a repeat colonoscopy.  Follow-up as scheduled.  Thank you for choosing [facility] to provide your oncology and hematology care.   To afford each patient quality time with our provider, please arrive at least 15 minutes before your scheduled appointment time. You may need to reschedule your appointment if you arrive late (10 or more minutes). Arriving late affects you and other patients whose appointments are after yours.  Also, if you miss three or more appointments without notifying the office, you may be dismissed from the clinic at the provider's discretion.    Again, thank you for choosing [facility].  Our hope is that these requests will decrease the amount of time that you wait before being seen by our physicians.   If you have a lab appointment with the [facility] please come in thru the Main Entrance and check in at the main information desk.           _____________________________________________________________  Should you have questions after your visit to [facility], please contact our office at [phone] and follow the prompts.  Our office hours are 8:00 a.m. to 4:30 p.m. Monday - Thursday and 8:00 a.m. to 2:30 p.m. Friday.  Please note that voicemails left after 4:00 p.m. may not be returned until the following business day.  We are closed weekends and all major holidays.  You do have access to a nurse 24-7, just call the main number to the clinic [phone] and do not press any options, hold on the line and a nurse will answer the phone.    For prescription refill requests, have your pharmacy contact our office and allow 72 hours.    Masks are optional in the cancer  centers. If you would like for your care team to wear a mask while they are taking care of you, please let them know. You may have one support person who is at least 70 years old accompany you for your appointments.

## 2022-02-10 NOTE — Progress Notes (Signed)
[address], [place] [address]   CLINIC:  Medical Oncology/Hematology  PCP:  Celene Squibb, MD [address] [place] [address] [phone]   REASON FOR VISIT:  Follow-up for anal cancer  PRIOR THERAPY: 5-FU & mitomycin from 03/03/2019 to 03/31/2019  NGS Results: not done  CURRENT THERAPY: surveillance  BRIEF ONCOLOGIC HISTORY:  Oncology History  Anal cancer ([facility])  02/01/2019 Initial Diagnosis   Anal cancer ([facility])   02/16/2019 Cancer Staging   Staging form: Anus, AJCC 8th Edition - Clinical: Stage IIIC (cT3, cN1c, cM0) - Signed by Kyung Rudd, MD on 02/16/2019   03/03/2019 - 04/04/2019 Chemotherapy   Patient is on Treatment Plan : ANUS Mitomycin D1,28 / 5FU D1-4, 28-31 q32d       CANCER STAGING:  Cancer Staging  Anal cancer ([facility]) Staging form: Anus, AJCC 8th Edition - Clinical: Stage IIIC (cT3, cN1c, cM0) - Signed by Kyung Rudd, MD on 02/16/2019   INTERVAL HISTORY:  Ms. Gerda Yin, a 71 y.o. female, returns for follow-up of anal cancer. Denies any bleeding per rectum or melena.  She did not have a colonoscopy done yet.  Energy levels are 50%.  On and off diarrhea has been stable.  REVIEW OF SYSTEMS:  Review of Systems  Constitutional:  Negative for appetite change (80%) and fatigue (80%).  Gastrointestinal:  Positive for diarrhea.  Musculoskeletal:  Negative for myalgias.  All other systems reviewed and are negative.   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   Cervical cancer ([facility])    Depression    DVT (deep venous thrombosis) ([facility])    Essential hypertension    History of cervical cancer    Mixed hyperlipidemia    Type 2 diabetes mellitus ([facility])    Past Surgical History:  Procedure Laterality Date   AIR/FLUID EXCHANGE Left 03/01/2020   Procedure: AIR/FLUID  EXCHANGE;  Surgeon: Jalene Mullet, MD;  Location: [facility];  Service: Ophthalmology;  Laterality: Left;   CATARACT EXTRACTION, BILATERAL Right 05/2017   CERVICAL CONE BIOPSY     HAND AMPUTATION Left    INJECTION OF SILICONE OIL Left 16/96/7893   Procedure: INJECTION OF SILICONE OIL;  Surgeon: Jalene Mullet, MD;  Location: [facility];  Service: Ophthalmology;  Laterality: Left;   PARS PLANA VITRECTOMY Left 03/01/2020   Procedure: PARS PLANA VITRECTOMY WITH 25 GAUGE;  Surgeon: Jalene Mullet, MD;  Location: [facility];  Service: Ophthalmology;  Laterality: Left;   PHOTOCOAGULATION WITH LASER Left 03/01/2020   Procedure: PHOTOCOAGULATION WITH LASER;  Surgeon: Jalene Mullet, MD;  Location: [facility];  Service: Ophthalmology;  Laterality: Left;   PORTACATH PLACEMENT Left 02/08/2019   Procedure: INSERTION PORT-A-CATH;  Surgeon: Virl Cagey, MD;  Location: [facility];  Service: General;  Laterality: Left;   RECTAL BIOPSY  01/26/2019   Procedure: BIOPSY OF ANAL MASS;  Surgeon: Virl Cagey, MD;  Location: [facility];  Service: General;;   RECTAL BIOPSY N/A 11/13/2019   Procedure: BIOPSY ANAL AREA;  Surgeon: Virl Cagey, MD;  Location: [facility];  Service: General;  Laterality: N/A;   RECTAL EXAM UNDER ANESTHESIA N/A 01/26/2019   Procedure: RECTAL EXAM UNDER ANESTHESIA;  Surgeon: Virl Cagey, MD;  Location: [facility];  Service: General;  Laterality: N/A;   RECTAL EXAM UNDER ANESTHESIA  N/A 11/13/2019   Procedure: RECTAL EXAM UNDER ANESTHESIA  WITH ANAL BIOPSY;  Surgeon: Virl Cagey, MD;  Location: [facility];  Service: General;  Laterality: N/A;   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 03/01/2020   Procedure: REPAIR OF COMPLEX TRACTION RETINAL DETACHMENT;  Surgeon: Jalene Mullet, MD;  Location: [facility];  Service: Ophthalmology;  Laterality: Left;   RETINAL DETACHMENT SURGERY Bilateral 03/11/2020    SOCIAL HISTORY:  Social History   Socioeconomic History   Marital status: Single     Spouse name: Not on file   Number of children: 2   Years of education: Not on file   Highest education level: Not on file  Occupational History   Occupation: retired  Tobacco Use   Smoking status: Never   Smokeless tobacco: Never  Vaping Use   Vaping Use: Never used  Substance and Sexual Activity   Alcohol use: Not Currently   Drug use: Never   Sexual activity: Not Currently  Other Topics Concern   Not on file  Social History Narrative   Pt is retired and currently engaged.   Social Determinants of Health   Financial Resource Strain: Medium Risk (02/06/2019)   Overall Financial Resource Strain (CARDIA)    Difficulty of Paying Living Expenses: Somewhat hard  Food Insecurity: No Food Insecurity (02/06/2019)   Hunger Vital Sign    Worried About Running Out of Food in the Last Year: Never true    Ran Out of Food in the Last Year: Never true  Transportation Needs: No Transportation Needs (02/06/2019)   PRAPARE - Hydrologist (Medical): No    Lack of Transportation (Non-Medical): No  Physical Activity: Sufficiently Active (02/06/2019)   Exercise Vital Sign    Days of Exercise per Week: 7 days    Minutes of Exercise per Session: 60 min  Stress: Stress Concern Present (02/06/2019)   [place]    Feeling of Stress : To some extent  Social Connections: Moderately Integrated (02/06/2019)   Social Connection and Isolation Panel [NHANES]    Frequency of Communication with Friends and Family: Three times a week    Frequency of Social Gatherings with Friends and Family: Three times a week    Attends Religious Services: 1 to 4 times per year    Active Member of Clubs or Organizations: Yes    Attends Archivist Meetings: 1 to 4 times per year    Marital Status: Separated  Intimate Partner Violence: Not At Risk (02/06/2019)   Humiliation, Afraid, Rape, and Kick questionnaire    Fear of  Current or Ex-Partner: No    Emotionally Abused: No    Physically Abused: No    Sexually Abused: No    FAMILY HISTORY:  Family History  Problem Relation Age of Onset   Cataracts Mother    Glaucoma Mother    Heart disease Mother    Diabetes Father    Heart attack Father    Cataracts Sister    Lung disease Sister    Cataracts Brother    Diabetes Brother    Heart disease Brother    Macular degeneration Maternal Aunt    Diabetes Maternal Grandfather    Heart disease Sister    Diabetes Sister    Throat cancer Son    Breast cancer Niece    Breast cancer Niece    Amblyopia Neg Hx    Blindness Neg Hx  Retinal detachment Neg Hx    Strabismus Neg Hx    Retinitis pigmentosa Neg Hx     CURRENT MEDICATIONS:  Current Outpatient Medications  Medication Sig Dispense Refill   acetaminophen (TYLENOL) 500 MG tablet Take 500-1,000 mg by mouth every 6 (six) hours as needed (for pain.).     ALPRAZolam (XANAX) 0.5 MG tablet TAKE 1 TABLET BY MOUTH AT BEDTIME AS NEEDED FOR ANXIETY / SLEEP 30 tablet 5   atorvastatin (LIPITOR) 80 MG tablet Take 80 mg by mouth every evening.      Cholecalciferol (VITAMIN D) 125 MCG (5000 UT) CAPS Take 5,000 Units by mouth daily.     ELIQUIS 5 MG TABS tablet Take 5 mg by mouth 2 (two) times daily.     FARXIGA 10 MG TABS tablet Take 10 mg by mouth daily.     furosemide (LASIX) 20 MG tablet Take 20 mg by mouth daily.     gabapentin (NEURONTIN) 100 MG capsule Take 100-200 mg by mouth See admin instructions. Take 2 capsules (200 mg) by mouth in the morning and take 1 capsule ('100mg'$ ) at night.     methocarbamol (ROBAXIN) 500 MG tablet Take 1 tablet (500 mg total) by mouth every 8 (eight) hours as needed for muscle spasms. 20 tablet 0   NOVOLOG FLEXPEN 100 UNIT/ML FlexPen Inject 4-6 Units into the skin in the morning, at noon, and at bedtime.      predniSONE (STERAPRED UNI-PAK 21 TAB) 10 MG (21) TBPK tablet Take by mouth daily. Take 6 tabs by mouth daily  for 1 day,  then 5 tabs for 1 day, then 4 tabs for 1 day, then 3 tabs for 1 day, 2 tabs for 1 day, then 1 tab by mouth daily for 1 day 21 tablet 0   sertraline (ZOLOFT) 50 MG tablet Take 100 mg by mouth daily.     tiZANidine (ZANAFLEX) 4 MG tablet Take 4 mg by mouth every 8 (eight) hours as needed.     traZODone (DESYREL) 50 MG tablet Take 50 mg by mouth at bedtime.     TRESIBA FLEXTOUCH 200 UNIT/ML SOPN Inject 20 Units into the skin daily before breakfast. (Patient taking differently: Inject 26 Units into the skin daily before breakfast.)     TRULICITY 2.70 JJ/0.0XF SOPN Inject 0.75 mg into the muscle every Sunday.      vitamin B-12 (CYANOCOBALAMIN) 1000 MCG tablet Take 2,000 mcg by mouth daily.     No current facility-administered medications for this visit.    ALLERGIES:  Allergies  Allergen Reactions   Sulfa Antibiotics Anaphylaxis and Swelling    Per pt, can use creams with sulfa in it    PHYSICAL EXAM:  Performance status (ECOG): 1 - Symptomatic but completely ambulatory  Vitals:   02/10/22 1130  BP: 137/73  Pulse: 71  Resp: 18  Temp: 97.9 F (36.6 C)  SpO2: 96%   Wt Readings from Last 3 Encounters:  02/10/22 174 lb (78.9 kg)  11/17/21 184 lb 11.9 oz (83.8 kg)  06/24/21 184 lb 11.2 oz (83.8 kg)   Physical Exam Vitals reviewed.  Constitutional:      Appearance: Normal appearance.  Cardiovascular:     Rate and Rhythm: Normal rate and regular rhythm.     Pulses: Normal pulses.     Heart sounds: Normal heart sounds.  Pulmonary:     Effort: Pulmonary effort is normal.     Breath sounds: Normal breath sounds.  Genitourinary:    Rectum: Normal.  No mass or tenderness.  Skin:    Findings: No lesion.  Neurological:     General: No focal deficit present.     Mental Status: She is alert and oriented to person, place, and time.  Psychiatric:        Mood and Affect: Mood normal.        Behavior: Behavior normal.      LABORATORY DATA:  I have reviewed the labs as listed.      Latest Ref Rng & Units 02/02/2022   12:52 PM 05/07/2021    1:17 PM 10/28/2020    1:21 PM  CBC  WBC 4.0 - 10.5 K/uL 5.8  5.0  5.8   Hemoglobin 12.0 - 15.0 g/dL 14.0  14.0  14.1   Hematocrit 36.0 - 46.0 % 43.7  44.1  42.9   Platelets 150 - 400 K/uL 229  218  223       Latest Ref Rng & Units 02/02/2022   12:52 PM 05/07/2021    1:17 PM 10/28/2020    1:21 PM  CMP  Glucose 70 - 99 mg/dL 126  122  145   BUN 8 - 23 mg/dL '23  16  15   '$ Creatinine 0.44 - 1.00 mg/dL 0.68  0.59  0.54   Sodium 135 - 145 mmol/L 140  141  137   Potassium 3.5 - 5.1 mmol/L 4.0  3.7  4.3   Chloride 98 - 111 mmol/L 105  104  104   CO2 22 - 32 mmol/L '27  31  27   '$ Calcium 8.9 - 10.3 mg/dL 9.3  9.3  8.9   Total Protein 6.5 - 8.1 g/dL 7.2  7.3  7.1   Total Bilirubin 0.3 - 1.2 mg/dL 0.7  0.5  0.5   Alkaline Phos 38 - 126 U/L 78  58  66   AST 15 - 41 U/L '20  20  18   '$ ALT 0 - 44 U/L '17  19  19     '$ DIAGNOSTIC IMAGING:  I have independently reviewed the scans and discussed with the patient. CT Abdomen Pelvis W Contrast  Result Date: 02/04/2022 CLINICAL DATA:  Follow-up anal carcinoma. Previous chemotherapy and radiation therapy. Surveillance. EXAM: CT ABDOMEN AND PELVIS WITH CONTRAST TECHNIQUE: Multidetector CT imaging of the abdomen and pelvis was performed using the standard protocol following bolus administration of intravenous contrast. RADIATION DOSE REDUCTION: This exam was performed according to the departmental dose-optimization program which includes automated exposure control, adjustment of the mA and/or kV according to patient size and/or use of iterative reconstruction technique. CONTRAST:  110m OMNIPAQUE IOHEXOL 300 MG/ML  SOLN COMPARISON:  10/25/2020 FINDINGS: Lower Chest: No acute findings. Hepatobiliary: No hepatic masses identified. Gallbladder is unremarkable. No evidence of biliary ductal dilatation. Pancreas:  No mass or inflammatory changes. Spleen: Within normal limits in size and appearance.  Adrenals/Urinary Tract: No suspicious masses identified. No evidence of ureteral calculi or hydronephrosis. Stomach/Bowel: Mild wall thickening the distal rectum and anus remains stable. No focal mass visualized. No evidence of obstruction, inflammatory process or abnormal fluid collections. Normal appendix visualized. Vascular/Lymphatic: No pathologically enlarged lymph nodes. No acute vascular findings. Aortic atherosclerotic calcification incidentally noted. Reproductive:  No mass or other significant abnormality. Other:  None. Musculoskeletal:  No suspicious bone lesions identified. IMPRESSION: Stable mild wall thickening the distal rectum and anus. No evidence of recurrent or metastatic carcinoma within the abdomen or pelvis. Aortic Atherosclerosis (ICD10-I70.0). Electronically Signed   By: JMyles RosenthalD.  On: 02/04/2022 13:14     ASSESSMENT:  1.  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 -PET scan on 10/23/2019 showed no specific worrisome recurrent hypermetabolic anal activity. Maximum SUV in the anal region was 4.1, previously 4.3. No recurrent pathologically enlarged or hypermetabolic inguinal or external iliac lymph nodes. No metastatic spread. -Biopsy of the anal ulcer consistent with benign inflamed squamous mucosa with ulceration. -CTAP on 03/21/2020 with no acute process or evidence of metastatic disease in the abdomen or pelvis.  Lower rectal wall thickening is mild, most likely treatment related.  Mild cirrhosis.  2.  Recurrent thromboembolism: - Pulmonary embolism in 1973, status post Coumadin for 18 months. - Unprovoked left leg DVT on 11/27/2020, currently on Eliquis indefinitely.   PLAN:  1.  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 - CTAP (02/03/2022): Stable mild wall thickening of the distal rectum and anus with no evidence of  recurrence or metastatic disease. - Encourage follow-up with GI for colonoscopy. - RTC 6 months for follow-up with repeat labs and scan.   2.  Unprovoked left leg DVT: -Continue Eliquis indefinitely. - No bleeding reported.   3.  Difficulty falling asleep: - Could do Xanax at bedtime as needed.       Orders placed this encounter:  Orders Placed This Encounter  Procedures   CT Abdomen Pelvis W Contrast   CBC with Differential   Comprehensive metabolic panel      Derek Jack, MD [place] [phone]

## 2022-02-14 DIAGNOSIS — E782 Mixed hyperlipidemia: Secondary | ICD-10-CM | POA: Diagnosis not present

## 2022-02-14 DIAGNOSIS — E1165 Type 2 diabetes mellitus with hyperglycemia: Secondary | ICD-10-CM | POA: Diagnosis not present

## 2022-02-16 DIAGNOSIS — E1165 Type 2 diabetes mellitus with hyperglycemia: Secondary | ICD-10-CM | POA: Diagnosis not present

## 2022-02-16 DIAGNOSIS — R69 Illness, unspecified: Secondary | ICD-10-CM | POA: Diagnosis not present

## 2022-02-16 DIAGNOSIS — F418 Other specified anxiety disorders: Secondary | ICD-10-CM | POA: Diagnosis not present

## 2022-02-18 ENCOUNTER — Telehealth (HOSPITAL_COMMUNITY): Payer: Self-pay

## 2022-02-18 NOTE — Telephone Encounter (Signed)
Called pt to schedule appt with Dr Nehemiah Settle due to getting a referral for psychiatrist, pt states that she will just do the therapy with Coralyn Mark, doesn't want to do virtual.

## 2022-02-19 ENCOUNTER — Ambulatory Visit (HOSPITAL_COMMUNITY): Payer: Medicare HMO | Admitting: Clinical

## 2022-02-19 DIAGNOSIS — F331 Major depressive disorder, recurrent, moderate: Secondary | ICD-10-CM

## 2022-02-19 DIAGNOSIS — R69 Illness, unspecified: Secondary | ICD-10-CM | POA: Diagnosis not present

## 2022-02-19 DIAGNOSIS — F419 Anxiety disorder, unspecified: Secondary | ICD-10-CM

## 2022-02-19 IMAGING — PT NM PET TUM IMG RESTAG (PS) SKULL BASE T - THIGH
3 series · 23 of 25 positions shown · non-contrast
Comparison: PET-CT 02/05/2019.

CLINICAL DATA: Subsequent treatment strategy for anal cancer.

EXAM:
NUCLEAR MEDICINE PET SKULL BASE TO THIGH
TECHNIQUE: 11.2 mCi F-18 FDG was injected intravenously. Full-ring PET imaging
was performed from the skull base to thigh after the radiotracer. CT
data was obtained and used for attenuation correction and anatomic
localization.
Fasting blood glucose: 177 mg/dl

[axial ct wb fusion · 15 of 176 slices shown]
[im 1/176]
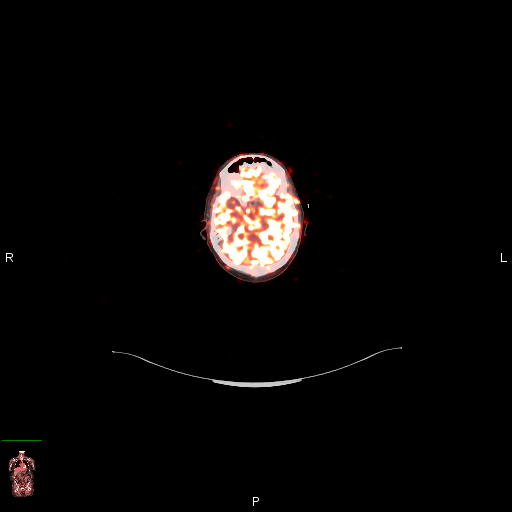
[im 12/176]
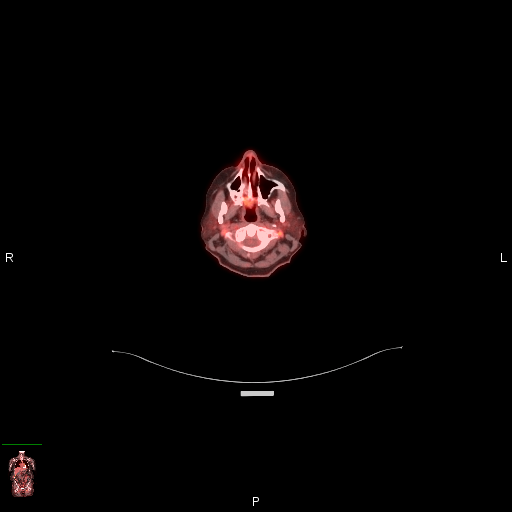
[im 24/176]
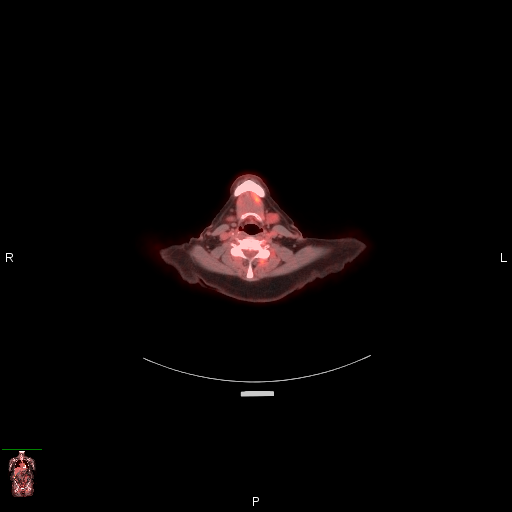
[im 36/176]
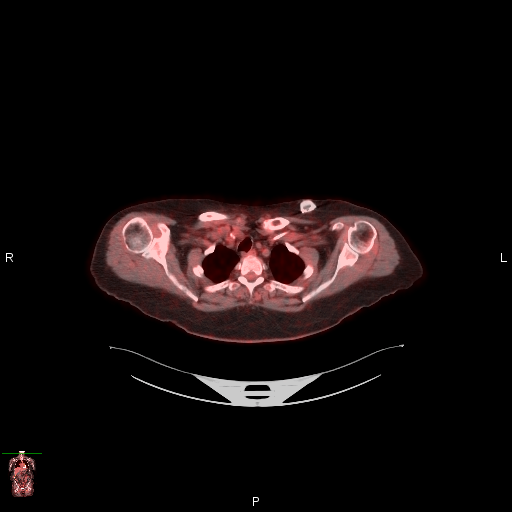
[im 47/176]
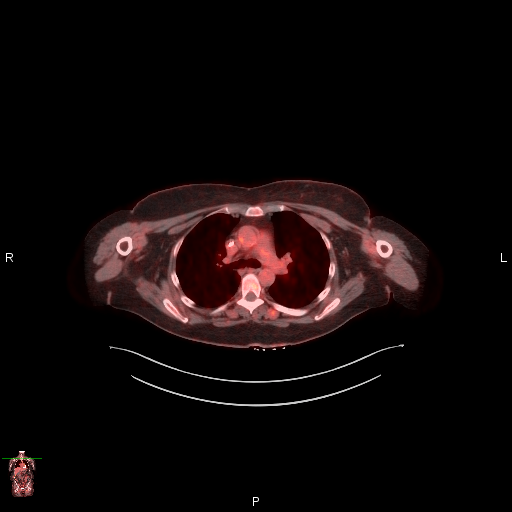
[im 59/176]
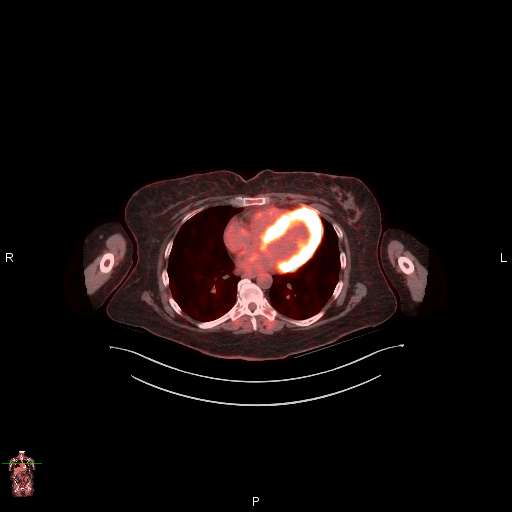
[im 82/176]
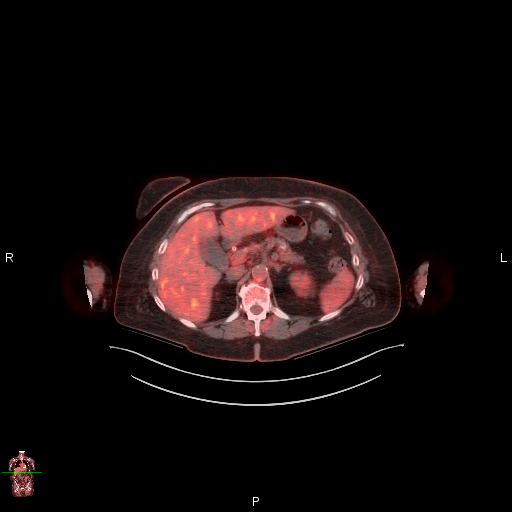
[im 94/176]
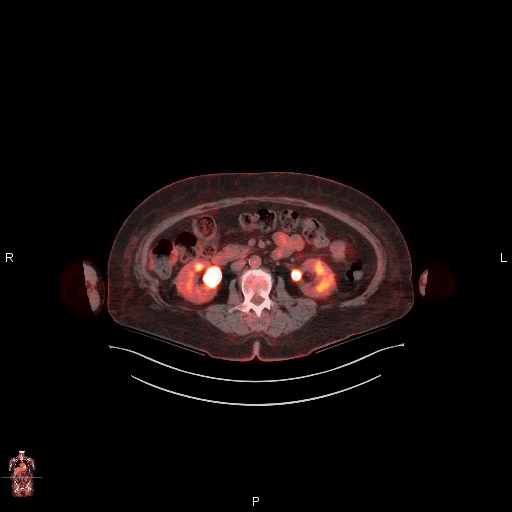
[im 106/176]
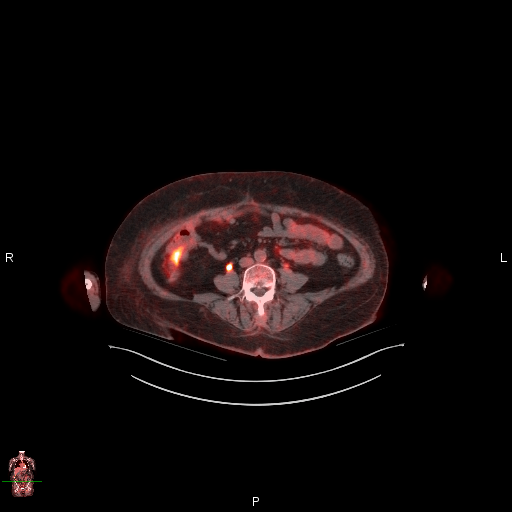
[im 117/176]
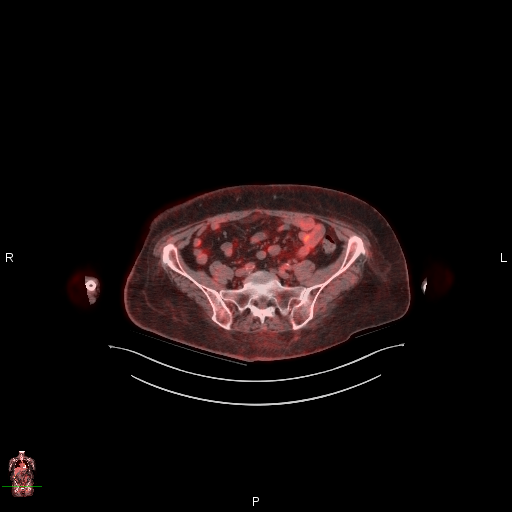
[im 129/176]
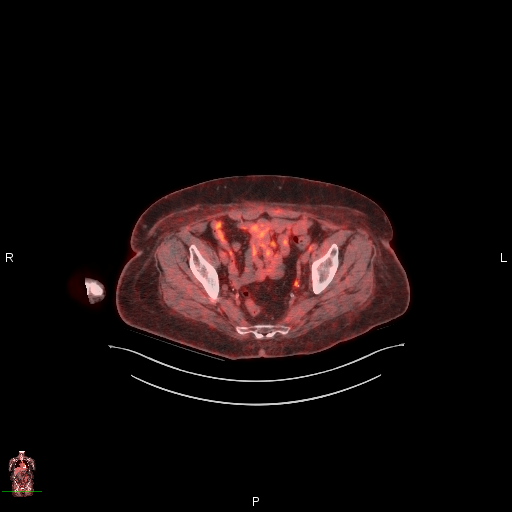
[im 141/176]
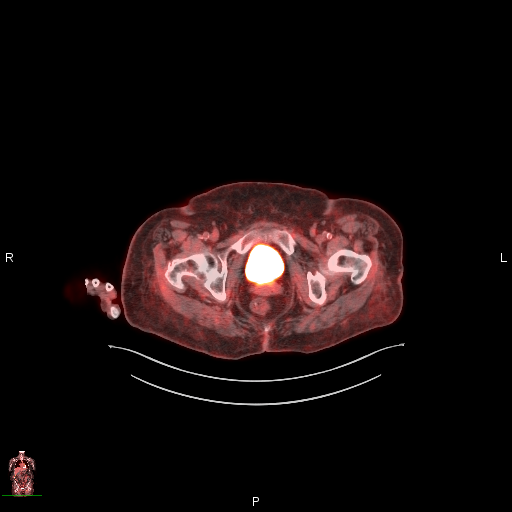
[im 152/176]
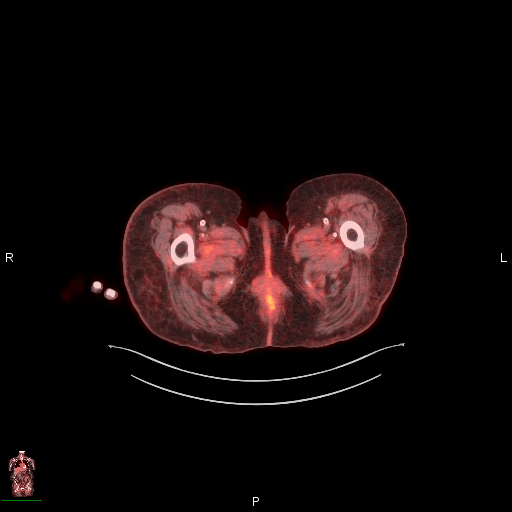
[im 164/176]
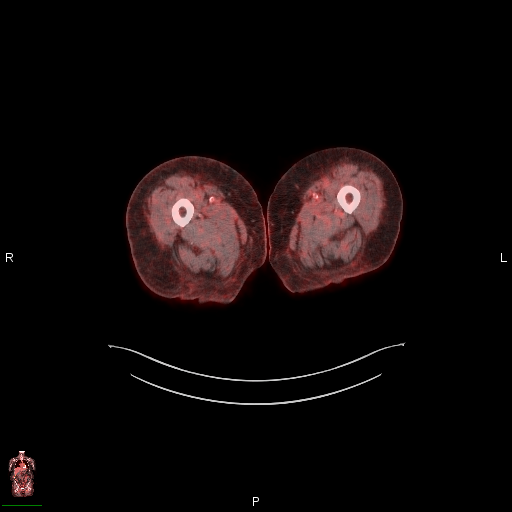
[im 176/176]
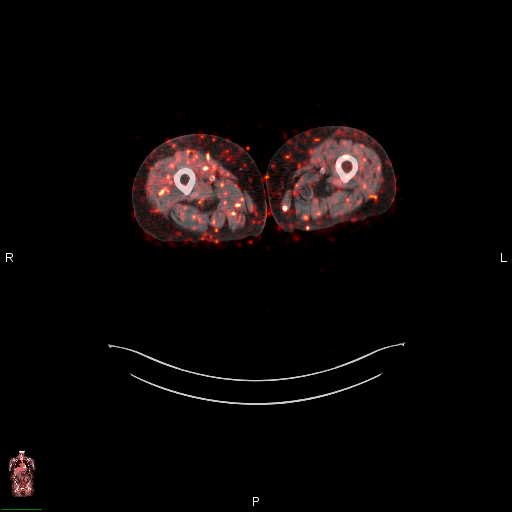

[coronal ct wb fusion · 3 of 39 slices shown]
[im 1/39]
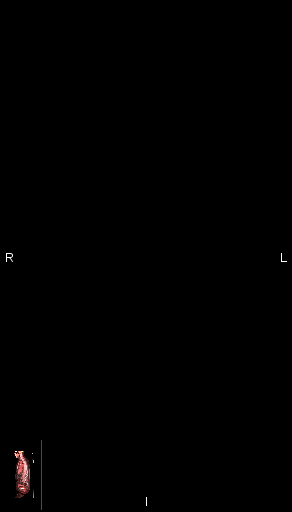
[im 13/39]
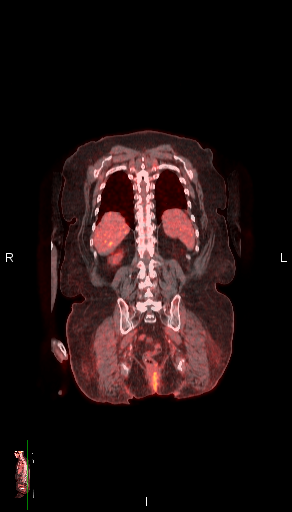
[im 39/39]
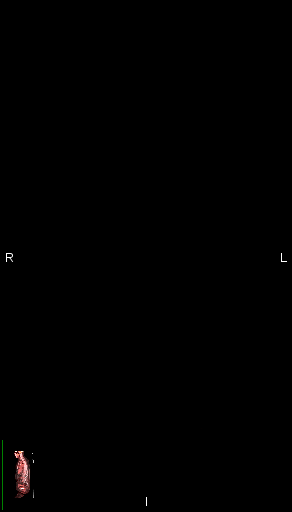

[mip · 5 of 48 slices shown]
[im 1/48]
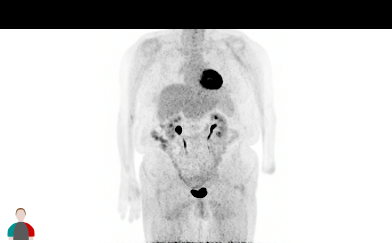
[im 12/48]
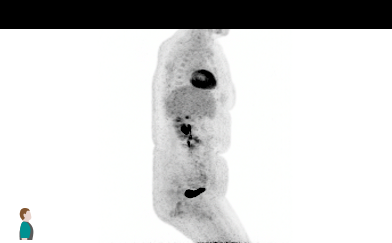
[im 24/48]
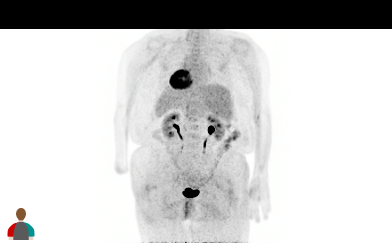
[im 36/48]
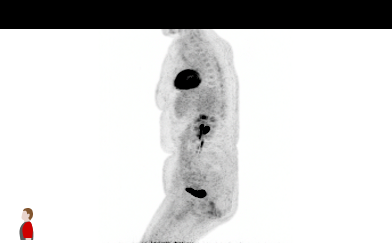
[im 48/48]
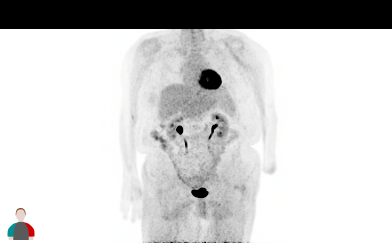

[23 of 25 positions shown; findings below may reference images not displayed]

FINDINGS: Mediastinal blood pool activity: SUV max

Liver activity: SUV max NA

NECK: No hypermetabolic lymph nodes in the neck.

Incidental CT findings: none

CHEST: No hypermetabolic mediastinal or hilar nodes. No suspicious
pulmonary nodules on the CT scan.

Incidental CT findings: Heart size is normal. There is no
significant pericardial fluid, thickening or pericardial
calcification. There is aortic atherosclerosis, as well as
atherosclerosis of the great vessels of the mediastinum and the
coronary arteries, including calcified atherosclerotic plaque in the
left anterior descending and right coronary arteries. Left
subclavian single-lumen porta cath with tip terminating at the
superior cavoatrial junction.

ABDOMEN/PELVIS: Small focal area of hypermetabolism associated with
the anus (SUVmax = 4.3). No abnormal hypermetabolic activity within
the liver, pancreas, adrenal glands, or spleen. No hypermetabolic
lymph nodes in the abdomen or pelvis.

Incidental CT findings: Aortic atherosclerosis.  Normal appendix.

SKELETON: No focal hypermetabolic activity to suggest skeletal
metastasis.

Incidental CT findings: Multiple old healed left-sided rib fractures
are incidentally noted.
IMPRESSION: 1. Positive response to therapy with regression of previously noted
lymphadenopathy, and decreased hypermetabolism of the primary anal
lesion, as detailed above. No new sites of metastatic disease noted
elsewhere on today's examination.
2. Aortic atherosclerosis, in addition to 2 vessel coronary artery
disease. Please note that although the presence of coronary artery
calcium documents the presence of coronary artery disease, the
severity of this disease and any potential stenosis cannot be
assessed on this non-gated CT examination. Assessment for potential
risk factor modification, dietary therapy or pharmacologic therapy
may be warranted, if clinically indicated.

## 2022-02-19 NOTE — Progress Notes (Signed)
IN PERSON  I connected with Jacqueline Bird on 02/19/22 at 10:00 AM EDT in person and verified that I am speaking with the correct person using two identifiers.  Location: Patient: Home Provider: Office   I discussed the limitations of evaluation and management by telemedicine and the availability of in person appointments. The patient expressed understanding and agreed to proceed.   THERAPIST PROGRESS NOTE     Session Time: 10:00 AM-10:55 AM   Participation Level: Active   Behavioral Response: Casual and Alert,Depressed   Type of Therapy: Individual Therapy   Treatment Goals addressed: Depression and Anxiety   Interventions: CBT   Summary: Jacqueline Bird is a 71 y.o. female who presents with Depression with Anxiety. The OPT therapist worked with the patient for her ongoing OPT treatment. The OPT therapist utilized Motivational Interviewing to assist in creating therapeutic repore. The patient in the session was engaged and work in collaboration giving feedback about her triggers and symptoms over the past few weeks. The patient spoke about her ongoing struggle with not sleeping due to her involvement in tasks around the house. The OPT therapist utilized Cognitive Behavioral Therapy through cognitive restructuring as well as worked with the patient on coping strategies to assist in management of mood. The OPT therapist overviewed in session with the patient basic need areas examining the patients current eating habits, sleep schedule, exercise, and hygiene.  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 The family reunion will be at [facility] on October 14th.   Suicidal/Homicidal: Nowithout intent/plan   Therapist Response:The OPT therapist worked with the patient for the patients scheduled session. The patient was engaged in her session and gave feedback in relation to triggers,  symptoms, and behavior responses over the past few weeks. The OPT therapist worked with the patient utilizing an in session Cognitive Behavioral Therapy exercise. The patient was responsive in the session and verbalized, " My stressor recently and my focus has been getting ready for the upcoming family reunion as well as getting my house ready cause I will have guest that are staying that Friday and Saturday night". The patient spoke about her concern  of her health problems. The OPT therapist worked with the patient to challenge her negative thoughts and empower her to continue to work with her health providers in working her health problems and getting to desired outcomes..The OPT therapist will continue treatment work with the patient in her next scheduled session   Plan: Return again in 2/3 weeks.   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   Axis II: No diagnosis      Collaboration of Care: No additional collaboration of care for this session.   Patient/Guardian was advised Release of Information must be obtained prior to any record release in order to collaborate their care with an outside provider. Patient/Guardian was advised if they have not already done so to contact the registration department to sign all necessary forms in order for [place] to release information regarding their care.    Consent: Patient/Guardian gives verbal consent for treatment and assignment of benefits for services provided during this visit. Patient/Guardian expressed understanding and agreed to proceed.    I discussed the assessment and treatment plan with the patient. The patient was provided an opportunity to ask questions and all were answered. The patient agreed with the plan and  demonstrated an understanding of the instructions.   The patient was advised to call back or seek an in-person evaluation if the symptoms worsen or if the condition fails to improve as anticipated.    I provided 55 minutes of face-to-face time during this encounter.   Lennox Grumbles, LCSW   02/19/2022

## 2022-02-26 DIAGNOSIS — E119 Type 2 diabetes mellitus without complications: Secondary | ICD-10-CM | POA: Diagnosis not present

## 2022-02-26 DIAGNOSIS — E782 Mixed hyperlipidemia: Secondary | ICD-10-CM | POA: Diagnosis not present

## 2022-03-05 DIAGNOSIS — I82409 Acute embolism and thrombosis of unspecified deep veins of unspecified lower extremity: Secondary | ICD-10-CM | POA: Diagnosis not present

## 2022-03-05 DIAGNOSIS — K7460 Unspecified cirrhosis of liver: Secondary | ICD-10-CM | POA: Diagnosis not present

## 2022-03-05 DIAGNOSIS — R809 Proteinuria, unspecified: Secondary | ICD-10-CM | POA: Diagnosis not present

## 2022-03-05 DIAGNOSIS — E1165 Type 2 diabetes mellitus with hyperglycemia: Secondary | ICD-10-CM | POA: Diagnosis not present

## 2022-03-05 DIAGNOSIS — Z0000 Encounter for general adult medical examination without abnormal findings: Secondary | ICD-10-CM | POA: Diagnosis not present

## 2022-03-05 DIAGNOSIS — E785 Hyperlipidemia, unspecified: Secondary | ICD-10-CM | POA: Diagnosis not present

## 2022-03-05 DIAGNOSIS — E539 Vitamin B deficiency, unspecified: Secondary | ICD-10-CM | POA: Diagnosis not present

## 2022-03-05 DIAGNOSIS — R69 Illness, unspecified: Secondary | ICD-10-CM | POA: Diagnosis not present

## 2022-03-05 DIAGNOSIS — C211 Malignant neoplasm of anal canal: Secondary | ICD-10-CM | POA: Diagnosis not present

## 2022-03-05 DIAGNOSIS — I25119 Atherosclerotic heart disease of native coronary artery with unspecified angina pectoris: Secondary | ICD-10-CM | POA: Diagnosis not present

## 2022-03-05 DIAGNOSIS — E559 Vitamin D deficiency, unspecified: Secondary | ICD-10-CM | POA: Diagnosis not present

## 2022-03-05 DIAGNOSIS — G4700 Insomnia, unspecified: Secondary | ICD-10-CM | POA: Diagnosis not present

## 2022-03-11 ENCOUNTER — Ambulatory Visit (INDEPENDENT_AMBULATORY_CARE_PROVIDER_SITE_OTHER): Payer: Medicare HMO | Admitting: Clinical

## 2022-03-11 DIAGNOSIS — F419 Anxiety disorder, unspecified: Secondary | ICD-10-CM | POA: Diagnosis not present

## 2022-03-11 DIAGNOSIS — R69 Illness, unspecified: Secondary | ICD-10-CM | POA: Diagnosis not present

## 2022-03-11 DIAGNOSIS — F331 Major depressive disorder, recurrent, moderate: Secondary | ICD-10-CM | POA: Diagnosis not present

## 2022-03-11 NOTE — Progress Notes (Signed)
IN PERSON   I connected with Jacqueline Bird on 03/11/22 at 11:00 AM EDT in person and verified that I am speaking with the correct person using two identifiers.   Location: Patient: Home Provider: Office   I discussed the limitations of evaluation and management by telemedicine and the availability of in person appointments. The patient expressed understanding and agreed to proceed.     THERAPIST PROGRESS NOTE     Session Time: 11:00 AM-11:45 AM   Participation Level: Active   Behavioral Response: Casual and Alert,Depressed   Type of Therapy: Individual Therapy   Treatment Goals addressed: Depression and Anxiety   Interventions: CBT   Summary: Jacqueline Bird is a 71 y.o. female who presents with Depression with Anxiety. The OPT therapist worked with the patient for her ongoing OPT treatment. The OPT therapist utilized Motivational Interviewing to assist in creating therapeutic repore. The patient in the session was engaged and work in collaboration giving feedback about her triggers and symptoms over the past few weeks. The patient spoke about her family planned event in which she hosted at the [facility] for around 80+ family member from in and outside of [place]. The patient notes this event was a big success. The OPT therapist utilized Cognitive Behavioral Therapy through cognitive restructuring as well as worked with the patient on coping strategies to assist in management of mood. The OPT therapist overviewed in session with the patient basic need areas examining the patients current eating habits, sleep schedule, exercise, and hygiene.  The patient spoke about her relief now being done with the large family event and how much stress and effort went into coordinating a large scale family gathering. The patient spoke about looking forward next to the upcoming Holidays.   Suicidal/Homicidal: Nowithout intent/plan   Therapist Response:The OPT therapist worked with the  patient for the patients scheduled session. The patient was engaged in her session and gave feedback in relation to triggers, symptoms, and behavior responses over the past few weeks. The patient spoke about her interactions recently with her daughter as triggering and finding out her daughter is skipping Christmas gathering and going to [place]. The OPT therapist worked with the patient utilizing an in session Cognitive Behavioral Therapy exercise. The patient was responsive in the session and verbalized, " I tried not to take it personal but I really which she would value father being to gather over going on vacation". The patient spoke about her mood over the past few weeks. The OPT therapist worked with the patient to challenge her negative thoughts and empower her to continue to work with her health providers in working her health problems and getting to desired outcomes..The OPT therapist overviewed the with the patient her upcoming appointments as listed in her [place] .The OPT therapist will continue treatment work with the patient in her next scheduled session   Plan: Return again in 2/3 weeks.   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   Axis II: No diagnosis      Collaboration of Care: No additional collaboration of care for this session.   Patient/Guardian was advised Release of Information must be obtained prior to any record release in order to collaborate their care with an outside provider. Patient/Guardian was advised if they have not already done so to contact the registration department to sign all necessary forms in order for [place] to release information regarding their care.    Consent: Patient/Guardian gives  verbal consent for treatment and assignment of benefits for services provided during this visit. Patient/Guardian expressed understanding and agreed to proceed.    I discussed the assessment and treatment plan with the patient. The  patient was provided an opportunity to ask questions and all were answered. The patient agreed with the plan and demonstrated an understanding of the instructions.   The patient was advised to call back or seek an in-person evaluation if the symptoms worsen or if the condition fails to improve as anticipated.   I provided 45 minutes of face-to-face time during this encounter.   Lennox Grumbles, LCSW   03/11/2022

## 2022-03-20 DIAGNOSIS — E119 Type 2 diabetes mellitus without complications: Secondary | ICD-10-CM | POA: Diagnosis not present

## 2022-04-03 DIAGNOSIS — H6123 Impacted cerumen, bilateral: Secondary | ICD-10-CM | POA: Diagnosis not present

## 2022-04-15 ENCOUNTER — Ambulatory Visit (HOSPITAL_COMMUNITY): Payer: Medicare HMO | Admitting: Clinical

## 2022-05-25 ENCOUNTER — Ambulatory Visit (INDEPENDENT_AMBULATORY_CARE_PROVIDER_SITE_OTHER): Payer: Medicare HMO | Admitting: Gastroenterology

## 2022-05-25 ENCOUNTER — Encounter (INDEPENDENT_AMBULATORY_CARE_PROVIDER_SITE_OTHER): Payer: Self-pay | Admitting: Gastroenterology

## 2022-05-25 VITALS — BP 144/66 | HR 79 | Temp 98.1°F | Ht 64.5 in | Wt 180.6 lb

## 2022-05-25 DIAGNOSIS — Z85048 Personal history of other malignant neoplasm of rectum, rectosigmoid junction, and anus: Secondary | ICD-10-CM | POA: Diagnosis not present

## 2022-05-25 DIAGNOSIS — K7460 Unspecified cirrhosis of liver: Secondary | ICD-10-CM | POA: Diagnosis not present

## 2022-05-25 NOTE — Progress Notes (Addendum)
Referring Provider: Celene Squibb, MD Primary Care Physician:  Celene Squibb, MD Primary GI Physician: Jenetta Downer   Chief Complaint  Patient presents with   Cirrhosis    Follow up on cirrhosis.    HPI:   Jacqueline Bird is a 72 y.o. 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    Patient presenting today for follow up of cirrhosis.  Last seen February 2023, at that time, presenting as new patient for cirrhosis and history of anal cancer.  Anal Cancer: followed by Dr. Delton Coombes. Diagnosed in September 2020, stage IIIc. Chemotherapy started in oct 2020-nov 2020. Recent CT and previous PET scan without suspicion for metastasis, though there was mild rectal wall thickening noted that appeared to have improved on last CT A/P with contrast in June 2022. She has never had a colonoscopy.    Cirrhosis: newly diagnosed, CT A/P with contrast June 2022 with findings suspicious for mild hepatic cirrhosis, no focal liver lesions. Previous CT in nov 2021 with caudate lobe enlargement. Notably, LFTs have been WNL, with AST only slightly higher than ALT (20 and 19), last reviewable labs Dec 2022.    Recommended to proceed with Colonoscopy, recommended to do US liver elastography and other serologies to rule out underlying causes of cirrhosis, however, due to financial hardship, she preferred to hold off on [place] and labs for now. Recommended proceeding with [place], AFP, CMP, CBC INR and acute hep panel as soon as possible once she was able to financially, could hold off on other serologies for now.   Patient was lost to follow up for colonoscopy, no labs or [place] were completed, she did have basic labs done in September with another provider with normal CBC and CMP, plt count 229k.   Present:  She states she did not wish to do colonoscopy so she did not do it. She denies abdominal pain. She denies constipation or  diarrhea. Having a BM every 1-2 days. She did take some pepto bismol last week which  made her stools black. Denies rectal bleeding. No changes in appetite or weight loss. Denies any swelling to her abdomen or episodes of confusion. Denies changes to skin color or itching.   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   Cervical cancer ([place])    Depression    DVT (deep venous thrombosis) ([facility])    Essential hypertension    History of cervical cancer    Mixed hyperlipidemia    Type 2 diabetes mellitus ([place])     Past Surgical History:  Procedure Laterality Date   AIR/FLUID EXCHANGE Left 03/01/2020   Procedure: AIR/FLUID EXCHANGE;  Surgeon: Jalene Mullet, MD;  Location: [facility];  Service: Ophthalmology;  Laterality: Left;   CATARACT EXTRACTION, BILATERAL Right 05/2017   CERVICAL CONE BIOPSY     HAND AMPUTATION Left    INJECTION OF SILICONE OIL Left 26/20/3559   Procedure: INJECTION OF SILICONE OIL;  Surgeon: Jalene Mullet, MD;  Location: [facility];  Service: Ophthalmology;  Laterality: Left;   PARS PLANA VITRECTOMY Left 03/01/2020   Procedure: PARS PLANA VITRECTOMY WITH 25 GAUGE;  Surgeon: Jalene Mullet, MD;  Location: [facility];  Service: Ophthalmology;  Laterality: Left;  PHOTOCOAGULATION WITH LASER Left 03/01/2020   Procedure: PHOTOCOAGULATION WITH LASER;  Surgeon: Jalene Mullet, MD;  Location: [facility];  Service: Ophthalmology;  Laterality: Left;   PORTACATH PLACEMENT Left 02/08/2019   Procedure: INSERTION PORT-A-CATH;  Surgeon: Virl Cagey, MD;  Location[facility];  Service: General;  Laterality: Left;   RECTAL BIOPSY  01/26/2019   Procedure: BIOPSY OF ANAL MASS;  Surgeon: Virl Cagey, MD;  Location: [facility];  Service: General;;   RECTAL BIOPSY N/A 11/13/2019   Procedure: BIOPSY ANAL AREA;  Surgeon:  Virl Cagey, MD;  Location: [facility];  Service: General;  Laterality: N/A;   RECTAL EXAM UNDER ANESTHESIA N/A 01/26/2019   Procedure: RECTAL EXAM UNDER ANESTHESIA;  Surgeon: Virl Cagey, MD;  Location: [facility];  Service: General;  Laterality: N/A;   RECTAL EXAM UNDER ANESTHESIA N/A 11/13/2019   Procedure: RECTAL EXAM UNDER ANESTHESIA  WITH ANAL BIOPSY;  Surgeon: Virl Cagey, MD;  Location: [facility];  Service: General;  Laterality: N/A;   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 03/01/2020   Procedure: REPAIR OF COMPLEX TRACTION RETINAL DETACHMENT;  Surgeon: Jalene Mullet, MD;  Location: [facility];  Service: Ophthalmology;  Laterality: Left;   RETINAL DETACHMENT SURGERY Bilateral 03/11/2020    Current Outpatient Medications  Medication Sig Dispense Refill   acetaminophen (TYLENOL) 500 MG tablet Take 500-1,000 mg by mouth every 6 (six) hours as needed (for pain.).     ALPRAZolam (XANAX) 0.5 MG tablet TAKE 1 TABLET BY MOUTH AT BEDTIME AS NEEDED FOR ANXIETY / SLEEP 30 tablet 5   atorvastatin (LIPITOR) 80 MG tablet Take 80 mg by mouth every evening.      Cholecalciferol (VITAMIN D) 125 MCG (5000 UT) CAPS Take 5,000 Units by mouth daily.     ELIQUIS 5 MG TABS tablet Take 5 mg by mouth 2 (two) times daily.     FARXIGA 10 MG TABS tablet Take 10 mg by mouth daily.     furosemide (LASIX) 20 MG tablet Take 20 mg by mouth daily.     gabapentin (NEURONTIN) 100 MG capsule Take 100-200 mg by mouth See admin instructions. Take 2 capsules (200 mg) by mouth in the morning and take 1 capsule ('100mg'$ ) at night.     NOVOLOG FLEXPEN 100 UNIT/ML FlexPen Inject 4-6 Units into the skin in the morning, at noon, and at bedtime.      sertraline (ZOLOFT) 50 MG tablet Take 100 mg by mouth daily.     traZODone (DESYREL) 50 MG tablet Take 50 mg by mouth at bedtime.     TRESIBA FLEXTOUCH 200 UNIT/ML SOPN Inject 20 Units into the skin daily before breakfast. (Patient taking differently: Inject 26 Units  into the skin daily before breakfast.)     TRULICITY 9.41 DE/0.8XK SOPN Inject 0.75 mg into the muscle every Sunday.      No current facility-administered medications for this visit.    Allergies as of 05/25/2022 - Review Complete 05/25/2022  Allergen Reaction Noted   Sulfa antibiotics Anaphylaxis and Swelling 05/20/2017    Family History  Problem Relation Age of Onset   Cataracts Mother    Glaucoma Mother    Heart disease Mother    Diabetes Father    Heart attack Father    Cataracts Sister    Lung disease Sister    Cataracts Brother    Diabetes Brother    Heart disease Brother    Macular degeneration Maternal Aunt    Diabetes Maternal Grandfather  Heart disease Sister    Diabetes Sister    Throat cancer Son    Breast cancer Niece    Breast cancer Niece    Amblyopia Neg Hx    Blindness Neg Hx    Retinal detachment Neg Hx    Strabismus Neg Hx    Retinitis pigmentosa Neg Hx     Social History   Socioeconomic History   Marital status: Single    Spouse name: Not on file   Number of children: 2   Years of education: Not on file   Highest education level: Not on file  Occupational History   Occupation: retired  Tobacco Use   Smoking status: Never    Passive exposure: Past   Smokeless tobacco: Never  Vaping Use   Vaping Use: Never used  Substance and Sexual Activity   Alcohol use: Not Currently   Drug use: Never   Sexual activity: Not Currently  Other Topics Concern   Not on file  Social History Narrative   Pt is retired and currently engaged.   Social Determinants of Health   Financial Resource Strain: Medium Risk (02/06/2019)   Overall Financial Resource Strain (CARDIA)    Difficulty of Paying Living Expenses: Somewhat hard  Food Insecurity: No Food Insecurity (02/06/2019)   Hunger Vital Sign    Worried About Running Out of Food in the Last Year: Never true    Ran Out of Food in the Last Year: Never true  Transportation Needs: No Transportation Needs  (02/06/2019)   PRAPARE - Hydrologist (Medical): No    Lack of Transportation (Non-Medical): No  Physical Activity: Sufficiently Active (02/06/2019)   Exercise Vital Sign    Days of Exercise per Week: 7 days    Minutes of Exercise per Session: 60 min  Stress: Stress Concern Present (02/06/2019)   [place]    Feeling of Stress : To some extent  Social Connections: Moderately Integrated (02/06/2019)   Social Connection and Isolation Panel [NHANES]    Frequency of Communication with Friends and Family: Three times a week    Frequency of Social Gatherings with Friends and Family: Three times a week    Attends Religious Services: 1 to 4 times per year    Active Member of Clubs or Organizations: Yes    Attends Archivist Meetings: 1 to 4 times per year    Marital Status: Separated   Review of systems General: negative for malaise, night sweats, fever, chills, weight loss Neck: Negative for lumps, goiter, pain and significant neck swelling Resp: Negative for cough, wheezing, dyspnea at rest CV: Negative for chest pain, leg swelling, palpitations, orthopnea GI: denies melena, hematochezia, nausea, vomiting, diarrhea, constipation, dysphagia, odyonophagia, early satiety or unintentional weight loss.  MSK: Negative for joint pain or swelling, back pain, and muscle pain. Derm: Negative for itching or rash Psych: Denies depression, anxiety, memory loss, confusion. No homicidal or suicidal ideation.  Heme: Negative for prolonged bleeding, bruising easily, and swollen nodes. Endocrine: Negative for cold or heat intolerance, polyuria, polydipsia and goiter. Neuro: negative for tremor, gait imbalance, syncope and seizures. The remainder of the review of systems is noncontributory.  Physical Exam: BP (!) 144/66 (BP Location: Right Arm, Patient Position: Sitting, Cuff Size: Large)   Pulse 79    Temp 98.1 F (36.7 C) (Oral)   Ht 5' 4.5" (1.638 m)   Wt 180 lb 9.6 oz (81.9 kg)  BMI 30.52 kg/m  General:   Alert and oriented. No distress noted. Pleasant and cooperative.  Head:  Normocephalic and atraumatic. Eyes:  Conjuctiva clear without scleral icterus. Mouth:  Oral mucosa pink and moist. Good dentition. No lesions. Heart: Normal rate and rhythm, s1 and s2 heart sounds present.  Lungs: Clear lung sounds in all lobes. Respirations equal and unlabored. Abdomen:  +BS, soft, non-tender and non-distended. No rebound or guarding. No HSM or masses noted. Derm: No palmar erythema or jaundice Msk:  Symmetrical without gross deformities. Normal posture. Extremities:  Without edema. Neurologic:  Alert and  oriented x4 Psych:  Alert and cooperative. Normal mood and affect.  Invalid input(s): "6 MONTHS"   ASSESSMENT: Jacqueline Bird is a 72 y.o. female presenting today for follow up of cirrhosis.  Cirrhosis:noted on CT imaging in 2022, discussed Importance of routine monitoring with labs and imaging every 6 months at last OV, as well as importance of other serologies to rule out acute hepatitis, AIH, iron overload or other underlying causes of cirrhosis vs NASH, however, due to financial hardship at that time she preferred to hold off on anything other than MELD labs, AFP and US imaging, though these were never completed. Denies ascites, jaundice, pruritus or episodes of confusion. unable to calculate MELD due to no previous INR value though Labs in September with plt count of 229k, LFTs WNL, appears well compensated. I discussed again with her the indications of cirrhosis and importance of routine 6 month [place] and labs as cirrhosis increases the risks of [place]. With initial diagnosis, it is important to have dedicated serologies to rule out underlying causes. At this time she still wishes to do only Imaging and most pertinent liver labs, given ongoing financial burden from previous anal cancer. will  proceed with AFP, INR and Acute hep panel as she had recent CBC and CMP,   History of anal cancer: previously recommended to have colonoscopy given her history and the fact she has never undergone colon cancer screening, seen in feb 2023 and recommended to have one however she was lost to follow up. I encouraged her again today to set this up to which she is amenable. Indications, risks and benefits of procedure discussed in detail with patient. Patient verbalized understanding and is in agreement to proceed with Colonoscopy at this time.   The patient was found to have elevated blood pressure when vital signs were checked in the office. The blood pressure was rechecked by the nursing staff and it was found be persistently elevated >140/90 mmHg. I personally advised to the patient to follow up closely with PCP for hypertension control.   PLAN:  -Liver US  - AFP, INR, Acute Hep panel (recommend further serologies once financially able) -Colonoscopy ASA III ENDO 3, on eliquis which will need to be held  - Reduce salt intake to <2 g per day - Can take Tylenol max of 2 g per day (650 mg q8h) for pain - Avoid NSAIDs for pain - Avoid eating raw oysters/shellfish - Ensure every night before going to sleep -discuss HTN with PCP  All questions were answered, patient verbalized understanding and is in agreement with plan as outlined above.   Follow Up: 6 months   Aarron Wierzbicki L. Alver Sorrow, MSN, APRN, AGNP-C Adult-Gerontology Nurse Practitioner [facility]  I have reviewed the note and agree with the APP's assessment as described in this progress note  Maylon Peppers, MD Gastroenterology and Hepatology [facility] Gastroenterology

## 2022-05-25 NOTE — Patient Instructions (Signed)
-  We will get you scheduled for colonoscopy given your history of anal cancer and no previous screenings -I will order basic liver related labs and [place] at this time, can hold off on further work up due to financial issues for now though it is important to stay up to date with liver related labs and imaging every 6 months  - Reduce salt intake to <2 g per day - Can take Tylenol max of 2 g per day (650 mg q8h) for pain - Avoid NSAIDs for pain - Avoid eating raw oysters/shellfish - Ensure every night before going to sleep   Follow up 6 months

## 2022-05-26 ENCOUNTER — Encounter: Payer: Self-pay | Admitting: *Deleted

## 2022-05-26 ENCOUNTER — Telehealth: Payer: Self-pay | Admitting: *Deleted

## 2022-05-26 MED ORDER — PEG 3350-KCL-NA BICARB-NACL 420 G PO SOLR: 4000.0000 mL | Freq: Once | ORAL | 0 refills | Status: AC

## 2022-05-26 NOTE — Telephone Encounter (Signed)
CALLED PT. Scheduled for TCS with Dr. Jenetta Downer, asa 3 on 2/2 at 11:15am. [facility] per pt request. Instructions/pre-op appt will be mailed to pt.

## 2022-05-27 DIAGNOSIS — K7460 Unspecified cirrhosis of liver: Secondary | ICD-10-CM | POA: Diagnosis not present

## 2022-05-29 LAB — AFP TUMOR MARKER: AFP-Tumor Marker: 4.4 ng/mL

## 2022-05-29 LAB — HEPATITIS PANEL, ACUTE
Hep A IgM: NONREACTIVE
Hep B C IgM: NONREACTIVE
Hepatitis B Surface Ag: NONREACTIVE
Hepatitis C Ab: NONREACTIVE

## 2022-05-29 LAB — PROTIME-INR
INR: 1.1
Prothrombin Time: 11.3 s (ref 9.0–11.5)

## 2022-06-12 DIAGNOSIS — E119 Type 2 diabetes mellitus without complications: Secondary | ICD-10-CM | POA: Diagnosis not present

## 2022-06-14 ENCOUNTER — Inpatient Hospital Stay (HOSPITAL_COMMUNITY)
Admission: EM | Admit: 2022-06-14 | Discharge: 2022-06-23 | DRG: 481 | Disposition: A | Discharge status: Skilled Nursing Facility | Payer: Medicare HMO | Attending: Family Medicine | Admitting: Family Medicine

## 2022-06-14 ENCOUNTER — Other Ambulatory Visit: Payer: Self-pay

## 2022-06-14 ENCOUNTER — Inpatient Hospital Stay (HOSPITAL_COMMUNITY): Payer: Medicare HMO

## 2022-06-14 ENCOUNTER — Emergency Department (HOSPITAL_COMMUNITY): Payer: Medicare HMO

## 2022-06-14 ENCOUNTER — Encounter (HOSPITAL_COMMUNITY): Payer: Self-pay

## 2022-06-14 DIAGNOSIS — E46 Unspecified protein-calorie malnutrition: Secondary | ICD-10-CM | POA: Diagnosis not present

## 2022-06-14 DIAGNOSIS — R339 Retention of urine, unspecified: Secondary | ICD-10-CM | POA: Diagnosis not present

## 2022-06-14 DIAGNOSIS — Z83511 Family history of glaucoma: Secondary | ICD-10-CM

## 2022-06-14 DIAGNOSIS — D62 Acute posthemorrhagic anemia: Secondary | ICD-10-CM | POA: Diagnosis not present

## 2022-06-14 DIAGNOSIS — Z881 Allergy status to other antibiotic agents status: Secondary | ICD-10-CM

## 2022-06-14 DIAGNOSIS — I509 Heart failure, unspecified: Secondary | ICD-10-CM | POA: Diagnosis not present

## 2022-06-14 DIAGNOSIS — Z79899 Other long term (current) drug therapy: Secondary | ICD-10-CM | POA: Diagnosis not present

## 2022-06-14 DIAGNOSIS — N179 Acute kidney failure, unspecified: Secondary | ICD-10-CM | POA: Diagnosis not present

## 2022-06-14 DIAGNOSIS — Z882 Allergy status to sulfonamides status: Secondary | ICD-10-CM

## 2022-06-14 DIAGNOSIS — F419 Anxiety disorder, unspecified: Secondary | ICD-10-CM | POA: Diagnosis not present

## 2022-06-14 DIAGNOSIS — I10 Essential (primary) hypertension: Secondary | ICD-10-CM | POA: Diagnosis present

## 2022-06-14 DIAGNOSIS — S72122A Displaced fracture of lesser trochanter of left femur, initial encounter for closed fracture: Secondary | ICD-10-CM | POA: Diagnosis not present

## 2022-06-14 DIAGNOSIS — S72142A Displaced intertrochanteric fracture of left femur, initial encounter for closed fracture: Secondary | ICD-10-CM | POA: Diagnosis not present

## 2022-06-14 DIAGNOSIS — Z7901 Long term (current) use of anticoagulants: Secondary | ICD-10-CM

## 2022-06-14 DIAGNOSIS — Z803 Family history of malignant neoplasm of breast: Secondary | ICD-10-CM

## 2022-06-14 DIAGNOSIS — F418 Other specified anxiety disorders: Secondary | ICD-10-CM | POA: Diagnosis not present

## 2022-06-14 DIAGNOSIS — F32A Depression, unspecified: Secondary | ICD-10-CM | POA: Diagnosis not present

## 2022-06-14 DIAGNOSIS — Y92009 Unspecified place in unspecified non-institutional (private) residence as the place of occurrence of the external cause: Secondary | ICD-10-CM | POA: Diagnosis not present

## 2022-06-14 DIAGNOSIS — I5032 Chronic diastolic (congestive) heart failure: Secondary | ICD-10-CM | POA: Diagnosis not present

## 2022-06-14 DIAGNOSIS — Z8249 Family history of ischemic heart disease and other diseases of the circulatory system: Secondary | ICD-10-CM | POA: Diagnosis not present

## 2022-06-14 DIAGNOSIS — S72002A Fracture of unspecified part of neck of left femur, initial encounter for closed fracture: Secondary | ICD-10-CM | POA: Diagnosis not present

## 2022-06-14 DIAGNOSIS — I82402 Acute embolism and thrombosis of unspecified deep veins of left lower extremity: Secondary | ICD-10-CM | POA: Diagnosis present

## 2022-06-14 DIAGNOSIS — E782 Mixed hyperlipidemia: Secondary | ICD-10-CM | POA: Diagnosis not present

## 2022-06-14 DIAGNOSIS — Z923 Personal history of irradiation: Secondary | ICD-10-CM

## 2022-06-14 DIAGNOSIS — Z833 Family history of diabetes mellitus: Secondary | ICD-10-CM

## 2022-06-14 DIAGNOSIS — E1165 Type 2 diabetes mellitus with hyperglycemia: Secondary | ICD-10-CM | POA: Diagnosis present

## 2022-06-14 DIAGNOSIS — Z808 Family history of malignant neoplasm of other organs or systems: Secondary | ICD-10-CM

## 2022-06-14 DIAGNOSIS — E669 Obesity, unspecified: Secondary | ICD-10-CM | POA: Diagnosis not present

## 2022-06-14 DIAGNOSIS — Z87892 Personal history of anaphylaxis: Secondary | ICD-10-CM

## 2022-06-14 DIAGNOSIS — C210 Malignant neoplasm of anus, unspecified: Secondary | ICD-10-CM | POA: Diagnosis present

## 2022-06-14 DIAGNOSIS — Z7984 Long term (current) use of oral hypoglycemic drugs: Secondary | ICD-10-CM

## 2022-06-14 DIAGNOSIS — I82409 Acute embolism and thrombosis of unspecified deep veins of unspecified lower extremity: Secondary | ICD-10-CM | POA: Insufficient documentation

## 2022-06-14 DIAGNOSIS — Z66 Do not resuscitate: Secondary | ICD-10-CM | POA: Diagnosis not present

## 2022-06-14 DIAGNOSIS — E8809 Other disorders of plasma-protein metabolism, not elsewhere classified: Secondary | ICD-10-CM | POA: Diagnosis present

## 2022-06-14 DIAGNOSIS — E441 Mild protein-calorie malnutrition: Secondary | ICD-10-CM | POA: Diagnosis present

## 2022-06-14 DIAGNOSIS — Z89212 Acquired absence of left upper limb below elbow: Secondary | ICD-10-CM

## 2022-06-14 DIAGNOSIS — Z8541 Personal history of malignant neoplasm of cervix uteri: Secondary | ICD-10-CM

## 2022-06-14 DIAGNOSIS — Z86718 Personal history of other venous thrombosis and embolism: Secondary | ICD-10-CM | POA: Diagnosis not present

## 2022-06-14 DIAGNOSIS — Z6830 Body mass index (BMI) 30.0-30.9, adult: Secondary | ICD-10-CM | POA: Diagnosis not present

## 2022-06-14 DIAGNOSIS — K5900 Constipation, unspecified: Secondary | ICD-10-CM | POA: Diagnosis not present

## 2022-06-14 DIAGNOSIS — E119 Type 2 diabetes mellitus without complications: Secondary | ICD-10-CM | POA: Diagnosis not present

## 2022-06-14 DIAGNOSIS — Z85048 Personal history of other malignant neoplasm of rectum, rectosigmoid junction, and anus: Secondary | ICD-10-CM

## 2022-06-14 DIAGNOSIS — K7460 Unspecified cirrhosis of liver: Secondary | ICD-10-CM | POA: Diagnosis present

## 2022-06-14 DIAGNOSIS — I110 Hypertensive heart disease with heart failure: Secondary | ICD-10-CM | POA: Diagnosis present

## 2022-06-14 DIAGNOSIS — R748 Abnormal levels of other serum enzymes: Secondary | ICD-10-CM | POA: Diagnosis not present

## 2022-06-14 DIAGNOSIS — W1831XA Fall on same level due to stepping on an object, initial encounter: Secondary | ICD-10-CM | POA: Diagnosis not present

## 2022-06-14 DIAGNOSIS — S72142K Displaced intertrochanteric fracture of left femur, subsequent encounter for closed fracture with nonunion: Secondary | ICD-10-CM | POA: Diagnosis not present

## 2022-06-14 DIAGNOSIS — S79912A Unspecified injury of left hip, initial encounter: Secondary | ICD-10-CM | POA: Diagnosis not present

## 2022-06-14 DIAGNOSIS — Z7985 Long-term (current) use of injectable non-insulin antidiabetic drugs: Secondary | ICD-10-CM

## 2022-06-14 DIAGNOSIS — S72142G Displaced intertrochanteric fracture of left femur, subsequent encounter for closed fracture with delayed healing: Secondary | ICD-10-CM | POA: Diagnosis not present

## 2022-06-14 DIAGNOSIS — Z794 Long term (current) use of insulin: Secondary | ICD-10-CM | POA: Diagnosis not present

## 2022-06-14 DIAGNOSIS — Z043 Encounter for examination and observation following other accident: Secondary | ICD-10-CM | POA: Diagnosis not present

## 2022-06-14 DIAGNOSIS — R69 Illness, unspecified: Secondary | ICD-10-CM | POA: Diagnosis not present

## 2022-06-14 LAB — BASIC METABOLIC PANEL
Anion gap: 8 (ref 5–15)
BUN: 18 mg/dL (ref 8–23)
CO2: 28 mmol/L (ref 22–32)
Calcium: 8.6 mg/dL — ABNORMAL LOW (ref 8.9–10.3)
Chloride: 101 mmol/L (ref 98–111)
Creatinine, Ser: 0.73 mg/dL (ref 0.44–1.00)
GFR, Estimated: >60 mL/min (ref >60)
Glucose, Bld: 185 mg/dL — ABNORMAL HIGH (ref 70–99)
Potassium: 3.8 mmol/L (ref 3.5–5.1)
Sodium: 137 mmol/L (ref 135–145)

## 2022-06-14 LAB — HEPATIC FUNCTION PANEL
ALT: 17 U/L (ref 0–44)
AST: 18 U/L (ref 15–41)
Albumin: 3.4 g/dL — ABNORMAL LOW (ref 3.5–5.0)
Alkaline Phosphatase: 85 U/L (ref 38–126)
Bilirubin, Direct: 0.1 mg/dL (ref 0.0–0.2)
Indirect Bilirubin: 0.3 mg/dL (ref 0.3–0.9)
Total Bilirubin: 0.4 mg/dL (ref 0.3–1.2)
Total Protein: 6.8 g/dL (ref 6.5–8.1)

## 2022-06-14 LAB — CBC WITH DIFFERENTIAL/PLATELET
Abs Immature Granulocytes: 0.02 10*3/uL (ref 0.00–0.07)
Basophils Absolute: 0 10*3/uL (ref 0.0–0.1)
Basophils Relative: 1 %
Eosinophils Absolute: 0.1 10*3/uL (ref 0.0–0.5)
Eosinophils Relative: 1 %
HCT: 40.8 % (ref 36.0–46.0)
Hemoglobin: 13.1 g/dL (ref 12.0–15.0)
Immature Granulocytes: 0 %
Lymphocytes Relative: 18 %
Lymphs Abs: 1 10*3/uL (ref 0.7–4.0)
MCH: 29.2 pg (ref 26.0–34.0)
MCHC: 32.1 g/dL (ref 30.0–36.0)
MCV: 91.1 fL (ref 80.0–100.0)
Monocytes Absolute: 0.5 10*3/uL (ref 0.1–1.0)
Monocytes Relative: 10 %
Neutro Abs: 3.8 10*3/uL (ref 1.7–7.7)
Neutrophils Relative %: 70 %
Platelets: 221 10*3/uL (ref 150–400)
RBC: 4.48 MIL/uL (ref 3.87–5.11)
RDW: 14.4 % (ref 11.5–15.5)
WBC: 5.4 10*3/uL (ref 4.0–10.5)
nRBC: 0 % (ref 0.0–0.2)

## 2022-06-14 LAB — PROTIME-INR
INR: 1.1 (ref 0.8–1.2)
Prothrombin Time: 13.9 seconds (ref 11.4–15.2)

## 2022-06-14 LAB — LIPASE, BLOOD: Lipase: 55 U/L — ABNORMAL HIGH (ref 11–51)

## 2022-06-14 LAB — CBG MONITORING, ED: Glucose-Capillary: 145 mg/dL — ABNORMAL HIGH (ref 70–99)

## 2022-06-14 MED ORDER — ACETAMINOPHEN 650 MG RE SUPP: 650.0000 mg | Freq: Four times a day (QID) | RECTAL | Status: DC | PRN

## 2022-06-14 MED ORDER — MORPHINE SULFATE (PF) 4 MG/ML IV SOLN
4.0000 mg | INTRAVENOUS | Status: DC | PRN
  Administered 2022-06-14: 4 mg via INTRAVENOUS
  Filled 2022-06-14: qty 1

## 2022-06-14 MED ORDER — HYDROMORPHONE HCL 1 MG/ML IJ SOLN
1.0000 mg | Freq: Once | INTRAMUSCULAR | Status: AC
  Administered 2022-06-14: 1 mg via INTRAVENOUS
  Filled 2022-06-14: qty 1

## 2022-06-14 MED ORDER — ONDANSETRON HCL 4 MG/2ML IJ SOLN
4.0000 mg | Freq: Once | INTRAMUSCULAR | Status: AC
  Administered 2022-06-14: 4 mg via INTRAVENOUS
  Filled 2022-06-14: qty 2

## 2022-06-14 MED ORDER — HYDRALAZINE HCL 20 MG/ML IJ SOLN: 10.0000 mg | Freq: Four times a day (QID) | INTRAMUSCULAR | Status: DC | PRN

## 2022-06-14 MED ORDER — ONDANSETRON HCL 4 MG/2ML IJ SOLN
4.0000 mg | Freq: Four times a day (QID) | INTRAMUSCULAR | Status: DC | PRN
  Administered 2022-06-16: 4 mg via INTRAVENOUS
  Filled 2022-06-14: qty 2

## 2022-06-14 MED ORDER — ACETAMINOPHEN 325 MG PO TABS: 650.0000 mg | ORAL_TABLET | Freq: Four times a day (QID) | ORAL | Status: DC | PRN

## 2022-06-14 MED ORDER — INSULIN ASPART 100 UNIT/ML IJ SOLN
0.0000 [IU] | INTRAMUSCULAR | Status: DC
  Administered 2022-06-14 – 2022-06-15 (×3): 2 [IU] via SUBCUTANEOUS
  Filled 2022-06-14 (×3): qty 1

## 2022-06-14 MED ORDER — ONDANSETRON HCL 4 MG PO TABS: 4.0000 mg | ORAL_TABLET | Freq: Four times a day (QID) | ORAL | Status: DC | PRN

## 2022-06-14 MED ORDER — MORPHINE SULFATE (PF) 2 MG/ML IV SOLN
2.0000 mg | INTRAVENOUS | Status: DC | PRN
  Administered 2022-06-14 – 2022-06-15 (×3): 2 mg via INTRAVENOUS
  Filled 2022-06-14 (×3): qty 1

## 2022-06-14 MED ORDER — SODIUM CHLORIDE 0.9 % IV SOLN: INTRAVENOUS | Status: AC

## 2022-06-14 MED ORDER — ONDANSETRON HCL 4 MG/2ML IJ SOLN: 4.0000 mg | Freq: Once | INTRAMUSCULAR | Status: DC

## 2022-06-14 NOTE — ED Provider Notes (Signed)
[facility] Provider Note   CSN: [id number] Arrival date & time: 06/14/22  1714     History  Chief Complaint  Patient presents with   Jacqueline Bird is a 72 y.o. female.  Patient brought in by EMS.  From home after a fall this afternoon.  Patient states she was just sitting in a chair when she collapsed.  Complaining of left hip pain left leg shortening.  Did not hit her head no loss of consciousness patient does take blood thinners.  Past medical history is somewhat complicated.  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  Anal cancer is followed by Dr. Delton Coombes diagnosed in September 2020 has chemotherapy started recent CT and previous PET scan without suspicion for metastasis though there was mild rectal wall thickening noted that appeared to have improved on the last CT with contrast in June.  Patient also with new diagnosis of cirrhosis.  Seen by [facility] gastroenterology..  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  Hypertension hyperlipidemia type 2 diabetes.  Patient states that the DVTs are remote.  But they have continued her on blood thinners.  In addition patient had amputation of her left hand.  This was due to an industrial accident.       Home Medications Prior to Admission medications   Medication Sig Start Date End Date Taking? Authorizing Provider  acetaminophen (TYLENOL) 500 MG tablet Take 500-1,000 mg by mouth every 6 (six) hours as needed for moderate pain.   Yes [provider]  ALPRAZolam (XANAX) 0.5 MG tablet TAKE 1 TABLET BY MOUTH AT BEDTIME AS NEEDED FOR ANXIETY / SLEEP Patient taking differently: Take 0.5 mg by mouth at bedtime. 03/25/20  Yes Derek Jack,  MD  atorvastatin (LIPITOR) 80 MG tablet Take 80 mg by mouth every evening.  11/29/19  Yes [provider]  Cholecalciferol (VITAMIN D) 125 MCG (5000 UT) CAPS Take 5,000 Units by mouth 4 (four) times a week.   Yes [provider]  ELIQUIS 5 MG TABS tablet Take 5 mg by mouth 2 (two) times daily. 02/19/21  Yes [provider]  FARXIGA 10 MG TABS tablet Take 10 mg by mouth daily. 02/01/20  Yes [provider]  furosemide (LASIX) 20 MG tablet Take 20 mg by mouth daily. 02/19/21  Yes [provider]  gabapentin (NEURONTIN) 100 MG capsule Take 100-200 mg by mouth See admin instructions. Take 200 mg by mouth in the morning and take 100 mg at night. 01/09/19  Yes [provider]  NOVOLOG FLEXPEN 100 UNIT/ML FlexPen Inject 4 Units into the skin in the morning, at noon, and at bedtime. 02/01/20  Yes [provider]  sertraline (ZOLOFT) 100 MG tablet Take 100 mg by mouth daily. 11/29/19  Yes [provider]  traZODone (DESYREL) 50 MG tablet Take 100 mg by mouth at bedtime. 08/28/19  Yes [provider]  TRESIBA FLEXTOUCH 200 UNIT/ML SOPN Inject 20 Units into the skin daily before breakfast. Patient taking differently: Inject 28 Units into the skin daily before breakfast. 05/27/19  Yes Memon, Jolaine Artist, MD  TRULICITY 8.12 XN/1.7GY SOPN Inject 0.75 mg into the muscle every Sunday.  01/10/19  Yes [provider]      Allergies    Sulfa  antibiotics    Review of Systems   Review of Systems  Constitutional:  Negative for chills and fever.  HENT:  Negative for ear pain and sore throat.   Eyes:  Negative for pain and visual disturbance.  Respiratory:  Negative for cough and shortness of breath.   Cardiovascular:  Negative for chest pain and palpitations.  Gastrointestinal:  Negative for abdominal pain and vomiting.  Genitourinary:  Negative for dysuria and hematuria.  Musculoskeletal:  Negative for arthralgias and back pain.  Skin:   Negative for color change and rash.  Neurological:  Negative for seizures and syncope.  Hematological:  Bruises/bleeds easily.  All other systems reviewed and are negative.   Physical Exam Updated Vital Signs BP (!) 121/52   Pulse 79   Temp 98.1 F (36.7 C) (Oral)   Resp 18   Ht 1.626 m ('5\' 4"'$ )   Wt 81.2 kg   SpO2 91%   BMI 30.73 kg/m  Physical Exam Vitals and nursing note reviewed.  Constitutional:      General: She is not in acute distress.    Appearance: Normal appearance. She is well-developed.  HENT:     Head: Normocephalic and atraumatic.  Eyes:     Extraocular Movements: Extraocular movements intact.     Conjunctiva/sclera: Conjunctivae normal.     Pupils: Pupils are equal, round, and reactive to light.  Cardiovascular:     Rate and Rhythm: Normal rate and regular rhythm.     Heart sounds: No murmur heard. Pulmonary:     Effort: Pulmonary effort is normal. No respiratory distress.     Breath sounds: Normal breath sounds.  Abdominal:     Palpations: Abdomen is soft.     Tenderness: There is no abdominal tenderness.  Musculoskeletal:        General: No swelling.     Cervical back: Neck supple.     Comments: Amputation of left hand.  With some atrophy to the left forearm.  Left leg with tenderness left hip and shortening of the leg.  Neurovascularly intact  Skin:    General: Skin is warm and dry.     Capillary Refill: Capillary refill takes less than 2 seconds.  Neurological:     General: No focal deficit present.     Mental Status: She is alert and oriented to person, place, and time.  Psychiatric:        Mood and Affect: Mood normal.     ED Results / Procedures / Treatments   Labs (all labs ordered are listed, but only abnormal results are displayed) Labs Reviewed  BASIC METABOLIC PANEL - Abnormal; Notable for the following components:      Result Value   Glucose, Bld 185 (*)    Calcium 8.6 (*)    All other components within normal limits  HEPATIC  FUNCTION PANEL - Abnormal; Notable for the following components:   Albumin 3.4 (*)    All other components within normal limits  LIPASE, BLOOD - Abnormal; Notable for the following components:   Lipase 55 (*)    All other components within normal limits  CBC WITH DIFFERENTIAL/PLATELET  PROTIME-INR    EKG EKG Interpretation  Date/Time:  Sunday June 14 2022 17:49:54 EST Ventricular Rate:  70 PR Interval:  160 QRS Duration: 121 QT Interval:  439 QTC Calculation: 474 R Axis:   42 Text Interpretation: Sinus rhythm Nonspecific intraventricular conduction delay Confirmed by Fredia Sorrow [phone]) on 06/14/2022 5:51:32 PM  Radiology DG Chest 1 View  Result Date: 06/14/2022 CLINICAL DATA:  Fall. EXAM: CHEST  1 VIEW COMPARISON:  Oct 16, 2019. FINDINGS: The heart size and mediastinal contours are within normal limits. Both lungs are clear. Left internal jugular catheter is noted with distal tip in expected position of SVC. The visualized skeletal structures are unremarkable. IMPRESSION: No active disease. Electronically Signed   By: Marijo Conception M.D.   On: 06/14/2022 18:54   DG Hip Unilat With Pelvis 2-3 Views Left  Result Date: 06/14/2022 CLINICAL DATA:  Fall. EXAM: DG HIP (WITH OR WITHOUT PELVIS) 2-3V LEFT COMPARISON:  None Available. FINDINGS: Severely comminuted and displaced fracture is seen involving the intertrochanteric region of the proximal left femur. IMPRESSION: Severely comminuted and displaced intertrochanteric fracture of proximal left femur. Electronically Signed   By: Marijo Conception M.D.   On: 06/14/2022 18:53    Procedures Procedures    Medications Ordered in ED Medications  morphine (PF) 4 MG/ML injection 4 mg (4 mg Intravenous Given 06/14/22 1738)  0.9 %  sodium chloride infusion ( Intravenous New Bag/Given 06/14/22 1746)  ondansetron (ZOFRAN) injection 4 mg (4 mg Intravenous Not Given 06/14/22 1850)  ondansetron (ZOFRAN) injection 4 mg (4 mg Intravenous Given  06/14/22 1737)  HYDROmorphone (DILAUDID) injection 1 mg (1 mg Intravenous Given 06/14/22 1856)    ED Course/ Medical Decision Making/ A&P                             Medical Decision Making Amount and/or Complexity of Data Reviewed Labs: ordered. Radiology: ordered.  Risk Prescription drug management. Decision regarding hospitalization.   Patient with left hip fracture.  Discussed with Dr. Ninfa Linden from Ortho care is recommending CT. to rule out pathological fracture.  Wants patient [facility] to [facility].  Patient with significant medical problems will need medical clearance by hospitalist.   Final Clinical Impression(s) / ED Diagnoses Final diagnoses:  Closed fracture of left hip, initial encounter [facility])    Rx / DC Orders ED Discharge Orders     None         Fredia Sorrow, MD 06/14/22 2014

## 2022-06-14 NOTE — ED Notes (Signed)
Patient transported to CT 

## 2022-06-14 NOTE — Progress Notes (Addendum)
Patient ID: Jacqueline Bird, female   DOB: 03-25-1951, 72 y.o.   MRN: [id number] I was called in consultation about this patient since I am on orthopedics unassigned call in [place] from [place].  I did review the x-rays and she has a proximal complex left hip intertrochanteric fracture.  The usual treatment for this is the surgical recommendation.  Obviously she will have to be transferred to the [facility] system from [facility].  I am unsure as to the timing of surgery given the large volume of cases that are already in front of her case as well as OR availability, anesthesia and staffing availability etc.  I already have a full operating room schedule on Tuesday so I may have to consult one of my partners orthopedic colleagues in town.  Regardless, she will need to be admitted to the medicine service for clearance when a bed is available down here in [place].  The patient should be n.p.o. after midnight tonight just in case.

## 2022-06-14 NOTE — H&P (Signed)
History and Physical    Patient: Jacqueline Bird NOM:[id number] DOB: 10/27/1950 DOA: 06/14/2022 DOS: the patient was seen and examined on 06/14/2022 PCP: Celene Squibb, MD  Patient coming from: Home  Chief Complaint:  Chief Complaint  Patient presents with   Fall   HPI: Jacqueline Bird is a 72 y.o. female with medical history significant of hypertension, hyperlipidemia, T2DM, stage IIIc (T3 N1c) squamous cell carcinoma of the anus (follows with Dr. 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  Patient states that she got up from a sitting position and unfortunately stepped on something and lost balance, she states that she slid down to the floor (hard wood) and then developed left hip pain with difficulty in being able to bear weight on the leg.  EMS was activated and the patient was sent to the ED for further evaluation and management.  ED Course:  In the emergency department, BP was 121/52 and other vital signs were within normal range.  Workup in the ED showed normal CBC and BMP except for blood glucose of 185.  Albumin 3.4, lipase 55. Chest x-ray showed no active disease Left hip x-ray showed severely comminuted and displaced intertrochanteric fracture of proximal left femur. She was treated with Dilaudid, morphine and Zofran.  IV hydration was provided.  Orthopedic surgeon (Dr. Ninfa Linden) was consulted and recommended admitting patient to [facility] with plan to follow-up on arrival to [facility] by the orthopedic team.  Hospitalist was asked to admit patient for further evaluation and management.  Review of Systems: Review of systems as noted in the HPI. All other systems reviewed and are negative.   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, left ([place])    Anal cancer ([place])    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   Cervical cancer (HCC)    Depression     DVT (deep venous thrombosis) (HCC)    Essential hypertension    History of cervical cancer    Mixed hyperlipidemia    Type 2 diabetes mellitus [facility])    Past Surgical History:  Procedure Laterality Date   AIR/FLUID EXCHANGE Left 03/01/2020   Procedure: AIR/FLUID EXCHANGE;  Surgeon: Jalene Mullet, MD;  Location: [place];  Service: Ophthalmology;  Laterality: Left;   CATARACT EXTRACTION, BILATERAL Right 05/2017   CERVICAL CONE BIOPSY     HAND AMPUTATION Left    INJECTION OF SILICONE OIL Left 96/28/3662   Procedure: INJECTION OF SILICONE OIL;  Surgeon: Jalene Mullet, MD;  Location: [place];  Service: Ophthalmology;  Laterality: Left;   PARS PLANA VITRECTOMY Left 03/01/2020   Procedure: PARS PLANA VITRECTOMY WITH 25 GAUGE;  Surgeon: Jalene Mullet, MD;  Location: [place];  Service: Ophthalmology;  Laterality: Left;   PHOTOCOAGULATION WITH LASER Left 03/01/2020   Procedure: PHOTOCOAGULATION WITH LASER;  Surgeon: Jalene Mullet, MD;  Location: [place];  Service: Ophthalmology;  Laterality: Left;   PORTACATH PLACEMENT Left 02/08/2019   Procedure: INSERTION PORT-A-CATH;  Surgeon: Virl Cagey, MD;  Location: [facility];  Service: General;  Laterality: Left;   RECTAL BIOPSY  01/26/2019   Procedure: BIOPSY OF ANAL MASS;  Surgeon: Virl Cagey, MD;  Location: [facility];  Service: General;;   RECTAL BIOPSY N/A 11/13/2019   Procedure: BIOPSY ANAL AREA;  Surgeon: Virl Cagey, MD;  Location: [facility];  Service: General;  Laterality: N/A;  RECTAL EXAM UNDER ANESTHESIA N/A 01/26/2019   Procedure: RECTAL EXAM UNDER ANESTHESIA;  Surgeon: Virl Cagey, MD;  Location: [facility];  Service: General;  Laterality: N/A;   RECTAL EXAM UNDER ANESTHESIA N/A 11/13/2019   Procedure: RECTAL EXAM UNDER ANESTHESIA  WITH ANAL BIOPSY;  Surgeon: Virl Cagey, MD;  Location: [facility];  Service: General;  Laterality: N/A;   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 She has been exposed to tobacco smoke. She has never used smokeless tobacco. She reports that she does not currently use alcohol. She reports that she does not use drugs.   Allergies  Allergen Reactions   Sulfa Antibiotics Anaphylaxis and Swelling    Per pt, can use creams with sulfa in it    Family History  Problem Relation Age of Onset   Cataracts Mother    Glaucoma Mother    Heart disease Mother    Diabetes Father    Heart attack Father    Cataracts Sister    Lung disease Sister    Cataracts Brother    Diabetes Brother    Heart disease Brother    Macular degeneration Maternal Aunt    Diabetes Maternal Grandfather    Heart disease Sister    Diabetes Sister    Throat cancer Son    Breast cancer Niece    Breast cancer Niece    Amblyopia Neg Hx    Blindness Neg Hx    Retinal detachment Neg Hx    Strabismus Neg Hx    Retinitis pigmentosa Neg Hx     ***  Prior to Admission medications   Medication Sig Start Date End Date Taking? Authorizing Provider  acetaminophen (TYLENOL) 500 MG tablet Take 500-1,000 mg by mouth every 6 (six) hours as needed for moderate pain.   Yes [provider]  ALPRAZolam (XANAX) 0.5 MG tablet TAKE 1 TABLET BY MOUTH AT BEDTIME AS NEEDED FOR ANXIETY / SLEEP Patient taking differently: Take 0.5 mg by mouth at bedtime. 03/25/20  Yes Derek Jack, MD  atorvastatin (LIPITOR) 80 MG tablet Take 80 mg by mouth every evening.  11/29/19  Yes [provider]  Cholecalciferol (VITAMIN D) 125 MCG (5000 UT) CAPS Take 5,000 Units by mouth 4 (four) times a week.   Yes [provider]  ELIQUIS 5 MG TABS tablet Take 5 mg by mouth 2 (two) times daily. 02/19/21  Yes [provider]  FARXIGA 10 MG  TABS tablet Take 10 mg by mouth daily. 02/01/20  Yes [provider]  furosemide (LASIX) 20 MG tablet Take 20 mg by mouth daily. 02/19/21  Yes [provider]  gabapentin (NEURONTIN) 100 MG capsule Take 100-200 mg by mouth See admin instructions. Take 200 mg by mouth in the morning and take 100 mg at night. 01/09/19  Yes [provider]  NOVOLOG FLEXPEN 100 UNIT/ML FlexPen Inject 4 Units into the skin in the morning, at noon, and at bedtime. 02/01/20  Yes [provider]  sertraline (ZOLOFT) 100 MG tablet Take 100 mg by mouth daily. 11/29/19  Yes [provider]  traZODone (DESYREL) 50 MG tablet Take 100 mg by mouth at bedtime. 08/28/19  Yes [provider]  TRESIBA FLEXTOUCH 200 UNIT/ML SOPN Inject  20 Units into the skin daily before breakfast. Patient taking differently: Inject 28 Units into the skin daily before breakfast. 05/27/19  Yes Memon, Jolaine Artist, MD  TRULICITY 2.22 LN/9.8XQ SOPN Inject 0.75 mg into the muscle every Sunday.  01/10/19  Yes [provider]    Physical Exam: BP (!) 153/66 (BP Location: Right Arm)   Pulse 79   Temp 98.1 F (36.7 C) (Oral)   Resp 14   Ht '5\' 4"'$  (1.626 m)   Wt 81.2 kg   SpO2 99%   BMI 30.73 kg/m   General: 72 y.o. year-old female well developed well nourished in no acute distress.  Alert and oriented x3. HEENT: NCAT, EOMI Neck: Supple, trachea medial Cardiovascular: Regular rate and rhythm with no rubs or gallops.  No thyromegaly or JVD noted.  No lower extremity edema. 2/4 pulses in all 4 extremities. Respiratory: Clear to auscultation with no wheezes or rales. Good inspiratory effort. Abdomen: Soft, nontender nondistended with normal bowel sounds x4 quadrants. Muskuloskeletal: Noted left hand amputation with some atrophy of the left forearm.  Tender to palpation of left hip no cyanosis, clubbing or edema noted bilaterally Neuro: CN II-XII intact, strength 5/5 x 4, sensation, reflexes  intact Skin: No ulcerative lesions noted or rashes Psychiatry: Judgement and insight appear normal. Mood is appropriate for condition and setting          Labs on Admission:  Basic Metabolic Panel: Recent Labs  Lab 06/14/22 1730  NA 137  K 3.8  CL 101  CO2 28  GLUCOSE 185*  BUN 18  CREATININE 0.73  CALCIUM 8.6*   Liver Function Tests: Recent Labs  Lab 06/14/22 1730  AST 18  ALT 17  ALKPHOS 85  BILITOT 0.4  PROT 6.8  ALBUMIN 3.4*   Recent Labs  Lab 06/14/22 1730  LIPASE 55*   No results for input(s): "AMMONIA" in the last 168 hours. CBC: Recent Labs  Lab 06/14/22 1730  WBC 5.4  NEUTROABS 3.8  HGB 13.1  HCT 40.8  MCV 91.1  PLT 221   Cardiac Enzymes: No results for input(s): "CKTOTAL", "CKMB", "CKMBINDEX", "TROPONINI" in the last 168 hours.  BNP (last 3 results) No results for input(s): "BNP" in the last 8760 hours.  ProBNP (last 3 results) No results for input(s): "PROBNP" in the last 8760 hours.  CBG: Recent Labs  Lab 06/14/22 2147  GLUCAP 145*    Radiological Exams on Admission: CT Hip Left Wo Contrast  Result Date: 06/14/2022 CLINICAL DATA:  Known proximal left femoral fracture EXAM: CT OF THE LEFT HIP WITHOUT CONTRAST TECHNIQUE: Multidetector CT imaging of the left hip was performed according to the standard protocol. Multiplanar CT image reconstructions were also generated. RADIATION DOSE REDUCTION: This exam was performed according to the departmental dose-optimization program which includes automated exposure control, adjustment of the mA and/or kV according to patient size and/or use of iterative reconstruction technique. COMPARISON:  Plain film from earlier in the same day, CT from 02/03/2022 FINDINGS: Bones/Joint/Cartilage There is again noted a comminuted intratrochanteric fracture of the proximal left femur with impaction and mild angulation at the fracture site. The femoral head is well seated. No cortical abnormality is identified to  suggest pathologic fracture. Increased soft tissue density is noted within the fracture site consistent with focal hemorrhage. There are changes consistent with prior insufficiency fracture of the sacrum. No other focal abnormality is noted. Ligaments Suboptimally assessed by CT. Muscles and Tendons Surrounding musculature appears within normal limits. Soft tissues Surrounding soft  tissue structures demonstrate diffuse vascular calcifications. The pelvic structures appear within normal limits. IMPRESSION: Comminuted intratrochanteric fracture of the proximal left femur. No lytic or sclerotic foci are identified to suggest a pathologic fracture. Changes of prior insufficiency fracture in the left sacrum No other focal abnormality is noted. Electronically Signed   By: Inez Catalina M.D.   On: 06/14/2022 21:12   DG Chest 1 View  Result Date: 06/14/2022 CLINICAL DATA:  Fall. EXAM: CHEST  1 VIEW COMPARISON:  Oct 16, 2019. FINDINGS: The heart size and mediastinal contours are within normal limits. Both lungs are clear. Left internal jugular catheter is noted with distal tip in expected position of SVC. The visualized skeletal structures are unremarkable. IMPRESSION: No active disease. Electronically Signed   By: Marijo Conception M.D.   On: 06/14/2022 18:54   DG Hip Unilat With Pelvis 2-3 Views Left  Result Date: 06/14/2022 CLINICAL DATA:  Fall. EXAM: DG HIP (WITH OR WITHOUT PELVIS) 2-3V LEFT COMPARISON:  None Available. FINDINGS: Severely comminuted and displaced fracture is seen involving the intertrochanteric region of the proximal left femur. IMPRESSION: Severely comminuted and displaced intertrochanteric fracture of proximal left femur. Electronically Signed   By: Marijo Conception M.D.   On: 06/14/2022 18:53    EKG: I independently viewed the EKG done and my findings are as followed: Normal sinus rhythm at a rate of 70 bpm  Assessment/Plan Present on Admission:  Essential hypertension  Anal cancer  ([place])  Principal Problem:   Closed comminuted intertrochanteric fracture of left femur ([place]) Active Problems:   Anal cancer ([place])   Essential hypertension   Hypoalbuminemia due to protein-calorie malnutrition ([place])   Type 2 diabetes mellitus with hyperglycemia ([place])   Mixed hyperlipidemia   Elevated lipase   Obesity (BMI 30-39.9)   DVT (deep venous thrombosis) (HCC)  Severely comminuted and displaced intertrochanteric fracture of proximal left femur. Continue IV morphine as needed Continue fall precaution N.p.o. at midnight Orthopedic surgeon at [facility] was already consulted and recommended admitting to [facility]  Type 2 diabetes mellitus with hyperglycemia Continue ISS and hypoglycemic protocol  Hypoalbuminemia secondary to mild protein calorie malnutrition Albumin 3.4, consider starting patient protein supplements after she resumes oral intake  Elevated lipase level Lipase 55, patient denies any abdominal pain Continue to monitor lipase level  Essential hypertension Continue IV hydralazine 10 mg every 6 hours as needed for SBP > 170 Resume home meds when patient is able to tolerate oral intake  Mixed hyperlipidemia Consider restarting patient's home meds when she resumes oral intake  History of DVT Patient was on Eliquis, this will be held at this time  Squamous cell carcinoma of the anus Stable, patient follows with Dr. Delton Coombes  Obesity (BMI 30.73) Diet and lifestyle modification  DVT prophylaxis: SCDs  Code Status: Full code  Family Communication: None at bedside  Consults: Orthopedic surgery (by [facility] EDP)  Severity of Illness: The appropriate patient status for this patient is INPATIENT. Inpatient status is judged to be reasonable and necessary in order to provide the required intensity of service to ensure the patient's safety. The patient's presenting symptoms, physical exam findings, and initial radiographic and laboratory data in the context of their  chronic comorbidities is felt to place them at high risk for further clinical deterioration. Furthermore, it is not anticipated that the patient will be medically stable for discharge from the hospital within 2 midnights of admission.   * I certify that at the point of admission it is  my clinical judgment that the patient will require inpatient hospital care spanning beyond 2 midnights from the point of admission due to high intensity of service, high risk for further deterioration and high frequency of surveillance required.*  Author: Bernadette Hoit, DO 06/14/2022 10:31 PM  For on call review [email or web address].

## 2022-06-14 NOTE — ED Triage Notes (Signed)
Pt BIB [facility] from home after fall this afternoon. Pt c/o L hip pain. Denies hitting head, LOC. Does take blood thinners.   Pt A&O X 4.

## 2022-06-14 NOTE — ED Notes (Signed)
Pt care taken, pt resting is aware of nothing by mouth after 12 am. No complaints at this time.

## 2022-06-15 ENCOUNTER — Telehealth (INDEPENDENT_AMBULATORY_CARE_PROVIDER_SITE_OTHER): Payer: Self-pay | Admitting: *Deleted

## 2022-06-15 DIAGNOSIS — S72002A Fracture of unspecified part of neck of left femur, initial encounter for closed fracture: Secondary | ICD-10-CM

## 2022-06-15 DIAGNOSIS — S72142A Displaced intertrochanteric fracture of left femur, initial encounter for closed fracture: Secondary | ICD-10-CM | POA: Diagnosis not present

## 2022-06-15 DIAGNOSIS — E669 Obesity, unspecified: Secondary | ICD-10-CM | POA: Diagnosis not present

## 2022-06-15 DIAGNOSIS — C210 Malignant neoplasm of anus, unspecified: Secondary | ICD-10-CM | POA: Diagnosis not present

## 2022-06-15 LAB — COMPREHENSIVE METABOLIC PANEL
ALT: 15 U/L (ref 0–44)
AST: 17 U/L (ref 15–41)
Albumin: 3.3 g/dL — ABNORMAL LOW (ref 3.5–5.0)
Alkaline Phosphatase: 65 U/L (ref 38–126)
Anion gap: 5 (ref 5–15)
BUN: 16 mg/dL (ref 8–23)
CO2: 26 mmol/L (ref 22–32)
Calcium: 8.3 mg/dL — ABNORMAL LOW (ref 8.9–10.3)
Chloride: 103 mmol/L (ref 98–111)
Creatinine, Ser: 0.64 mg/dL (ref 0.44–1.00)
GFR, Estimated: >60 mL/min (ref >60)
Glucose, Bld: 133 mg/dL — ABNORMAL HIGH (ref 70–99)
Potassium: 3.7 mmol/L (ref 3.5–5.1)
Sodium: 134 mmol/L — ABNORMAL LOW (ref 135–145)
Total Bilirubin: 0.5 mg/dL (ref 0.3–1.2)
Total Protein: 6.7 g/dL (ref 6.5–8.1)

## 2022-06-15 LAB — CBG MONITORING, ED
Glucose-Capillary: 131 mg/dL — ABNORMAL HIGH (ref 70–99)
Glucose-Capillary: 132 mg/dL — ABNORMAL HIGH (ref 70–99)
Glucose-Capillary: 139 mg/dL — ABNORMAL HIGH (ref 70–99)
Glucose-Capillary: 141 mg/dL — ABNORMAL HIGH (ref 70–99)
Glucose-Capillary: 167 mg/dL — ABNORMAL HIGH (ref 70–99)
Glucose-Capillary: 173 mg/dL — ABNORMAL HIGH (ref 70–99)

## 2022-06-15 LAB — PHOSPHORUS: Phosphorus: 3 mg/dL (ref 2.5–4.6)

## 2022-06-15 LAB — CBC
HCT: 36.8 % (ref 36.0–46.0)
Hemoglobin: 11.7 g/dL — ABNORMAL LOW (ref 12.0–15.0)
MCH: 29.3 pg (ref 26.0–34.0)
MCHC: 31.8 g/dL (ref 30.0–36.0)
MCV: 92.2 fL (ref 80.0–100.0)
Platelets: 218 10*3/uL (ref 150–400)
RBC: 3.99 MIL/uL (ref 3.87–5.11)
RDW: 14.4 % (ref 11.5–15.5)
WBC: 7.3 10*3/uL (ref 4.0–10.5)
nRBC: 0 % (ref 0.0–0.2)

## 2022-06-15 LAB — LIPASE, BLOOD: Lipase: 36 U/L (ref 11–51)

## 2022-06-15 LAB — MAGNESIUM: Magnesium: 2.1 mg/dL (ref 1.7–2.4)

## 2022-06-15 LAB — HEMOGLOBIN A1C
Hgb A1c MFr Bld: 6.9 % — ABNORMAL HIGH (ref 4.8–5.6)
Mean Plasma Glucose: 151.33 mg/dL

## 2022-06-15 LAB — HIV ANTIBODY (ROUTINE TESTING W REFLEX): HIV Screen 4th Generation wRfx: NONREACTIVE

## 2022-06-15 MED ORDER — MORPHINE SULFATE (PF) 2 MG/ML IV SOLN
2.0000 mg | Freq: Once | INTRAVENOUS | Status: AC
  Administered 2022-06-15: 2 mg via INTRAVENOUS
  Filled 2022-06-15: qty 1

## 2022-06-15 MED ORDER — GABAPENTIN 100 MG PO CAPS
100.0000 mg | ORAL_CAPSULE | Freq: Every day | ORAL | Status: DC
  Administered 2022-06-15 – 2022-06-22 (×8): 100 mg via ORAL
  Filled 2022-06-15 (×8): qty 1

## 2022-06-15 MED ORDER — ATORVASTATIN CALCIUM 80 MG PO TABS
80.0000 mg | ORAL_TABLET | Freq: Every evening | ORAL | Status: DC
  Administered 2022-06-15 – 2022-06-22 (×8): 80 mg via ORAL
  Filled 2022-06-15 (×2): qty 1
  Filled 2022-06-15: qty 2
  Filled 2022-06-15 (×5): qty 1

## 2022-06-15 MED ORDER — HYDROMORPHONE HCL 1 MG/ML IJ SOLN
0.5000 mg | INTRAMUSCULAR | Status: DC | PRN
  Administered 2022-06-15 – 2022-06-17 (×9): 0.5 mg via INTRAVENOUS
  Filled 2022-06-15 (×11): qty 0.5

## 2022-06-15 MED ORDER — SERTRALINE HCL 100 MG PO TABS
100.0000 mg | ORAL_TABLET | Freq: Every day | ORAL | Status: DC
  Administered 2022-06-15 – 2022-06-23 (×9): 100 mg via ORAL
  Filled 2022-06-15: qty 1
  Filled 2022-06-15: qty 2
  Filled 2022-06-15 (×4): qty 1
  Filled 2022-06-15: qty 2
  Filled 2022-06-15 (×2): qty 1

## 2022-06-15 MED ORDER — GABAPENTIN 100 MG PO CAPS
200.0000 mg | ORAL_CAPSULE | Freq: Every day | ORAL | Status: DC
  Administered 2022-06-15 – 2022-06-23 (×9): 200 mg via ORAL
  Filled 2022-06-15 (×9): qty 2

## 2022-06-15 MED ORDER — TRAZODONE HCL 50 MG PO TABS
100.0000 mg | ORAL_TABLET | Freq: Every day | ORAL | Status: DC
  Administered 2022-06-15 – 2022-06-22 (×8): 100 mg via ORAL
  Filled 2022-06-15 (×8): qty 2

## 2022-06-15 MED ORDER — INSULIN ASPART 100 UNIT/ML IJ SOLN
0.0000 [IU] | Freq: Three times a day (TID) | INTRAMUSCULAR | Status: DC
  Administered 2022-06-15 (×2): 1 [IU] via SUBCUTANEOUS
  Administered 2022-06-15 – 2022-06-16 (×2): 2 [IU] via SUBCUTANEOUS
  Administered 2022-06-18: 4 [IU] via SUBCUTANEOUS
  Administered 2022-06-18 (×2): 3 [IU] via SUBCUTANEOUS
  Administered 2022-06-19: 5 [IU] via SUBCUTANEOUS
  Administered 2022-06-19: 3 [IU] via SUBCUTANEOUS
  Filled 2022-06-15 (×3): qty 1

## 2022-06-15 MED ORDER — INSULIN ASPART 100 UNIT/ML IJ SOLN
0.0000 [IU] | Freq: Every day | INTRAMUSCULAR | Status: DC
  Administered 2022-06-17: 2 [IU] via SUBCUTANEOUS

## 2022-06-15 MED ORDER — ALPRAZOLAM 0.5 MG PO TABS
0.5000 mg | ORAL_TABLET | Freq: Every day | ORAL | Status: DC
  Administered 2022-06-15 – 2022-06-22 (×8): 0.5 mg via ORAL
  Filled 2022-06-15 (×8): qty 1

## 2022-06-15 NOTE — Telephone Encounter (Signed)
Thanks for the update

## 2022-06-15 NOTE — Progress Notes (Signed)
  Transition of Care [facility]) Screening Note   Patient Details  Name: Jacqueline Bird Date of Birth: 1951-04-11   Transition of Care [facility]) CM/SW Contact:    Iona Beard, [place] Phone Number: 06/15/2022, 9:57 AM    Transition of Care Department [facility]) has reviewed patient and no TOC needs have been identified at this time. We will continue to monitor patient advancement through interdisciplinary progression rounds. If new patient transition needs arise, please place a TOC consult.

## 2022-06-15 NOTE — ED Notes (Signed)
Hospital bed delivered, pt refusing at this time, reports want to stay in current bed

## 2022-06-15 NOTE — ED Notes (Signed)
Charge Nurse consulted for hospital bed for pt

## 2022-06-15 NOTE — Telephone Encounter (Signed)
Spoke with pt. On for procedure 2/2 with Dr. Jenetta Downer. She is currently waiting to be transferred to [facility] due to hip fracture and is going to need surgery. Procedure here cancelled for now. FYI to Dr. Jenetta Downer.

## 2022-06-15 NOTE — Progress Notes (Addendum)
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  The patient was getting up from a chair when she stepped on a foreign object on the floor and slipped and fell.  The patient denies any loss of consciousness or hitting her head.  She was unable to get up.  EMS was activated.  Prior to the fall, the patient had been in her usual state of health without any complaints.  Patient denies fevers, chills, headache, chest pain, dyspnea, nausea, vomiting, diarrhea, abdominal pain, dysuria, hematuria, hematochezia, and melena. In the ED, the patient was afebrile and hemodynamically stable with oxygen saturation 98% on room air.  WBC 5.4, hemoglobin 13.1, platelets 221,000.  Sodium 137, potassium 3.8, bicarbonate 28, serum creatinine 0.73.  LFTs were unremarkable.  CT of the left hip shows a comminuted anterior trochanteric fracture of the proximal left femur.  There is no lytic lesions or sclerosis to suggest a pathologic fracture.  Orthopedic surgery, Dr. Jean Rosenthal was consulted and recommended transfer to [facility] for operative intervention.  The patient was kept n.p.o.    Assessment/Plan: Left femur intertrochanteric fracture -Orthopedic surgery, Dr. Waymon Amato to [facility] for operative repair -Physical therapy after operative repair -Judicious opioids -Personally reviewed EKG--sinus rhythm, no ST-T wave change -Personally reviewed chest x-ray--no infiltrates or edema  Controlled diabetes mellitus type 2 -NovoLog sliding scale -Holding Tresiba temporarily -Holding Trulicity  Squamous cell carcinoma of the anus -Currently in remission -Follow-up Dr. Delton Coombes -Status post  XRT/5-FU/mitomycin  Elevated lipase -Not clinically significant presently -No abdominal pain or vomiting -Normal LFTs  Urine Retention -distended bladder on exam with pain -request foley placement in while in ED  Mixed hyperlipidemia -Continue statin  History of left lower extremity DVT -Holding apixaban in preparation for surgery -Last apixaban dose--06/14/22 AM  Obesity -BMI 30.73 -lifestyle modification   Family Communication:  no Family at bedside  Consultants:  ortho--Dr. Ninfa Linden  Code Status:  FULL   DVT Prophylaxis: apixaban on hold--last dose AM 06/14/22   Procedures: As Listed in Progress Note Above  Antibiotics: None      Subjective: Patient complains of suprapubic discomfort and pain.  She denies any headache, fevers, chills, chest pain, shortness breath, cough, hemoptysis, nausea, vomiting, diarrhea.  Objective: Vitals:   06/15/22 0100 06/15/22 0200 06/15/22 0400 06/15/22 0611  BP: 134/68 139/63 126/75 135/76  Pulse: 74 73 82 76  Resp: '16 18 19 14  '$ Temp:   98.1 F (36.7 C) 98.2 F (36.8 C)  TempSrc:      SpO2: 99% 99% 98% 99%  Weight:      Height:       No intake or output data in the 24 hours ending 06/15/22 0652 Weight change:  Exam:  General:  Pt is alert, follows commands appropriately, not in acute distress HEENT: No icterus, No thrush, No neck mass, [place]/AT Cardiovascular: RRR, S1/S2, no rubs, no gallops Respiratory: CTA bilaterally, no wheezing, no crackles, no rhonchi Abdomen: Soft/+BS, suprapubic tender, non distended, no guarding Extremities: No edema, No lymphangitis, No petechiae, No rashes, no synovitis   Data Reviewed: I have personally reviewed following labs and imaging studies Basic Metabolic Panel: Recent Labs  Lab 06/14/22 1730 06/15/22 0437  NA 137 134*  K 3.8 3.7  CL 101 103  CO2 28 26  GLUCOSE 185* 133*  BUN 18 16  CREATININE 0.73 0.64  CALCIUM 8.6* 8.3*  MG  --  2.1  PHOS  --  3.0   Liver Function  Tests: Recent Labs  Lab 06/14/22 1730 06/15/22 0437  AST 18 17  ALT 17 15  ALKPHOS 85 65  BILITOT 0.4 0.5  PROT 6.8 6.7  ALBUMIN 3.4* 3.3*   Recent Labs  Lab 06/14/22 1730 06/15/22 0437  LIPASE 55* 36   No results for input(s): "AMMONIA" in the last 168 hours. Coagulation Profile: Recent Labs  Lab 06/14/22 1730  INR 1.1   CBC: Recent Labs  Lab 06/14/22 1730 06/15/22 0437  WBC 5.4 7.3  NEUTROABS 3.8  --   HGB 13.1 11.7*  HCT 40.8 36.8  MCV 91.1 92.2  PLT 221 218   Cardiac Enzymes: No results for input(s): "CKTOTAL", "CKMB", "CKMBINDEX", "TROPONINI" in the last 168 hours. BNP: Invalid input(s): "POCBNP" CBG: Recent Labs  Lab 06/14/22 2147 06/15/22 0030 06/15/22 0435  GLUCAP 145* 132* 131*   HbA1C: No results for input(s): "HGBA1C" in the last 72 hours. Urine analysis: No results found for: "COLORURINE", "APPEARANCEUR", "LABSPEC", "PHURINE", "GLUCOSEU", "HGBUR", "BILIRUBINUR", "KETONESUR", "PROTEINUR", "UROBILINOGEN", "NITRITE", "LEUKOCYTESUR" Sepsis Labs: '@LABRCNTIP'$ (procalcitonin:4,lacticidven:4) )No results found for this or any previous visit (from the past 240 hour(s)).   Scheduled Meds:  insulin aspart  0-15 Units Subcutaneous Q4H   ondansetron  4 mg Intravenous Once   Continuous Infusions:  sodium chloride 75 mL/hr at 06/14/22 1746    Procedures/Studies: CT Hip Left Wo Contrast  Result Date: 06/14/2022 CLINICAL DATA:  Known proximal left femoral fracture EXAM: CT OF THE LEFT HIP WITHOUT CONTRAST TECHNIQUE: Multidetector CT imaging of the left hip was performed according to the standard protocol. Multiplanar CT image reconstructions were also generated. RADIATION DOSE REDUCTION: This exam was performed according to the departmental dose-optimization program which includes automated exposure control, adjustment of the mA and/or kV according to patient size and/or use of iterative reconstruction technique. COMPARISON:  Plain film from earlier in the  same day, CT from 02/03/2022 FINDINGS: Bones/Joint/Cartilage There is again noted a comminuted intratrochanteric fracture of the proximal left femur with impaction and mild angulation at the fracture site. The femoral head is well seated. No cortical abnormality is identified to suggest pathologic fracture. Increased soft tissue density is noted within the fracture site consistent with focal hemorrhage. There are changes consistent with prior insufficiency fracture of the sacrum. No other focal abnormality is noted. Ligaments Suboptimally assessed by CT. Muscles and Tendons Surrounding musculature appears within normal limits. Soft tissues Surrounding soft tissue structures demonstrate diffuse vascular calcifications. The pelvic structures appear within normal limits. IMPRESSION: Comminuted intratrochanteric fracture of the proximal left femur. No lytic or sclerotic foci are identified to suggest a pathologic fracture. Changes of prior insufficiency fracture in the left sacrum No other focal abnormality is noted. Electronically Signed   By: Inez Catalina M.D.   On: 06/14/2022 21:12   DG Chest 1 View  Result Date: 06/14/2022 CLINICAL DATA:  Fall. EXAM: CHEST  1 VIEW COMPARISON:  Oct 16, 2019. FINDINGS: The heart size and mediastinal contours are within normal limits. Both lungs are clear. Left internal jugular catheter is noted with distal tip in expected position of SVC. The visualized skeletal structures are unremarkable. IMPRESSION: No active disease. Electronically Signed   By: Marijo Conception M.D.   On: 06/14/2022 18:54  DG Hip Unilat With Pelvis 2-3 Views Left  Result Date: 06/14/2022 CLINICAL DATA:  Fall. EXAM: DG HIP (WITH OR WITHOUT PELVIS) 2-3V LEFT COMPARISON:  None Available. FINDINGS: Severely comminuted and displaced fracture is seen involving the intertrochanteric region of the proximal left femur. IMPRESSION: Severely comminuted and displaced intertrochanteric fracture of proximal left femur.  Electronically Signed   By: Marijo Conception M.D.   On: 06/14/2022 18:53    Orson Eva, DO  [facility]  If 7PM-7AM, please contact night-coverage [email or web address] Password [email or web address] 06/15/2022, 6:52 AM   LOS: 1 day

## 2022-06-15 NOTE — Hospital Course (Addendum)
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  The patient was getting up from a chair when she stepped on a foreign object on the floor and slipped and fell.  The patient denies any loss of consciousness or hitting her head.  She was unable to get up.  EMS was activated.  Prior to the fall, the patient had been in her usual state of health without any complaints.  Patient denies fevers, chills, headache, chest pain, dyspnea, nausea, vomiting, diarrhea, abdominal pain, dysuria, hematuria, hematochezia, and melena. In the ED, the patient was afebrile and hemodynamically stable with oxygen saturation 98% on room air.  WBC 5.4, hemoglobin 13.1, platelets 221,000.  Sodium 137, potassium 3.8, bicarbonate 28, serum creatinine 0.73.  LFTs were unremarkable.  CT of the left hip shows a comminuted anterior trochanteric fracture of the proximal left femur.  There is no lytic lesions or sclerosis to suggest a pathologic fracture.  Orthopedic surgery, Dr. Jean Rosenthal was consulted and recommended transfer to [facility] for operative intervention.    1/30--pt remains medically stable last 24 hours.  No new complaints.  Pain better controlled with prn hydromorphone.  NPO since MN awaiting transfer to [facility]

## 2022-06-15 NOTE — Progress Notes (Signed)
Patient ID: Jacqueline Bird, female   DOB: May 16, 1951, 72 y.o.   MRN: [id number] The patient has not been transported from [facility] to [facility] as of yet.  The patient should be allowed to eat today.  There is no availability in the OR schedule today to schedule the patient for surgery today.  The patient should be n.p.o. after midnight tonight.  We are trying to figure out where to get this patient on the schedule for this week.

## 2022-06-16 ENCOUNTER — Encounter (HOSPITAL_COMMUNITY): Payer: Medicare HMO

## 2022-06-16 DIAGNOSIS — S72142K Displaced intertrochanteric fracture of left femur, subsequent encounter for closed fracture with nonunion: Secondary | ICD-10-CM

## 2022-06-16 DIAGNOSIS — C210 Malignant neoplasm of anus, unspecified: Secondary | ICD-10-CM | POA: Diagnosis not present

## 2022-06-16 DIAGNOSIS — I10 Essential (primary) hypertension: Secondary | ICD-10-CM | POA: Diagnosis not present

## 2022-06-16 DIAGNOSIS — Z794 Long term (current) use of insulin: Secondary | ICD-10-CM

## 2022-06-16 DIAGNOSIS — E1165 Type 2 diabetes mellitus with hyperglycemia: Secondary | ICD-10-CM | POA: Diagnosis not present

## 2022-06-16 LAB — BASIC METABOLIC PANEL
Anion gap: 10 (ref 5–15)
BUN: 12 mg/dL (ref 8–23)
CO2: 22 mmol/L (ref 22–32)
Calcium: 8.4 mg/dL — ABNORMAL LOW (ref 8.9–10.3)
Chloride: 102 mmol/L (ref 98–111)
Creatinine, Ser: 0.56 mg/dL (ref 0.44–1.00)
GFR, Estimated: >60 mL/min (ref >60)
Glucose, Bld: 139 mg/dL — ABNORMAL HIGH (ref 70–99)
Potassium: 3.9 mmol/L (ref 3.5–5.1)
Sodium: 134 mmol/L — ABNORMAL LOW (ref 135–145)

## 2022-06-16 LAB — MAGNESIUM: Magnesium: 2 mg/dL (ref 1.7–2.4)

## 2022-06-16 LAB — CBC
HCT: 35.6 % — ABNORMAL LOW (ref 36.0–46.0)
Hemoglobin: 11.6 g/dL — ABNORMAL LOW (ref 12.0–15.0)
MCH: 29.9 pg (ref 26.0–34.0)
MCHC: 32.6 g/dL (ref 30.0–36.0)
MCV: 91.8 fL (ref 80.0–100.0)
Platelets: 199 10*3/uL (ref 150–400)
RBC: 3.88 MIL/uL (ref 3.87–5.11)
RDW: 14.5 % (ref 11.5–15.5)
WBC: 6.6 10*3/uL (ref 4.0–10.5)
nRBC: 0 % (ref 0.0–0.2)

## 2022-06-16 LAB — HEMOGLOBIN A1C
Hgb A1c MFr Bld: 6.7 % — ABNORMAL HIGH (ref 4.8–5.6)
Mean Plasma Glucose: 145.59 mg/dL

## 2022-06-16 LAB — CBG MONITORING, ED
Glucose-Capillary: 165 mg/dL — ABNORMAL HIGH (ref 70–99)
Glucose-Capillary: 140 mg/dL — ABNORMAL HIGH (ref 70–99)
Glucose-Capillary: 138 mg/dL — ABNORMAL HIGH (ref 70–99)
Glucose-Capillary: 153 mg/dL — ABNORMAL HIGH (ref 70–99)

## 2022-06-16 LAB — GLUCOSE, CAPILLARY
Glucose-Capillary: 115 mg/dL — ABNORMAL HIGH (ref 70–99)
Glucose-Capillary: 184 mg/dL — ABNORMAL HIGH (ref 70–99)

## 2022-06-16 MED ORDER — CYCLOBENZAPRINE HCL 10 MG PO TABS
5.0000 mg | ORAL_TABLET | Freq: Once | ORAL | Status: AC
  Administered 2022-06-16: 5 mg via ORAL
  Filled 2022-06-16: qty 1

## 2022-06-16 MED ORDER — METHOCARBAMOL 1000 MG/10ML IJ SOLN: 500.0000 mg | Freq: Four times a day (QID) | INTRAVENOUS | Status: DC | PRN

## 2022-06-16 MED ORDER — MUPIROCIN 2 % EX OINT
1.0000 | TOPICAL_OINTMENT | Freq: Two times a day (BID) | CUTANEOUS | Status: AC
  Administered 2022-06-16 – 2022-06-20 (×6): 1 via NASAL
  Filled 2022-06-16 (×3): qty 22

## 2022-06-16 MED ORDER — CHLORHEXIDINE GLUCONATE CLOTH 2 % EX PADS
6.0000 | MEDICATED_PAD | Freq: Every day | CUTANEOUS | Status: DC
  Administered 2022-06-16 – 2022-06-23 (×8): 6 via TOPICAL

## 2022-06-16 NOTE — Progress Notes (Signed)
Patient wants to be a DNR. Night shift coverage was paged through amnion hopefully orders will be entered

## 2022-06-16 NOTE — Progress Notes (Signed)
Patient ID: Jacqueline Bird, female   DOB: 1950/08/20, 72 y.o.   MRN: [id number] When the patient does finally arrived to [facility], she can have a regular diet this evening.  She should be made n.p.o. after midnight tonight in anticipation of potential surgery for tomorrow.  I am in the office seeing patients all day tomorrow so I will have to see if one of my orthopedic colleagues can address this fracture since it is been several days since the patient has been able to arrive to [facility].  The injury occurred on Sunday.  Worst-case scenario will be for me performing surgery tomorrow evening as an add-on.

## 2022-06-16 NOTE — Consult Note (Signed)
Reason for Consult: Left hip intertrochanteric fracture Referring Physician: Forestine Na, ER  Jacqueline Bird is an 72 y.o. female.  HPI: The patient is a 72 year old female who sustained an accidental mechanical fall Sunday evening (06/14/2022).  She was seen at [facility] emergency room and found to have a complex displaced left hip intertrochanteric fracture.  Since there was no orthopedic coverage on the weekend, I was contacted since I was on [place] unassigned call.  I agree that the patient needed to be transferred to the [facility] system forward medical admission and optimization as well as being able to address her hip fracture.  Unfortunately, due to bed availability, the patient only arrived to [facility] late this afternoon/early evening.  She does complain of left hip pain.  I was able to review her medical history in detail as well as medications.  He has been on blood thinning medication in the past due to a DVT.  She has been off blood thinning medication now since Sunday morning.  I spoke to her in length and she understands that surgery is planned for sometime tomorrow (06/17/2022).  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   Cervical cancer (HCC)    Depression    DVT (deep venous thrombosis) (HCC)    Essential hypertension    History of cervical cancer    Mixed hyperlipidemia    Type 2 diabetes mellitus [facility])     Past Surgical History:  Procedure Laterality Date   AIR/FLUID EXCHANGE Left 03/01/2020   Procedure: AIR/FLUID EXCHANGE;  Surgeon: Jalene Mullet, MD;  Location: [place];  Service: Ophthalmology;  Laterality: Left;   CATARACT EXTRACTION, BILATERAL Right 05/2017   CERVICAL CONE BIOPSY     HAND AMPUTATION Left    INJECTION OF SILICONE OIL Left 12/29/4816   Procedure: INJECTION OF SILICONE OIL;  Surgeon: Jalene Mullet, MD;  Location: [place];  Service:  Ophthalmology;  Laterality: Left;   PARS PLANA VITRECTOMY Left 03/01/2020   Procedure: PARS PLANA VITRECTOMY WITH 25 GAUGE;  Surgeon: Jalene Mullet, MD;  Location: [place];  Service: Ophthalmology;  Laterality: Left;   PHOTOCOAGULATION WITH LASER Left 03/01/2020   Procedure: PHOTOCOAGULATION WITH LASER;  Surgeon: Jalene Mullet, MD;  Location: [place];  Service: Ophthalmology;  Laterality: Left;   PORTACATH PLACEMENT Left 02/08/2019   Procedure: INSERTION PORT-A-CATH;  Surgeon: Virl Cagey, MD;  Location: [facility];  Service: General;  Laterality: Left;   RECTAL BIOPSY  01/26/2019   Procedure: BIOPSY OF ANAL MASS;  Surgeon: Virl Cagey, MD;  Location: [facility];  Service: General;;   RECTAL BIOPSY N/A 11/13/2019   Procedure: BIOPSY ANAL AREA;  Surgeon: Virl Cagey, MD;  Location: [facility];  Service: General;  Laterality: N/A;   RECTAL EXAM UNDER ANESTHESIA N/A 01/26/2019   Procedure: RECTAL EXAM UNDER ANESTHESIA;  Surgeon: Virl Cagey, MD;  Location: [facility];  Service: General;  Laterality: N/A;   RECTAL EXAM UNDER ANESTHESIA N/A 11/13/2019   Procedure: RECTAL EXAM UNDER ANESTHESIA  WITH ANAL BIOPSY;  Surgeon: Virl Cagey, MD;  Location: [facility];  Service: General;  Laterality: N/A;   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 03/01/2020   Procedure: REPAIR OF COMPLEX TRACTION RETINAL DETACHMENT;  Surgeon: Jalene Mullet, MD;  Location: [place];  Service: Ophthalmology;  Laterality: Left;   RETINAL DETACHMENT SURGERY  Bilateral 03/11/2020    Family History  Problem Relation Age of Onset   Cataracts Mother    Glaucoma Mother    Heart disease Mother    Diabetes Father    Heart attack Father    Cataracts Sister    Lung disease Sister    Cataracts Brother    Diabetes Brother    Heart disease Brother    Macular degeneration Maternal Aunt    Diabetes Maternal Grandfather    Heart disease Sister    Diabetes Sister    Throat cancer Son    Breast cancer Niece     Breast cancer Niece    Amblyopia Neg Hx    Blindness Neg Hx    Retinal detachment Neg Hx    Strabismus Neg Hx    Retinitis pigmentosa Neg Hx     Social History:  reports that she has never smoked. She has been exposed to tobacco smoke. She has never used smokeless tobacco. She reports that she does not currently use alcohol. She reports that she does not use drugs.  Allergies:  Allergies  Allergen Reactions   Sulfa Antibiotics Anaphylaxis and Swelling    Per pt, can use creams with sulfa in it    Medications: I have reviewed the patient's current medications.  Results for orders placed or performed during the hospital encounter of 06/14/22 (from the past 48 hour(s))  CBG monitoring, ED     Status: Abnormal   Collection Time: 06/14/22  9:47 PM  Result Value Ref Range   Glucose-Capillary 145 (H) 70 - 99 mg/dL    Comment: Glucose reference range applies only to samples taken after fasting for at least 8 hours.  CBG monitoring, ED     Status: Abnormal   Collection Time: 06/15/22 12:30 AM  Result Value Ref Range   Glucose-Capillary 132 (H) 70 - 99 mg/dL    Comment: Glucose reference range applies only to samples taken after fasting for at least 8 hours.  CBG monitoring, ED     Status: Abnormal   Collection Time: 06/15/22  4:35 AM  Result Value Ref Range   Glucose-Capillary 131 (H) 70 - 99 mg/dL    Comment: Glucose reference range applies only to samples taken after fasting for at least 8 hours.  Lipase, blood     Status: None   Collection Time: 06/15/22  4:37 AM  Result Value Ref Range   Lipase 36 11 - 51 U/L    Comment: Performed at [facility], [address] [place], [place] [address]  Comprehensive metabolic panel     Status: Abnormal   Collection Time: 06/15/22  4:37 AM  Result Value Ref Range   Sodium 134 (L) 135 - 145 mmol/L   Potassium 3.7 3.5 - 5.1 mmol/L   Chloride 103 98 - 111 mmol/L   CO2 26 22 - 32 mmol/L   Glucose, Bld 133 (H) 70 - 99 mg/dL    Comment:  Glucose reference range applies only to samples taken after fasting for at least 8 hours.   BUN 16 8 - 23 mg/dL   Creatinine, Ser 0.64 0.44 - 1.00 mg/dL   Calcium 8.3 (L) 8.9 - 10.3 mg/dL   Total Protein 6.7 6.5 - 8.1 g/dL   Albumin 3.3 (L) 3.5 - 5.0 g/dL   AST 17 15 - 41 U/L   ALT 15 0 - 44 U/L   Alkaline Phosphatase 65 38 - 126 U/L   Total Bilirubin 0.5 0.3 - 1.2 mg/dL  GFR, Estimated >60 >60 mL/min    Comment: (NOTE) Calculated using the CKD-EPI Creatinine Equation (2021)    Anion gap 5 5 - 15    Comment: Performed at [facility], [address], [place], [place] [address]  CBC     Status: Abnormal   Collection Time: 06/15/22  4:37 AM  Result Value Ref Range   WBC 7.3 4.0 - 10.5 K/uL   RBC 3.99 3.87 - 5.11 MIL/uL   Hemoglobin 11.7 (L) 12.0 - 15.0 g/dL   HCT 36.8 36.0 - 46.0 %   MCV 92.2 80.0 - 100.0 fL   MCH 29.3 26.0 - 34.0 pg   MCHC 31.8 30.0 - 36.0 g/dL   RDW 14.4 11.5 - 15.5 %   Platelets 218 150 - 400 K/uL   nRBC 0.0 0.0 - 0.2 %    Comment: Performed at [facility], [address], [place], [place] [address]  Magnesium     Status: None   Collection Time: 06/15/22  4:37 AM  Result Value Ref Range   Magnesium 2.1 1.7 - 2.4 mg/dL    Comment: Performed at [facility], [address], [place], [place] [address]  Phosphorus     Status: None   Collection Time: 06/15/22  4:37 AM  Result Value Ref Range   Phosphorus 3.0 2.5 - 4.6 mg/dL    Comment: Performed at [facility], [address] [place], [place] [address]  CBG monitoring, ED     Status: Abnormal   Collection Time: 06/15/22  7:44 AM  Result Value Ref Range   Glucose-Capillary 139 (H) 70 - 99 mg/dL    Comment: Glucose reference range applies only to samples taken after fasting for at least 8 hours.  CBG monitoring, ED     Status: Abnormal   Collection Time: 06/15/22 11:21 AM  Result Value Ref Range   Glucose-Capillary 141 (H) 70 - 99 mg/dL    Comment: Glucose reference range applies only to samples taken after  fasting for at least 8 hours.  CBG monitoring, ED     Status: Abnormal   Collection Time: 06/15/22  5:10 PM  Result Value Ref Range   Glucose-Capillary 173 (H) 70 - 99 mg/dL    Comment: Glucose reference range applies only to samples taken after fasting for at least 8 hours.  CBG monitoring, ED     Status: Abnormal   Collection Time: 06/15/22  9:15 PM  Result Value Ref Range   Glucose-Capillary 167 (H) 70 - 99 mg/dL    Comment: Glucose reference range applies only to samples taken after fasting for at least 8 hours.  CBG monitoring, ED     Status: Abnormal   Collection Time: 06/16/22  1:38 AM  Result Value Ref Range   Glucose-Capillary 165 (H) 70 - 99 mg/dL    Comment: Glucose reference range applies only to samples taken after fasting for at least 8 hours.  CBG monitoring, ED     Status: Abnormal   Collection Time: 06/16/22  4:26 AM  Result Value Ref Range   Glucose-Capillary 140 (H) 70 - 99 mg/dL    Comment: Glucose reference range applies only to samples taken after fasting for at least 8 hours.  Magnesium     Status: None   Collection Time: 06/16/22  7:19 AM  Result Value Ref Range   Magnesium 2.0 1.7 - 2.4 mg/dL    Comment: Performed at [facility], [address] [place],  [address]  Hemoglobin A1c  Status: Abnormal   Collection Time: 06/16/22  7:19 AM  Result Value Ref Range   Hgb A1c MFr Bld 6.7 (H) 4.8 - 5.6 %    Comment: (NOTE) Pre diabetes:          5.7%-6.4%  Diabetes:              >6.4%  Glycemic control for   <7.0% adults with diabetes    Mean Plasma Glucose 145.59 mg/dL    Comment: Performed at [facility] [address] [place], [place] [address]  Basic metabolic panel     Status: Abnormal   Collection Time: 06/16/22  7:19 AM  Result Value Ref Range   Sodium 134 (L) 135 - 145 mmol/L   Potassium 3.9 3.5 - 5.1 mmol/L   Chloride 102 98 - 111 mmol/L   CO2 22 22 - 32 mmol/L   Glucose, Bld 139 (H) 70 - 99 mg/dL    Comment: Glucose  reference range applies only to samples taken after fasting for at least 8 hours.   BUN 12 8 - 23 mg/dL   Creatinine, Ser 0.56 0.44 - 1.00 mg/dL   Calcium 8.4 (L) 8.9 - 10.3 mg/dL   GFR, Estimated >60 >60 mL/min    Comment: (NOTE) Calculated using the CKD-EPI Creatinine Equation (2021)    Anion gap 10 5 - 15    Comment: Performed at [facility], [address], [place], [place] [address]  CBC     Status: Abnormal   Collection Time: 06/16/22  7:19 AM  Result Value Ref Range   WBC 6.6 4.0 - 10.5 K/uL   RBC 3.88 3.87 - 5.11 MIL/uL   Hemoglobin 11.6 (L) 12.0 - 15.0 g/dL   HCT 35.6 (L) 36.0 - 46.0 %   MCV 91.8 80.0 - 100.0 fL   MCH 29.9 26.0 - 34.0 pg   MCHC 32.6 30.0 - 36.0 g/dL   RDW 14.5 11.5 - 15.5 %   Platelets 199 150 - 400 K/uL   nRBC 0.0 0.0 - 0.2 %    Comment: Performed at [facility], [address] [place], [place] [address]  CBG monitoring, ED     Status: Abnormal   Collection Time: 06/16/22  8:25 AM  Result Value Ref Range   Glucose-Capillary 138 (H) 70 - 99 mg/dL    Comment: Glucose reference range applies only to samples taken after fasting for at least 8 hours.  CBG monitoring, ED     Status: Abnormal   Collection Time: 06/16/22 11:21 AM  Result Value Ref Range   Glucose-Capillary 153 (H) 70 - 99 mg/dL    Comment: Glucose reference range applies only to samples taken after fasting for at least 8 hours.   Comment 1 Document in Chart   Glucose, capillary     Status: Abnormal   Collection Time: 06/16/22  5:14 PM  Result Value Ref Range   Glucose-Capillary 115 (H) 70 - 99 mg/dL    Comment: Glucose reference range applies only to samples taken after fasting for at least 8 hours.    CT Hip Left Wo Contrast  Result Date: 06/14/2022 CLINICAL DATA:  Known proximal left femoral fracture EXAM: CT OF THE LEFT HIP WITHOUT CONTRAST TECHNIQUE: Multidetector CT imaging of the left hip was performed according to the standard protocol. Multiplanar CT image reconstructions were  also generated. RADIATION DOSE REDUCTION: This exam was performed according to the departmental dose-optimization program which includes automated exposure control, adjustment of the mA  and/or kV according to patient size and/or use of iterative reconstruction technique. COMPARISON:  Plain film from earlier in the same day, CT from 02/03/2022 FINDINGS: Bones/Joint/Cartilage There is again noted a comminuted intratrochanteric fracture of the proximal left femur with impaction and mild angulation at the fracture site. The femoral head is well seated. No cortical abnormality is identified to suggest pathologic fracture. Increased soft tissue density is noted within the fracture site consistent with focal hemorrhage. There are changes consistent with prior insufficiency fracture of the sacrum. No other focal abnormality is noted. Ligaments Suboptimally assessed by CT. Muscles and Tendons Surrounding musculature appears within normal limits. Soft tissues Surrounding soft tissue structures demonstrate diffuse vascular calcifications. The pelvic structures appear within normal limits. IMPRESSION: Comminuted intratrochanteric fracture of the proximal left femur. No lytic or sclerotic foci are identified to suggest a pathologic fracture. Changes of prior insufficiency fracture in the left sacrum No other focal abnormality is noted. Electronically Signed   By: Inez Catalina M.D.   On: 06/14/2022 21:12    Review of Systems Blood pressure (!) 154/63, pulse 88, temperature 98 F (36.7 C), temperature source Oral, resp. rate 18, height '5\' 4"'$  (1.626 m), weight 81.2 kg, SpO2 98 %. Physical Exam Vitals reviewed.  Constitutional:      Appearance: Normal appearance.  HENT:     Head: Normocephalic and atraumatic.  Cardiovascular:     Rate and Rhythm: Normal rate and regular rhythm.     Pulses: Normal pulses.  Pulmonary:     Effort: Pulmonary effort is normal.     Breath sounds: Normal breath sounds.  Abdominal:      Palpations: Abdomen is soft.  Musculoskeletal:     Cervical back: Normal range of motion and neck supple.     Left hip: Deformity, tenderness and bony tenderness present. Decreased range of motion. Decreased strength.  Neurological:     Mental Status: She is alert and oriented to person, place, and time.  Psychiatric:        Behavior: Behavior normal.     Assessment/Plan: Left hip with complex displaced intertrochanteric femur fracture  The patient will need to be n.p.o. after midnight tonight.  The plan is for surgery tomorrow at some point to address her left hip fracture.  This has been discussed with the patient in detail.  Mcarthur Rossetti 06/16/2022, 6:52 PM

## 2022-06-16 NOTE — Progress Notes (Signed)
[facility] night cross cover note:  I was notified by RN that the patient wishes to update her code status to DNR. Specifically, the patient conveys she would not want chest compressions, antiarrhythmics, electrical shocks, or intubation in the setting of cardiac arrest. Based upon these conveyed wishes, I have updated the patient's code status to DNR.     Babs Bertin, DO Hospitalist

## 2022-06-16 NOTE — Progress Notes (Addendum)
PROGRESS NOTE  Jacqueline Bird DDU:[id number] DOB: 07-31-1950 DOA: 06/14/2022 PCP: Celene Squibb, MD  Brief History:  72 year old female with a history of diabetes mellitus type 2, hypertension, hyperlipidemia, squamous cell carcinoma of the anus (stage IIIc) in remission,, left lower extremity DVT on apixaban presenting after mechanical fall at home.  The patient was getting up from a chair when she stepped on a foreign object on the floor and slipped and fell.  The patient denies any loss of consciousness or hitting her head.  She was unable to get up.  EMS was activated.  Prior to the fall, the patient had been in her usual state of health without any complaints.  Patient denies fevers, chills, headache, chest pain, dyspnea, nausea, vomiting, diarrhea, abdominal pain, dysuria, hematuria, hematochezia, and melena. In the ED, the patient was afebrile and hemodynamically stable with oxygen saturation 98% on room air.  WBC 5.4, hemoglobin 13.1, platelets 221,000.  Sodium 137, potassium 3.8, bicarbonate 28, serum creatinine 0.73.  LFTs were unremarkable.  CT of the left hip shows a comminuted anterior trochanteric fracture of the proximal left femur.  There is no lytic lesions or sclerosis to suggest a pathologic fracture.  Orthopedic surgery, Dr. Jean Rosenthal was consulted and recommended transfer to [facility] for operative intervention.    1/30--pt remains medically stable last 24 hours.  No new complaints.  Pain better controlled with prn hydromorphone.  NPO since MN awaiting transfer to [facility]   Assessment/Plan: Left femur intertrochanteric fracture -Orthopedic surgery, Dr. Waymon Amato to [facility] for operative repair -Physical therapy after operative repair -continue IV dilaudid 0.5 mg q3 prn severe pain -Personally reviewed EKG--sinus rhythm, no ST-T wave change -Personally reviewed chest x-ray--no infiltrates or edema -IV robaxin prn muscle spasm   Controlled diabetes  mellitus type 2 -NovoLog sliding scale -Holding Tresiba temporarily -Holding Trulicity -check C3J   Squamous cell carcinoma of the anus -Currently in remission -Follow-up Dr. Delton Coombes -Status post XRT/5-FU/mitomycin   Elevated lipase -Not clinically significant presently -No abdominal pain or vomiting -Normal LFTs   Urine Retention -distended bladder on exam with pain -foley placed in while in ED 06/15/22   Mixed hyperlipidemia -Continue statin   History of left lower extremity DVT -Holding apixaban in preparation for surgery -Last apixaban dose--06/14/22 AM   Obesity -BMI 30.73 -lifestyle modification     Family Communication:  no Family at bedside   Consultants:  ortho--Dr. Ninfa Linden   Code Status:  FULL    DVT Prophylaxis: apixaban on hold--last dose AM 06/14/22     Procedures: As Listed in Progress Note Above   Antibiotics: None     Subjective: Pt complains of muscle spasm in left hip area.  Patient denies fevers, chills, headache, chest pain, dyspnea, nausea, vomiting, diarrhea, abdominal pain, dysuria, hematuria, hematochezia, and melena.   Objective: Vitals:   06/16/22 0330 06/16/22 0400 06/16/22 0530 06/16/22 0600  BP: 131/67 (!) 153/70 113/84 (!) 143/73  Pulse:  86  84  Resp: '17 19 19 17  '$ Temp:      TempSrc:      SpO2:  97%  96%  Weight:      Height:        Intake/Output Summary (Last 24 hours) at 06/16/2022 0717 Last data filed at 06/16/2022 0431 Gross per 24 hour  Intake --  Output 1900 ml  Net -1900 ml   Weight change:  Exam:  General:  Pt is alert, follows commands  appropriately, not in acute distress HEENT: No icterus, No thrush, No neck mass, [place]/AT Cardiovascular: RRR, S1/S2, no rubs, no gallops Respiratory: CTA bilaterally, no wheezing, no crackles, no rhonchi Abdomen: Soft/+BS, non tender, non distended, no guarding Extremities: trace LLE edema, No lymphangitis, No petechiae, No rashes, no synovitis   Data Reviewed: I  have personally reviewed following labs and imaging studies Basic Metabolic Panel: Recent Labs  Lab 06/14/22 1730 06/15/22 0437  NA 137 134*  K 3.8 3.7  CL 101 103  CO2 28 26  GLUCOSE 185* 133*  BUN 18 16  CREATININE 0.73 0.64  CALCIUM 8.6* 8.3*  MG  --  2.1  PHOS  --  3.0   Liver Function Tests: Recent Labs  Lab 06/14/22 1730 06/15/22 0437  AST 18 17  ALT 17 15  ALKPHOS 85 65  BILITOT 0.4 0.5  PROT 6.8 6.7  ALBUMIN 3.4* 3.3*   Recent Labs  Lab 06/14/22 1730 06/15/22 0437  LIPASE 55* 36   No results for input(s): "AMMONIA" in the last 168 hours. Coagulation Profile: Recent Labs  Lab 06/14/22 1730  INR 1.1   CBC: Recent Labs  Lab 06/14/22 1730 06/15/22 0437  WBC 5.4 7.3  NEUTROABS 3.8  --   HGB 13.1 11.7*  HCT 40.8 36.8  MCV 91.1 92.2  PLT 221 218   Cardiac Enzymes: No results for input(s): "CKTOTAL", "CKMB", "CKMBINDEX", "TROPONINI" in the last 168 hours. BNP: Invalid input(s): "POCBNP" CBG: Recent Labs  Lab 06/15/22 1121 06/15/22 1710 06/15/22 2115 06/16/22 0138 06/16/22 0426  GLUCAP 141* 173* 167* 165* 140*   HbA1C: Recent Labs    06/14/22 1730  HGBA1C 6.9*   Urine analysis: No results found for: "COLORURINE", "APPEARANCEUR", "LABSPEC", "PHURINE", "GLUCOSEU", "HGBUR", "BILIRUBINUR", "KETONESUR", "PROTEINUR", "UROBILINOGEN", "NITRITE", "LEUKOCYTESUR" Sepsis Labs: '@LABRCNTIP'$ (procalcitonin:4,lacticidven:4) )No results found for this or any previous visit (from the past 240 hour(s)).   Scheduled Meds:  ALPRAZolam  0.5 mg Oral QHS   atorvastatin  80 mg Oral QPM   cyclobenzaprine  5 mg Oral Once   gabapentin  100 mg Oral QHS   gabapentin  200 mg Oral Daily   insulin aspart  0-5 Units Subcutaneous QHS   insulin aspart  0-9 Units Subcutaneous TID WC   ondansetron  4 mg Intravenous Once   sertraline  100 mg Oral Daily   traZODone  100 mg Oral QHS   Continuous Infusions:  methocarbamol (ROBAXIN) IV      Procedures/Studies: CT  Hip Left Wo Contrast  Result Date: 06/14/2022 CLINICAL DATA:  Known proximal left femoral fracture EXAM: CT OF THE LEFT HIP WITHOUT CONTRAST TECHNIQUE: Multidetector CT imaging of the left hip was performed according to the standard protocol. Multiplanar CT image reconstructions were also generated. RADIATION DOSE REDUCTION: This exam was performed according to the departmental dose-optimization program which includes automated exposure control, adjustment of the mA and/or kV according to patient size and/or use of iterative reconstruction technique. COMPARISON:  Plain film from earlier in the same day, CT from 02/03/2022 FINDINGS: Bones/Joint/Cartilage There is again noted a comminuted intratrochanteric fracture of the proximal left femur with impaction and mild angulation at the fracture site. The femoral head is well seated. No cortical abnormality is identified to suggest pathologic fracture. Increased soft tissue density is noted within the fracture site consistent with focal hemorrhage. There are changes consistent with prior insufficiency fracture of the sacrum. No other focal abnormality is noted. Ligaments Suboptimally assessed by CT. Muscles and Tendons Surrounding musculature appears within  normal limits. Soft tissues Surrounding soft tissue structures demonstrate diffuse vascular calcifications. The pelvic structures appear within normal limits. IMPRESSION: Comminuted intratrochanteric fracture of the proximal left femur. No lytic or sclerotic foci are identified to suggest a pathologic fracture. Changes of prior insufficiency fracture in the left sacrum No other focal abnormality is noted. Electronically Signed   By: Inez Catalina M.D.   On: 06/14/2022 21:12   DG Chest 1 View  Result Date: 06/14/2022 CLINICAL DATA:  Fall. EXAM: CHEST  1 VIEW COMPARISON:  Oct 16, 2019. FINDINGS: The heart size and mediastinal contours are within normal limits. Both lungs are clear. Left internal jugular catheter is  noted with distal tip in expected position of SVC. The visualized skeletal structures are unremarkable. IMPRESSION: No active disease. Electronically Signed   By: Marijo Conception M.D.   On: 06/14/2022 18:54   DG Hip Unilat With Pelvis 2-3 Views Left  Result Date: 06/14/2022 CLINICAL DATA:  Fall. EXAM: DG HIP (WITH OR WITHOUT PELVIS) 2-3V LEFT COMPARISON:  None Available. FINDINGS: Severely comminuted and displaced fracture is seen involving the intertrochanteric region of the proximal left femur. IMPRESSION: Severely comminuted and displaced intertrochanteric fracture of proximal left femur. Electronically Signed   By: Marijo Conception M.D.   On: 06/14/2022 18:53    Orson Eva, DO  [facility]  If 7PM-7AM, please contact night-coverage [email or web address] Password [email or web address] 06/16/2022, 7:17 AM   LOS: 2 days

## 2022-06-16 NOTE — Progress Notes (Signed)
Skin assessment was complete anterior only due to patient refusing to turn due to her Fx of the left leg and is in severe pain.

## 2022-06-17 ENCOUNTER — Inpatient Hospital Stay (HOSPITAL_COMMUNITY): Payer: Medicare HMO

## 2022-06-17 ENCOUNTER — Encounter (HOSPITAL_COMMUNITY)
Admission: EM | Disposition: A | Discharge status: Skilled Nursing Facility | Payer: Self-pay | Source: Home / Self Care | Attending: Family Medicine

## 2022-06-17 ENCOUNTER — Encounter (HOSPITAL_COMMUNITY): Payer: Self-pay | Admitting: Internal Medicine

## 2022-06-17 ENCOUNTER — Inpatient Hospital Stay (HOSPITAL_COMMUNITY): Payer: Medicare HMO | Admitting: Certified Registered"

## 2022-06-17 DIAGNOSIS — I509 Heart failure, unspecified: Secondary | ICD-10-CM

## 2022-06-17 DIAGNOSIS — E119 Type 2 diabetes mellitus without complications: Secondary | ICD-10-CM | POA: Diagnosis not present

## 2022-06-17 DIAGNOSIS — I110 Hypertensive heart disease with heart failure: Secondary | ICD-10-CM | POA: Diagnosis not present

## 2022-06-17 DIAGNOSIS — F418 Other specified anxiety disorders: Secondary | ICD-10-CM

## 2022-06-17 DIAGNOSIS — S72142G Displaced intertrochanteric fracture of left femur, subsequent encounter for closed fracture with delayed healing: Secondary | ICD-10-CM

## 2022-06-17 DIAGNOSIS — S72142A Displaced intertrochanteric fracture of left femur, initial encounter for closed fracture: Secondary | ICD-10-CM

## 2022-06-17 HISTORY — PX: INTRAMEDULLARY (IM) NAIL INTERTROCHANTERIC: SHX5875

## 2022-06-17 LAB — GLUCOSE, CAPILLARY
Glucose-Capillary: 125 mg/dL — ABNORMAL HIGH (ref 70–99)
Glucose-Capillary: 126 mg/dL — ABNORMAL HIGH (ref 70–99)
Glucose-Capillary: 127 mg/dL — ABNORMAL HIGH (ref 70–99)
Glucose-Capillary: 136 mg/dL — ABNORMAL HIGH (ref 70–99)
Glucose-Capillary: 140 mg/dL — ABNORMAL HIGH (ref 70–99)
Glucose-Capillary: 141 mg/dL — ABNORMAL HIGH (ref 70–99)
Glucose-Capillary: 146 mg/dL — ABNORMAL HIGH (ref 70–99)
Glucose-Capillary: 248 mg/dL — ABNORMAL HIGH (ref 70–99)

## 2022-06-17 LAB — SURGICAL PCR SCREEN
MRSA, PCR: NEGATIVE
Staphylococcus aureus: NEGATIVE

## 2022-06-17 SURGERY — FIXATION, FRACTURE, INTERTROCHANTERIC, WITH INTRAMEDULLARY ROD
Anesthesia: General | Laterality: Left

## 2022-06-17 MED ORDER — LIDOCAINE 2% (20 MG/ML) 5 ML SYRINGE
INTRAMUSCULAR | Status: AC
  Filled 2022-06-17: qty 5

## 2022-06-17 MED ORDER — METHOCARBAMOL 500 MG PO TABS
500.0000 mg | ORAL_TABLET | Freq: Four times a day (QID) | ORAL | Status: DC | PRN
  Administered 2022-06-18 – 2022-06-22 (×5): 500 mg via ORAL
  Filled 2022-06-17 (×5): qty 1

## 2022-06-17 MED ORDER — TRANEXAMIC ACID-NACL 1000-0.7 MG/100ML-% IV SOLN
1000.0000 mg | Freq: Once | INTRAVENOUS | Status: DC
  Filled 2022-06-17: qty 100

## 2022-06-17 MED ORDER — TRANEXAMIC ACID 1000 MG/10ML IV SOLN
INTRAVENOUS | Status: DC | PRN
  Administered 2022-06-17: 2000 mg via TOPICAL

## 2022-06-17 MED ORDER — ROCURONIUM BROMIDE 10 MG/ML (PF) SYRINGE
PREFILLED_SYRINGE | INTRAVENOUS | Status: DC | PRN
  Administered 2022-06-17: 80 mg via INTRAVENOUS

## 2022-06-17 MED ORDER — MIDAZOLAM HCL 2 MG/2ML IJ SOLN
INTRAMUSCULAR | Status: AC
  Filled 2022-06-17: qty 2

## 2022-06-17 MED ORDER — DEXAMETHASONE SODIUM PHOSPHATE 10 MG/ML IJ SOLN
INTRAMUSCULAR | Status: AC
  Filled 2022-06-17: qty 1

## 2022-06-17 MED ORDER — DOCUSATE SODIUM 100 MG PO CAPS
100.0000 mg | ORAL_CAPSULE | Freq: Two times a day (BID) | ORAL | Status: DC
  Administered 2022-06-17: 100 mg via ORAL
  Filled 2022-06-17 (×2): qty 1

## 2022-06-17 MED ORDER — HYDROMORPHONE HCL 1 MG/ML IJ SOLN
0.5000 mg | INTRAMUSCULAR | Status: DC | PRN
  Administered 2022-06-17: 1 mg via INTRAVENOUS
  Filled 2022-06-17: qty 1

## 2022-06-17 MED ORDER — PHENOL 1.4 % MT LIQD: 1.0000 | OROMUCOSAL | Status: DC | PRN

## 2022-06-17 MED ORDER — ALUM & MAG HYDROXIDE-SIMETH 200-200-20 MG/5ML PO SUSP
30.0000 mL | ORAL | Status: DC | PRN
  Administered 2022-06-18: 30 mL via ORAL
  Filled 2022-06-17: qty 30

## 2022-06-17 MED ORDER — MAGNESIUM CITRATE PO SOLN: 1.0000 | Freq: Once | ORAL | Status: DC | PRN

## 2022-06-17 MED ORDER — PHENYLEPHRINE 80 MCG/ML (10ML) SYRINGE FOR IV PUSH (FOR BLOOD PRESSURE SUPPORT)
PREFILLED_SYRINGE | INTRAVENOUS | Status: DC | PRN
  Administered 2022-06-17 (×2): 240 ug via INTRAVENOUS
  Administered 2022-06-17: 160 ug via INTRAVENOUS

## 2022-06-17 MED ORDER — CEFAZOLIN SODIUM-DEXTROSE 2-4 GM/100ML-% IV SOLN
2.0000 g | Freq: Once | INTRAVENOUS | Status: AC
  Administered 2022-06-17: 2 g via INTRAVENOUS

## 2022-06-17 MED ORDER — HYDROMORPHONE HCL 1 MG/ML IJ SOLN
0.2500 mg | INTRAMUSCULAR | Status: DC | PRN
  Administered 2022-06-17 (×2): 0.25 mg via INTRAVENOUS
  Administered 2022-06-17: 0.5 mg via INTRAVENOUS

## 2022-06-17 MED ORDER — FENTANYL CITRATE (PF) 250 MCG/5ML IJ SOLN
INTRAMUSCULAR | Status: AC
  Filled 2022-06-17: qty 5

## 2022-06-17 MED ORDER — ONDANSETRON HCL 4 MG/2ML IJ SOLN
INTRAMUSCULAR | Status: DC | PRN
  Administered 2022-06-17: 4 mg via INTRAVENOUS

## 2022-06-17 MED ORDER — ONDANSETRON HCL 4 MG/2ML IJ SOLN: 4.0000 mg | Freq: Four times a day (QID) | INTRAMUSCULAR | Status: DC | PRN

## 2022-06-17 MED ORDER — MENTHOL 3 MG MT LOZG: 1.0000 | LOZENGE | OROMUCOSAL | Status: DC | PRN

## 2022-06-17 MED ORDER — CHLORHEXIDINE GLUCONATE 0.12 % MT SOLN
OROMUCOSAL | Status: AC
  Administered 2022-06-17: 15 mL via OROMUCOSAL
  Filled 2022-06-17: qty 15

## 2022-06-17 MED ORDER — CEFAZOLIN SODIUM-DEXTROSE 2-4 GM/100ML-% IV SOLN
INTRAVENOUS | Status: AC
  Filled 2022-06-17: qty 100

## 2022-06-17 MED ORDER — TRANEXAMIC ACID 1000 MG/10ML IV SOLN
2000.0000 mg | Freq: Once | INTRAVENOUS | Status: DC
  Filled 2022-06-17 (×2): qty 20

## 2022-06-17 MED ORDER — ROCURONIUM BROMIDE 10 MG/ML (PF) SYRINGE
PREFILLED_SYRINGE | INTRAVENOUS | Status: AC
  Filled 2022-06-17: qty 10

## 2022-06-17 MED ORDER — CEFAZOLIN SODIUM-DEXTROSE 2-4 GM/100ML-% IV SOLN
2.0000 g | Freq: Four times a day (QID) | INTRAVENOUS | Status: AC
  Administered 2022-06-17 – 2022-06-18 (×3): 2 g via INTRAVENOUS
  Filled 2022-06-17 (×3): qty 100

## 2022-06-17 MED ORDER — TRANEXAMIC ACID-NACL 1000-0.7 MG/100ML-% IV SOLN
INTRAVENOUS | Status: AC
  Filled 2022-06-17: qty 100

## 2022-06-17 MED ORDER — METHOCARBAMOL 1000 MG/10ML IJ SOLN: 500.0000 mg | Freq: Four times a day (QID) | INTRAVENOUS | Status: DC | PRN

## 2022-06-17 MED ORDER — ONDANSETRON HCL 4 MG PO TABS: 4.0000 mg | ORAL_TABLET | Freq: Four times a day (QID) | ORAL | Status: DC | PRN

## 2022-06-17 MED ORDER — ACETAMINOPHEN 500 MG PO TABS
1000.0000 mg | ORAL_TABLET | Freq: Four times a day (QID) | ORAL | Status: AC
  Administered 2022-06-17 – 2022-06-18 (×4): 1000 mg via ORAL
  Filled 2022-06-17 (×4): qty 2

## 2022-06-17 MED ORDER — ACETAMINOPHEN 325 MG PO TABS
325.0000 mg | ORAL_TABLET | Freq: Four times a day (QID) | ORAL | Status: DC | PRN
  Administered 2022-06-20 – 2022-06-22 (×5): 650 mg via ORAL
  Filled 2022-06-17 (×5): qty 2

## 2022-06-17 MED ORDER — SUGAMMADEX SODIUM 200 MG/2ML IV SOLN
INTRAVENOUS | Status: DC | PRN
  Administered 2022-06-17: 200 mg via INTRAVENOUS

## 2022-06-17 MED ORDER — PROPOFOL 10 MG/ML IV BOLUS
INTRAVENOUS | Status: DC | PRN
  Administered 2022-06-17: 130 mg via INTRAVENOUS

## 2022-06-17 MED ORDER — PROMETHAZINE HCL 25 MG/ML IJ SOLN: 6.2500 mg | INTRAMUSCULAR | Status: DC | PRN

## 2022-06-17 MED ORDER — PROPOFOL 10 MG/ML IV BOLUS
INTRAVENOUS | Status: AC
  Filled 2022-06-17: qty 20

## 2022-06-17 MED ORDER — OXYCODONE HCL 5 MG PO TABS
10.0000 mg | ORAL_TABLET | ORAL | Status: DC | PRN
  Administered 2022-06-21: 10 mg via ORAL

## 2022-06-17 MED ORDER — FENTANYL CITRATE (PF) 100 MCG/2ML IJ SOLN
INTRAMUSCULAR | Status: DC | PRN
  Administered 2022-06-17: 100 ug via INTRAVENOUS
  Administered 2022-06-17: 150 ug via INTRAVENOUS

## 2022-06-17 MED ORDER — HYDROMORPHONE HCL 1 MG/ML IJ SOLN
0.5000 mg | INTRAMUSCULAR | Status: DC | PRN
  Administered 2022-06-18 – 2022-06-19 (×4): 1 mg via INTRAVENOUS
  Filled 2022-06-17 (×4): qty 1

## 2022-06-17 MED ORDER — ONDANSETRON HCL 4 MG/2ML IJ SOLN
INTRAMUSCULAR | Status: AC
  Filled 2022-06-17: qty 2

## 2022-06-17 MED ORDER — POLYETHYLENE GLYCOL 3350 17 G PO PACK: 17.0000 g | PACK | Freq: Every day | ORAL | Status: DC | PRN

## 2022-06-17 MED ORDER — AMISULPRIDE (ANTIEMETIC) 5 MG/2ML IV SOLN: 10.0000 mg | Freq: Once | INTRAVENOUS | Status: DC | PRN

## 2022-06-17 MED ORDER — ORAL CARE MOUTH RINSE: 15.0000 mL | Freq: Once | OROMUCOSAL | Status: AC

## 2022-06-17 MED ORDER — HYDROMORPHONE HCL 1 MG/ML IJ SOLN
INTRAMUSCULAR | Status: AC
  Filled 2022-06-17: qty 1

## 2022-06-17 MED ORDER — INSULIN ASPART 100 UNIT/ML IJ SOLN: 0.0000 [IU] | INTRAMUSCULAR | Status: DC | PRN

## 2022-06-17 MED ORDER — OXYCODONE HCL 5 MG PO TABS
5.0000 mg | ORAL_TABLET | ORAL | Status: DC | PRN
  Administered 2022-06-18 – 2022-06-19 (×2): 10 mg via ORAL
  Administered 2022-06-19: 5 mg via ORAL
  Administered 2022-06-22 (×2): 10 mg via ORAL
  Administered 2022-06-23: 5 mg via ORAL
  Filled 2022-06-17 (×3): qty 2
  Filled 2022-06-17: qty 1
  Filled 2022-06-17: qty 2
  Filled 2022-06-17: qty 1
  Filled 2022-06-17 (×2): qty 2

## 2022-06-17 MED ORDER — CHLORHEXIDINE GLUCONATE 0.12 % MT SOLN: 15.0000 mL | Freq: Once | OROMUCOSAL | Status: AC

## 2022-06-17 MED ORDER — TRANEXAMIC ACID-NACL 1000-0.7 MG/100ML-% IV SOLN
1000.0000 mg | INTRAVENOUS | Status: AC
  Administered 2022-06-17: 1000 mg via INTRAVENOUS

## 2022-06-17 MED ORDER — MIDAZOLAM HCL 2 MG/2ML IJ SOLN
INTRAMUSCULAR | Status: DC | PRN
  Administered 2022-06-17: 2 mg via INTRAVENOUS

## 2022-06-17 MED ORDER — PHENYLEPHRINE 80 MCG/ML (10ML) SYRINGE FOR IV PUSH (FOR BLOOD PRESSURE SUPPORT)
PREFILLED_SYRINGE | INTRAVENOUS | Status: AC
  Filled 2022-06-17: qty 10

## 2022-06-17 MED ORDER — LIDOCAINE 2% (20 MG/ML) 5 ML SYRINGE
INTRAMUSCULAR | Status: DC | PRN
  Administered 2022-06-17: 80 mg via INTRAVENOUS

## 2022-06-17 MED ORDER — SORBITOL 70 % SOLN: 30.0000 mL | Freq: Every day | Status: DC | PRN

## 2022-06-17 MED ORDER — OXYCODONE HCL 5 MG/5ML PO SOLN: 5.0000 mg | Freq: Once | ORAL | Status: DC | PRN

## 2022-06-17 MED ORDER — OXYCODONE HCL 5 MG PO TABS: 5.0000 mg | ORAL_TABLET | Freq: Once | ORAL | Status: DC | PRN

## 2022-06-17 MED ORDER — LACTATED RINGERS IV SOLN: INTRAVENOUS | Status: DC

## 2022-06-17 MED ORDER — SODIUM CHLORIDE 0.9 % IV SOLN: INTRAVENOUS | Status: DC

## 2022-06-17 MED ORDER — DEXTROSE 5 % IV SOLN: INTRAVENOUS | Status: DC

## 2022-06-17 MED ORDER — DEXAMETHASONE SODIUM PHOSPHATE 10 MG/ML IJ SOLN
INTRAMUSCULAR | Status: DC | PRN
  Administered 2022-06-17: 5 mg via INTRAVENOUS

## 2022-06-17 SURGICAL SUPPLY — 36 items
BIT DRILL INTERTAN LAG SCREW (BIT) IMPLANT
BIT DRILL SHORT 4.0 (BIT) IMPLANT
BNDG COHESIVE 6X5 TAN STRL LF (GAUZE/BANDAGES/DRESSINGS) IMPLANT
COVER PERINEAL POST (MISCELLANEOUS) ×1 IMPLANT
COVER SURGICAL LIGHT HANDLE (MISCELLANEOUS) ×1 IMPLANT
DRAPE C-ARMOR (DRAPES) ×1 IMPLANT
DRAPE STERI IOBAN 125X83 (DRAPES) ×1 IMPLANT
DRESSING MEPILEX FLEX 4X4 (GAUZE/BANDAGES/DRESSINGS) IMPLANT
DRILL BIT SHORT 4.0 (BIT) ×1
DRSG MEPILEX FLEX 4X4 (GAUZE/BANDAGES/DRESSINGS) ×1
DURAPREP 26ML APPLICATOR (WOUND CARE) ×1 IMPLANT
ELECT REM PT RETURN 9FT ADLT (ELECTROSURGICAL) ×1
ELECTRODE REM PT RTRN 9FT ADLT (ELECTROSURGICAL) ×1 IMPLANT
GLOVE BIOGEL PI IND STRL 7.5 (GLOVE) ×1 IMPLANT
GLOVE ECLIPSE 7.0 STRL STRAW (GLOVE) ×1 IMPLANT
GLOVE SKINSENSE STRL SZ7.5 (GLOVE) ×2 IMPLANT
GLOVE SURG UNDER POLY LF SZ7 (GLOVE) ×19 IMPLANT
GLOVE SURG UNDER POLY LF SZ7.5 (GLOVE) ×4 IMPLANT
GOWN STRL REIN XL XLG (GOWN DISPOSABLE) ×1 IMPLANT
GUIDE PIN 3.2X343 (PIN) ×2
GUIDE PIN 3.2X343MM (PIN) ×2
KIT BASIN OR (CUSTOM PROCEDURE TRAY) ×1 IMPLANT
KIT TURNOVER KIT B (KITS) ×1 IMPLANT
NAIL LEFT 10X36 (Nail) IMPLANT
NS IRRIG 1000ML POUR BTL (IV SOLUTION) ×1 IMPLANT
PACK GENERAL/GYN (CUSTOM PROCEDURE TRAY) ×1 IMPLANT
PAD ARMBOARD 7.5X6 YLW CONV (MISCELLANEOUS) ×2 IMPLANT
PIN GUIDE 3.2X343MM (PIN) IMPLANT
SCREW LAG COMPR KIT 85/80 (Screw) IMPLANT
SCREW TRIGEN LOW PROF 5.0X42.5 (Screw) IMPLANT
STAPLER VISISTAT 35W (STAPLE) ×1 IMPLANT
SUT VIC AB 0 CT1 27 (SUTURE) ×1
SUT VIC AB 0 CT1 27XBRD ANBCTR (SUTURE) IMPLANT
SUT VIC AB 2-0 CT1 27 (SUTURE) ×2
SUT VIC AB 2-0 CT1 TAPERPNT 27 (SUTURE) IMPLANT
TOWEL GREEN STERILE FF (TOWEL DISPOSABLE) ×1 IMPLANT

## 2022-06-17 NOTE — H&P (Signed)

## 2022-06-17 NOTE — TOC CAGE-AID Note (Signed)
Transition of Care [facility]) - CAGE-AID Screening   Patient Details  Name: Jacqueline Bird MRN: [id number] Date of Birth: 05/24/1950  Transition of Care [facility]) CM/SW Contact:    Army Melia, RN Phone Number:[phone] 06/17/2022, 9:35 PM   Clinical Narrative: No hx of drug/alcohol use, no resources indicated   CAGE-AID Screening:    Have You Ever Felt You Ought to Cut Down on Your Drinking or Drug Use?: No Have People Annoyed You By Critizing Your Drinking Or Drug Use?: No Have You Felt Bad Or Guilty About Your Drinking Or Drug Use?: No Have You Ever Had a Drink or Used Drugs First Thing In The Morning to Steady Your Nerves or to Get Rid of a Hangover?: No CAGE-AID Score: 0  Substance Abuse Education Offered: No (no resources indicated)

## 2022-06-17 NOTE — Progress Notes (Signed)
Patient ID: Jacqueline Bird, female   DOB: 04-08-1951, 71 y.o.   MRN: [id number] The patient is awake and alert.  She is n.p.o. in anticipation of surgery today to address her left hip intertrochanteric fracture.  This is a complex fracture.  I have reached out to my orthopedic colleagues in terms of seeing if someone can get her to surgery much sooner today then for me this evening.  That is a possibility.  I explained this to her in detail.  The risks and benefits of surgery were discussed as well and the rationale behind proceeding with surgery.

## 2022-06-17 NOTE — Transfer of Care (Signed)
Immediate Anesthesia Transfer of Care Note  Patient: Jacqueline Bird  Procedure(s) Performed: ORIF LEFT HIP INTERTROCHANTERIC FRACTURE (Left)  Patient Location: PACU  Anesthesia Type:General  Level of Consciousness: drowsy  Airway & Oxygen Therapy: Patient Spontanous Breathing and Patient connected to nasal cannula oxygen  Post-op Assessment: Report given to RN  Post vital signs: Reviewed and stable  Last Vitals:  Vitals Value Taken Time  BP 155/55 06/17/22 1541  Temp    Pulse 88 06/17/22 1542  Resp 17 06/17/22 1542  SpO2 98 % 06/17/22 1542  Vitals shown include unvalidated device data.  Last Pain:  Vitals:   06/17/22 1343  TempSrc: Oral  PainSc: 0-No pain      Patients Stated Pain Goal: 2 (38/88/75 7972)  Complications: No notable events documented.

## 2022-06-17 NOTE — Anesthesia Preprocedure Evaluation (Signed)
Anesthesia Evaluation  Patient identified by MRN, date of birth, ID band Patient awake    Reviewed: Allergy & Precautions, NPO status , Patient's Chart, lab work & pertinent test results  Airway Mallampati: II  TM Distance: >3 FB Neck ROM: Full    Dental no notable dental hx.    Pulmonary neg pulmonary ROS   Pulmonary exam normal breath sounds clear to auscultation       Cardiovascular hypertension, Pt. on medications +CHF  Normal cardiovascular exam Rhythm:Regular Rate:Normal     Neuro/Psych   Anxiety Depression    negative neurological ROS     GI/Hepatic negative GI ROS, Neg liver ROS,,,  Endo/Other  diabetes, Type 2    Renal/GU      Musculoskeletal   Abdominal   Peds  Hematology   Anesthesia Other Findings   Reproductive/Obstetrics                             Anesthesia Physical Anesthesia Plan  ASA: III  Anesthesia Plan: General   Post-op Pain Management: Gabapentin PO (pre-op)* and Dilaudid IV   Induction: Intravenous  PONV Risk Score and Plan: 3 and Ondansetron, Dexamethasone, Midazolam and Treatment may vary due to age or medical condition  Airway Management Planned: Oral ETT  Additional Equipment:   Intra-op Plan:   Post-operative Plan: Extubation in OR  Informed Consent: I have reviewed the patients History and Physical, chart, labs and discussed the procedure including the risks, benefits and alternatives for the proposed anesthesia with the patient or authorized representative who has indicated his/her understanding and acceptance.   Patient has DNR.  Discussed DNR with patient and Suspend DNR.   Dental advisory given  Plan Discussed with: CRNA and Anesthesiologist  Anesthesia Plan Comments: (PAT note by Karoline Caldwell, PA-C: Follows with cardiology for hx of diastolic CHF and risk factor modification. Last seen by Dr. Domenic Polite 10/06/19. Stable per note. No  anginal symptoms. Recommended 6 month followup.   IDDM 2, last A1c in epic was 5.7 on 05/22/2019.  Will need day of surgery labs and evaluation.  EKG 10/16/2019: Sinus rhythm with frequent Premature ventricular complexes.  Rate 87. Nonspecific T wave abnormality Prolonged QT (QTC 486)  TTE 05/22/2019: 1. Left ventricular ejection fraction, by visual estimation, is 50 to  55%. The left ventricle has low normal function. There is no left  ventricular hypertrophy.  2. Basal inferolateral segment and basal inferior segment are abnormal.  3. Left ventricular diastolic parameters are consistent with Grade I  diastolic dysfunction (impaired relaxation).  4. The left ventricle demonstrates regional wall motion abnormalities.  5. Global right ventricle has normal systolic function.The right  ventricular size is normal. No increase in right ventricular wall  thickness.  6. Left atrial size was moderately dilated.  7. Right atrial size was normal.  8. The mitral valve is grossly normal. Mild mitral valve regurgitation.  9. The tricuspid valve is grossly normal.  10. The aortic valve is tricuspid. Aortic valve regurgitation is not  visualized.  11. The pulmonic valve was grossly normal. Pulmonic valve regurgitation is  trivial.  12. Mildly elevated pulmonary artery systolic pressure.  13. The tricuspid regurgitant velocity is 2.30 m/s, and with an assumed  right atrial pressure of 15 mmHg, the estimated right ventricular systolic  pressure is mildly elevated at 36.2 mmHg.  14. The inferior vena cava is dilated in size with <50% respiratory  variability, suggesting right atrial pressure of  15 mmHg.  )        Anesthesia Quick Evaluation

## 2022-06-17 NOTE — Anesthesia Procedure Notes (Signed)
Procedure Name: Intubation Date/Time: 06/17/2022 2:27 PM  Performed by: Barrington Ellison, CRNAPre-anesthesia Checklist: Patient identified, Emergency Drugs available, Suction available and Patient being monitored Patient Re-evaluated:Patient Re-evaluated prior to induction Oxygen Delivery Method: Circle System Utilized Preoxygenation: Pre-oxygenation with 100% oxygen Induction Type: IV induction Ventilation: Mask ventilation without difficulty Laryngoscope Size: Mac and 3 Grade View: Grade I Tube type: Oral Tube size: 7.0 mm Number of attempts: 1 Airway Equipment and Method: Stylet and Oral airway Placement Confirmation: ETT inserted through vocal cords under direct vision, positive ETCO2 and breath sounds checked- equal and bilateral Secured at: 21 cm Tube secured with: Tape Dental Injury: Teeth and Oropharynx as per pre-operative assessment

## 2022-06-17 NOTE — Care Management Important Message (Signed)
Important Message  Patient Details  Name: Jacqueline Bird MRN: [id number] Date of Birth: 19-Jul-1950   Medicare Important Message Given:  Yes     Scotty Pinder Montine Circle 06/17/2022, 1:38 PM

## 2022-06-17 NOTE — Discharge Instructions (Signed)
    1. Change dressings as needed 2. May shower but keep incisions covered and dry 3. Take eliquis to prevent blood clots 4. Take stool softeners as needed 5. Take pain meds as needed   

## 2022-06-17 NOTE — Anesthesia Postprocedure Evaluation (Signed)
Anesthesia Post Note  Patient: Jemiah Cuadra  Procedure(s) Performed: ORIF LEFT HIP INTERTROCHANTERIC FRACTURE (Left)     Patient location during evaluation: PACU Anesthesia Type: General Level of consciousness: awake and alert Pain management: pain level controlled Vital Signs Assessment: post-procedure vital signs reviewed and stable Respiratory status: spontaneous breathing, nonlabored ventilation and respiratory function stable Cardiovascular status: blood pressure returned to baseline and stable Postop Assessment: no apparent nausea or vomiting Anesthetic complications: no   No notable events documented.  Last Vitals:  Vitals:   06/17/22 1645 06/17/22 1719  BP: (!) 134/54 (!) 126/54  Pulse: 84 84  Resp: 17 16  Temp:  37.1 C  SpO2: 95% 92%    Last Pain:  Vitals:   06/17/22 1645  TempSrc:   PainSc: 3                  Lynda Rainwater

## 2022-06-17 NOTE — Progress Notes (Signed)
I met with the patient and her daughter and discussed her injury and reviewed treatment plan and associated risks and benefits.  All questions answered.  May resume eliquis on POD 1.

## 2022-06-17 NOTE — Progress Notes (Signed)
PROGRESS NOTE   Jacqueline Bird  NFA:[id number] DOB: 1950/12/28 DOA: 06/14/2022 PCP: Celene Squibb, MD  Brief Narrative:  72 year old white female DM TY 2 HTN HLD  squamous cell CA anus stage IIIc in remission-follows with Dr. Delton Coombes oncology LLE DVT on apixaban HFpEF admission 1/3 through 05/27/2019   s/p accidental mechanical fall 06/14/2022 Presented to [facility]-arrived at [facility] after unavoidable delay in transfer to [facility], orthopedics consulted  Hospital-Problem based course  Comminuted severe fracture of left hip -Surgery posted for later today - Wound care, weightbearing precautions, anticoagulation and outpatient follow-up as per orthopedics once performed  DM TY 2 - Tresiba 20 has been held from prior to admission as well as Trulicity 4.69 q. Sunday, would continue or resume Farxiga 10 as outpatient - Resume when eating short acting 4 units 3 times daily - On sliding scale sugars are 120s to 140s - Start D5 at 50 cc/H and when able to tolerate p.o. can discontinue  Squamous cell CA anus stage III - Outpatient follow-up with Dr. Delton Coombes  HFpEF - Lasix 20 held, resume on discharge  Left lower extremity DVT in the past - Eliquis 5 twice daily has been held, resume on follow-up after surgery  DVT prophylaxis: SCD at this time Code Status: DNR Family Communication: Discussed with the daughter at the bedside and Jeffie Pollock progress and [phone] Disposition:  Status is: Inpatient Remains inpatient appropriate because:   Require surgery    Subjective: Awake coherent no distress no pain looks comfortable n.p.o. for procedure  Objective: Vitals:   06/16/22 1445 06/16/22 1645 06/16/22 1941 06/17/22 0425  BP: (!) 148/65 (!) 154/63 (!) 136/99 (!) 151/59  Pulse: 87 88 89 92  Resp: '18 18 18 18  '$ Temp: 98.7 F (37.1 C) 98 F (36.7 C) 98.5 F (36.9 C) (!) 97.5 F (36.4 C)  TempSrc: Oral Oral Oral Oral  SpO2: 96% 98% 99% 96%  Weight:      Height:         Intake/Output Summary (Last 24 hours) at 06/17/2022 0840 Last data filed at 06/17/2022 0455 Gross per 24 hour  Intake 240 ml  Output 1500 ml  Net -1260 ml   Filed Weights   06/14/22 1719  Weight: 81.2 kg    Examination:  EOMI NCAT looks about stated age no icterus no pallor no rales no rhonchi no wheeze Absence of L hand Abdomen is soft no rebound no guarding Did not attempt to rotate hip or lower extremity Chest is clear no added sound Neuro is intact moving 4 limbs equally  Data Reviewed: personally reviewed   CBC    Component Value Date/Time   WBC 6.6 06/16/2022 0719   RBC 3.88 06/16/2022 0719   HGB 11.6 (L) 06/16/2022 0719   HCT 35.6 (L) 06/16/2022 0719   PLT 199 06/16/2022 0719   MCV 91.8 06/16/2022 0719   MCH 29.9 06/16/2022 0719   MCHC 32.6 06/16/2022 0719   RDW 14.5 06/16/2022 0719   LYMPHSABS 1.0 06/14/2022 1730   MONOABS 0.5 06/14/2022 1730   EOSABS 0.1 06/14/2022 1730   BASOSABS 0.0 06/14/2022 1730      Latest Ref Rng & Units 06/16/2022    7:19 AM 06/15/2022    4:37 AM 06/14/2022    5:30 PM  CMP  Glucose 70 - 99 mg/dL 139  133  185   BUN 8 - 23 mg/dL '12  16  18   '$ Creatinine 0.44 - 1.00 mg/dL 0.56  0.64  0.73  Sodium 135 - 145 mmol/L 134  134  137   Potassium 3.5 - 5.1 mmol/L 3.9  3.7  3.8   Chloride 98 - 111 mmol/L 102  103  101   CO2 22 - 32 mmol/L '22  26  28   '$ Calcium 8.9 - 10.3 mg/dL 8.4  8.3  8.6   Total Protein 6.5 - 8.1 g/dL  6.7  6.8   Total Bilirubin 0.3 - 1.2 mg/dL  0.5  0.4   Alkaline Phos 38 - 126 U/L  65  85   AST 15 - 41 U/L  17  18   ALT 0 - 44 U/L  15  17      Radiology Studies: No results found.   Scheduled Meds:  ALPRAZolam  0.5 mg Oral QHS   atorvastatin  80 mg Oral QPM   Chlorhexidine Gluconate Cloth  6 each Topical Daily   gabapentin  100 mg Oral QHS   gabapentin  200 mg Oral Daily   insulin aspart  0-5 Units Subcutaneous QHS   insulin aspart  0-9 Units Subcutaneous TID WC   mupirocin ointment  1 Application  Nasal BID   ondansetron  4 mg Intravenous Once   sertraline  100 mg Oral Daily   traZODone  100 mg Oral QHS   Continuous Infusions:  methocarbamol (ROBAXIN) IV       LOS: 3 days   Time spent: [place], MD [facility] To contact the attending provider between 7A-7P or the covering provider during after hours 7P-7A, please log into the web site [email or web address] and access using universal  password for that web site. If you do not have the password, please call the hospital operator.  06/17/2022, 8:40 AM

## 2022-06-17 NOTE — Op Note (Signed)
   Date of Surgery: 06/17/2022  INDICATIONS: Ms. Scarlett is a 72 y.o.-year-old female who sustained a left hip fracture. The risks and benefits of the procedure discussed with the patient prior to the procedure and all questions were answered; consent was obtained.  PREOPERATIVE DIAGNOSIS: left intertrochanteric hip fracture   POSTOPERATIVE DIAGNOSIS: Same   PROCEDURE: Open treatment of intertrochanteric fracture with intramedullary implant. CPT [phone]   SURGEON: N. Eduard Roux, M.D.   ASSIST: Madalyn Rob, [place]  ANESTHESIA: general   IV FLUIDS AND URINE: See anesthesia record   ESTIMATED BLOOD LOSS: 100 cc  IMPLANTS: [employer] InterTAN 10 x 36 cm, 85/80 lag screws  DRAINS: None.   COMPLICATIONS: see description of procedure.   DESCRIPTION OF PROCEDURE: The patient was brought to the operating room and placed supine on the operating table. The patient's leg had been signed prior to the procedure. The patient had the anesthesia placed by the anesthesiologist. The prep verification and incision time-outs were performed to confirm that this was the correct patient, site, side and location. The patient had an SCD on the opposite lower extremity. The patient did receive antibiotics prior to the incision and was re-dosed during the procedure as needed at indicated intervals. The patient was positioned on the fracture table with the table in traction and internal rotation to reduce the hip. The well leg was placed in a scissor position and all bony prominences were well-padded. The patient had the lower extremity prepped and draped in the standard surgical fashion. The incision was made 4 finger breadths superior to the greater trochanter. A guide pin was inserted into the tip of the greater trochanter under fluoroscopic guidance. An opening reamer was used to gain access to the femoral canal. The nail length was measured and inserted down the femoral canal to its proper depth. The  appropriate version of insertion for the lag screw was found under fluoroscopy. A pin was inserted up the femoral neck through the jig. Then, a second antirotation pin was inserted inferior to the first pin. The length of the lag screw was then measured. The lag screw was inserted as near to center-center in the head as possible. The antirotation pin was then taken out and an interdigitating compression screw was placed in its place. The leg was taken out of traction, then the interdigitating compression screw was used to compress across the fracture. Compression was visualized on serial xrays.  A distal interlocking screw was placed using the perfect circle technique.  The wound was copiously irrigated with saline and the subcutaneous layer closed with 2.0 vicryl and the skin was reapproximated with staples. The wounds were cleaned and dried a final time and a sterile dressing was placed. The hip was taken through a range of motion at the end of the case under fluoroscopic imaging to visualize the approach-withdraw phenomenon and confirm implant length in the head. The patient was then awakened from anesthesia and taken to the recovery room in stable condition. All counts were correct at the end of the case.   Tawanna Cooler was necessary for opening, closing, retracting, limb positioning and overall facilitation and completion of the surgery.  POSTOPERATIVE PLAN: The patient will be weight bearing as tolerated and will return in 2 weeks for staple removal and the patient will receive DVT prophylaxis based on other medications, activity level, and risk ratio of bleeding to thrombosis.   Azucena Cecil, MD [facility] 3:11 PM

## 2022-06-18 ENCOUNTER — Other Ambulatory Visit: Payer: Self-pay

## 2022-06-18 DIAGNOSIS — S72142A Displaced intertrochanteric fracture of left femur, initial encounter for closed fracture: Secondary | ICD-10-CM | POA: Diagnosis not present

## 2022-06-18 LAB — RENAL FUNCTION PANEL
Albumin: 2.5 g/dL — ABNORMAL LOW (ref 3.5–5.0)
Anion gap: 10 (ref 5–15)
BUN: 23 mg/dL (ref 8–23)
CO2: 25 mmol/L (ref 22–32)
Calcium: 8.5 mg/dL — ABNORMAL LOW (ref 8.9–10.3)
Chloride: 99 mmol/L (ref 98–111)
Creatinine, Ser: 0.73 mg/dL (ref 0.44–1.00)
GFR, Estimated: >60 mL/min (ref >60)
Glucose, Bld: 219 mg/dL — ABNORMAL HIGH (ref 70–99)
Phosphorus: 3.9 mg/dL (ref 2.5–4.6)
Potassium: 4 mmol/L (ref 3.5–5.1)
Sodium: 134 mmol/L — ABNORMAL LOW (ref 135–145)

## 2022-06-18 LAB — GLUCOSE, CAPILLARY
Glucose-Capillary: 229 mg/dL — ABNORMAL HIGH (ref 70–99)
Glucose-Capillary: 213 mg/dL — ABNORMAL HIGH (ref 70–99)
Glucose-Capillary: 219 mg/dL — ABNORMAL HIGH (ref 70–99)
Glucose-Capillary: 270 mg/dL — ABNORMAL HIGH (ref 70–99)
Glucose-Capillary: 193 mg/dL — ABNORMAL HIGH (ref 70–99)
Glucose-Capillary: 187 mg/dL — ABNORMAL HIGH (ref 70–99)

## 2022-06-18 LAB — CBC
HCT: 34.7 % — ABNORMAL LOW (ref 36.0–46.0)
Hemoglobin: 11.6 g/dL — ABNORMAL LOW (ref 12.0–15.0)
MCH: 29.4 pg (ref 26.0–34.0)
MCHC: 33.4 g/dL (ref 30.0–36.0)
MCV: 88.1 fL (ref 80.0–100.0)
Platelets: 216 10*3/uL (ref 150–400)
RBC: 3.94 MIL/uL (ref 3.87–5.11)
RDW: 13.6 % (ref 11.5–15.5)
WBC: 5.9 10*3/uL (ref 4.0–10.5)
nRBC: 0 % (ref 0.0–0.2)

## 2022-06-18 MED ORDER — FUROSEMIDE 20 MG PO TABS
20.0000 mg | ORAL_TABLET | ORAL | Status: DC
  Administered 2022-06-18: 20 mg via ORAL
  Filled 2022-06-18: qty 1

## 2022-06-18 MED ORDER — DULAGLUTIDE 0.75 MG/0.5ML ~~LOC~~ SOAJ
0.7500 mg | SUBCUTANEOUS | Status: DC
  Administered 2022-06-21: 0.75 mg via INTRAMUSCULAR

## 2022-06-18 MED ORDER — FLEET ENEMA 7-19 GM/118ML RE ENEM
1.0000 | ENEMA | Freq: Once | RECTAL | Status: DC
  Filled 2022-06-18 (×2): qty 1

## 2022-06-18 MED ORDER — SENNOSIDES-DOCUSATE SODIUM 8.6-50 MG PO TABS
1.0000 | ORAL_TABLET | Freq: Once | ORAL | Status: AC
  Administered 2022-06-18: 1 via ORAL
  Filled 2022-06-18: qty 1

## 2022-06-18 MED ORDER — OXYCODONE HCL 5 MG PO TABS: 5.0000 mg | ORAL_TABLET | Freq: Four times a day (QID) | ORAL | 0 refills | Status: DC | PRN

## 2022-06-18 MED ORDER — DAPAGLIFLOZIN PROPANEDIOL 10 MG PO TABS
10.0000 mg | ORAL_TABLET | Freq: Every day | ORAL | Status: DC
  Administered 2022-06-18 – 2022-06-23 (×6): 10 mg via ORAL
  Filled 2022-06-18 (×6): qty 1

## 2022-06-18 MED ORDER — APIXABAN 5 MG PO TABS
5.0000 mg | ORAL_TABLET | Freq: Two times a day (BID) | ORAL | Status: DC
  Administered 2022-06-18 – 2022-06-23 (×11): 5 mg via ORAL
  Filled 2022-06-18 (×11): qty 1

## 2022-06-18 MED ORDER — FLEET ENEMA 7-19 GM/118ML RE ENEM: 1.0000 | ENEMA | Freq: Once | RECTAL | Status: DC

## 2022-06-18 MED ORDER — INSULIN DETEMIR 100 UNIT/ML ~~LOC~~ SOLN
10.0000 [IU] | Freq: Every day | SUBCUTANEOUS | Status: DC
  Administered 2022-06-18 – 2022-06-19 (×2): 10 [IU] via SUBCUTANEOUS
  Filled 2022-06-18 (×2): qty 0.1

## 2022-06-18 NOTE — Evaluation (Signed)
Physical Therapy Evaluation Patient Details Name: Jacqueline Bird MRN: [id number] DOB: Jul 11, 1950 Today's Date: 06/18/2022  History of Present Illness  72 yo female admitted 1/28 after fall at home with left femur fx. S/p Lt femur ORIF 1/31. 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 Pt lives alone and children work and will need SNF for D/C with pt and daughter agreeable. Pt reports not being very active and enjoys reading. Pt reports at least 5 falls in the last year. Pt with decreased strength, cognition, transfers, ROM and function who will benefit from acute therapy to maximize mobility and safety.        Recommendations for follow up therapy are one component of a multi-disciplinary discharge planning process, led by the attending physician.  Recommendations may be updated based on patient status, additional functional criteria and insurance authorization.  Follow Up Recommendations Skilled nursing-short term rehab (<3 hours/day) Can patient physically be transported by private vehicle: No    Assistance Recommended at Discharge Frequent or constant Supervision/Assistance  Patient can return home with the following  Two people to help with walking and/or transfers;A lot of help with bathing/dressing/bathroom;Assistance with cooking/housework;Assist for transportation;Help with stairs or ramp for entrance    Equipment Recommendations Wheelchair (measurements PT);BSC/3in1  Recommendations for Other Services       Functional Status Assessment Patient has had a recent decline in their functional status and/or demonstrates limited ability to make significant improvements in function in a reasonable and predictable amount of time     Precautions / Restrictions Precautions Precautions: Fall;Other (comment) Precaution Comments:  L hand amputation Restrictions Weight Bearing Restrictions: Yes LLE Weight Bearing: Weight bearing as tolerated      Mobility  Bed Mobility Overal bed mobility: Needs Assistance Bed Mobility: Supine to Sit, Sit to Supine     Supine to sit: Max assist, +2 for physical assistance, +2 for safety/equipment, HOB elevated Sit to supine: Total assist, +2 for physical assistance, +2 for safety/equipment   General bed mobility comments: required 2-3 attempts due to pt pain levels. able to move RLE with light assist and heavy assist with slow movements for LLE and lifting trunk, max cues for bedrail use though pt still reaching out to therapist at times. Total A x 2 to return to supine as pt slow moving, resistant to movements at times    Transfers Overall transfer level: Needs assistance Equipment used: 2 person hand held assist Transfers: Sit to/from Stand Sit to Stand: Max assist, +2 physical assistance, +2 safety/equipment           General transfer comment: 2 standing trials with R hand holding behind therapist elbow and L elbow locked around second therapist. noted instability, poor posture and difficulty tucking in bottom. B knees shaking in standing, Left knee blocked with partial buckling. unable to take steps. Total A x 2 to scoot up towards [facility]    Ambulation/Gait               General Gait Details: unable  Stairs            Wheelchair Mobility    Modified Rankin (Stroke Patients Only)       Balance Overall balance assessment: Needs assistance Sitting-balance support: Bilateral upper extremity supported Sitting balance-Leahy Scale: Poor Sitting balance - Comments: reliant on bil UE support, posterior lean Postural  control: Posterior lean Standing balance support: Bilateral upper extremity supported Standing balance-Leahy Scale: Poor Standing balance comment: bil UE hooked in standing and reliant on +2 assist                              Pertinent Vitals/Pain Pain Assessment Pain Score: 10-Worst pain ever Pain Location: L LE Pain Descriptors / Indicators: Guarding, Grimacing, Moaning Pain Intervention(s): Limited activity within patient's tolerance, Repositioned, Monitored during session, RN gave pain meds during session    [place] expects to be discharged to:: Private residence Living Arrangements: Alone Available Help at Discharge: Family;Available PRN/intermittently Type of Home: House Home Access: Level entry       Home Layout: One level Home Equipment: None      Prior Function Prior Level of Function : Independent/Modified Independent;Driving               ADLs Comments: Indep with ADLs, has occasional assist with household IADLs from hired personnel. 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, unclear when this operation occurred (present on this admission)    Lower Extremity Assessment Lower Extremity Assessment: Generalized weakness;RLE deficits/detail;LLE deficits/detail RLE Deficits / Details: pt with difficulty moving RLE in bed even with cues and physical assist LLE Deficits / Details: max assist to move LLE in bed, buckling in standing, limited due to pain LLE: Unable to fully assess due to pain    Cervical / Trunk Assessment Cervical / Trunk Assessment: Normal;Other exceptions Cervical / Trunk Exceptions: posterior lean in sitting with increased time to achieve midline  Communication   Communication: HOH  Cognition Arousal/Alertness: Awake/alert Behavior During Therapy: Anxious, Flat affect Overall Cognitive Status: Difficult to assess Area of Impairment: Following commands, Safety/judgement, Awareness, Problem  solving, Orientation                 Orientation Level: Disoriented to, Time     Following Commands: Follows one step commands inconsistently, Follows one step commands with increased time Safety/Judgement: Decreased awareness of safety, Decreased awareness of deficits Awareness: Intellectual Problem Solving: Slow processing, Decreased initiation, Difficulty sequencing, Requires verbal cues, Requires tactile cues General Comments: Per daughter, cognition not too far from normal. Pt anxious, brief outbursts of agitation with inconsistent vocalization of needs and poor awareness of deficits as pt with difficulty sitting EOB but reported plan to stand and take steps.        General Comments General comments (skin integrity, edema, etc.): Daughter present. Pt at times with agitated remarks to daughter during session    Exercises General Exercises - Lower Extremity Heel Slides: AAROM, Left, Supine, 5 reps Hip ABduction/ADduction: AAROM, Left, Supine, 5 reps   Assessment/Plan    PT Assessment Patient needs continued PT services  PT Problem List Decreased strength;Decreased mobility;Decreased safety awareness;Decreased range of motion;Decreased activity tolerance;Decreased cognition;Decreased balance;Decreased knowledge of use of DME;Pain       PT Treatment Interventions DME instruction;Therapeutic activities;Cognitive remediation;Gait training;Therapeutic exercise;Patient/family education;Balance training;Functional mobility training    PT Goals (Current goals can be found in the Care Plan section)  Acute Rehab PT Goals Patient Stated Goal: be able to walk and return home PT Goal Formulation: With patient/family Time For Goal Achievement: 07/02/22  Potential to Achieve Goals: Fair    Frequency Min 4X/week     Co-evaluation   Reason for Co-Treatment: Necessary to address cognition/behavior during functional activity;For patient/therapist safety;To address functional/ADL  transfers   OT goals addressed during session: ADL's and self-care;Strengthening/ROM       AM-PAC PT "6 Clicks" Mobility  Outcome Measure Help needed turning from your back to your side while in a flat bed without using bedrails?: A Lot Help needed moving from lying on your back to sitting on the side of a flat bed without using bedrails?: Total Help needed moving to and from a bed to a chair (including a wheelchair)?: Total Help needed standing up from a chair using your arms (e.g., wheelchair or bedside chair)?: Total Help needed to walk in hospital room?: Total Help needed climbing 3-5 steps with a railing? : Total 6 Click Score: 7    End of Session Equipment Utilized During Treatment: Gait belt Activity Tolerance: Patient tolerated treatment well Patient left: with call bell/phone within reach;with family/visitor present;with bed alarm set Nurse Communication: Mobility status PT Visit Diagnosis: Other abnormalities of gait and mobility (R26.89);History of falling (Z91.81);Muscle weakness (generalized) (M62.81)    Time: 9407-6808 PT Time Calculation (min) (ACUTE ONLY): 43 min   Charges:   PT Evaluation $PT Eval Moderate Complexity: 1 Mod PT Treatments $Therapeutic Activity: 8-22 mins        Bayard Males, PT Acute Rehabilitation Services Office: [phone]   Sandy Salaam Vyom Brass 06/18/2022, 9:25 AM

## 2022-06-18 NOTE — TOC Initial Note (Addendum)
Transition of Care [facility]) - Initial/Assessment Note    Patient Details  Name: Jacqueline Bird MRN: [id number] Date of Birth: [birth year]  Transition of Care [facility]) CM/SW Contact:    Jinger Neighbors, LCSW Phone Number: 06/18/2022, 2:48 PM  Clinical Narrative:                 CSW completed assessment at bedside with pt. Pt aaox4 and pleasant throughout engagement. CSW explained PT recommendations for SNF and pt is in agreement. Pt currently resides at home, alone. CSW will continue work up and fax out.  CSW completed work up and fax out   Expected Discharge Plan: [place] Barriers to Discharge: Continued Medical Work up   Patient Goals and CMS Choice   CMS Medicare.gov Compare Post Acute Care list provided to:: Patient Choice offered to / list presented to : Patient      Expected Discharge Plan and Services                                             Prior Living Arrangements/Services   Lives with:: Self Patient language and need for interpreter reviewed:: Yes Do you feel safe going back to the place where you live?: Yes      Need for Family Participation in Patient Care: Yes (Comment) Care giver support system in place?: Yes (comment)   Criminal Activity/Legal Involvement Pertinent to Current Situation/Hospitalization: No - Comment as needed  Activities of Daily Living Home Assistive Devices/Equipment: None ADL Screening (condition at time of admission) Patient's cognitive ability adequate to safely complete daily activities?: Yes Is the patient deaf or have difficulty hearing?: Yes Does the patient have difficulty seeing, even when wearing glasses/contacts?: Yes Does the patient have difficulty concentrating, remembering, or making decisions?: No Patient able to express need for assistance with ADLs?: Yes Does the patient have difficulty dressing or bathing?: Yes Independently performs ADLs?: No Communication: Independent Dressing (OT): Needs  assistance Is this a change from baseline?: Change from baseline, expected to last <3days Grooming: Independent, Needs assistance Is this a change from baseline?: Change from baseline, expected to last <3 days Feeding: Independent Bathing: Needs assistance Is this a change from baseline?: Change from baseline, expected to last <3 days Toileting: Needs assistance Is this a change from baseline?: Change from baseline, expected to last <3 days In/Out Bed: Needs assistance Is this a change from baseline?: Change from baseline, expected to last <3 days Walks in Home: Needs assistance Is this a change from baseline?: Change from baseline, expected to last <3 days Does the patient have difficulty walking or climbing stairs?: Yes Weakness of Legs: Left Weakness of Arms/Hands: Left  Permission Sought/Granted                  Emotional Assessment Appearance:: Appears stated age Attitude/Demeanor/Rapport: Engaged Affect (typically observed): Overwhelmed Orientation: : Oriented to Self, Oriented to Place, Oriented to  Time, Oriented to Situation Alcohol / Substance Use: Other (comment) Psych Involvement: No (comment)  Admission diagnosis:  Closed fracture of left hip, initial encounter [facility]) [S72.002A] Patient Active Problem List   Diagnosis Date Noted   Closed fracture of left hip ([place]) 06/15/2022   Closed comminuted intertrochanteric fracture of left femur ([place]) 06/14/2022   Hypoalbuminemia due to protein-calorie malnutrition ([place]) 06/14/2022   Type 2 diabetes mellitus with hyperglycemia ([place]) 06/14/2022   Mixed hyperlipidemia 06/14/2022  Elevated lipase 06/14/2022   Obesity (BMI 30-39.9) 06/14/2022   DVT (deep venous thrombosis) ([place]) 06/14/2022   Cirrhosis of liver without ascites ([place]) 06/24/2021   Pleural effusion, right    Acute diastolic CHF (congestive heart failure) ([place]) 05/23/2019   Acute respiratory failure with hypoxia ([place]) 05/23/2019   Hypokalemia 05/23/2019    Essential hypertension 05/23/2019   Type 2 diabetes mellitus with other specified complication ([place]) 63/87/5643   Anxiety 05/23/2019   CHF (congestive heart failure) ([place]) 05/21/2019   Anal cancer ([place]) 02/01/2019   Mass of anus 01/24/2019   Presbycusis of both ears 03/18/2017   Sudden hearing loss, left 03/18/2017   PCP:  Celene Squibb, MD Pharmacy:   [facility] [address] [place] - [place] [address] [place] [address] Phone: [phone] Fax: [phone]  [facility] - [place], [place] - [address] [place] [address] Phone: [phone] Fax: [phone]     Social Determinants of Health (SDOH) Social History: [place]: No Food Insecurity (06/16/2022)  Housing: Low Risk  (06/16/2022)  Transportation Needs: No Transportation Needs (06/16/2022)  Utilities: Not At Risk (06/16/2022)  Depression (PHQ2-9): High Risk (02/05/2022)  Financial Resource Strain: Medium Risk (02/06/2019)  Physical Activity: Sufficiently Active (02/06/2019)  Social Connections: Moderately Integrated (02/06/2019)  Stress: Stress Concern Present (02/06/2019)  Tobacco Use: Low Risk  (06/17/2022)   SDOH Interventions: Housing Interventions: Patient Refused   Readmission Risk Interventions     No data to display

## 2022-06-18 NOTE — NC FL2 (Signed)
[place] MEDICAID FL2 LEVEL OF CARE FORM     IDENTIFICATION  Patient Name: Jacqueline Bird Birthdate: Mar 01, 1951 Sex: female Admission Date (Current Location): 06/14/2022  [facility] and [place] Number:  Herbalist and Address:  [facility] [place] [place] [address] [place], [place] [address]      Provider Number: [id number]  Attending Physician Name and Address:  Nita Sells, MD  Relative Name and Phone Number:       Current Level of Care: Hospital Recommended Level of Care: [place] Prior Approval Number:    Date Approved/Denied:   PASRR Number: [id number]  Discharge Plan: SNF    Current Diagnoses: Patient Active Problem List   Diagnosis Date Noted   Closed fracture of left hip ([place]) 06/15/2022   Closed comminuted intertrochanteric fracture of left femur ([place]) 06/14/2022   Hypoalbuminemia due to protein-calorie malnutrition ([place]) 06/14/2022   Type 2 diabetes mellitus with hyperglycemia ([place]) 06/14/2022   Mixed hyperlipidemia 06/14/2022   Elevated lipase 06/14/2022   Obesity (BMI 30-39.9) 06/14/2022   DVT (deep venous thrombosis) ([place]) 06/14/2022   Cirrhosis of liver without ascites ([place]) 06/24/2021   Pleural effusion, right    Acute diastolic CHF (congestive heart failure) ([place]) 05/23/2019   Acute respiratory failure with hypoxia ([place]) 05/23/2019   Hypokalemia 05/23/2019   Essential hypertension 05/23/2019   Type 2 diabetes mellitus with other specified complication ([place]) 61/44/3154   Anxiety 05/23/2019   CHF (congestive heart failure) ([place]) 05/21/2019   Anal cancer ([place]) 02/01/2019   Mass of anus 01/24/2019   Presbycusis of both ears 03/18/2017   Sudden hearing loss, left 03/18/2017    Orientation RESPIRATION BLADDER Height & Weight     Self, Time, Place, Situation  Normal Continent, External catheter Weight: 179 lb (81.2 kg) Height:  '5\' 4"'$  (162.6 cm)  BEHAVIORAL SYMPTOMS/MOOD NEUROLOGICAL BOWEL NUTRITION  STATUS      Continent Diet  AMBULATORY STATUS COMMUNICATION OF NEEDS Skin   Extensive Assist Verbally Normal                       Personal Care Assistance Level of Assistance  Bathing, Feeding, Dressing Bathing Assistance: Maximum assistance Feeding assistance: Independent Dressing Assistance: Maximum assistance     Functional Limitations Info  Sight, Hearing, Speech Sight Info: Adequate Hearing Info: Adequate Speech Info: Adequate    SPECIAL CARE FACTORS FREQUENCY  PT (By licensed PT), OT (By licensed OT)     PT Frequency: 5x/wk OT Frequency: 5x/wk            Contractures Contractures Info: Not present    Additional Factors Info  Code Status Code Status Info: DNR             Current Medications (06/18/2022):  This is the current hospital active medication list Current Facility-Administered Medications  Medication Dose Route Frequency Provider Last Rate Last Admin   acetaminophen (TYLENOL) tablet 325-650 mg  325-650 mg Oral Q6H PRN Leandrew Koyanagi, MD       ALPRAZolam Duanne Moron) tablet 0.5 mg  0.5 mg Oral QHS Leandrew Koyanagi, MD   0.5 mg at 06/17/22 2053   apixaban (ELIQUIS) tablet 5 mg  5 mg Oral BID Nita Sells, MD   5 mg at 06/18/22 1059   atorvastatin (LIPITOR) tablet 80 mg  80 mg Oral QPM Leandrew Koyanagi, MD   80 mg at 06/17/22 1804   Chlorhexidine Gluconate Cloth 2 % PADS 6 each  6 each  Topical Daily Leandrew Koyanagi, MD   6 each at 06/18/22 1102   dapagliflozin propanediol (FARXIGA) tablet 10 mg  10 mg Oral Daily Nita Sells, MD   10 mg at 06/18/22 1059   [START ON 06/21/2022] Dulaglutide SOPN 0.75 mg  0.75 mg Intramuscular Q Rosalia Hammers, Darylene Price, MD       furosemide (LASIX) tablet 20 mg  20 mg Oral Santiago Bur, MD   20 mg at 06/18/22 1059   gabapentin (NEURONTIN) capsule 100 mg  100 mg Oral QHS Leandrew Koyanagi, MD   100 mg at 06/17/22 2052   gabapentin (NEURONTIN) capsule 200 mg  200 mg Oral Daily Leandrew Koyanagi, MD   200 mg at  06/18/22 0844   hydrALAZINE (APRESOLINE) injection 10 mg  10 mg Intravenous Q6H PRN Leandrew Koyanagi, MD       HYDROmorphone (DILAUDID) injection 0.5-1 mg  0.5-1 mg Intravenous Q4H PRN Leandrew Koyanagi, MD   1 mg at 06/18/22 0750   insulin aspart (novoLOG) injection 0-5 Units  0-5 Units Subcutaneous QHS Leandrew Koyanagi, MD   2 Units at 06/17/22 2248   insulin aspart (novoLOG) injection 0-9 Units  0-9 Units Subcutaneous TID WC Leandrew Koyanagi, MD   3 Units at 06/18/22 1305   insulin detemir (LEVEMIR) injection 10 Units  10 Units Subcutaneous Daily Nita Sells, MD   10 Units at 06/18/22 1100   magnesium citrate solution 1 Bottle  1 Bottle Oral Once PRN Leandrew Koyanagi, MD       menthol-cetylpyridinium (CEPACOL) lozenge 3 mg  1 lozenge Oral PRN Leandrew Koyanagi, MD       Or   phenol (CHLORASEPTIC) mouth spray 1 spray  1 spray Mouth/Throat PRN Leandrew Koyanagi, MD       methocarbamol (ROBAXIN) tablet 500 mg  500 mg Oral Q6H PRN Leandrew Koyanagi, MD       Or   methocarbamol (ROBAXIN) 500 mg in dextrose 5 % 50 mL IVPB  500 mg Intravenous Q6H PRN Leandrew Koyanagi, MD       mupirocin ointment (BACTROBAN) 2 % 1 Application  1 Application Nasal BID Leandrew Koyanagi, MD   1 Application at 59/93/57 0846   ondansetron (ZOFRAN) injection 4 mg  4 mg Intravenous Once Leandrew Koyanagi, MD       ondansetron [facility]) tablet 4 mg  4 mg Oral Q6H PRN Leandrew Koyanagi, MD       Or   ondansetron [facility]) injection 4 mg  4 mg Intravenous Q6H PRN Leandrew Koyanagi, MD   4 mg at 06/16/22 1242   oxyCODONE (Oxy IR/ROXICODONE) immediate release tablet 10-15 mg  10-15 mg Oral Q4H PRN Leandrew Koyanagi, MD       oxyCODONE (Oxy IR/ROXICODONE) immediate release tablet 5-10 mg  5-10 mg Oral Q4H PRN Leandrew Koyanagi, MD       polyethylene glycol (MIRALAX / GLYCOLAX) packet 17 g  17 g Oral Daily PRN Leandrew Koyanagi, MD       Derrill Memo ON 06/19/2022] senna-docusate (Senokot-S) tablet 1 tablet  1 tablet Oral Once Nita Sells, MD       sertraline (ZOLOFT) tablet 100  mg  100 mg Oral Daily Leandrew Koyanagi, MD   100 mg at 06/18/22 0845   [START ON 06/19/2022] sodium phosphate (FLEET) 7-19 GM/118ML enema 1 enema  1 enema Rectal Once Nita Sells, MD       sorbitol 70 %  solution 30 mL  30 mL Oral Daily PRN Leandrew Koyanagi, MD       tranexamic acid (CYKLOKAPRON) IVPB 1,000 mg  1,000 mg Intravenous Once Leandrew Koyanagi, MD       traZODone (DESYREL) tablet 100 mg  100 mg Oral QHS Leandrew Koyanagi, MD   100 mg at 06/17/22 2249     Discharge Medications: Please see discharge summary for a list of discharge medications.  Relevant Imaging Results:  Relevant Lab Results:   Additional Information SS#: [id number]  Jinger Neighbors, LCSW

## 2022-06-18 NOTE — Inpatient Diabetes Management (Signed)
Inpatient Diabetes Program Recommendations  AACE/ADA: New Consensus Statement on Inpatient Glycemic Control (2015)  Target Ranges:  Prepandial:   less than 140 mg/dL      Peak postprandial:   less than 180 mg/dL (1-2 hours)      Critically ill patients:  140 - 180 mg/dL   Lab Results  Component Value Date   GLUCAP 213 (H) 06/18/2022   HGBA1C 6.7 (H) 06/16/2022    Review of Glycemic Control  Latest Reference Range & Units 06/18/22 03:23 06/18/22 07:45  Glucose-Capillary 70 - 99 mg/dL 229 (H) 213 (H)  (H): Data is abnormally high Diabetes history: Type 2 Dm Outpatient Diabetes medications: Trulicity .75 mg qwk, Farxiga 10 mg QD, Tresiba 28 units QD, Novolog 4 units TID Current orders for Inpatient glycemic control: Novolog 0-9 units TID & HS Decadron 5 mg x 1  Inpatient Diabetes Program Recommendations:    Consider adding Semglee 10 units QD.   Thanks, Bronson Curb, MSN, RNC-OB Diabetes Coordinator [phone] (8a-5p)

## 2022-06-18 NOTE — Evaluation (Signed)
Occupational Therapy Evaluation Patient Details Name: Jacqueline Bird MRN: [id number] DOB: 12-20-1950 Today's Date: 06/18/2022   History of Present Illness 72 yo female admitted 1/28 after fall at home with left femur fx. S/p Lt femur ORIF 1/31. 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 Pt presents now with deficits in cognition, LLE pain, strength and balance. Pt anxious and resistant to movement at times. Overall, pt requires Max-Total A x 2 for bed mobility, standing attempts and lateral scooting along HOB. Unable to progress transfers to chair. Pt requires Mod A for UB ADL and Total A x 2 for LB ADLs bed level. Rec SNF rehab at DC.       Recommendations for follow up therapy are one component of a multi-disciplinary discharge planning process, led by the attending physician.  Recommendations may be updated based on patient status, additional functional criteria and insurance authorization.   Follow Up Recommendations  Skilled nursing-short term rehab (<3 hours/day)     Assistance Recommended at Discharge Frequent or constant Supervision/Assistance  Patient can return home with the following Two people to help with walking and/or transfers;Two people to help with bathing/dressing/bathroom    Functional Status Assessment  Patient has had a recent decline in their functional status and demonstrates the ability to make significant improvements in function in a reasonable and predictable amount of time.  Equipment Recommendations  Other (comment) (TBD pending progress)    Recommendations for Other Services       Precautions / Restrictions Precautions Precautions: Fall;Other (comment) Precaution Comments: L hand amputation (unclear when this operation was performed) Restrictions Weight Bearing Restrictions: Yes LLE Weight Bearing: Weight bearing as tolerated       Mobility Bed Mobility Overal bed mobility: Needs Assistance Bed Mobility: Supine to Sit, Sit to Supine     Supine to sit: Max assist, +2 for physical assistance, +2 for safety/equipment, HOB elevated Sit to supine: Total assist, +2 for physical assistance, +2 for safety/equipment   General bed mobility comments: required 2-3 attempts due to pt pain levels. able to move RLE with light assist and heavy assist with slow movements for LLE and lifting trunk, max cues for bedrail use though pt still reaching out to therapist at times. Total A x 2 to return to supine as pt slow moving, resistant to movements at times    Transfers Overall transfer level: Needs assistance Equipment used: 2 person hand held assist Transfers: Sit to/from Stand Sit to Stand: Max assist, +2 physical assistance, +2 safety/equipment           General transfer comment: 2 standing trials with R hand holding behind therapist elbow and L elbow locked around second therapist. noted instability, poor posture and difficulty tucking in bottom. B knees shaking in standing, unable to take steps. Total A x 2 to scoot up towards [facility]      Balance Overall balance assessment: Needs assistance Sitting-balance support: No upper extremity supported, Feet supported Sitting balance-Leahy Scale: Poor Sitting balance - Comments: reliant on RUE support Postural control: Right lateral lean, Posterior lean Standing balance support: Bilateral upper extremity supported Standing balance-Leahy Scale: Poor                             ADL either performed or assessed with clinical judgement   ADL Overall ADL's : Needs  assistance/impaired Eating/Feeding: Set up   Grooming: Minimal assistance;Sitting   Upper Body Bathing: Moderate assistance;Bed level;Sitting   Lower Body Bathing: Total assistance;+2 for safety/equipment;+2 for physical assistance;Bed level   Upper Body Dressing : Moderate assistance;Sitting;Bed  level   Lower Body Dressing: Total assistance;+2 for physical assistance;+2 for safety/equipment;Bed level       Toileting- Clothing Manipulation and Hygiene: Total assistance;+2 for physical assistance;+2 for safety/equipment;Bed level         General ADL Comments: Pt limited by anxiety/behaviors and pain, requires extensive assist for bed mobility and brief standing attempts w/ poor overall insight into deficits     Vision Baseline Vision/History: 1 Wears glasses Ability to See in Adequate Light: 0 Adequate Patient Visual Report: No change from baseline Vision Assessment?: No apparent visual deficits     Perception     Praxis      Pertinent Vitals/Pain Pain Assessment Pain Assessment: 0-10 Pain Score: 10-Worst pain ever Pain Location: L LE Pain Descriptors / Indicators: Guarding, Grimacing, Moaning Pain Intervention(s): Monitored during session, RN gave pain meds during session     Hand Dominance Right   Extremity/Trunk Assessment Upper Extremity Assessment Upper Extremity Assessment: LUE deficits/detail LUE Deficits / Details: 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, unclear when this operation occurred (present on this admission)   Lower Extremity Assessment Lower Extremity Assessment: Defer to PT evaluation   Cervical / Trunk Assessment Cervical / Trunk Assessment: Normal   Communication Communication Communication: HOH   Cognition Arousal/Alertness: Awake/alert Behavior During Therapy: Impulsive, Anxious Overall Cognitive Status: Difficult to assess Area of Impairment: Following commands, Safety/judgement, Awareness, Problem solving                       Following Commands: Follows one step commands inconsistently, Follows one step commands with increased time Safety/Judgement: Decreased awareness of safety, Decreased awareness of deficits Awareness: Intellectual Problem Solving: Slow processing, Decreased initiation, Difficulty  sequencing, Requires verbal cues, Requires tactile cues General Comments: Per daughter, cognition not too far from normal. Pt anxious, brief outbursts of agitation with inconsistent vocalization of needs and poor awareness of deficits as pt with difficulty sitting EOB but reported plan to stand and take steps.     General Comments  Daughter present. Pt at times with agitated remarks to daughter during session    Exercises     [place] expects to be discharged to:: Private residence Living Arrangements: Alone Available Help at Discharge: Family;Available PRN/intermittently Type of Home: House Home Access: Level entry     Home Layout: One level     Bathroom Shower/Tub: Walk-in shower;Tub/shower unit   Bathroom Toilet: Standard     Home Equipment: None          Prior Functioning/Environment Prior Level of Function : Independent/Modified Independent;Driving               ADLs Comments: Indep with ADLs, has occasional assist with household IADLs from hired personnel. Son will assist with heavier yard work tasks, etc as needed        OT Problem List: Decreased strength;Decreased activity tolerance;Impaired balance (sitting and/or standing);Decreased cognition;Decreased safety awareness;Decreased knowledge of use of DME or AE;Pain      OT Treatment/Interventions: Self-care/ADL training;Therapeutic exercise;Energy conservation;DME and/or AE instruction;Therapeutic activities    OT Goals(Current goals can be found in the care plan section) Acute Rehab OT Goals Patient Stated Goal: pain control OT Goal Formulation: With patient/family Time For  Goal Achievement: 07/02/22 Potential to Achieve Goals: Good  OT Frequency: Min 2X/week    Co-evaluation PT/OT/SLP Co-Evaluation/Treatment: Yes Reason for Co-Treatment: Necessary to address cognition/behavior during functional activity;For patient/therapist safety;To address  functional/ADL transfers   OT goals addressed during session: ADL's and self-care;Strengthening/ROM      AM-PAC OT "6 Clicks" Daily Activity     Outcome Measure Help from another person eating meals?: A Little Help from another person taking care of personal grooming?: A Little Help from another person toileting, which includes using toliet, bedpan, or urinal?: Total Help from another person bathing (including washing, rinsing, drying)?: A Lot Help from another person to put on and taking off regular upper body clothing?: A Lot Help from another person to put on and taking off regular lower body clothing?: Total 6 Click Score: 12   End of Session Nurse Communication: Patient requests pain meds  Activity Tolerance: Patient limited by pain;Other (comment) (limited by cognition) Patient left: in bed;with call bell/phone within reach;with bed alarm set;with family/visitor present  OT Visit Diagnosis: Unsteadiness on feet (R26.81);Other abnormalities of gait and mobility (R26.89);Muscle weakness (generalized) (M62.81)                Time: 7414-2395 OT Time Calculation (min): 31 min Charges:  OT General Charges $OT Visit: 1 Visit OT Evaluation $OT Eval Moderate Complexity: 1 Mod  Malachy Chamber, OTR/L Acute Rehab Services Office: [phone]   Layla Maw 06/18/2022, 8:33 AM

## 2022-06-18 NOTE — Progress Notes (Signed)
Subjective: 1 Day Post-Op Procedure(s) (LRB): ORIF LEFT HIP INTERTROCHANTERIC FRACTURE (Left) Patient reports pain as mild.    Objective: Vital signs in last 24 hours: Temp:  [97.5 F (36.4 C)-99.2 F (37.3 C)] 97.5 F (36.4 C) (02/01 0330) Pulse Rate:  [81-91] 81 (02/01 0743) Resp:  [13-19] 18 (02/01 0330) BP: (120-151)/(45-72) 151/68 (02/01 0743) SpO2:  [92 %-100 %] 100 % (02/01 0743)  Intake/Output from previous day: 01/31 0701 - 02/01 0700 In: 2913.7 [P.O.:980; I.V.:633.7] Out: 180 [Urine:150; Blood:30] Intake/Output this shift: No intake/output data recorded.  Recent Labs    06/16/22 0719 06/18/22 0437  HGB 11.6* 11.6*   Recent Labs    06/16/22 0719 06/18/22 0437  WBC 6.6 5.9  RBC 3.88 3.94  HCT 35.6* 34.7*  PLT 199 216   Recent Labs    06/16/22 0719 06/18/22 0437  NA 134* 134*  K 3.9 4.0  CL 102 99  CO2 22 25  BUN 12 23  CREATININE 0.56 0.73  GLUCOSE 139* 219*  CALCIUM 8.4* 8.5*   No results for input(s): "LABPT", "INR" in the last 72 hours.  Neurovascular intact Sensation intact distally Intact pulses distally Dorsiflexion/Plantar flexion intact Incision: dressing C/D/I No cellulitis present Compartment soft   Assessment/Plan: 1 Day Post-Op Procedure(s) (LRB): ORIF LEFT HIP INTERTROCHANTERIC FRACTURE (Left) Up with therapy WBAT LLE ABLA- mild and stable Ok to resume Eliquis F/u with Dr. Erlinda Hong two weeks post-op      Aundra Dubin 06/18/2022, 10:37 AM

## 2022-06-18 NOTE — Progress Notes (Addendum)
PROGRESS NOTE   Jacqueline Bird  CHY:[id number] DOB: 11-07-1950 DOA: 06/14/2022 PCP: Celene Squibb, MD  Brief Narrative:   72 year old white female DM TY 2 HTN HLD  squamous cell CA anus stage IIIc in remission-follows with Dr. Delton Coombes oncology LLE DVT on apixaban HFpEF admission 1/3 through 05/27/2019   s/p accidental mechanical fall 06/14/2022 Presented to [facility] ED 1/30-arrived at [facility] after unavoidable delay in transfer to [facility], orthopedics consulted  Hospital-Problem based course  Comminuted severe fracture of left hip, s/p open treatment with intramedullary implant - Wound care, weightbearing precautions, anticoagulation and outpatient follow-up as per orthopedics -Continue Tylenol 1000 every 6, oxycodone 5-10 for moderate pain and 10-15 for severe, may use Dilaudid for severe pain - Physical therapy recommends skilled over the next several days  DM TY 2 - Resumed short acting sliding scale, add back long-acting insulin at lower dose of 10 units-on discharge increase back to higher dosing based on sugars - CBGs ranging 200 range but was on D5 until this a.m.-adjust meds - Trulicity 8.78 q. Sunday, resumed Farxiga 10   Squamous cell CA anus stage III - Outpatient follow-up with Dr. Delton Coombes  HFpEF - Lasix 20 held from admission.  Start back at 20 q. OD  Left lower extremity DVT in the past - Eliquis 5 twice resumed on 2/1  Anxiety/depression - Longstanding and has been on meds for a long time - Continue Xanax 0.5 at bedtime, sertraline 100 daily, trazodone daily  DVT prophylaxis: SCD at this time Code Status: DNR Family Communication: D/W daughter, son at the bedside  Disposition:  Status is: Inpatient Remains inpatient appropriate because:   Require skilled placement on discharge    Subjective:  Therapy reports that patient was having significant difficulty getting up in the bed, and was slow to progress Long discussion with the patient about mobilizing  and working with therapy in the presence of her family Her pain seems moderately controlled She is not eating, possibly because she does not enjoy the type of foods that she is on so I will liberalize her diet   Objective: Vitals:   06/17/22 1947 06/17/22 2324 06/18/22 0330 06/18/22 0743  BP: (!) 122/50 (!) 132/51 (!) 145/45 (!) 151/68  Pulse: 90 82  81  Resp: '16 18 18   '$ Temp:  97.9 F (36.6 C) (!) 97.5 F (36.4 C)   TempSrc:  Oral Oral   SpO2: 94% 100% 95% 100%  Weight:      Height:        Intake/Output Summary (Last 24 hours) at 06/18/2022 0931 Last data filed at 06/18/2022 0542 Gross per 24 hour  Intake 2913.66 ml  Output 180 ml  Net 2733.66 ml    Filed Weights   06/14/22 1719  Weight: 81.2 kg    Examination:  EOMI NCAT no focal deficit Chest is clear no wheeze no rales no rhonchi Abdomen soft nontender no rebound Left hip has bandages over it and straight leg raise was quite antalgic even to lifting the knee off the bed Neuro is intact otherwise 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  Data Reviewed: personally reviewed   CBC    Component Value Date/Time   WBC 5.9 06/18/2022 0437   RBC 3.94 06/18/2022 0437   HGB 11.6 (L) 06/18/2022 0437   HCT 34.7 (L) 06/18/2022 0437   PLT 216 06/18/2022 0437   MCV 88.1 06/18/2022 0437   MCH 29.4 06/18/2022 0437  MCHC 33.4 06/18/2022 0437   RDW 13.6 06/18/2022 0437   LYMPHSABS 1.0 06/14/2022 1730   MONOABS 0.5 06/14/2022 1730   EOSABS 0.1 06/14/2022 1730   BASOSABS 0.0 06/14/2022 1730      Latest Ref Rng & Units 06/18/2022    4:37 AM 06/16/2022    7:19 AM 06/15/2022    4:37 AM  CMP  Glucose 70 - 99 mg/dL 219  139  133   BUN 8 - 23 mg/dL '23  12  16   '$ Creatinine 0.44 - 1.00 mg/dL 0.73  0.56  0.64   Sodium 135 - 145 mmol/L 134  134  134   Potassium 3.5 - 5.1 mmol/L 4.0  3.9  3.7   Chloride 98 - 111 mmol/L 99  102  103   CO2 22 - 32 mmol/L '25  22  26   '$ Calcium 8.9 - 10.3 mg/dL 8.5   8.4  8.3   Total Protein 6.5 - 8.1 g/dL   6.7   Total Bilirubin 0.3 - 1.2 mg/dL   0.5   Alkaline Phos 38 - 126 U/L   65   AST 15 - 41 U/L   17   ALT 0 - 44 U/L   15      Radiology Studies: Pelvis Portable  Result Date: 06/17/2022 CLINICAL DATA:  History of left hip surgery. EXAM: PORTABLE PELVIS 1-2 VIEWS COMPARISON:  Pelvis and left hip radiographs 06/14/2022 FINDINGS: Interval long femoral intramedullary nail fixation the previously seen displaced and comminuted intertrochanteric fracture of the proximal femur. Improved alignment. No evidence of hardware failure. Mild lateral displacement of the cortex of the distal aspect of the greater trochanter fracture fragment and mild medial displacement of the lesser trochanter fracture fragment. Mild-to-moderate bilateral superomedial femoroacetabular joint space narrowing. At high-grade vascular calcifications. Proximal distal lateral thigh and lateral pelvis surgical skin staples. Lateral left thigh postoperative subcutaneous air. IMPRESSION: Interval long femoral intramedullary nail fixation the previously seen displaced and comminuted intertrochanteric fracture of the proximal femur. Improved alignment. Electronically Signed   By: Yvonne Kendall M.D.   On: 06/17/2022 18:01   DG HIP UNILAT WITH PELVIS 2-3 VIEWS LEFT  Result Date: 06/17/2022 CLINICAL DATA:  [id number] Elective surgery [id number] EXAM: DG HIP (WITH OR WITHOUT PELVIS) 2-3V LEFT COMPARISON:  CT 06/14/2022 FINDINGS: Intraoperative images during intramedullary nailing for intertrochanteric fracture of the proximal left femur. Intact hardware. Improved fracture alignment. IMPRESSION: Intraoperative images during left proximal femur ORIF with intramedullary nail. Improved fracture alignment. No evidence of immediate complication. Electronically Signed   By: Maurine Simmering M.D.   On: 06/17/2022 15:36   DG C-Arm 1-60 Min-No Report  Result Date: 06/17/2022 Fluoroscopy was utilized by the requesting  physician.  No radiographic interpretation.     Scheduled Meds:  acetaminophen  1,000 mg Oral Q6H   ALPRAZolam  0.5 mg Oral QHS   atorvastatin  80 mg Oral QPM   Chlorhexidine Gluconate Cloth  6 each Topical Daily   docusate sodium  100 mg Oral BID   gabapentin  100 mg Oral QHS   gabapentin  200 mg Oral Daily   insulin aspart  0-5 Units Subcutaneous QHS   insulin aspart  0-9 Units Subcutaneous TID WC   mupirocin ointment  1 Application Nasal BID   ondansetron  4 mg Intravenous Once   sertraline  100 mg Oral Daily   traZODone  100 mg Oral QHS   Continuous Infusions:  sodium chloride     methocarbamol (ROBAXIN) IV  tranexamic acid       LOS: 4 days   Time spent: [place], MD [facility] To contact the attending provider between 7A-7P or the covering provider during after hours 7P-7A, please log into the web site [email or web address] and access using universal [place] password for that web site. If you do not have the password, please call the hospital operator.  06/18/2022, 9:31 AM

## 2022-06-19 ENCOUNTER — Encounter (HOSPITAL_COMMUNITY): Admission: RE | Payer: Self-pay | Source: Home / Self Care

## 2022-06-19 ENCOUNTER — Ambulatory Visit (HOSPITAL_COMMUNITY): Admission: RE | Admit: 2022-06-19 | Payer: Medicare HMO | Source: Home / Self Care | Admitting: Gastroenterology

## 2022-06-19 ENCOUNTER — Encounter (HOSPITAL_COMMUNITY): Payer: Self-pay | Admitting: Orthopaedic Surgery

## 2022-06-19 DIAGNOSIS — S72142A Displaced intertrochanteric fracture of left femur, initial encounter for closed fracture: Secondary | ICD-10-CM | POA: Diagnosis not present

## 2022-06-19 LAB — BASIC METABOLIC PANEL
Anion gap: 8 (ref 5–15)
BUN: 28 mg/dL — ABNORMAL HIGH (ref 8–23)
CO2: 27 mmol/L (ref 22–32)
Calcium: 8.4 mg/dL — ABNORMAL LOW (ref 8.9–10.3)
Chloride: 100 mmol/L (ref 98–111)
Creatinine, Ser: 0.74 mg/dL (ref 0.44–1.00)
GFR, Estimated: >60 mL/min (ref >60)
Glucose, Bld: 209 mg/dL — ABNORMAL HIGH (ref 70–99)
Potassium: 3.9 mmol/L (ref 3.5–5.1)
Sodium: 135 mmol/L (ref 135–145)

## 2022-06-19 LAB — GLUCOSE, CAPILLARY
Glucose-Capillary: 230 mg/dL — ABNORMAL HIGH (ref 70–99)
Glucose-Capillary: 275 mg/dL — ABNORMAL HIGH (ref 70–99)
Glucose-Capillary: 173 mg/dL — ABNORMAL HIGH (ref 70–99)
Glucose-Capillary: 179 mg/dL — ABNORMAL HIGH (ref 70–99)

## 2022-06-19 SURGERY — COLONOSCOPY WITH PROPOFOL
Anesthesia: Monitor Anesthesia Care

## 2022-06-19 MED ORDER — POLYETHYLENE GLYCOL 3350 17 G PO PACK
17.0000 g | PACK | Freq: Every day | ORAL | Status: DC
  Administered 2022-06-19: 17 g via ORAL
  Filled 2022-06-19 (×2): qty 1

## 2022-06-19 MED ORDER — MAGNESIUM CITRATE PO SOLN: 1.0000 | Freq: Once | ORAL | Status: DC

## 2022-06-19 MED ORDER — INSULIN ASPART 100 UNIT/ML IJ SOLN
3.0000 [IU] | Freq: Three times a day (TID) | INTRAMUSCULAR | Status: DC
  Administered 2022-06-19 – 2022-06-23 (×12): 3 [IU] via SUBCUTANEOUS

## 2022-06-19 MED ORDER — SENNOSIDES-DOCUSATE SODIUM 8.6-50 MG PO TABS
1.0000 | ORAL_TABLET | Freq: Every day | ORAL | Status: DC
  Administered 2022-06-19 – 2022-06-20 (×2): 1 via ORAL
  Filled 2022-06-19 (×2): qty 1

## 2022-06-19 MED ORDER — INSULIN DETEMIR 100 UNIT/ML ~~LOC~~ SOLN
15.0000 [IU] | Freq: Every day | SUBCUTANEOUS | Status: DC
  Administered 2022-06-19 – 2022-06-22 (×4): 15 [IU] via SUBCUTANEOUS
  Filled 2022-06-19 (×5): qty 0.15

## 2022-06-19 MED ORDER — MAGNESIUM CITRATE PO SOLN
1.0000 | ORAL | Status: AC
  Administered 2022-06-19: 1 via ORAL
  Filled 2022-06-19: qty 296

## 2022-06-19 MED ORDER — INSULIN ASPART 100 UNIT/ML IJ SOLN
0.0000 [IU] | Freq: Three times a day (TID) | INTRAMUSCULAR | Status: DC
  Administered 2022-06-19: 2 [IU] via SUBCUTANEOUS
  Administered 2022-06-20: 1 [IU] via SUBCUTANEOUS
  Administered 2022-06-20 – 2022-06-21 (×2): 2 [IU] via SUBCUTANEOUS
  Administered 2022-06-21: 3 [IU] via SUBCUTANEOUS
  Administered 2022-06-22: 1 [IU] via SUBCUTANEOUS
  Administered 2022-06-22 – 2022-06-23 (×2): 2 [IU] via SUBCUTANEOUS

## 2022-06-19 NOTE — Progress Notes (Signed)
Patient stable.  Mobilizing slowly with PT due to hip pain.  Anticipate will need short term SNF.  Stable from ortho stand point.  Follow up in 2 weeks for staple removal and repeat xrays.

## 2022-06-19 NOTE — Progress Notes (Incomplete)
Pt refusing to take enema at this time, verbalizes she had a long day and  does not want to be bothered with that. Pt is assisted on bsc to urinate, pt voids informed of POC returns back in bed, repositioned, encouraged to elevate extremity that is refused at this time. Pain meds given

## 2022-06-19 NOTE — Progress Notes (Signed)
Physical Therapy Treatment Patient Details Name: Jacqueline Bird MRN: [id number] DOB: Sep 30, 1950 Today's Date: 06/19/2022   History of Present Illness 72 yo female admitted 1/28 after fall at home with left femur fx. S/p Lt femur ORIF 1/31. 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 Pt premedicated and continues to report pain in LLE with all movement. Pt max +2 - total +2 for all mobility with pivot to [facility] and transition to chair. Pt educated for HEP and progressive transfers. Will continue to follow.     Recommendations for follow up therapy are one component of a multi-disciplinary discharge planning process, led by the attending physician.  Recommendations may be updated based on patient status, additional functional criteria and insurance authorization.  Follow Up Recommendations  Skilled nursing-short term rehab (<3 hours/day) Can patient physically be transported by private vehicle: No   Assistance Recommended at Discharge Frequent or constant Supervision/Assistance  Patient can return home with the following Two people to help with walking and/or transfers;A lot of help with bathing/dressing/bathroom;Assistance with cooking/housework;Assist for transportation;Help with stairs or ramp for entrance   Equipment Recommendations  Wheelchair (measurements PT);BSC/3in1    Recommendations for Other Services       Precautions / Restrictions Precautions Precautions: Fall;Other (comment) Precaution Comments: L hand amputation Restrictions LLE Weight Bearing: Weight bearing as tolerated     Mobility  Bed Mobility Overal bed mobility: Needs Assistance Bed Mobility: Supine to Sit     Supine to sit: Max assist, +2 for physical assistance, +2 for safety/equipment, HOB elevated     General bed mobility comments: HOB 20 degrees with max +2 assist to pivot to EOB with pad, rail  and physical assist to move legs and trunk into sitting. max multimodal cues    Transfers Overall transfer level: Needs assistance   Transfers: Sit to/from Stand, Bed to chair/wheelchair/BSC Sit to Stand: Max assist, +2 physical assistance, +2 safety/equipment           General transfer comment: max +2 attempted use of platform RW but pt unable to successfully stand. transitioned to Max +2 with bil UE hooking and bil knees blocked to rise from bed and total assist to pivot bed to [facility]. stood from [facility] x 2 with max +2 assist for rise and anterior translation, pt maintaining flexed trunk and posterior lean. BSC switched for recliner behind pt. total assist for pericare and total +2 to scoot back in chair    Ambulation/Gait               General Gait Details: unable   Stairs             Wheelchair Mobility    Modified Rankin (Stroke Patients Only)       Balance Overall balance assessment: Needs assistance Sitting-balance support: Bilateral upper extremity supported Sitting balance-Leahy Scale: Fair Sitting balance - Comments: EOB with UE support   Standing balance support: Bilateral upper extremity supported Standing balance-Leahy Scale: Poor Standing balance comment: bil UE hooked in standing and reliant on +2 assist                            Cognition Arousal/Alertness: Awake/alert Behavior During Therapy: Anxious, Flat affect Overall Cognitive Status: Difficult to assess Area of Impairment: Following commands, Safety/judgement, Awareness, Problem solving, Orientation  Orientation Level: Disoriented to, Time     Following Commands: Follows one step commands inconsistently, Follows one step commands with increased time Safety/Judgement: Decreased awareness of safety, Decreased awareness of deficits Awareness: Intellectual Problem Solving: Slow processing, Decreased initiation, Difficulty sequencing, Requires verbal cues,  Requires tactile cues General Comments: pt anxious and requires increased time and cues, cannot dual task, used hearing aids with improvement in command following. Pt fearful of falling and will resist at times despite max cues due to attempting to prevent fall        Exercises General Exercises - Lower Extremity Long Arc Quad: AAROM, AROM, Right, Left, Seated, 15 reps (AAROM on LLE) Heel Slides: AAROM, Left, Supine, 5 reps Hip ABduction/ADduction: AAROM, Left, Supine, 5 reps Hip Flexion/Marching: AAROM, Both, Seated, 10 reps    General Comments        Pertinent Vitals/Pain Pain Assessment Pain Score: 6  Pain Location: L LE Pain Descriptors / Indicators: Guarding, Grimacing, Moaning Pain Intervention(s): Limited activity within patient's tolerance, Repositioned, Monitored during session, Premedicated before session    Home Living                          Prior Function            PT Goals (current goals can now be found in the care plan section) Progress towards PT goals: Progressing toward goals    Frequency    Min 4X/week      PT Plan Current plan remains appropriate    Co-evaluation              AM-PAC PT "6 Clicks" Mobility   Outcome Measure  Help needed turning from your back to your side while in a flat bed without using bedrails?: A Lot Help needed moving from lying on your back to sitting on the side of a flat bed without using bedrails?: Total Help needed moving to and from a bed to a chair (including a wheelchair)?: Total Help needed standing up from a chair using your arms (e.g., wheelchair or bedside chair)?: Total Help needed to walk in hospital room?: Total Help needed climbing 3-5 steps with a railing? : Total 6 Click Score: 7    End of Session Equipment Utilized During Treatment: Gait belt Activity Tolerance: Patient tolerated treatment well Patient left: with call bell/phone within reach;in chair;with chair alarm set Nurse  Communication: Mobility status;Precautions (lateral scoot or +2 max assist with moving equipment rather than pivot) PT Visit Diagnosis: Other abnormalities of gait and mobility (R26.89);History of falling (Z91.81);Muscle weakness (generalized) (M62.81)     Time: 3888-2800 PT Time Calculation (min) (ACUTE ONLY): 40 min  Charges:  $Therapeutic Activity: 38-52 mins                     Bayard Males, PT Acute Rehabilitation Services Office: [phone]    Laisha Rau B Auna Mikkelsen 06/19/2022, 10:20 AM

## 2022-06-19 NOTE — TOC Progression Note (Signed)
Transition of Care [facility]) - Progression Note    Patient Details  Name: Jacqueline Bird MRN: [id number] Date of Birth: [birth year]  Transition of Care [facility]) CM/SW Contact  Jinger Neighbors, [place] Phone Number: 06/19/2022, 3:58 PM  Clinical Narrative:     CSW reviewed and printed bed offers. CSW met with pt at bedside and called pt's son, Erlene Quan, via phone to discuss bed options. Pt asked for a few hours to review. CSW followed up and pt expressed to Alianza she is interested in [facility]. CSW contacted [facility]- admissions director to make her aware. They're going to start auth and follow up with TOC.   Expected Discharge Plan: Skilled Nursing Facility Barriers to Discharge: Continued Medical Work up  Expected Discharge Plan and Services                                               Social Determinants of Health (SDOH) Interventions SDOH Screenings   Food Insecurity: No Food Insecurity (06/16/2022)  Housing: Low Risk  (06/16/2022)  Transportation Needs: No Transportation Needs (06/16/2022)  Utilities: Not At Risk (06/16/2022)  Depression (PHQ2-9): High Risk (02/05/2022)  Financial Resource Strain: Medium Risk (02/06/2019)  Physical Activity: Sufficiently Active (02/06/2019)  Social Connections: Moderately Integrated (02/06/2019)  Stress: Stress Concern Present (02/06/2019)  Tobacco Use: Low Risk  (06/19/2022)    Readmission Risk Interventions     No data to display

## 2022-06-19 NOTE — Inpatient Diabetes Management (Signed)
Inpatient Diabetes Program Recommendations  AACE/ADA: New Consensus Statement on Inpatient Glycemic Control (2015)  Target Ranges:  Prepandial:   less than 140 mg/dL      Peak postprandial:   less than 180 mg/dL (1-2 hours)      Critically ill patients:  140 - 180 mg/dL   Lab Results  Component Value Date   GLUCAP 230 (H) 06/19/2022   HGBA1C 6.7 (H) 06/16/2022    Latest Reference Range & Units 06/18/22 07:45 06/18/22 11:52 06/18/22 15:51 06/18/22 21:49 06/18/22 22:31 06/19/22 07:21  Glucose-Capillary 70 - 99 mg/dL 213 (H) 219 (H) 270 (H) 193 (H) 187 (H) 230 (H)  (H): Data is abnormally high  Diabetes history: Type 2 Dm Outpatient Diabetes medications: Trulicity .75 mg qwk, Farxiga 10 mg QD, Tresiba 28 units QD, Novolog 4 units TID Current orders for Inpatient glycemic control: Levemir 10 units qd, Novolog 0-9 units TID & 0-5 HS  Inpatient Diabetes Program Recommendations:   Please consider: -Increase Levemir to 12 units qd -Add Novolog 2 units tid meal coverage if eats 50% meals  Thank you, Nani Gasser. Rondale Nies, RN, MSN, CDE  Diabetes Coordinator Inpatient Glycemic Control Team Team Pager [phone] (8am-5pm) 06/19/2022 11:18 AM

## 2022-06-19 NOTE — Progress Notes (Signed)
PROGRESS NOTE   Jacqueline Bird  CVE:[id number] DOB: 1950-10-08 DOA: 06/14/2022 PCP: Celene Squibb, MD  Brief Narrative:   72 year old white female DM TY 2 HTN HLD  squamous cell CA anus stage IIIc in remission-follows with Dr. Delton Coombes oncology LLE DVT on apixaban HFpEF admission 1/3 through 05/27/2019   s/p accidental mechanical fall 06/14/2022 Presented to [facility] ED 1/30-arrived at [facility] after unavoidable delay in transfer to [facility], orthopedics consulted  Hospital-Problem based course  Comminuted severe fracture of left hip, s/p open treatment with intramedullary implant - WBAT, on Eliquis and resumed 5 bid--wound care,  outpatient follow-up as per orthopedics -Continue Tylenol 1000 every 6, oxycodone 5-10 for moderate pain and 10-15 for severe, stop IV dilaudid in am if able - Physical therapy recommends skilled   Acute Urinary retention -foley in/out 2/1 -retaining ~ 500 cc today-Constipation an issue confounding--place Foley indwelling 2/2--nursing informed to place Foley catheter and this will probably be indwelling and will need outpatient management -OP voiding trial  Constipation contributing to #2 -Patient needs scheduled mag citrate 1 bottle now, - Will continue MiraLAX daily 17 g as well as Senokot-S daily - If no improvement will repeat fleets enema  DM TY 2 - Increased Levemir to 15 units - CBGs elevated up to 280--adding mealtime coverage 3 units with short acting sliding scale sensitive - Trulicity 0.25 q. Sunday, resumed Farxiga 10   Squamous cell CA anus stage III - Outpatient follow-up with Dr. Delton Coombes  HFpEF - Lasix 20 held given slightly rising creatinine  Left lower extremity DVT in the past - Eliquis 5 twice resumed on 2/1  Anxiety/depression - Longstanding and has been on meds for a long time - Continue Xanax 0.5 at bedtime, sertraline 100 daily, trazodone daily  DVT prophylaxis: SCD at this time Code Status: DNR Family Communication: D/W  daughter, son at the bedside on 2/2 Disposition:  Status is: Inpatient Remains inpatient appropriate because:   Require skilled placement on discharge likely on 06/22/2022 earliest    Subjective:  Slow progress with therapy Nursing reports patient unable to really stand to toilet refusing and spitting out meds with cognition issues Refused meds for constipation which I have redosed and added to as above   Objective: Vitals:   06/18/22 0743 06/18/22 1551 06/18/22 2045 06/19/22 0724  BP: (!) 151/68 (!) 107/40 93/72 (!) 133/45  Pulse: 81 67 87 80  Resp:   17 18  Temp:   98.7 F (37.1 C) 98.1 F (36.7 C)  TempSrc:   Oral Oral  SpO2: 100% 96% 96% 96%  Weight:      Height:        Intake/Output Summary (Last 24 hours) at 06/19/2022 1435 Last data filed at 06/18/2022 1800 Gross per 24 hour  Intake --  Output 715 ml  Net -715 ml    Filed Weights   06/14/22 1719  Weight: 81.2 kg    Examination:  EOMI NCAT no focal deficit Chest clear no rhonchi no wheeze S1-S2 no murmur Abdomen distended with significant discomfort on pressure at umbilicus-I feel like she is retaining urine No lower extremity edema  Data Reviewed: personally reviewed   CBC    Component Value Date/Time   WBC 5.9 06/18/2022 0437   RBC 3.94 06/18/2022 0437   HGB 11.6 (L) 06/18/2022 0437   HCT 34.7 (L) 06/18/2022 0437   PLT 216 06/18/2022 0437   MCV 88.1 06/18/2022 0437   MCH 29.4 06/18/2022 0437   MCHC 33.4 06/18/2022 0437  RDW 13.6 06/18/2022 0437   LYMPHSABS 1.0 06/14/2022 1730   MONOABS 0.5 06/14/2022 1730   EOSABS 0.1 06/14/2022 1730   BASOSABS 0.0 06/14/2022 1730      Latest Ref Rng & Units 06/19/2022    5:49 AM 06/18/2022    4:37 AM 06/16/2022    7:19 AM  CMP  Glucose 70 - 99 mg/dL 209  219  139   BUN 8 - 23 mg/dL '28  23  12   '$ Creatinine 0.44 - 1.00 mg/dL 0.74  0.73  0.56   Sodium 135 - 145 mmol/L 135  134  134   Potassium 3.5 - 5.1 mmol/L 3.9  4.0  3.9   Chloride 98 - 111 mmol/L 100   99  102   CO2 22 - 32 mmol/L '27  25  22   '$ Calcium 8.9 - 10.3 mg/dL 8.4  8.5  8.4      Radiology Studies: Pelvis Portable  Result Date: 06/17/2022 CLINICAL DATA:  History of left hip surgery. EXAM: PORTABLE PELVIS 1-2 VIEWS COMPARISON:  Pelvis and left hip radiographs 06/14/2022 FINDINGS: Interval long femoral intramedullary nail fixation the previously seen displaced and comminuted intertrochanteric fracture of the proximal femur. Improved alignment. No evidence of hardware failure. Mild lateral displacement of the cortex of the distal aspect of the greater trochanter fracture fragment and mild medial displacement of the lesser trochanter fracture fragment. Mild-to-moderate bilateral superomedial femoroacetabular joint space narrowing. At high-grade vascular calcifications. Proximal distal lateral thigh and lateral pelvis surgical skin staples. Lateral left thigh postoperative subcutaneous air. IMPRESSION: Interval long femoral intramedullary nail fixation the previously seen displaced and comminuted intertrochanteric fracture of the proximal femur. Improved alignment. Electronically Signed   By: Yvonne Kendall M.D.   On: 06/17/2022 18:01   DG HIP UNILAT WITH PELVIS 2-3 VIEWS LEFT  Result Date: 06/17/2022 CLINICAL DATA:  [id number] Elective surgery [id number] EXAM: DG HIP (WITH OR WITHOUT PELVIS) 2-3V LEFT COMPARISON:  CT 06/14/2022 FINDINGS: Intraoperative images during intramedullary nailing for intertrochanteric fracture of the proximal left femur. Intact hardware. Improved fracture alignment. IMPRESSION: Intraoperative images during left proximal femur ORIF with intramedullary nail. Improved fracture alignment. No evidence of immediate complication. Electronically Signed   By: Maurine Simmering M.D.   On: 06/17/2022 15:36   DG C-Arm 1-60 Min-No Report  Result Date: 06/17/2022 Fluoroscopy was utilized by the requesting physician.  No radiographic interpretation.     Scheduled Meds:  ALPRAZolam  0.5 mg Oral  QHS   apixaban  5 mg Oral BID   atorvastatin  80 mg Oral QPM   Chlorhexidine Gluconate Cloth  6 each Topical Daily   dapagliflozin propanediol  10 mg Oral Daily   [START ON 06/21/2022] Dulaglutide  0.75 mg Intramuscular Q Sun   gabapentin  100 mg Oral QHS   gabapentin  200 mg Oral Daily   insulin aspart  0-5 Units Subcutaneous QHS   insulin aspart  0-9 Units Subcutaneous TID WC   insulin detemir  10 Units Subcutaneous Daily   mupirocin ointment  1 Application Nasal BID   ondansetron  4 mg Intravenous Once   sertraline  100 mg Oral Daily   sodium phosphate  1 enema Rectal Once   traZODone  100 mg Oral QHS   Continuous Infusions:  methocarbamol (ROBAXIN) IV     tranexamic acid       LOS: 5 days   Time spent: [place], MD [facility] To contact the attending provider between 7A-7P or the  covering provider during after hours 7P-7A, please log into the web site [email or web address] and access using universal [place] password for that web site. If you do not have the password, please call the hospital operator.  06/19/2022, 2:35 PM

## 2022-06-20 DIAGNOSIS — S72142A Displaced intertrochanteric fracture of left femur, initial encounter for closed fracture: Secondary | ICD-10-CM | POA: Diagnosis not present

## 2022-06-20 LAB — BASIC METABOLIC PANEL
Anion gap: 7 (ref 5–15)
BUN: 24 mg/dL — ABNORMAL HIGH (ref 8–23)
CO2: 29 mmol/L (ref 22–32)
Calcium: 8.6 mg/dL — ABNORMAL LOW (ref 8.9–10.3)
Chloride: 102 mmol/L (ref 98–111)
Creatinine, Ser: 0.61 mg/dL (ref 0.44–1.00)
GFR, Estimated: >60 mL/min (ref >60)
Glucose, Bld: 173 mg/dL — ABNORMAL HIGH (ref 70–99)
Potassium: 4 mmol/L (ref 3.5–5.1)
Sodium: 138 mmol/L (ref 135–145)

## 2022-06-20 LAB — GLUCOSE, CAPILLARY
Glucose-Capillary: 119 mg/dL — ABNORMAL HIGH (ref 70–99)
Glucose-Capillary: 189 mg/dL — ABNORMAL HIGH (ref 70–99)
Glucose-Capillary: 128 mg/dL — ABNORMAL HIGH (ref 70–99)
Glucose-Capillary: 134 mg/dL — ABNORMAL HIGH (ref 70–99)

## 2022-06-20 MED ORDER — DIPHENOXYLATE-ATROPINE 2.5-0.025 MG PO TABS
1.0000 | ORAL_TABLET | Freq: Once | ORAL | Status: AC
  Administered 2022-06-20: 1 via ORAL
  Filled 2022-06-20: qty 1

## 2022-06-20 MED ORDER — POLYETHYLENE GLYCOL 3350 17 G PO PACK: 17.0000 g | PACK | Freq: Every day | ORAL | Status: DC | PRN

## 2022-06-20 NOTE — Progress Notes (Signed)
PROGRESS NOTE   Jacqueline Bird  RCV:[id number] DOB: 1950-05-22 DOA: 06/14/2022 PCP: Celene Squibb, MD  Brief Narrative:   72 year old white female DM TY 2 HTN HLD  squamous cell CA anus stage IIIc in remission-follows with Dr. Delton Coombes oncology LLE DVT on apixaban HFpEF admission 1/3 through 05/27/2019   s/p accidental mechanical fall 06/14/2022 Presented to [facility] ED 1/30-arrived at [facility] after unavoidable delay in transfer to [facility], orthopedics consulted  Hospital-Problem based course  Comminuted severe fracture of left hip, s/p open treatment with intramedullary implant - WBAT, on Eliquis  5 bid--rest per orthopedics -Continue Tylenol 1000 every 6, oxycodone 5-10 for moderate pain and 10-15 for severe, stop IV dilaudid  - Physical therapy recommends skilled   AKI resulting from Acute Urinary retention -foley in/out 2/1 -retaining ~ 500 cc 2/2  - Foley indwelling 2/2 placed--attempt void trial today 06/20/22  Constipation  resolved - Will continue MiraLAX daily 17 g prn  DM TY 2 - CBGs elevated 06/19/2022,-insulins adjusted + mealtime 3 units, Levemir now 15 units at bedtime - CBGs currently 102-585 - Trulicity 2.77 q. Sunday, resumed Farxiga 10   Squamous cell CA anus stage III - Outpatient follow-up with Dr. Delton Coombes  HFpEF - Lasix 20 held given slightly rising creatinine, resume as OP   Left lower extremity DVT in the past - Eliquis 5 twice resumed on 2/1  Anxiety/depression - has been on meds for a long time - Continue Xanax 0.5 at bedtime, sertraline 100 daily, trazodone daily  DVT prophylaxis: SCD at this time Code Status: DNR Family Communication:  Disposition:  Status is: Inpatient Remains inpatient appropriate because:   Require skilled placement on discharge likely on 06/22/2022 earliest    Subjective:  Multiple several stool No cp Worried about gong to rehab No fever no chills   Objective: Vitals:   06/19/22 2022 06/20/22 0442 06/20/22  0442 06/20/22 0731  BP: (!) 118/45 135/60 135/60 (!) 131/51  Pulse: 95 88 88 89  Resp: '18  17 18  '$ Temp: 99.2 F (37.3 C) 98.9 F (37.2 C) 98.9 F (37.2 C) 98 F (36.7 C)  TempSrc: Oral Oral Oral Oral  SpO2: 97% 93% 93% 95%  Weight:      Height:        Intake/Output Summary (Last 24 hours) at 06/20/2022 1023 Last data filed at 06/20/2022 0500 Gross per 24 hour  Intake --  Output 3025 ml  Net -3025 ml    Filed Weights   06/14/22 1719  Weight: 81.2 kg    Examination:  Eomi ncat no focal deficit Cta b no added sound no wheeze no rales rhonchi Abd soft nt nd no rebound no guard Neuro intact and moving limbs x4 but severe limitation to LLE movement  Data Reviewed: personally reviewed   CBC    Component Value Date/Time   WBC 5.9 06/18/2022 0437   RBC 3.94 06/18/2022 0437   HGB 11.6 (L) 06/18/2022 0437   HCT 34.7 (L) 06/18/2022 0437   PLT 216 06/18/2022 0437   MCV 88.1 06/18/2022 0437   MCH 29.4 06/18/2022 0437   MCHC 33.4 06/18/2022 0437   RDW 13.6 06/18/2022 0437   LYMPHSABS 1.0 06/14/2022 1730   MONOABS 0.5 06/14/2022 1730   EOSABS 0.1 06/14/2022 1730   BASOSABS 0.0 06/14/2022 1730      Latest Ref Rng & Units 06/20/2022    2:29 AM 06/19/2022    5:49 AM 06/18/2022    4:37 AM  CMP  Glucose 70 -  99 mg/dL 173  209  219   BUN 8 - 23 mg/dL '24  28  23   '$ Creatinine 0.44 - 1.00 mg/dL 0.61  0.74  0.73   Sodium 135 - 145 mmol/L 138  135  134   Potassium 3.5 - 5.1 mmol/L 4.0  3.9  4.0   Chloride 98 - 111 mmol/L 102  100  99   CO2 22 - 32 mmol/L '29  27  25   '$ Calcium 8.9 - 10.3 mg/dL 8.6  8.4  8.5    Radiology Studies: No results found.  Scheduled Meds:  ALPRAZolam  0.5 mg Oral QHS   apixaban  5 mg Oral BID   atorvastatin  80 mg Oral QPM   Chlorhexidine Gluconate Cloth  6 each Topical Daily   dapagliflozin propanediol  10 mg Oral Daily   [START ON 06/21/2022] Dulaglutide  0.75 mg Intramuscular Q Sun   gabapentin  100 mg Oral QHS   gabapentin  200 mg Oral Daily    insulin aspart  0-9 Units Subcutaneous TID WC   insulin aspart  3 Units Subcutaneous TID WC   insulin detemir  15 Units Subcutaneous QHS   mupirocin ointment  1 Application Nasal BID   ondansetron  4 mg Intravenous Once   polyethylene glycol  17 g Oral Daily   senna-docusate  1 tablet Oral Daily   sertraline  100 mg Oral Daily   traZODone  100 mg Oral QHS   Continuous Infusions:  methocarbamol (ROBAXIN) IV     tranexamic acid       LOS: 6 days   Time spent: 54  Nita Sells, MD [facility] To contact the attending provider between 7A-7P or the covering provider during after hours 7P-7A, please log into the web site [email or web address] and access using universal [place] password for that web site. If you do not have the password, please call the hospital operator.  06/20/2022, 10:23 AM

## 2022-06-20 NOTE — Progress Notes (Addendum)
Patient was ok  to have SCD to L/Lower leg andTed hose to R/lower leg

## 2022-06-21 DIAGNOSIS — S72142A Displaced intertrochanteric fracture of left femur, initial encounter for closed fracture: Secondary | ICD-10-CM | POA: Diagnosis not present

## 2022-06-21 LAB — BASIC METABOLIC PANEL
Anion gap: 9 (ref 5–15)
BUN: 14 mg/dL (ref 8–23)
CO2: 29 mmol/L (ref 22–32)
Calcium: 8.4 mg/dL — ABNORMAL LOW (ref 8.9–10.3)
Chloride: 99 mmol/L (ref 98–111)
Creatinine, Ser: 0.6 mg/dL (ref 0.44–1.00)
GFR, Estimated: >60 mL/min (ref >60)
Glucose, Bld: 121 mg/dL — ABNORMAL HIGH (ref 70–99)
Potassium: 3.9 mmol/L (ref 3.5–5.1)
Sodium: 137 mmol/L (ref 135–145)

## 2022-06-21 LAB — GLUCOSE, CAPILLARY
Glucose-Capillary: 120 mg/dL — ABNORMAL HIGH (ref 70–99)
Glucose-Capillary: 198 mg/dL — ABNORMAL HIGH (ref 70–99)
Glucose-Capillary: 212 mg/dL — ABNORMAL HIGH (ref 70–99)
Glucose-Capillary: 191 mg/dL — ABNORMAL HIGH (ref 70–99)
Glucose-Capillary: 169 mg/dL — ABNORMAL HIGH (ref 70–99)

## 2022-06-21 NOTE — Progress Notes (Addendum)
PROGRESS NOTE   Jacqueline Bird  VZC:[id number] DOB: [birth year] DOA: 06/14/2022 PCP: Celene Squibb, MD  Brief Narrative:   72 year old white female DM TY 2 HTN HLD  squamous cell CA anus stage IIIc in remission-follows with Dr. Delton Coombes oncology LLE DVT on apixaban HFpEF admission 1/3 through 05/27/2019   s/p accidental mechanical fall 06/14/2022 Presented to [facility] ED 1/30-arrived at [facility] after unavoidable delay in transfer to [facility], orthopedics consulted 1/31 ORIF intramedullary implant Dr. Erlinda Hong  Hospital-Problem based course  Comminuted severe fracture of left hip, s/p surgery - WBAT, on Eliquis  5 bid--rest per orthopedics -Continue Tylenol 1000 every 6, oxycodone 5-10 for moderate pain and 10-15 for severe, stop IV dilaudid  - Physical therapy recommends skilled   AKI resulting from Acute Urinary retention -foley in/out 2/1-retaining ~ 500 cc 2/2 - AKI has resolved - Foley indwelling 2/2 placed--failed voiding trial 06/20/22 and will need outpatient evaluation by urology arranged by skilled physician for voiding trial  Constipation resolved - Will continue MiraLAX daily 17 g prn only  DM TY 2 -Insulins adjusted + mealtime 3 units, Levemir now 15 units at bedtime with hyperglycemia this hospitalization - CBGs currently 128-786 - Trulicity 7.67 q. Sunday, resumed Farxiga 10   Squamous cell CA anus stage III - Outpatient follow-up with Dr. Delton Coombes  HFpEF - Lasix 20 held given slightly rising creatinine, resume as OP  Left lower extremity DVT in the past - Eliquis 5 twice resumed on 2/1  Anxiety/depression - has been on meds for a long time - Continue Xanax 0.5 at bedtime, sertraline 100 daily, trazodone daily - Attempt to de-escalate multiple anxiolytics as an outpatient if possible  Accidental injury to left hand 1970s - Will need skilled secondary to deficits on left side (recent surgery, past amputation of left hand)  DVT prophylaxis: SCD at this time Code  Status: DNR Family Communication: Patient's son brandon [phone] updated 2/4 Disposition:  Status is: Inpatient Remains inpatient appropriate because:   Require skilled placement on discharge likely on 06/22/2022 earliest    Subjective:  Persistent diarrhea 2/3 seems resolved Seems to be in good spirits not anxious Pain is controlled 3/10 with straight leg raise of the left lower extremity She has no other real complaints    Objective: Vitals:   06/20/22 1553 06/20/22 1922 06/21/22 0340 06/21/22 0709  BP: (!) 142/64 132/69 (!) 135/52 127/88  Pulse: 91 87 89 80  Resp: '18 18 18 18  '$ Temp: 98.4 F (36.9 C) 98.7 F (37.1 C) 99.1 F (37.3 C) 98.4 F (36.9 C)  TempSrc: Oral Oral Oral Oral  SpO2: 96% 98% 96% 94%  Weight:      Height:        Intake/Output Summary (Last 24 hours) at 06/21/2022 0943 Last data filed at 06/21/2022 2094 Gross per 24 hour  Intake --  Output 2175 ml  Net -2175 ml    Filed Weights   06/14/22 1719  Weight: 81.2 kg    Examination:  Pleasant seems quite hard of hearing Coherent S1-S2 no murmur Abdomen is soft no rebound she has Foley still in place Wounds examined on left lateral thigh-bandages in place-straight leg raises much less antalgic than yesterday Oriented x 3 Psych anxious  Data Reviewed: personally reviewed   CBC    Component Value Date/Time   WBC 5.9 06/18/2022 0437   RBC 3.94 06/18/2022 0437   HGB 11.6 (L) 06/18/2022 0437   HCT 34.7 (L) 06/18/2022 0437   PLT 216 06/18/2022  0437   MCV 88.1 06/18/2022 0437   MCH 29.4 06/18/2022 0437   MCHC 33.4 06/18/2022 0437   RDW 13.6 06/18/2022 0437   LYMPHSABS 1.0 06/14/2022 1730   MONOABS 0.5 06/14/2022 1730   EOSABS 0.1 06/14/2022 1730   BASOSABS 0.0 06/14/2022 1730      Latest Ref Rng & Units 06/21/2022    2:23 AM 06/20/2022    2:29 AM 06/19/2022    5:49 AM  CMP  Glucose 70 - 99 mg/dL 121  173  209   BUN 8 - 23 mg/dL '14  24  28   '$ Creatinine 0.44 - 1.00 mg/dL 0.60  0.61  0.74    Sodium 135 - 145 mmol/L 137  138  135   Potassium 3.5 - 5.1 mmol/L 3.9  4.0  3.9   Chloride 98 - 111 mmol/L 99  102  100   CO2 22 - 32 mmol/L '29  29  27   '$ Calcium 8.9 - 10.3 mg/dL 8.4  8.6  8.4    Radiology Studies: No results found.  Scheduled Meds:  ALPRAZolam  0.5 mg Oral QHS   apixaban  5 mg Oral BID   atorvastatin  80 mg Oral QPM   Chlorhexidine Gluconate Cloth  6 each Topical Daily   dapagliflozin propanediol  10 mg Oral Daily   Dulaglutide  0.75 mg Intramuscular Q Sun   gabapentin  100 mg Oral QHS   gabapentin  200 mg Oral Daily   insulin aspart  0-9 Units Subcutaneous TID WC   insulin aspart  3 Units Subcutaneous TID WC   insulin detemir  15 Units Subcutaneous QHS   mupirocin ointment  1 Application Nasal BID   ondansetron  4 mg Intravenous Once   sertraline  100 mg Oral Daily   traZODone  100 mg Oral QHS   Continuous Infusions:  methocarbamol (ROBAXIN) IV     tranexamic acid       LOS: 7 days   Time spent: 74  Nita Sells, MD [facility] To contact the attending provider between 7A-7P or the covering provider during after hours 7P-7A, please log into the web site [email or web address] and access using universal [place] password for that web site. If you do not have the password, please call the hospital operator.  06/21/2022, 9:43 AM

## 2022-06-21 NOTE — Progress Notes (Signed)
Foley clamped for 1 hour and unclamped, Patient has no sensation to void. Continue foley in per MD order.

## 2022-06-22 DIAGNOSIS — S72142A Displaced intertrochanteric fracture of left femur, initial encounter for closed fracture: Secondary | ICD-10-CM | POA: Diagnosis not present

## 2022-06-22 LAB — BASIC METABOLIC PANEL
Anion gap: 7 (ref 5–15)
BUN: 19 mg/dL (ref 8–23)
CO2: 29 mmol/L (ref 22–32)
Calcium: 8.4 mg/dL — ABNORMAL LOW (ref 8.9–10.3)
Chloride: 99 mmol/L (ref 98–111)
Creatinine, Ser: 0.63 mg/dL (ref 0.44–1.00)
GFR, Estimated: >60 mL/min (ref >60)
Glucose, Bld: 176 mg/dL — ABNORMAL HIGH (ref 70–99)
Potassium: 3.6 mmol/L (ref 3.5–5.1)
Sodium: 135 mmol/L (ref 135–145)

## 2022-06-22 LAB — CBC
HCT: 32.1 % — ABNORMAL LOW (ref 36.0–46.0)
Hemoglobin: 10.7 g/dL — ABNORMAL LOW (ref 12.0–15.0)
MCH: 30.1 pg (ref 26.0–34.0)
MCHC: 33.3 g/dL (ref 30.0–36.0)
MCV: 90.2 fL (ref 80.0–100.0)
Platelets: 278 10*3/uL (ref 150–400)
RBC: 3.56 MIL/uL — ABNORMAL LOW (ref 3.87–5.11)
RDW: 13.8 % (ref 11.5–15.5)
WBC: 7.4 10*3/uL (ref 4.0–10.5)
nRBC: 0 % (ref 0.0–0.2)

## 2022-06-22 LAB — GLUCOSE, CAPILLARY
Glucose-Capillary: 119 mg/dL — ABNORMAL HIGH (ref 70–99)
Glucose-Capillary: 161 mg/dL — ABNORMAL HIGH (ref 70–99)
Glucose-Capillary: 131 mg/dL — ABNORMAL HIGH (ref 70–99)
Glucose-Capillary: 136 mg/dL — ABNORMAL HIGH (ref 70–99)

## 2022-06-22 MED ORDER — DIPHENOXYLATE-ATROPINE 2.5-0.025 MG PO TABS
1.0000 | ORAL_TABLET | Freq: Four times a day (QID) | ORAL | Status: AC
  Administered 2022-06-22 (×3): 1 via ORAL
  Filled 2022-06-22 (×3): qty 1

## 2022-06-22 NOTE — Progress Notes (Signed)
Physical Therapy Treatment Patient Details Name: Jacqueline Bird MRN: [id number] DOB: 03-21-1951 Today's Date: 06/22/2022   History of Present Illness 72 yo female admitted 1/28 after fall at home with left femur fx. S/p Lt femur ORIF 1/31. 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 Pt able to move LLE with greater ease and utilize platform RW to begin to take steps this session. Pt educated for mobility, gait and HEP with encouragement to continue to progress functional mobility.     Recommendations for follow up therapy are one component of a multi-disciplinary discharge planning process, led by the attending physician.  Recommendations may be updated based on patient status, additional functional criteria and insurance authorization.  Follow Up Recommendations  Skilled nursing-short term rehab (<3 hours/day) Can patient physically be transported by private vehicle: No   Assistance Recommended at Discharge Frequent or constant Supervision/Assistance  Patient can return home with the following Two people to help with walking and/or transfers;A lot of help with bathing/dressing/bathroom;Assistance with cooking/housework;Assist for transportation;Help with stairs or ramp for entrance   Equipment Recommendations  Wheelchair (measurements PT);BSC/3in1;Other (comment) (left platform RW)    Recommendations for Other Services       Precautions / Restrictions Precautions Precautions: Fall;Other (comment) Precaution Comments: 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) Restrictions LLE Weight Bearing: Weight bearing as tolerated     Mobility  Bed Mobility Overal bed mobility: Needs Assistance Bed Mobility: Supine to Sit     Supine to sit: Mod assist, HOB elevated     General bed mobility comments: HOB 20 degrees, hooked LLE with RLE with mod assist to  pivot to right side with rail, pad and increased time    Transfers Overall transfer level: Needs assistance   Transfers: Sit to/from Stand, Bed to chair/wheelchair/BSC Sit to Stand: Mod assist, +2 physical assistance Stand pivot transfers: Mod assist, +2 physical assistance         General transfer comment: mod +2 with bil UE hooked and use of belt and pad to support sacrum to stand from bed x 2 and pivot to [facility]. From [facility] min assist to rise with cues for hand placement off bSC and onto platform RW.    Ambulation/Gait Ambulation/Gait assistance: Min assist, +2 safety/equipment Gait Distance (Feet): 4 Feet Assistive device: Left platform walker Gait Pattern/deviations: Step-to pattern, Wide base of support, Trunk flexed   Gait velocity interpretation: <1.31 ft/sec, indicative of household ambulator   General Gait Details: increased time with mod cues for weight shift and stepping with reliance on platform RW, decreased stance LLE and limited stride   Stairs             Wheelchair Mobility    Modified Rankin (Stroke Patients Only)       Balance Overall balance assessment: Needs assistance   Sitting balance-Leahy Scale: Fair Sitting balance - Comments: EOB with UE support   Standing balance support: Bilateral upper extremity supported Standing balance-Leahy Scale: Poor Standing balance comment: bil UE hooked in standing and reliant on +2 assist or RW                            Cognition Arousal/Alertness: Awake/alert Behavior During Therapy: Anxious, Flat affect Overall Cognitive Status: Impaired/Different from baseline Area of Impairment: Memory, Following commands, Safety/judgement  Orientation Level: Time, Disoriented to   Memory: Decreased short-term memory Following Commands: Follows one step commands consistently, Follows one step commands with increased time Safety/Judgement: Decreased awareness of deficits   Problem  Solving: Slow processing, Requires verbal cues, Requires tactile cues General Comments: pt with continued anxiety with movement but improved from prior sessions with improved processing time        Exercises General Exercises - Lower Extremity Long Arc Quad: AROM, Left, Seated, 10 reps Heel Slides: AAROM, Left, Supine, 10 reps Hip ABduction/ADduction: AAROM, Left, Supine, 10 reps    General Comments        Pertinent Vitals/Pain Pain Assessment Pain Score: 3  Pain Location: L LE Pain Descriptors / Indicators: Aching, Grimacing Pain Intervention(s): Limited activity within patient's tolerance, Repositioned, Monitored during session, Premedicated before session    Home Living                          Prior Function            PT Goals (current goals can now be found in the care plan section) Progress towards PT goals: Progressing toward goals    Frequency    Min 3X/week      PT Plan Current plan remains appropriate;Frequency needs to be updated    Co-evaluation              AM-PAC PT "6 Clicks" Mobility   Outcome Measure  Help needed turning from your back to your side while in a flat bed without using bedrails?: A Lot Help needed moving from lying on your back to sitting on the side of a flat bed without using bedrails?: A Lot Help needed moving to and from a bed to a chair (including a wheelchair)?: Total Help needed standing up from a chair using your arms (e.g., wheelchair or bedside chair)?: Total Help needed to walk in hospital room?: Total Help needed climbing 3-5 steps with a railing? : Total 6 Click Score: 8    End of Session Equipment Utilized During Treatment: Gait belt Activity Tolerance: Patient tolerated treatment well Patient left: in chair;with call bell/phone within reach;with chair alarm set Nurse Communication: Mobility status;Precautions PT Visit Diagnosis: Other abnormalities of gait and mobility (R26.89);History of falling  (Z91.81);Muscle weakness (generalized) (M62.81)     Time: 8676-1950 PT Time Calculation (min) (ACUTE ONLY): 29 min  Charges:  $Therapeutic Exercise: 8-22 mins $Therapeutic Activity: 8-22 mins                     Bayard Males, PT Acute Rehabilitation Services Office: [phone]    Lamarr Lulas 06/22/2022, 9:43 AM

## 2022-06-22 NOTE — TOC Progression Note (Addendum)
Transition of Care [facility]) - Progression Note    Patient Details  Name: Jacqueline Bird MRN: [id number] Date of Birth: Sep 16, 1950  Transition of Care [facility]) CM/SW [place], RN Phone Number: 06/22/2022, 2:34 PM  Clinical Narrative:    CM called and left a message with Ebony Hail, CM at [facility] to check on insurance authorization - still pending at this time.  06/22/2021 1557 - CM called and spoke with Ebony Hail, Admission director with [facility] and Rehabilitation and the facility received insurance authorization.  Attending MD, Dr. Verlon Au states that the patient has been having frequent loose stools and will be medically stable for discharge tomorrow.  The patient was updated and she states that she will call her family - Corey Harold will be arranged for transport tomorrow.   Expected Discharge Plan: Skilled Nursing Facility Barriers to Discharge: Continued Medical Work up  Expected Discharge Plan and Services                                               Social Determinants of Health (SDOH) Interventions SDOH Screenings   Food Insecurity: No Food Insecurity (06/16/2022)  Housing: Low Risk  (06/16/2022)  Transportation Needs: No Transportation Needs (06/16/2022)  Utilities: Not At Risk (06/16/2022)  Depression (PHQ2-9): High Risk (02/05/2022)  Financial Resource Strain: Medium Risk (02/06/2019)  Physical Activity: Sufficiently Active (02/06/2019)  Social Connections: Moderately Integrated (02/06/2019)  Stress: Stress Concern Present (02/06/2019)  Tobacco Use: Low Risk  (06/19/2022)    Readmission Risk Interventions     No data to display

## 2022-06-22 NOTE — Progress Notes (Signed)
PROGRESS NOTE   Jacqueline Bird  JAS:[id number] DOB: 11-Jan-1951 DOA: 06/14/2022 PCP: Celene Squibb, MD  Brief Narrative:   72 year old white female DM TY 2 HTN HLD  squamous cell CA anus stage IIIc in remission-follows with Dr. Delton Coombes oncology LLE DVT on apixaban HFpEF admission 1/3 through 05/27/2019   s/p accidental mechanical fall 06/14/2022 Presented to [facility] ED 1/30-arrived at [facility] after unavoidable delay in transfer to [facility], orthopedics consulted 1/31 ORIF intramedullary implant Dr. Erlinda Hong  Hospital-Problem based course  Comminuted severe fracture of left hip, s/p surgery - WBAT, on Eliquis  5 bid--rest per orthopedics -Continue Tylenol 1000 every 6, oxycodone 5-10 for moderate pain and 10115 for severe, stop IV dilaudid  - Physical therapy recommends skilled care  AKI resulting from Acute Urinary retention -foley in/out 2/1-retaining ~ 500 cc 2/2 - AKI has resolved - Foley indwelling 2/2 placed--failed voiding trial 06/20/22 - need outpatient evaluation by urology arranged by skilled physician for voiding trial  Constipation resolved - still having soft BM - give Lomotil x 3 doses and stop  DM TY 2 -Insulins adjusted + mealtime 3 units, Levemir now 15 units at bedtime with hyperglycemia this hospitalization - CBGs currently 419-379 - Trulicity 0.24 q. Sunday, resumed Farxiga 10   Squamous cell CA anus stage III - Outpatient follow-up with Dr. Delton Coombes  HFpEF - Lasix 20 held given slightly rising creatinine, resume as OP  Left lower extremity DVT in the past - Eliquis 5 twice resumed on 2/1  Anxiety/depression - has been on meds for a long time - Continue Xanax 0.5 at bedtime, sertraline 100 daily, trazodone daily - Attempt to de-escalate multiple anxiolytics as an outpatient if possible  Accidental injury to left hand 1970s - Will need skilled secondary to deficits on left side (recent surgery, past amputation of left hand)  DVT prophylaxis: SCD at this  time Code Status: DNR Family Communication: Patient's son brandon [phone] updated 2/4 Disposition:  Status is: Inpatient Remains inpatient appropriate because:   Require skilled placement on discharge likely on 06/22/2022 earliest    Subjective:  Sitting in chair today and worked some with PT -had unformed stool-she reports 1 episode this am as Dark Hemoglobin 10.  Close to prior No CP fever chills n/v  Objective: Vitals:   06/21/22 1629 06/21/22 2200 06/22/22 0600 06/22/22 0855  BP: (!) 120/50 105/68 123/61 (!) 123/95  Pulse: 86 74 70 85  Resp: '18 16 16 17  '$ Temp: 98.2 F (36.8 C) 99 F (37.2 C) 98.3 F (36.8 C) 97.9 F (36.6 C)  TempSrc: Oral Oral Oral Oral  SpO2: 97% 98% 97% 95%  Weight:      Height:        Intake/Output Summary (Last 24 hours) at 06/22/2022 1312 Last data filed at 06/22/2022 0900 Gross per 24 hour  Intake 540 ml  Output 1901 ml  Net -1361 ml    Filed Weights   06/14/22 1719  Weight: 81.2 kg    Examination:  Coherent eomi ncat no focal deficit, HOH S1-S2 no murmur Abdomen is soft no rebound --Foley still in place Wounds examined on left lateral thigh-bandages in place-SLR less antalgic  Oriented x 3 Psych anxious  Data Reviewed: personally reviewed   CBC    Component Value Date/Time   WBC 7.4 06/22/2022 0405   RBC 3.56 (L) 06/22/2022 0405   HGB 10.7 (L) 06/22/2022 0405   HCT 32.1 (L) 06/22/2022 0405   PLT 278 06/22/2022 0405   MCV 90.2  06/22/2022 0405   MCH 30.1 06/22/2022 0405   MCHC 33.3 06/22/2022 0405   RDW 13.8 06/22/2022 0405   LYMPHSABS 1.0 06/14/2022 1730   MONOABS 0.5 06/14/2022 1730   EOSABS 0.1 06/14/2022 1730   BASOSABS 0.0 06/14/2022 1730      Latest Ref Rng & Units 06/22/2022    4:05 AM 06/21/2022    2:23 AM 06/20/2022    2:29 AM  CMP  Glucose 70 - 99 mg/dL 176  121  173   BUN 8 - 23 mg/dL '19  14  24   '$ Creatinine 0.44 - 1.00 mg/dL 0.63  0.60  0.61   Sodium 135 - 145 mmol/L 135  137  138   Potassium 3.5 -  5.1 mmol/L 3.6  3.9  4.0   Chloride 98 - 111 mmol/L 99  99  102   CO2 22 - 32 mmol/L '29  29  29   '$ Calcium 8.9 - 10.3 mg/dL 8.4  8.4  8.6    Radiology Studies: No results found.  Scheduled Meds:  ALPRAZolam  0.5 mg Oral QHS   apixaban  5 mg Oral BID   atorvastatin  80 mg Oral QPM   Chlorhexidine Gluconate Cloth  6 each Topical Daily   dapagliflozin propanediol  10 mg Oral Daily   diphenoxylate-atropine  1 tablet Oral QID   Dulaglutide  0.75 mg Intramuscular Q Sun   gabapentin  100 mg Oral QHS   gabapentin  200 mg Oral Daily   insulin aspart  0-9 Units Subcutaneous TID WC   insulin aspart  3 Units Subcutaneous TID WC   insulin detemir  15 Units Subcutaneous QHS   sertraline  100 mg Oral Daily   traZODone  100 mg Oral QHS   Continuous Infusions:  methocarbamol (ROBAXIN) IV       LOS: 8 days   Time spent: 76  Nita Sells, MD [facility] To contact the attending provider between 7A-7P or the covering provider during after hours 7P-7A, please log into the web site [email or web address] and access using universal [place] password for that web site. If you do not have the password, please call the hospital operator.  06/22/2022, 1:12 PM

## 2022-06-22 NOTE — Plan of Care (Signed)

## 2022-06-23 DIAGNOSIS — W19XXXA Unspecified fall, initial encounter: Secondary | ICD-10-CM | POA: Diagnosis not present

## 2022-06-23 DIAGNOSIS — S68412S Complete traumatic amputation of left hand at wrist level, sequela: Secondary | ICD-10-CM | POA: Diagnosis not present

## 2022-06-23 DIAGNOSIS — S7292XA Unspecified fracture of left femur, initial encounter for closed fracture: Secondary | ICD-10-CM | POA: Diagnosis not present

## 2022-06-23 DIAGNOSIS — I10 Essential (primary) hypertension: Secondary | ICD-10-CM | POA: Diagnosis not present

## 2022-06-23 DIAGNOSIS — F329 Major depressive disorder, single episode, unspecified: Secondary | ICD-10-CM | POA: Diagnosis not present

## 2022-06-23 DIAGNOSIS — E785 Hyperlipidemia, unspecified: Secondary | ICD-10-CM | POA: Diagnosis not present

## 2022-06-23 DIAGNOSIS — C210 Malignant neoplasm of anus, unspecified: Secondary | ICD-10-CM | POA: Diagnosis not present

## 2022-06-23 DIAGNOSIS — F411 Generalized anxiety disorder: Secondary | ICD-10-CM | POA: Diagnosis not present

## 2022-06-23 DIAGNOSIS — E1140 Type 2 diabetes mellitus with diabetic neuropathy, unspecified: Secondary | ICD-10-CM | POA: Diagnosis not present

## 2022-06-23 DIAGNOSIS — R531 Weakness: Secondary | ICD-10-CM | POA: Diagnosis not present

## 2022-06-23 DIAGNOSIS — R339 Retention of urine, unspecified: Secondary | ICD-10-CM | POA: Diagnosis not present

## 2022-06-23 DIAGNOSIS — S72142A Displaced intertrochanteric fracture of left femur, initial encounter for closed fracture: Secondary | ICD-10-CM | POA: Diagnosis not present

## 2022-06-23 DIAGNOSIS — Z4789 Encounter for other orthopedic aftercare: Secondary | ICD-10-CM | POA: Diagnosis not present

## 2022-06-23 DIAGNOSIS — Z743 Need for continuous supervision: Secondary | ICD-10-CM | POA: Diagnosis not present

## 2022-06-23 DIAGNOSIS — F339 Major depressive disorder, recurrent, unspecified: Secondary | ICD-10-CM | POA: Diagnosis not present

## 2022-06-23 DIAGNOSIS — R69 Illness, unspecified: Secondary | ICD-10-CM | POA: Diagnosis not present

## 2022-06-23 DIAGNOSIS — I82402 Acute embolism and thrombosis of unspecified deep veins of left lower extremity: Secondary | ICD-10-CM | POA: Diagnosis not present

## 2022-06-23 DIAGNOSIS — R4181 Age-related cognitive decline: Secondary | ICD-10-CM | POA: Diagnosis not present

## 2022-06-23 DIAGNOSIS — E119 Type 2 diabetes mellitus without complications: Secondary | ICD-10-CM | POA: Diagnosis not present

## 2022-06-23 DIAGNOSIS — Z7401 Bed confinement status: Secondary | ICD-10-CM | POA: Diagnosis not present

## 2022-06-23 DIAGNOSIS — S72002D Fracture of unspecified part of neck of left femur, subsequent encounter for closed fracture with routine healing: Secondary | ICD-10-CM | POA: Diagnosis not present

## 2022-06-23 DIAGNOSIS — G894 Chronic pain syndrome: Secondary | ICD-10-CM | POA: Diagnosis not present

## 2022-06-23 DIAGNOSIS — R279 Unspecified lack of coordination: Secondary | ICD-10-CM | POA: Diagnosis not present

## 2022-06-23 DIAGNOSIS — Z89112 Acquired absence of left hand: Secondary | ICD-10-CM | POA: Diagnosis not present

## 2022-06-23 DIAGNOSIS — M6281 Muscle weakness (generalized): Secondary | ICD-10-CM | POA: Diagnosis not present

## 2022-06-23 DIAGNOSIS — H9190 Unspecified hearing loss, unspecified ear: Secondary | ICD-10-CM | POA: Diagnosis not present

## 2022-06-23 DIAGNOSIS — R5381 Other malaise: Secondary | ICD-10-CM | POA: Diagnosis not present

## 2022-06-23 DIAGNOSIS — M25552 Pain in left hip: Secondary | ICD-10-CM | POA: Diagnosis not present

## 2022-06-23 DIAGNOSIS — I5030 Unspecified diastolic (congestive) heart failure: Secondary | ICD-10-CM | POA: Diagnosis not present

## 2022-06-23 DIAGNOSIS — I82502 Chronic embolism and thrombosis of unspecified deep veins of left lower extremity: Secondary | ICD-10-CM | POA: Diagnosis not present

## 2022-06-23 DIAGNOSIS — Z794 Long term (current) use of insulin: Secondary | ICD-10-CM | POA: Diagnosis not present

## 2022-06-23 LAB — GLUCOSE, CAPILLARY
Glucose-Capillary: 104 mg/dL — ABNORMAL HIGH (ref 70–99)
Glucose-Capillary: 166 mg/dL — ABNORMAL HIGH (ref 70–99)

## 2022-06-23 MED ORDER — SERTRALINE HCL 100 MG PO TABS: 100.0000 mg | ORAL_TABLET | Freq: Every day | ORAL | 0 refills | Status: AC

## 2022-06-23 MED ORDER — OXYCODONE HCL 5 MG PO TABS: 5.0000 mg | ORAL_TABLET | Freq: Four times a day (QID) | ORAL | 0 refills | Status: DC | PRN

## 2022-06-23 MED ORDER — ELIQUIS 5 MG PO TABS: 5.0000 mg | ORAL_TABLET | Freq: Two times a day (BID) | ORAL | 0 refills | Status: DC

## 2022-06-23 MED ORDER — GABAPENTIN 100 MG PO CAPS: 100.0000 mg | ORAL_CAPSULE | ORAL | 0 refills | Status: AC

## 2022-06-23 MED ORDER — ALPRAZOLAM 0.5 MG PO TABS: 0.5000 mg | ORAL_TABLET | Freq: Every day | ORAL | 0 refills | Status: DC

## 2022-06-23 MED ORDER — METHOCARBAMOL 500 MG PO TABS: 500.0000 mg | ORAL_TABLET | Freq: Four times a day (QID) | ORAL | 0 refills | Status: DC | PRN

## 2022-06-23 MED ORDER — INSULIN DETEMIR 100 UNIT/ML ~~LOC~~ SOLN: 15.0000 [IU] | Freq: Every day | SUBCUTANEOUS | 11 refills | Status: DC

## 2022-06-23 MED ORDER — FARXIGA 10 MG PO TABS: 10.0000 mg | ORAL_TABLET | Freq: Every day | ORAL | 0 refills | Status: AC

## 2022-06-23 MED ORDER — TRAZODONE HCL 50 MG PO TABS: 100.0000 mg | ORAL_TABLET | Freq: Every day | ORAL | 0 refills | Status: AC

## 2022-06-23 NOTE — Plan of Care (Signed)
1340 Patient AxOx4, ready to transfer to Rehab. She was explained next steps in her plan of care and states that has no other questions. I called report to [facility] and talked with receiving RN, Hilma Favors. I explain that patient will arrive with a foley and a trial needs to be done on 2/12. Patient has hard written prescriptions for medication that were given to the EMT transport.

## 2022-06-23 NOTE — TOC Transition Note (Signed)
Transition of Care [facility]) - CM/SW Discharge Note   Patient Details  Name: Jacqueline Bird MRN: [id number] Date of Birth: Feb 12, 1951  Transition of Care [facility]) CM/SW Contact:  Curlene Labrum, RN Phone Number: 06/23/2022, 11:32 AM   Clinical Narrative:    CM spoke with Dr. Verlon Au, attending physician and patient is able to discharge to [facility] and Rehabilitation today.  Discharge summary and transfer report was uploaded in the hub this morning.  [facility] is arranged for transport to the facility.  Bedside nursing - please call report to [facility] Rehabilitation at [phone].   Final next level of care: Skilled Nursing Facility Barriers to Discharge: Continued Medical Work up   Patient Goals and CMS Choice CMS Medicare.gov Compare Post Acute Care list provided to:: Patient Choice offered to / list presented to : Patient  Discharge Placement                         Discharge Plan and Services Additional resources added to the After Visit Summary for                                       Social Determinants of Health (SDOH) Interventions SDOH Screenings   Food Insecurity: No Food Insecurity (06/16/2022)  Housing: Low Risk  (06/16/2022)  Transportation Needs: No Transportation Needs (06/16/2022)  Utilities: Not At Risk (06/16/2022)  Depression (PHQ2-9): High Risk (02/05/2022)  Financial Resource Strain: Medium Risk (02/06/2019)  Physical Activity: Sufficiently Active (02/06/2019)  Social Connections: Moderately Integrated (02/06/2019)  Stress: Stress Concern Present (02/06/2019)  Tobacco Use: Low Risk  (06/19/2022)     Readmission Risk Interventions     No data to display

## 2022-06-23 NOTE — Discharge Summary (Addendum)
Physician Discharge Summary  Jacqueline Bird UGQ:[id number] DOB: 12-10-1950 DOA: 06/14/2022  PCP: Celene Squibb, MD  Admit date: 06/14/2022 Discharge date: 06/23/2022  Time spent: 40 minutes  Recommendations for Outpatient Follow-up:  WBAT as per orthopedics continue Eliquis 5 twice daily for another 20 days and then  per orthopedics will be CCed on this note to ensure is followed up in the outpatient setting Needs CBC Chem-7 in about 1 week New meds: Eliquis as above-oxycodone hard Rx given -Note-may need to reinitiate Lasix as an outpatient Required is voiding trial as OP--- if unable to void on voiding trial 2/12 would recommend referral to urology from skilled facility as per discretion of skilled facility physician Needs outpatient follow-up with Dr. Delton Coombes regarding squamous cell CA   Discharge Diagnoses:  MAIN problem for hospitalization   Acute hip fracture, AKI resulting from acute urinary retention Diabetes mellitus HFpEF Bipolar  Please see below for itemized issues addressed in [place]- refer to other progress notes for clarity if needed  Discharge Condition: Improved  Diet recommendation: Diabetic heart healthy  Filed Weights   06/14/22 1719  Weight: 31.38 kg    72 year old white female DM TY 2 HTN HLD  squamous cell CA anus stage IIIc in remission-follows with Dr. Delton Coombes oncology LLE DVT on apixaban prior HFpEF admission 1/3 through 05/27/2019    s/p accidental mechanical fall 06/14/2022 Presented to [facility] ED 1/30-arrived at [facility] after unavoidable delay in transfer to [facility], orthopedics consulted 1/31 ORIF intramedullary implant Dr. Erlinda Hong   Hospital-Problem based course   Comminuted severe fracture of left hip, s/p surgery - WBAT, on Eliquis  5 bid--outpatient follow-up Dr. Zollie Beckers of orthopedics-I have CCed him on this note -Continue Tylenol 1000 every 6, oxycodone 5-10 for moderate pain   - Physical therapy recommends skilled care -Eliquis to  continue, prescription given and discontinue when feasible per Ortho   AKI resulting from Acute Urinary retention -foley in/out 2/1-retaining ~ 500 cc 2/2 - AKI has resolved - Foley indwelling 06/19/2022 placed--failed voiding trial 06/20/22 - need outpatient evaluation by urology arranged by skilled physician for voiding trial   Constipation resolved - Situational and resolved-cautious laxatives in the outpatient setting   DM TY 2 -Insulins adjusted + mealtime 3 units, Levemir now 15 units at bedtime with hyperglycemia this hospitalization - CBGs currently 882-800 - Trulicity 3.49 q. Sunday, resumed Farxiga 10    Squamous cell CA anus stage III - Outpatient follow-up with Dr. Delton Coombes   HFpEF - Lasix 20 held given slightly rising creatinine, resume as OP   Left lower extremity DVT in the past - Eliquis 5 twice resumed on 2/1   Anxiety/depression - has been on meds for a long time - Continue Xanax 0.5 at bedtime, sertraline 100 daily, trazodone daily and Rx given - Attempt to de-escalate multiple anxiolytics as an outpatient if possible   Accidental injury to left hand 1970s - Will need skilled secondary to deficits on left side (recent surgery, past amputation of left hand)   Discharge Exam: Vitals:   06/23/22 0511 06/23/22 0722  BP: (!) 110/46 (!) 122/44  Pulse:  73  Resp: 18 16  Temp: 97.7 F (36.5 C) 98.3 F (36.8 C)  SpO2: 97% 96%   Awake coherent no distress, EOMI NCAT no focal deficit Chest is clear no rales no rhonchi no wheeze S1-S2 no murmur no rub no gallop RRR Abdomen is soft no rebound Foley catheter is placed Wounds on left lower extremity seen reasonably well-intentioned  Straight leg raise is less antalgic Left upper extremity has bandage on it which is chronic   Discharge Instructions   Discharge Instructions     Diet - low sodium heart healthy   Complete by: As directed    Increase activity slowly   Complete by: As directed        Allergies as of 06/23/2022       Reactions   Sulfa Antibiotics Anaphylaxis, Swelling   Per pt, can use creams with sulfa in it        Medication List     STOP taking these medications    furosemide 20 MG tablet Commonly known as: LASIX   NovoLOG FlexPen 100 UNIT/ML FlexPen Generic drug: insulin aspart   Tresiba FlexTouch 200 UNIT/ML FlexTouch Pen Generic drug: insulin degludec   Vitamin D 125 MCG (5000 UT) Caps       TAKE these medications    acetaminophen 500 MG tablet Commonly known as: TYLENOL Take 500-1,000 mg by mouth every 6 (six) hours as needed for moderate pain.   ALPRAZolam 0.5 MG tablet Commonly known as: XANAX Take 1 tablet (0.5 mg total) by mouth at bedtime.   atorvastatin 80 MG tablet Commonly known as: LIPITOR Take 80 mg by mouth every evening.   Eliquis 5 MG Tabs tablet Generic drug: apixaban Take 1 tablet (5 mg total) by mouth 2 (two) times daily for 20 days.   Farxiga 10 MG Tabs tablet Generic drug: dapagliflozin propanediol Take 1 tablet (10 mg total) by mouth daily.   gabapentin 100 MG capsule Commonly known as: NEURONTIN Take 1-2 capsules (100-200 mg total) by mouth See admin instructions. Take 200 mg by mouth in the morning and take 100 mg at night.   insulin detemir 100 UNIT/ML injection Commonly known as: LEVEMIR Inject 0.15 mLs (15 Units total) into the skin at bedtime.   methocarbamol 500 MG tablet Commonly known as: ROBAXIN Take 1 tablet (500 mg total) by mouth every 6 (six) hours as needed for muscle spasms.   oxyCODONE 5 MG immediate release tablet Commonly known as: Oxy IR/ROXICODONE Take 1-2 tablets (5-10 mg total) by mouth every 6 (six) hours as needed for moderate pain (pain score 4-6).   sertraline 100 MG tablet Commonly known as: ZOLOFT Take 1 tablet (100 mg total) by mouth daily.   traZODone 50 MG tablet Commonly known as: DESYREL Take 2 tablets (100 mg total) by mouth at bedtime.   Trulicity 5.85 ID/7.8EU  Sopn Generic drug: Dulaglutide Inject 0.75 mg into the muscle every Sunday.       Allergies  Allergen Reactions   Sulfa Antibiotics Anaphylaxis and Swelling    Per pt, can use creams with sulfa in it    Follow-up Information     Leandrew Koyanagi, MD. Schedule an appointment as soon as possible for a visit in 2 week(s).   Specialty: Orthopedic Surgery Contact information: [address] [place] [address] [phone]                  The results of significant diagnostics from this hospitalization (including imaging, microbiology, ancillary and laboratory) are listed below for reference.    Significant Diagnostic Studies: Pelvis Portable  Result Date: 06/17/2022 CLINICAL DATA:  History of left hip surgery. EXAM: PORTABLE PELVIS 1-2 VIEWS COMPARISON:  Pelvis and left hip radiographs 06/14/2022 FINDINGS: Interval long femoral intramedullary nail fixation the previously seen displaced and comminuted intertrochanteric fracture of the proximal femur. Improved alignment. No evidence of hardware  failure. Mild lateral displacement of the cortex of the distal aspect of the greater trochanter fracture fragment and mild medial displacement of the lesser trochanter fracture fragment. Mild-to-moderate bilateral superomedial femoroacetabular joint space narrowing. At high-grade vascular calcifications. Proximal distal lateral thigh and lateral pelvis surgical skin staples. Lateral left thigh postoperative subcutaneous air. IMPRESSION: Interval long femoral intramedullary nail fixation the previously seen displaced and comminuted intertrochanteric fracture of the proximal femur. Improved alignment. Electronically Signed   By: Yvonne Kendall M.D.   On: 06/17/2022 18:01   DG HIP UNILAT WITH PELVIS 2-3 VIEWS LEFT  Result Date: 06/17/2022 CLINICAL DATA:  [id number] Elective surgery [id number] EXAM: DG HIP (WITH OR WITHOUT PELVIS) 2-3V LEFT COMPARISON:  CT 06/14/2022 FINDINGS: Intraoperative images  during intramedullary nailing for intertrochanteric fracture of the proximal left femur. Intact hardware. Improved fracture alignment. IMPRESSION: Intraoperative images during left proximal femur ORIF with intramedullary nail. Improved fracture alignment. No evidence of immediate complication. Electronically Signed   By: Maurine Simmering M.D.   On: 06/17/2022 15:36   DG C-Arm 1-60 Min-No Report  Result Date: 06/17/2022 Fluoroscopy was utilized by the requesting physician.  No radiographic interpretation.   CT Hip Left Wo Contrast  Result Date: 06/14/2022 CLINICAL DATA:  Known proximal left femoral fracture EXAM: CT OF THE LEFT HIP WITHOUT CONTRAST TECHNIQUE: Multidetector CT imaging of the left hip was performed according to the standard protocol. Multiplanar CT image reconstructions were also generated. RADIATION DOSE REDUCTION: This exam was performed according to the departmental dose-optimization program which includes automated exposure control, adjustment of the mA and/or kV according to patient size and/or use of iterative reconstruction technique. COMPARISON:  Plain film from earlier in the same day, CT from 02/03/2022 FINDINGS: Bones/Joint/Cartilage There is again noted a comminuted intratrochanteric fracture of the proximal left femur with impaction and mild angulation at the fracture site. The femoral head is well seated. No cortical abnormality is identified to suggest pathologic fracture. Increased soft tissue density is noted within the fracture site consistent with focal hemorrhage. There are changes consistent with prior insufficiency fracture of the sacrum. No other focal abnormality is noted. Ligaments Suboptimally assessed by CT. Muscles and Tendons Surrounding musculature appears within normal limits. Soft tissues Surrounding soft tissue structures demonstrate diffuse vascular calcifications. The pelvic structures appear within normal limits. IMPRESSION: Comminuted intratrochanteric fracture  of the proximal left femur. No lytic or sclerotic foci are identified to suggest a pathologic fracture. Changes of prior insufficiency fracture in the left sacrum No other focal abnormality is noted. Electronically Signed   By: Inez Catalina M.D.   On: 06/14/2022 21:12   DG Chest 1 View  Result Date: 06/14/2022 CLINICAL DATA:  Fall. EXAM: CHEST  1 VIEW COMPARISON:  Oct 16, 2019. FINDINGS: The heart size and mediastinal contours are within normal limits. Both lungs are clear. Left internal jugular catheter is noted with distal tip in expected position of SVC. The visualized skeletal structures are unremarkable. IMPRESSION: No active disease. Electronically Signed   By: Marijo Conception M.D.   On: 06/14/2022 18:54   DG Hip Unilat With Pelvis 2-3 Views Left  Result Date: 06/14/2022 CLINICAL DATA:  Fall. EXAM: DG HIP (WITH OR WITHOUT PELVIS) 2-3V LEFT COMPARISON:  None Available. FINDINGS: Severely comminuted and displaced fracture is seen involving the intertrochanteric region of the proximal left femur. IMPRESSION: Severely comminuted and displaced intertrochanteric fracture of proximal left femur. Electronically Signed   By: Marijo Conception M.D.   On: 06/14/2022 18:53  Microbiology: Recent Results (from the past 240 hour(s))  Surgical PCR screen     Status: None   Collection Time: 06/17/22  4:27 AM   Specimen: Nasal Mucosa; Nasal Swab  Result Value Ref Range Status   MRSA, PCR NEGATIVE NEGATIVE Final   Staphylococcus aureus NEGATIVE NEGATIVE Final    Comment: (NOTE) The Xpert SA Assay (FDA approved for NASAL specimens in patients 90 years of age and older), is one component of a comprehensive surveillance program. It is not intended to diagnose infection nor to guide or monitor treatment. Performed at [facility], [place] [address], [place], [place] [address]      Labs: Basic Metabolic Panel: Recent Labs  Lab 06/18/22 0437 06/19/22 0549 06/20/22 0229 06/21/22 0223 06/22/22 0405   NA 134* 135 138 137 135  K 4.0 3.9 4.0 3.9 3.6  CL 99 100 102 99 99  CO2 '25 27 29 29 29  '$ GLUCOSE 219* 209* 173* 121* 176*  BUN 23 28* 24* 14 19  CREATININE 0.73 0.74 0.61 0.60 0.63  CALCIUM 8.5* 8.4* 8.6* 8.4* 8.4*  PHOS 3.9  --   --   --   --    Liver Function Tests: Recent Labs  Lab 06/18/22 0437  ALBUMIN 2.5*   No results for input(s): "LIPASE", "AMYLASE" in the last 168 hours. No results for input(s): "AMMONIA" in the last 168 hours. CBC: Recent Labs  Lab 06/18/22 0437 06/22/22 0405  WBC 5.9 7.4  HGB 11.6* 10.7*  HCT 34.7* 32.1*  MCV 88.1 90.2  PLT 216 278   Cardiac Enzymes: No results for input(s): "CKTOTAL", "CKMB", "CKMBINDEX", "TROPONINI" in the last 168 hours. BNP: BNP (last 3 results) No results for input(s): "BNP" in the last 8760 hours.  ProBNP (last 3 results) No results for input(s): "PROBNP" in the last 8760 hours.  CBG: Recent Labs  Lab 06/22/22 0734 06/22/22 1100 06/22/22 1638 06/22/22 2101 06/23/22 0743  GLUCAP 119* 161* 131* 136* 104*       Signed:  Nita Sells MD   [facility] 06/23/2022, 11:16 AM

## 2022-06-23 NOTE — Progress Notes (Signed)
Occupational Therapy Treatment Patient Details Name: Jacqueline Bird MRN: [id number] DOB: 1951/05/17 Today's Date: 06/23/2022   History of present illness 72 yo female admitted 1/28 after fall at home with left femur fx. S/p Lt femur ORIF 1/31. 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 Pt remains anxious with WB and mobility attempts though putting forth a good effort. Pt continues to require extensive +2 assist for standing LB ADLs, including posterior hygiene today. Anticipate pt will do well with use of Stedy for transfers w/ nursing staff and will likely feel less anxiety with this device. Continue to rec SNF rehab at DC w/ pt agreeable.    Recommendations for follow up therapy are one component of a multi-disciplinary discharge planning process, led by the attending physician.  Recommendations may be updated based on patient status, additional functional criteria and insurance authorization.    Follow Up Recommendations  Skilled nursing-short term rehab (<3 hours/day)     Assistance Recommended at Discharge Frequent or constant Supervision/Assistance  Patient can return home with the following  Two people to help with walking and/or transfers;Two people to help with bathing/dressing/bathroom   Equipment Recommendations  Other (comment);BSC/3in1 (TBD pending progress)    Recommendations for Other Services      Precautions / Restrictions Precautions Precautions: Fall;Other (comment) Precaution Comments: 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) Restrictions Weight Bearing Restrictions: Yes LLE Weight Bearing: Weight bearing as tolerated       Mobility Bed Mobility Overal bed mobility: Needs Assistance Bed Mobility: Supine to Sit     Supine to sit: Mod assist, HOB elevated     General bed mobility comments: HOB slightly  elevated. use of bedrail, hooking L UE to therapist UE and assist for LLE to EOB    Transfers Overall transfer level: Needs assistance Equipment used: Left platform walker Transfers: Sit to/from Stand, Bed to chair/wheelchair/BSC Sit to Stand: Mod assist, +2 physical assistance Stand pivot transfers: Mod assist, +2 physical assistance         General transfer comment: see BSC transfer for further info     Balance Overall balance assessment: Needs assistance Sitting-balance support: Single extremity supported, No upper extremity supported, Feet supported Sitting balance-Leahy Scale: Fair     Standing balance support: Bilateral upper extremity supported Standing balance-Leahy Scale: Poor                             ADL either performed or assessed with clinical judgement   ADL Overall ADL's : Needs assistance/impaired Eating/Feeding: Set up Eating/Feeding Details (indicate cue type and reason): assist to open canned soda and place butter on bagel d/t chronic L hand amputation                     Toilet Transfer: Moderate assistance;+2 for physical assistance;+2 for safety/equipment;Stand-pivot;BSC/3in1 Toilet Transfer Details (indicate cue type and reason): use of L platform walker, cues for sequencing, balance and pt with difficulty keeping L UE on platform Toileting- Clothing Manipulation and Hygiene: Total assistance;+2 for physical assistance;+2 for safety/equipment;Bed level Toileting - Clothing Manipulation Details (indicate cue type and reason): for hygiene assist in standing + 2nd person for balance       General ADL Comments: Focus on progression of BSC transfers, WB tolerance    Extremity/Trunk  Assessment Upper Extremity Assessment Upper Extremity Assessment: LUE deficits/detail LUE Deficits / Details: 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, from 1970s per chart   Lower Extremity Assessment Lower Extremity Assessment: Defer to PT  evaluation        Vision   Vision Assessment?: No apparent visual deficits   Perception     Praxis      Cognition Arousal/Alertness: Awake/alert Behavior During Therapy: WFL for tasks assessed/performed, Anxious Overall Cognitive Status: Impaired/Different from baseline Area of Impairment: Memory, Following commands, Safety/judgement, Awareness                     Memory: Decreased short-term memory Following Commands: Follows one step commands consistently, Follows one step commands with increased time Safety/Judgement: Decreased awareness of deficits Awareness: Emergent   General Comments: pt with continued anxiety with movement but improved from prior sessions with improved processing time. benefits from sequencing cues throughout, joking during session with improved insights into deficits        Exercises      Shoulder Instructions       General Comments Pt reports sciatic nerve pain and unable to elevate LE in chair. Placed folded blankets under B feet for support with pt reporting comfort    Pertinent Vitals/ Pain       Pain Assessment Pain Assessment: Faces Faces Pain Scale: Hurts little more Pain Location: L LE Pain Descriptors / Indicators: Aching, Grimacing Pain Intervention(s): Monitored during session, RN gave pain meds during session  Home Living                                          Prior Functioning/Environment              Frequency  Min 2X/week        Progress Toward Goals  OT Goals(current goals can now be found in the care plan section)  Progress towards OT goals: Progressing toward goals  Acute Rehab OT Goals Patient Stated Goal: get better soon OT Goal Formulation: With patient/family Time For Goal Achievement: 07/02/22 Potential to Achieve Goals: Good ADL Goals Pt Will Perform Lower Body Bathing: sit to/from stand Pt Will Perform Lower Body Dressing: with min assist;sit to/from stand Pt Will  Transfer to Toilet: with min assist;stand pivot transfer;bedside commode Pt Will Perform Toileting - Clothing Manipulation and hygiene: with min assist;sit to/from stand;sitting/lateral leans  Plan Discharge plan remains appropriate    Co-evaluation                 AM-PAC OT "6 Clicks" Daily Activity     Outcome Measure   Help from another person eating meals?: A Little Help from another person taking care of personal grooming?: A Little Help from another person toileting, which includes using toliet, bedpan, or urinal?: Total Help from another person bathing (including washing, rinsing, drying)?: A Lot Help from another person to put on and taking off regular upper body clothing?: A Little Help from another person to put on and taking off regular lower body clothing?: Total 6 Click Score: 13    End of Session Equipment Utilized During Treatment: Gait belt  OT Visit Diagnosis: Unsteadiness on feet (R26.81);Other abnormalities of gait and mobility (R26.89);Muscle weakness (generalized) (M62.81)   Activity Tolerance Patient tolerated treatment well   Patient Left in chair;with call bell/phone within reach;with chair alarm set   Nurse Communication  Mobility status        Time: 1937-9024 OT Time Calculation (min): 28 min  Charges: OT General Charges $OT Visit: 1 Visit OT Treatments $Self Care/Home Management : 23-37 mins  Malachy Chamber, OTR/L Acute Rehab Services Office: [phone]   Layla Maw 06/23/2022, 10:33 AM

## 2022-06-25 DIAGNOSIS — S72002D Fracture of unspecified part of neck of left femur, subsequent encounter for closed fracture with routine healing: Secondary | ICD-10-CM | POA: Diagnosis not present

## 2022-06-25 DIAGNOSIS — C210 Malignant neoplasm of anus, unspecified: Secondary | ICD-10-CM | POA: Diagnosis not present

## 2022-06-25 DIAGNOSIS — I5030 Unspecified diastolic (congestive) heart failure: Secondary | ICD-10-CM | POA: Diagnosis not present

## 2022-06-25 DIAGNOSIS — I82402 Acute embolism and thrombosis of unspecified deep veins of left lower extremity: Secondary | ICD-10-CM | POA: Diagnosis not present

## 2022-06-25 DIAGNOSIS — E119 Type 2 diabetes mellitus without complications: Secondary | ICD-10-CM | POA: Diagnosis not present

## 2022-06-25 DIAGNOSIS — Z794 Long term (current) use of insulin: Secondary | ICD-10-CM | POA: Diagnosis not present

## 2022-06-25 DIAGNOSIS — H9190 Unspecified hearing loss, unspecified ear: Secondary | ICD-10-CM | POA: Diagnosis not present

## 2022-06-25 DIAGNOSIS — E785 Hyperlipidemia, unspecified: Secondary | ICD-10-CM | POA: Diagnosis not present

## 2022-06-25 DIAGNOSIS — I10 Essential (primary) hypertension: Secondary | ICD-10-CM | POA: Diagnosis not present

## 2022-06-25 DIAGNOSIS — R339 Retention of urine, unspecified: Secondary | ICD-10-CM | POA: Diagnosis not present

## 2022-06-25 DIAGNOSIS — G894 Chronic pain syndrome: Secondary | ICD-10-CM | POA: Diagnosis not present

## 2022-06-25 DIAGNOSIS — S68412S Complete traumatic amputation of left hand at wrist level, sequela: Secondary | ICD-10-CM | POA: Diagnosis not present

## 2022-06-30 DIAGNOSIS — C210 Malignant neoplasm of anus, unspecified: Secondary | ICD-10-CM | POA: Diagnosis not present

## 2022-06-30 DIAGNOSIS — R5381 Other malaise: Secondary | ICD-10-CM | POA: Diagnosis not present

## 2022-07-01 ENCOUNTER — Ambulatory Visit (INDEPENDENT_AMBULATORY_CARE_PROVIDER_SITE_OTHER): Payer: Medicare HMO | Admitting: Physician Assistant

## 2022-07-01 ENCOUNTER — Ambulatory Visit (INDEPENDENT_AMBULATORY_CARE_PROVIDER_SITE_OTHER): Payer: Medicare HMO

## 2022-07-01 ENCOUNTER — Ambulatory Visit: Payer: Self-pay

## 2022-07-01 DIAGNOSIS — F339 Major depressive disorder, recurrent, unspecified: Secondary | ICD-10-CM | POA: Diagnosis not present

## 2022-07-01 DIAGNOSIS — M25552 Pain in left hip: Secondary | ICD-10-CM

## 2022-07-01 DIAGNOSIS — R69 Illness, unspecified: Secondary | ICD-10-CM | POA: Diagnosis not present

## 2022-07-01 DIAGNOSIS — F411 Generalized anxiety disorder: Secondary | ICD-10-CM | POA: Diagnosis not present

## 2022-07-01 NOTE — Progress Notes (Signed)
Post-Op Visit Note   Patient: Jacqueline Bird           Date of Birth: [birth year]           MRN: [id number] Visit Date: 07/01/2022 PCP: Celene Squibb, MD   Assessment & Plan:  Chief Complaint: No chief complaint on file.  Visit Diagnoses:  1. Pain of left hip     Plan: Patient is a pleasant 72 year old female who comes in today approximately 2 weeks status post left hip IM nail from an intertrochanteric femur fracture date of surgery 06/17/2022.  She has been doing okay in regards to the hip but has been in severe rectal pain from previous rectal cancer with subsequent abscess.  Examination of the left hip reveals well-healed surgical incisions with staples intact.  No evidence of infection or cellulitis.  Calves are soft nontender.  She is neurovascularly intact distally.  Today, staples were removed and Steri-Strips were applied.  She will continue working with physical therapy at the rehab facility.  She will follow-up with [place] in 4 weeks for repeat evaluation and left femur x-rays.  Call with concerns or questions.  Follow-Up Instructions: Return in about 4 weeks (around 07/29/2022).   Orders:  Orders Placed This Encounter  Procedures   XR FEMUR MIN 2 VIEWS LEFT   No orders of the defined types were placed in this encounter.   Imaging: XR FEMUR MIN 2 VIEWS LEFT  Result Date: 07/01/2022 X-rays demonstrate stable alignment of the fracture without interval change.  No hardware complication.   PMFS History: Patient Active Problem List   Diagnosis Date Noted   Closed fracture of left hip ([place]) 06/15/2022   Closed comminuted intertrochanteric fracture of left femur ([place]) 06/14/2022   Hypoalbuminemia due to protein-calorie malnutrition ([place]) 06/14/2022   Type 2 diabetes mellitus with hyperglycemia ([place]) 06/14/2022   Mixed hyperlipidemia 06/14/2022   Elevated lipase 06/14/2022   Obesity (BMI 30-39.9) 06/14/2022   DVT (deep venous thrombosis) ([place]) 06/14/2022   Cirrhosis of  liver without ascites ([place]) 06/24/2021   Pleural effusion, right    Acute diastolic CHF (congestive heart failure) ([place]) 05/23/2019   Acute respiratory failure with hypoxia ([place]) 05/23/2019   Hypokalemia 05/23/2019   Essential hypertension 05/23/2019   Type 2 diabetes mellitus with other specified complication ([place]) [id number]   Anxiety 05/23/2019   CHF (congestive heart failure) ([place]) 05/21/2019   Anal cancer ([place]) 02/01/2019   Mass of anus 01/24/2019   Presbycusis of both ears 03/18/2017   Sudden hearing loss, left 03/18/2017   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, left ([place])    Anal cancer ([place])    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   Cervical cancer ([place])    Depression    DVT (deep venous thrombosis) ([facility])    Essential hypertension    History of cervical cancer    Mixed hyperlipidemia    Type 2 diabetes mellitus ([place])     Family History  Problem Relation Age of Onset   Cataracts Mother    Glaucoma Mother    Heart disease Mother    Diabetes Father    Heart attack Father    Cataracts Sister    Lung disease Sister    Cataracts Brother    Diabetes Brother    Heart disease Brother    Macular degeneration Maternal Aunt    Diabetes Maternal Grandfather    Heart disease Sister    Diabetes Sister  Throat cancer Son    Breast cancer Niece    Breast cancer Niece    Amblyopia Neg Hx    Blindness Neg Hx    Retinal detachment Neg Hx    Strabismus Neg Hx    Retinitis pigmentosa Neg Hx     Past Surgical History:  Procedure Laterality Date   AIR/FLUID EXCHANGE Left 03/01/2020   Procedure: AIR/FLUID EXCHANGE;  Surgeon: Jalene Mullet, MD;  Location: [facility];  Service: Ophthalmology;  Laterality: Left;   CATARACT EXTRACTION, BILATERAL Right 05/2017   CERVICAL CONE BIOPSY     HAND AMPUTATION Left    INJECTION OF SILICONE OIL Left [id number]   Procedure: INJECTION OF SILICONE OIL;  Surgeon: Jalene Mullet, MD;  Location: [facility];   Service: Ophthalmology;  Laterality: Left;   INTRAMEDULLARY (IM) NAIL INTERTROCHANTERIC Left 06/17/2022   Procedure: ORIF LEFT HIP INTERTROCHANTERIC FRACTURE;  Surgeon: Leandrew Koyanagi, MD;  Location: [facility];  Service: Orthopedics;  Laterality: Left;   PARS PLANA VITRECTOMY Left 03/01/2020   Procedure: PARS PLANA VITRECTOMY WITH 25 GAUGE;  Surgeon: Jalene Mullet, MD;  Location: [facility];  Service: Ophthalmology;  Laterality: Left;   PHOTOCOAGULATION WITH LASER Left 03/01/2020   Procedure: PHOTOCOAGULATION WITH LASER;  Surgeon: Jalene Mullet, MD;  Location: [facility];  Service: Ophthalmology;  Laterality: Left;   PORTACATH PLACEMENT Left 02/08/2019   Procedure: INSERTION PORT-A-CATH;  Surgeon: Virl Cagey, MD;  Location: [facility];  Service: General;  Laterality: Left;   RECTAL BIOPSY  01/26/2019   Procedure: BIOPSY OF ANAL MASS;  Surgeon: Virl Cagey, MD;  Location: [facility];  Service: General;;   RECTAL BIOPSY N/A 11/13/2019   Procedure: BIOPSY ANAL AREA;  Surgeon: Virl Cagey, MD;  Location: [facility];  Service: General;  Laterality: N/A;   RECTAL EXAM UNDER ANESTHESIA N/A 01/26/2019   Procedure: RECTAL EXAM UNDER ANESTHESIA;  Surgeon: Virl Cagey, MD;  Location[facility];  Service: General;  Laterality: N/A;   RECTAL EXAM UNDER ANESTHESIA N/A 11/13/2019   Procedure: RECTAL EXAM UNDER ANESTHESIA  WITH ANAL BIOPSY;  Surgeon: Virl Cagey, MD;  Location: [facility];  Service: General;  Laterality: N/A;   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 03/01/2020   Procedure: REPAIR OF COMPLEX TRACTION RETINAL DETACHMENT;  Surgeon: Jalene Mullet, MD;  Location: [facility];  Service: Ophthalmology;  Laterality: Left;   RETINAL DETACHMENT SURGERY Bilateral 03/11/2020   Social History   Occupational History   Occupation: retired  Tobacco Use   Smoking status: Never    Passive exposure: Past   Smokeless tobacco: Never  Vaping Use   Vaping Use: Never used  Substance and Sexual  Activity   Alcohol use: Not Currently   Drug use: Never   Sexual activity: Not Currently

## 2022-07-02 ENCOUNTER — Encounter: Payer: Medicare HMO | Admitting: Orthopaedic Surgery

## 2022-07-03 DIAGNOSIS — S72002D Fracture of unspecified part of neck of left femur, subsequent encounter for closed fracture with routine healing: Secondary | ICD-10-CM | POA: Diagnosis not present

## 2022-07-03 DIAGNOSIS — I5030 Unspecified diastolic (congestive) heart failure: Secondary | ICD-10-CM | POA: Diagnosis not present

## 2022-07-07 DIAGNOSIS — S72002D Fracture of unspecified part of neck of left femur, subsequent encounter for closed fracture with routine healing: Secondary | ICD-10-CM | POA: Diagnosis not present

## 2022-07-07 DIAGNOSIS — R5381 Other malaise: Secondary | ICD-10-CM | POA: Diagnosis not present

## 2022-07-10 DIAGNOSIS — E119 Type 2 diabetes mellitus without complications: Secondary | ICD-10-CM | POA: Diagnosis not present

## 2022-07-10 DIAGNOSIS — S72002D Fracture of unspecified part of neck of left femur, subsequent encounter for closed fracture with routine healing: Secondary | ICD-10-CM | POA: Diagnosis not present

## 2022-07-10 DIAGNOSIS — R5381 Other malaise: Secondary | ICD-10-CM | POA: Diagnosis not present

## 2022-07-15 DIAGNOSIS — R5381 Other malaise: Secondary | ICD-10-CM | POA: Diagnosis not present

## 2022-07-15 DIAGNOSIS — S72002D Fracture of unspecified part of neck of left femur, subsequent encounter for closed fracture with routine healing: Secondary | ICD-10-CM | POA: Diagnosis not present

## 2022-07-15 DIAGNOSIS — E119 Type 2 diabetes mellitus without complications: Secondary | ICD-10-CM | POA: Diagnosis not present

## 2022-07-17 DIAGNOSIS — E1140 Type 2 diabetes mellitus with diabetic neuropathy, unspecified: Secondary | ICD-10-CM | POA: Diagnosis not present

## 2022-07-17 DIAGNOSIS — I5030 Unspecified diastolic (congestive) heart failure: Secondary | ICD-10-CM | POA: Diagnosis not present

## 2022-07-17 DIAGNOSIS — R69 Illness, unspecified: Secondary | ICD-10-CM | POA: Diagnosis not present

## 2022-07-17 DIAGNOSIS — M6281 Muscle weakness (generalized): Secondary | ICD-10-CM | POA: Diagnosis not present

## 2022-07-17 DIAGNOSIS — S7292XA Unspecified fracture of left femur, initial encounter for closed fracture: Secondary | ICD-10-CM | POA: Diagnosis not present

## 2022-07-17 DIAGNOSIS — R5381 Other malaise: Secondary | ICD-10-CM | POA: Diagnosis not present

## 2022-07-17 DIAGNOSIS — F3111 Bipolar disorder, current episode manic without psychotic features, mild: Secondary | ICD-10-CM | POA: Diagnosis not present

## 2022-07-17 DIAGNOSIS — E785 Hyperlipidemia, unspecified: Secondary | ICD-10-CM | POA: Diagnosis not present

## 2022-07-17 DIAGNOSIS — F411 Generalized anxiety disorder: Secondary | ICD-10-CM | POA: Diagnosis not present

## 2022-07-17 DIAGNOSIS — R279 Unspecified lack of coordination: Secondary | ICD-10-CM | POA: Diagnosis not present

## 2022-07-22 DIAGNOSIS — C210 Malignant neoplasm of anus, unspecified: Secondary | ICD-10-CM | POA: Diagnosis not present

## 2022-07-22 DIAGNOSIS — S72002D Fracture of unspecified part of neck of left femur, subsequent encounter for closed fracture with routine healing: Secondary | ICD-10-CM | POA: Diagnosis not present

## 2022-07-22 DIAGNOSIS — F3111 Bipolar disorder, current episode manic without psychotic features, mild: Secondary | ICD-10-CM | POA: Diagnosis not present

## 2022-07-22 DIAGNOSIS — R69 Illness, unspecified: Secondary | ICD-10-CM | POA: Diagnosis not present

## 2022-07-22 DIAGNOSIS — R339 Retention of urine, unspecified: Secondary | ICD-10-CM | POA: Diagnosis not present

## 2022-07-22 DIAGNOSIS — I5030 Unspecified diastolic (congestive) heart failure: Secondary | ICD-10-CM | POA: Diagnosis not present

## 2022-07-22 DIAGNOSIS — I10 Essential (primary) hypertension: Secondary | ICD-10-CM | POA: Diagnosis not present

## 2022-07-22 DIAGNOSIS — E119 Type 2 diabetes mellitus without complications: Secondary | ICD-10-CM | POA: Diagnosis not present

## 2022-07-22 DIAGNOSIS — R5381 Other malaise: Secondary | ICD-10-CM | POA: Diagnosis not present

## 2022-07-22 DIAGNOSIS — H9190 Unspecified hearing loss, unspecified ear: Secondary | ICD-10-CM | POA: Diagnosis not present

## 2022-07-22 DIAGNOSIS — Z794 Long term (current) use of insulin: Secondary | ICD-10-CM | POA: Diagnosis not present

## 2022-07-30 ENCOUNTER — Ambulatory Visit (INDEPENDENT_AMBULATORY_CARE_PROVIDER_SITE_OTHER): Payer: Medicare HMO | Admitting: Orthopaedic Surgery

## 2022-07-30 ENCOUNTER — Other Ambulatory Visit (INDEPENDENT_AMBULATORY_CARE_PROVIDER_SITE_OTHER): Payer: Medicare HMO

## 2022-07-30 DIAGNOSIS — M25552 Pain in left hip: Secondary | ICD-10-CM

## 2022-07-30 DIAGNOSIS — S72142A Displaced intertrochanteric fracture of left femur, initial encounter for closed fracture: Secondary | ICD-10-CM

## 2022-07-30 NOTE — Progress Notes (Signed)
Post-Op Visit Note   Patient: Jacqueline Bird           Date of Birth: 1951-02-26           MRN: [id number] Visit Date: 07/30/2022 PCP: Celene Squibb, MD   Assessment & Plan:  Chief Complaint:  Chief Complaint  Patient presents with   Left Leg - Routine Post Op   Visit Diagnoses:  1. Pain of left hip   2. Closed comminuted intertrochanteric fracture of left femur, initial encounter ([place])     Plan: Mardene Celeste 6 weeks status post left intertrochanteric hip fracture.  Doing well overall.  Slightly confused which is baseline.  Examination left hip and thigh shows fully healed surgical scars.  She is able to weight-bear without pain.  X-rays demonstrate stable fixation and continued fracture healing.  I am very happy with how things look.  Continue with physical therapy.  Recheck 1 more time in about 6 weeks with two-view x-rays of the left femur.  Follow-Up Instructions: Return in about 6 weeks (around 09/10/2022).   Orders:  Orders Placed This Encounter  Procedures   XR FEMUR MIN 2 VIEWS LEFT   No orders of the defined types were placed in this encounter.   Imaging: XR FEMUR MIN 2 VIEWS LEFT  Result Date: 07/30/2022 Stable fixation and alignment of intertrochanteric fracture.   PMFS History: Patient Active Problem List   Diagnosis Date Noted   Closed fracture of left hip ([place]) 06/15/2022   Closed comminuted intertrochanteric fracture of left femur ([place]) 06/14/2022   Hypoalbuminemia due to protein-calorie malnutrition ([place]) 06/14/2022   Type 2 diabetes mellitus with hyperglycemia ([place]) 06/14/2022   Mixed hyperlipidemia 06/14/2022   Elevated lipase 06/14/2022   Obesity (BMI 30-39.9) 06/14/2022   DVT (deep venous thrombosis) ([place]) 06/14/2022   Cirrhosis of liver without ascites ([place]) 06/24/2021   Pleural effusion, right    Acute diastolic CHF (congestive heart failure) ([place]) 05/23/2019   Acute respiratory failure with hypoxia ([place]) 05/23/2019   Hypokalemia 05/23/2019    Essential hypertension 05/23/2019   Type 2 diabetes mellitus with other specified complication ([place]) [id number]   Anxiety 05/23/2019   CHF (congestive heart failure) ([place]) 05/21/2019   Anal cancer ([place]) 02/01/2019   Mass of anus 01/24/2019   Presbycusis of both ears 03/18/2017   Sudden hearing loss, left 03/18/2017   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, left ([place])    Anal cancer ([place])    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   Cervical cancer ([place])    Depression    DVT (deep venous thrombosis) ([facility])    Essential hypertension    History of cervical cancer    Mixed hyperlipidemia    Type 2 diabetes mellitus ([place])     Family History  Problem Relation Age of Onset   Cataracts Mother    Glaucoma Mother    Heart disease Mother    Diabetes Father    Heart attack Father    Cataracts Sister    Lung disease Sister    Cataracts Brother    Diabetes Brother    Heart disease Brother    Macular degeneration Maternal Aunt    Diabetes Maternal Grandfather    Heart disease Sister    Diabetes Sister    Throat cancer Son    Breast cancer Niece    Breast cancer Niece    Amblyopia Neg Hx    Blindness Neg Hx  Retinal detachment Neg Hx    Strabismus Neg Hx    Retinitis pigmentosa Neg Hx     Past Surgical History:  Procedure Laterality Date   AIR/FLUID EXCHANGE Left 03/01/2020   Procedure: AIR/FLUID EXCHANGE;  Surgeon: Jalene Mullet, MD;  Location: [facility];  Service: Ophthalmology;  Laterality: Left;   CATARACT EXTRACTION, BILATERAL Right 05/2017   CERVICAL CONE BIOPSY     HAND AMPUTATION Left    INJECTION OF SILICONE OIL Left [id number]   Procedure: INJECTION OF SILICONE OIL;  Surgeon: Jalene Mullet, MD;  Location: [facility];  Service: Ophthalmology;  Laterality: Left;   INTRAMEDULLARY (IM) NAIL INTERTROCHANTERIC Left 06/17/2022   Procedure: ORIF LEFT HIP INTERTROCHANTERIC FRACTURE;  Surgeon: Leandrew Koyanagi, MD;  Location: [employer] [facility];  Service:  Orthopedics;  Laterality: Left;   PARS PLANA VITRECTOMY Left 03/01/2020   Procedure: PARS PLANA VITRECTOMY WITH 25 GAUGE;  Surgeon: Jalene Mullet, MD;  Location: [facility];  Service: Ophthalmology;  Laterality: Left;   PHOTOCOAGULATION WITH LASER Left 03/01/2020   Procedure: PHOTOCOAGULATION WITH LASER;  Surgeon: Jalene Mullet, MD;  Location: [facility];  Service: Ophthalmology;  Laterality: Left;   PORTACATH PLACEMENT Left 02/08/2019   Procedure: INSERTION PORT-A-CATH;  Surgeon: Virl Cagey, MD;  [facility];  Service: General;  Laterality: Left;   RECTAL BIOPSY  01/26/2019   Procedure: BIOPSY OF ANAL MASS;  Surgeon: Virl Cagey, MD;  [facility];  Service: General;;   RECTAL BIOPSY N/A 11/13/2019   Procedure: BIOPSY ANAL AREA;  Surgeon: Virl Cagey, MD;  [facility];  Service: General;  Laterality: N/A;   RECTAL EXAM UNDER ANESTHESIA N/A 01/26/2019   Procedure: RECTAL EXAM UNDER ANESTHESIA;  Surgeon: Virl Cagey, MD;  [facility];  Service: General;  Laterality: N/A;   RECTAL EXAM UNDER ANESTHESIA N/A 11/13/2019   Procedure: RECTAL EXAM UNDER ANESTHESIA  WITH ANAL BIOPSY;  Surgeon: Virl Cagey, MD;  [facility];  Service: General;  Laterality: N/A;   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 03/01/2020   Procedure: REPAIR OF COMPLEX TRACTION RETINAL DETACHMENT;  Surgeon: Jalene Mullet, MD;  Location: [facility];  Service: Ophthalmology;  Laterality: Left;   RETINAL DETACHMENT SURGERY Bilateral 03/11/2020   Social History   Occupational History   Occupation: retired  Tobacco Use   Smoking status: Never    Passive exposure: Past   Smokeless tobacco: Never  Vaping Use   Vaping Use: Never used  Substance and Sexual Activity   Alcohol use: Not Currently   Drug use: Never   Sexual activity: Not Currently

## 2022-08-01 DIAGNOSIS — T1490XA Injury, unspecified, initial encounter: Secondary | ICD-10-CM | POA: Diagnosis not present

## 2022-08-01 DIAGNOSIS — W19XXXA Unspecified fall, initial encounter: Secondary | ICD-10-CM | POA: Diagnosis not present

## 2022-08-03 DIAGNOSIS — Z7722 Contact with and (suspected) exposure to environmental tobacco smoke (acute) (chronic): Secondary | ICD-10-CM | POA: Diagnosis not present

## 2022-08-03 DIAGNOSIS — Z794 Long term (current) use of insulin: Secondary | ICD-10-CM | POA: Diagnosis not present

## 2022-08-03 DIAGNOSIS — Z7985 Long-term (current) use of injectable non-insulin antidiabetic drugs: Secondary | ICD-10-CM | POA: Diagnosis not present

## 2022-08-03 DIAGNOSIS — Z7984 Long term (current) use of oral hypoglycemic drugs: Secondary | ICD-10-CM | POA: Diagnosis not present

## 2022-08-03 DIAGNOSIS — E1165 Type 2 diabetes mellitus with hyperglycemia: Secondary | ICD-10-CM | POA: Diagnosis not present

## 2022-08-03 DIAGNOSIS — I5032 Chronic diastolic (congestive) heart failure: Secondary | ICD-10-CM | POA: Diagnosis not present

## 2022-08-03 DIAGNOSIS — Z6829 Body mass index (BMI) 29.0-29.9, adult: Secondary | ICD-10-CM | POA: Diagnosis not present

## 2022-08-03 DIAGNOSIS — Z9181 History of falling: Secondary | ICD-10-CM | POA: Diagnosis not present

## 2022-08-03 DIAGNOSIS — E1140 Type 2 diabetes mellitus with diabetic neuropathy, unspecified: Secondary | ICD-10-CM | POA: Diagnosis not present

## 2022-08-03 DIAGNOSIS — E669 Obesity, unspecified: Secondary | ICD-10-CM | POA: Diagnosis not present

## 2022-08-03 DIAGNOSIS — I82502 Chronic embolism and thrombosis of unspecified deep veins of left lower extremity: Secondary | ICD-10-CM | POA: Diagnosis not present

## 2022-08-03 DIAGNOSIS — R69 Illness, unspecified: Secondary | ICD-10-CM | POA: Diagnosis not present

## 2022-08-03 DIAGNOSIS — I110 Hypertensive heart disease with heart failure: Secondary | ICD-10-CM | POA: Diagnosis not present

## 2022-08-03 DIAGNOSIS — S72142D Displaced intertrochanteric fracture of left femur, subsequent encounter for closed fracture with routine healing: Secondary | ICD-10-CM | POA: Diagnosis not present

## 2022-08-03 DIAGNOSIS — E46 Unspecified protein-calorie malnutrition: Secondary | ICD-10-CM | POA: Diagnosis not present

## 2022-08-03 DIAGNOSIS — Z7901 Long term (current) use of anticoagulants: Secondary | ICD-10-CM | POA: Diagnosis not present

## 2022-08-03 DIAGNOSIS — C44520 Squamous cell carcinoma of anal skin: Secondary | ICD-10-CM | POA: Diagnosis not present

## 2022-08-03 DIAGNOSIS — S7292XA Unspecified fracture of left femur, initial encounter for closed fracture: Secondary | ICD-10-CM | POA: Diagnosis not present

## 2022-08-06 DIAGNOSIS — Z7901 Long term (current) use of anticoagulants: Secondary | ICD-10-CM | POA: Diagnosis not present

## 2022-08-06 DIAGNOSIS — E1140 Type 2 diabetes mellitus with diabetic neuropathy, unspecified: Secondary | ICD-10-CM | POA: Diagnosis not present

## 2022-08-06 DIAGNOSIS — C44520 Squamous cell carcinoma of anal skin: Secondary | ICD-10-CM | POA: Diagnosis not present

## 2022-08-06 DIAGNOSIS — Z6829 Body mass index (BMI) 29.0-29.9, adult: Secondary | ICD-10-CM | POA: Diagnosis not present

## 2022-08-06 DIAGNOSIS — I5032 Chronic diastolic (congestive) heart failure: Secondary | ICD-10-CM | POA: Diagnosis not present

## 2022-08-06 DIAGNOSIS — E1165 Type 2 diabetes mellitus with hyperglycemia: Secondary | ICD-10-CM | POA: Diagnosis not present

## 2022-08-06 DIAGNOSIS — E669 Obesity, unspecified: Secondary | ICD-10-CM | POA: Diagnosis not present

## 2022-08-06 DIAGNOSIS — E46 Unspecified protein-calorie malnutrition: Secondary | ICD-10-CM | POA: Diagnosis not present

## 2022-08-06 DIAGNOSIS — Z7985 Long-term (current) use of injectable non-insulin antidiabetic drugs: Secondary | ICD-10-CM | POA: Diagnosis not present

## 2022-08-06 DIAGNOSIS — Z9181 History of falling: Secondary | ICD-10-CM | POA: Diagnosis not present

## 2022-08-06 DIAGNOSIS — I82502 Chronic embolism and thrombosis of unspecified deep veins of left lower extremity: Secondary | ICD-10-CM | POA: Diagnosis not present

## 2022-08-06 DIAGNOSIS — S72142D Displaced intertrochanteric fracture of left femur, subsequent encounter for closed fracture with routine healing: Secondary | ICD-10-CM | POA: Diagnosis not present

## 2022-08-06 DIAGNOSIS — I110 Hypertensive heart disease with heart failure: Secondary | ICD-10-CM | POA: Diagnosis not present

## 2022-08-06 DIAGNOSIS — Z794 Long term (current) use of insulin: Secondary | ICD-10-CM | POA: Diagnosis not present

## 2022-08-06 DIAGNOSIS — Z7722 Contact with and (suspected) exposure to environmental tobacco smoke (acute) (chronic): Secondary | ICD-10-CM | POA: Diagnosis not present

## 2022-08-06 DIAGNOSIS — R69 Illness, unspecified: Secondary | ICD-10-CM | POA: Diagnosis not present

## 2022-08-06 DIAGNOSIS — Z7984 Long term (current) use of oral hypoglycemic drugs: Secondary | ICD-10-CM | POA: Diagnosis not present

## 2022-08-10 DIAGNOSIS — E1165 Type 2 diabetes mellitus with hyperglycemia: Secondary | ICD-10-CM | POA: Diagnosis not present

## 2022-08-10 DIAGNOSIS — Z7901 Long term (current) use of anticoagulants: Secondary | ICD-10-CM | POA: Diagnosis not present

## 2022-08-10 DIAGNOSIS — E1140 Type 2 diabetes mellitus with diabetic neuropathy, unspecified: Secondary | ICD-10-CM | POA: Diagnosis not present

## 2022-08-10 DIAGNOSIS — Z7985 Long-term (current) use of injectable non-insulin antidiabetic drugs: Secondary | ICD-10-CM | POA: Diagnosis not present

## 2022-08-10 DIAGNOSIS — I5032 Chronic diastolic (congestive) heart failure: Secondary | ICD-10-CM | POA: Diagnosis not present

## 2022-08-10 DIAGNOSIS — Z6829 Body mass index (BMI) 29.0-29.9, adult: Secondary | ICD-10-CM | POA: Diagnosis not present

## 2022-08-10 DIAGNOSIS — I82502 Chronic embolism and thrombosis of unspecified deep veins of left lower extremity: Secondary | ICD-10-CM | POA: Diagnosis not present

## 2022-08-10 DIAGNOSIS — Z7722 Contact with and (suspected) exposure to environmental tobacco smoke (acute) (chronic): Secondary | ICD-10-CM | POA: Diagnosis not present

## 2022-08-10 DIAGNOSIS — C44520 Squamous cell carcinoma of anal skin: Secondary | ICD-10-CM | POA: Diagnosis not present

## 2022-08-10 DIAGNOSIS — E46 Unspecified protein-calorie malnutrition: Secondary | ICD-10-CM | POA: Diagnosis not present

## 2022-08-10 DIAGNOSIS — I110 Hypertensive heart disease with heart failure: Secondary | ICD-10-CM | POA: Diagnosis not present

## 2022-08-10 DIAGNOSIS — S72142D Displaced intertrochanteric fracture of left femur, subsequent encounter for closed fracture with routine healing: Secondary | ICD-10-CM | POA: Diagnosis not present

## 2022-08-10 DIAGNOSIS — R69 Illness, unspecified: Secondary | ICD-10-CM | POA: Diagnosis not present

## 2022-08-10 DIAGNOSIS — Z794 Long term (current) use of insulin: Secondary | ICD-10-CM | POA: Diagnosis not present

## 2022-08-10 DIAGNOSIS — Z9181 History of falling: Secondary | ICD-10-CM | POA: Diagnosis not present

## 2022-08-10 DIAGNOSIS — Z7984 Long term (current) use of oral hypoglycemic drugs: Secondary | ICD-10-CM | POA: Diagnosis not present

## 2022-08-10 DIAGNOSIS — E669 Obesity, unspecified: Secondary | ICD-10-CM | POA: Diagnosis not present

## 2022-08-11 ENCOUNTER — Inpatient Hospital Stay: Payer: Medicare HMO | Attending: Hematology

## 2022-08-11 ENCOUNTER — Ambulatory Visit (HOSPITAL_COMMUNITY): Payer: Medicare HMO

## 2022-08-12 DIAGNOSIS — Z79899 Other long term (current) drug therapy: Secondary | ICD-10-CM | POA: Diagnosis not present

## 2022-08-12 DIAGNOSIS — Z8781 Personal history of (healed) traumatic fracture: Secondary | ICD-10-CM | POA: Diagnosis not present

## 2022-08-12 DIAGNOSIS — F418 Other specified anxiety disorders: Secondary | ICD-10-CM | POA: Diagnosis not present

## 2022-08-12 DIAGNOSIS — R69 Illness, unspecified: Secondary | ICD-10-CM | POA: Diagnosis not present

## 2022-08-13 DIAGNOSIS — Z7901 Long term (current) use of anticoagulants: Secondary | ICD-10-CM | POA: Diagnosis not present

## 2022-08-13 DIAGNOSIS — Z6829 Body mass index (BMI) 29.0-29.9, adult: Secondary | ICD-10-CM | POA: Diagnosis not present

## 2022-08-13 DIAGNOSIS — E669 Obesity, unspecified: Secondary | ICD-10-CM | POA: Diagnosis not present

## 2022-08-13 DIAGNOSIS — I5032 Chronic diastolic (congestive) heart failure: Secondary | ICD-10-CM | POA: Diagnosis not present

## 2022-08-13 DIAGNOSIS — E1165 Type 2 diabetes mellitus with hyperglycemia: Secondary | ICD-10-CM | POA: Diagnosis not present

## 2022-08-13 DIAGNOSIS — Z7984 Long term (current) use of oral hypoglycemic drugs: Secondary | ICD-10-CM | POA: Diagnosis not present

## 2022-08-13 DIAGNOSIS — E46 Unspecified protein-calorie malnutrition: Secondary | ICD-10-CM | POA: Diagnosis not present

## 2022-08-13 DIAGNOSIS — Z794 Long term (current) use of insulin: Secondary | ICD-10-CM | POA: Diagnosis not present

## 2022-08-13 DIAGNOSIS — Z9181 History of falling: Secondary | ICD-10-CM | POA: Diagnosis not present

## 2022-08-13 DIAGNOSIS — R69 Illness, unspecified: Secondary | ICD-10-CM | POA: Diagnosis not present

## 2022-08-13 DIAGNOSIS — Z7722 Contact with and (suspected) exposure to environmental tobacco smoke (acute) (chronic): Secondary | ICD-10-CM | POA: Diagnosis not present

## 2022-08-13 DIAGNOSIS — E1140 Type 2 diabetes mellitus with diabetic neuropathy, unspecified: Secondary | ICD-10-CM | POA: Diagnosis not present

## 2022-08-13 DIAGNOSIS — I82502 Chronic embolism and thrombosis of unspecified deep veins of left lower extremity: Secondary | ICD-10-CM | POA: Diagnosis not present

## 2022-08-13 DIAGNOSIS — S72142D Displaced intertrochanteric fracture of left femur, subsequent encounter for closed fracture with routine healing: Secondary | ICD-10-CM | POA: Diagnosis not present

## 2022-08-13 DIAGNOSIS — Z7985 Long-term (current) use of injectable non-insulin antidiabetic drugs: Secondary | ICD-10-CM | POA: Diagnosis not present

## 2022-08-13 DIAGNOSIS — I110 Hypertensive heart disease with heart failure: Secondary | ICD-10-CM | POA: Diagnosis not present

## 2022-08-13 DIAGNOSIS — C44520 Squamous cell carcinoma of anal skin: Secondary | ICD-10-CM | POA: Diagnosis not present

## 2022-08-18 ENCOUNTER — Ambulatory Visit: Payer: Medicare HMO | Admitting: Hematology

## 2022-08-18 ENCOUNTER — Encounter (HOSPITAL_COMMUNITY): Payer: Self-pay | Admitting: Family Medicine

## 2022-09-17 DIAGNOSIS — E119 Type 2 diabetes mellitus without complications: Secondary | ICD-10-CM | POA: Diagnosis not present

## 2022-09-29 DIAGNOSIS — E782 Mixed hyperlipidemia: Secondary | ICD-10-CM | POA: Diagnosis not present

## 2022-09-29 DIAGNOSIS — E539 Vitamin B deficiency, unspecified: Secondary | ICD-10-CM | POA: Diagnosis not present

## 2022-09-29 DIAGNOSIS — E559 Vitamin D deficiency, unspecified: Secondary | ICD-10-CM | POA: Diagnosis not present

## 2022-09-29 DIAGNOSIS — E119 Type 2 diabetes mellitus without complications: Secondary | ICD-10-CM | POA: Diagnosis not present

## 2022-10-02 ENCOUNTER — Ambulatory Visit (HOSPITAL_COMMUNITY)
Admission: RE | Admit: 2022-10-02 | Discharge: 2022-10-02 | Disposition: A | Discharge status: Home / Self Care | Payer: Medicare HMO | Source: Ambulatory Visit | Attending: Hematology | Admitting: Hematology

## 2022-10-02 ENCOUNTER — Inpatient Hospital Stay: Payer: Medicare HMO | Attending: Hematology

## 2022-10-02 DIAGNOSIS — Z86711 Personal history of pulmonary embolism: Secondary | ICD-10-CM | POA: Insufficient documentation

## 2022-10-02 DIAGNOSIS — Z79899 Other long term (current) drug therapy: Secondary | ICD-10-CM | POA: Insufficient documentation

## 2022-10-02 DIAGNOSIS — C210 Malignant neoplasm of anus, unspecified: Secondary | ICD-10-CM | POA: Diagnosis not present

## 2022-10-02 DIAGNOSIS — Z7901 Long term (current) use of anticoagulants: Secondary | ICD-10-CM | POA: Diagnosis not present

## 2022-10-02 DIAGNOSIS — Z9221 Personal history of antineoplastic chemotherapy: Secondary | ICD-10-CM | POA: Diagnosis not present

## 2022-10-02 DIAGNOSIS — I10 Essential (primary) hypertension: Secondary | ICD-10-CM | POA: Insufficient documentation

## 2022-10-02 DIAGNOSIS — K626 Ulcer of anus and rectum: Secondary | ICD-10-CM | POA: Insufficient documentation

## 2022-10-02 DIAGNOSIS — E119 Type 2 diabetes mellitus without complications: Secondary | ICD-10-CM | POA: Diagnosis not present

## 2022-10-02 DIAGNOSIS — Z7984 Long term (current) use of oral hypoglycemic drugs: Secondary | ICD-10-CM | POA: Insufficient documentation

## 2022-10-02 DIAGNOSIS — E782 Mixed hyperlipidemia: Secondary | ICD-10-CM | POA: Insufficient documentation

## 2022-10-02 DIAGNOSIS — Z7985 Long-term (current) use of injectable non-insulin antidiabetic drugs: Secondary | ICD-10-CM | POA: Insufficient documentation

## 2022-10-02 DIAGNOSIS — K6389 Other specified diseases of intestine: Secondary | ICD-10-CM | POA: Diagnosis not present

## 2022-10-02 DIAGNOSIS — Z85048 Personal history of other malignant neoplasm of rectum, rectosigmoid junction, and anus: Secondary | ICD-10-CM | POA: Diagnosis not present

## 2022-10-02 LAB — CBC WITH DIFFERENTIAL/PLATELET
Abs Immature Granulocytes: 0.03 10*3/uL (ref 0.00–0.07)
Basophils Absolute: 0.1 10*3/uL (ref 0.0–0.1)
Basophils Relative: 1 %
Eosinophils Absolute: 0.1 10*3/uL (ref 0.0–0.5)
Eosinophils Relative: 1 %
HCT: 38.7 % (ref 36.0–46.0)
Hemoglobin: 12.3 g/dL (ref 12.0–15.0)
Immature Granulocytes: 0 %
Lymphocytes Relative: 15 %
Lymphs Abs: 1.2 10*3/uL (ref 0.7–4.0)
MCH: 28.3 pg (ref 26.0–34.0)
MCHC: 31.8 g/dL (ref 30.0–36.0)
MCV: 89.2 fL (ref 80.0–100.0)
Monocytes Absolute: 0.7 10*3/uL (ref 0.1–1.0)
Monocytes Relative: 9 %
Neutro Abs: 5.8 10*3/uL (ref 1.7–7.7)
Neutrophils Relative %: 74 %
Platelets: 252 10*3/uL (ref 150–400)
RBC: 4.34 MIL/uL (ref 3.87–5.11)
RDW: 15.7 % — ABNORMAL HIGH (ref 11.5–15.5)
WBC: 7.9 10*3/uL (ref 4.0–10.5)
nRBC: 0 % (ref 0.0–0.2)

## 2022-10-02 LAB — COMPREHENSIVE METABOLIC PANEL
ALT: 17 U/L (ref 0–44)
AST: 20 U/L (ref 15–41)
Albumin: 3.3 g/dL — ABNORMAL LOW (ref 3.5–5.0)
Alkaline Phosphatase: 94 U/L (ref 38–126)
Anion gap: 9 (ref 5–15)
BUN: 11 mg/dL (ref 8–23)
CO2: 26 mmol/L (ref 22–32)
Calcium: 8.8 mg/dL — ABNORMAL LOW (ref 8.9–10.3)
Chloride: 102 mmol/L (ref 98–111)
Creatinine, Ser: 0.48 mg/dL (ref 0.44–1.00)
GFR, Estimated: >60 mL/min (ref >60)
Glucose, Bld: 98 mg/dL (ref 70–99)
Potassium: 3.5 mmol/L (ref 3.5–5.1)
Sodium: 137 mmol/L (ref 135–145)
Total Bilirubin: 0.8 mg/dL (ref 0.3–1.2)
Total Protein: 7.2 g/dL (ref 6.5–8.1)

## 2022-10-02 MED ORDER — HEPARIN SOD (PORK) LOCK FLUSH 100 UNIT/ML IV SOLN: 500.0000 [IU] | Freq: Once | INTRAVENOUS | Status: DC

## 2022-10-02 MED ORDER — HEPARIN SOD (PORK) LOCK FLUSH 100 UNIT/ML IV SOLN
INTRAVENOUS | Status: AC
  Filled 2022-10-02: qty 5

## 2022-10-02 MED ORDER — SODIUM CHLORIDE 0.9% FLUSH
10.0000 mL | Freq: Once | INTRAVENOUS | Status: AC
  Administered 2022-10-02: 10 mL via INTRAVENOUS

## 2022-10-02 MED ORDER — IOHEXOL 9 MG/ML PO SOLN
ORAL | Status: AC
  Filled 2022-10-02: qty 500

## 2022-10-02 MED ORDER — IOHEXOL 300 MG/ML  SOLN
100.0000 mL | Freq: Once | INTRAMUSCULAR | Status: AC | PRN
  Administered 2022-10-02: 100 mL via INTRAVENOUS

## 2022-10-02 NOTE — Patient Instructions (Signed)
[facility]  Discharge Instructions: Thank you for choosing [facility] to provide your oncology and hematology care.  If you have a lab appointment with the Cancer Center - please note that after April 8th, 2024, all labs will be drawn in the cancer center.  You do not have to check in or register with the main entrance as you have in the past but will complete your check-in in the cancer center.  Wear comfortable clothing and clothing appropriate for easy access to any Portacath or PICC line.   We strive to give you quality time with your provider. You may need to reschedule your appointment if you arrive late (15 or more minutes).  Arriving late affects you and other patients whose appointments are after yours.  Also, if you miss three or more appointments without notifying the office, you may be dismissed from the clinic at the provider's discretion.      For prescription refill requests, have your pharmacy contact our office and allow 72 hours for refills to be completed.    Today you received the following chemotherapy and/or immunotherapy agents [place] labs      To help prevent nausea and vomiting after your treatment, we encourage you to take your nausea medication as directed.  BELOW ARE SYMPTOMS THAT SHOULD BE REPORTED IMMEDIATELY: *FEVER GREATER THAN 100.4 F (38 C) OR HIGHER *CHILLS OR SWEATING *NAUSEA AND VOMITING THAT IS NOT CONTROLLED WITH YOUR NAUSEA MEDICATION *UNUSUAL SHORTNESS OF BREATH *UNUSUAL BRUISING OR BLEEDING *URINARY PROBLEMS (pain or burning when urinating, or frequent urination) *BOWEL PROBLEMS (unusual diarrhea, constipation, pain near the anus) TENDERNESS IN MOUTH AND THROAT WITH OR WITHOUT PRESENCE OF ULCERS (sore throat, sores in mouth, or a toothache) UNUSUAL RASH, SWELLING OR PAIN  UNUSUAL VAGINAL DISCHARGE OR ITCHING   Items with * indicate a potential emergency and should be followed up as soon as possible or go to  the Emergency Department if any problems should occur.  Please show the CHEMOTHERAPY ALERT CARD or IMMUNOTHERAPY ALERT CARD at check-in to the Emergency Department and triage nurse.  Should you have questions after your visit or need to cancel or reschedule your appointment, please contact [facility] [phone]  and follow the prompts.  Office hours are 8:00 a.m. to 4:30 p.m. Monday - Friday. Please note that voicemails left after 4:00 p.m. may not be returned until the following business day.  We are closed weekends and major holidays. You have access to a nurse at all times for urgent questions. Please call the main number to the clinic [phone] and follow the prompts.  For any non-urgent questions, you may also contact your provider using MyChart. We now offer e-Visits for anyone 22 and older to request care online for non-urgent symptoms. For details visit [email or web address].   Also download the MyChart app! Go to the app store, search "MyChart", open the app, select [place], and log in with your MyChart username and password.

## 2022-10-02 NOTE — Progress Notes (Signed)
Patients port flushed without difficulty.  Good blood return noted with no bruising or swelling noted at site.  Patient to remain accessed for CT.

## 2022-10-06 DIAGNOSIS — E1165 Type 2 diabetes mellitus with hyperglycemia: Secondary | ICD-10-CM | POA: Diagnosis not present

## 2022-10-06 DIAGNOSIS — E785 Hyperlipidemia, unspecified: Secondary | ICD-10-CM | POA: Diagnosis not present

## 2022-10-06 DIAGNOSIS — E119 Type 2 diabetes mellitus without complications: Secondary | ICD-10-CM | POA: Diagnosis not present

## 2022-10-06 DIAGNOSIS — G4700 Insomnia, unspecified: Secondary | ICD-10-CM | POA: Diagnosis not present

## 2022-10-06 DIAGNOSIS — I25119 Atherosclerotic heart disease of native coronary artery with unspecified angina pectoris: Secondary | ICD-10-CM | POA: Diagnosis not present

## 2022-10-06 DIAGNOSIS — R0781 Pleurodynia: Secondary | ICD-10-CM | POA: Diagnosis not present

## 2022-10-06 DIAGNOSIS — E559 Vitamin D deficiency, unspecified: Secondary | ICD-10-CM | POA: Diagnosis not present

## 2022-10-06 DIAGNOSIS — C211 Malignant neoplasm of anal canal: Secondary | ICD-10-CM | POA: Diagnosis not present

## 2022-10-06 DIAGNOSIS — F418 Other specified anxiety disorders: Secondary | ICD-10-CM | POA: Diagnosis not present

## 2022-10-07 ENCOUNTER — Other Ambulatory Visit (HOSPITAL_COMMUNITY): Payer: Self-pay | Admitting: Internal Medicine

## 2022-10-07 DIAGNOSIS — Z13820 Encounter for screening for osteoporosis: Secondary | ICD-10-CM

## 2022-10-08 ENCOUNTER — Inpatient Hospital Stay (HOSPITAL_BASED_OUTPATIENT_CLINIC_OR_DEPARTMENT_OTHER): Payer: Medicare HMO | Admitting: Hematology

## 2022-10-08 VITALS — BP 107/68 | HR 85 | Temp 98.7°F | Resp 19 | Ht 64.5 in | Wt 159.9 lb

## 2022-10-08 DIAGNOSIS — C210 Malignant neoplasm of anus, unspecified: Secondary | ICD-10-CM | POA: Diagnosis not present

## 2022-10-08 DIAGNOSIS — Z86711 Personal history of pulmonary embolism: Secondary | ICD-10-CM | POA: Diagnosis not present

## 2022-10-08 DIAGNOSIS — I10 Essential (primary) hypertension: Secondary | ICD-10-CM | POA: Diagnosis not present

## 2022-10-08 DIAGNOSIS — Z79899 Other long term (current) drug therapy: Secondary | ICD-10-CM | POA: Diagnosis not present

## 2022-10-08 DIAGNOSIS — E782 Mixed hyperlipidemia: Secondary | ICD-10-CM | POA: Diagnosis not present

## 2022-10-08 DIAGNOSIS — K626 Ulcer of anus and rectum: Secondary | ICD-10-CM | POA: Diagnosis not present

## 2022-10-08 DIAGNOSIS — Z9221 Personal history of antineoplastic chemotherapy: Secondary | ICD-10-CM | POA: Diagnosis not present

## 2022-10-08 DIAGNOSIS — E119 Type 2 diabetes mellitus without complications: Secondary | ICD-10-CM | POA: Diagnosis not present

## 2022-10-08 DIAGNOSIS — Z7901 Long term (current) use of anticoagulants: Secondary | ICD-10-CM | POA: Diagnosis not present

## 2022-10-08 DIAGNOSIS — Z85048 Personal history of other malignant neoplasm of rectum, rectosigmoid junction, and anus: Secondary | ICD-10-CM | POA: Diagnosis not present

## 2022-10-08 NOTE — Progress Notes (Signed)
[facility] [address] [place] [address]    Clinic Day:  10/08/2022  Referring physician: Benita Stabile, MD  Patient Care Team: Benita Stabile, MD as PCP - General (Internal Medicine) Jonelle Sidle, MD as PCP - Cardiology (Cardiology) Doreatha Massed, MD as Medical Oncologist (Medical Oncology) Mickie Bail, RN as Oncology Nurse Navigator   ASSESSMENT & PLAN:   Assessment: 1.  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 -PET scan on 10/23/2019 showed no specific worrisome recurrent hypermetabolic anal activity. Maximum SUV in the anal region was 4.1, previously 4.3. No recurrent pathologically enlarged or hypermetabolic inguinal or external iliac lymph nodes. No metastatic spread. -Biopsy of the anal ulcer consistent with benign inflamed squamous mucosa with ulceration. -CTAP on 03/21/2020 with no acute process or evidence of metastatic disease in the abdomen or pelvis.  Lower rectal wall thickening is mild, most likely treatment related.  Mild cirrhosis.   2.  Recurrent thromboembolism: - Pulmonary embolism in 1973, status post Coumadin for 18 months. - Unprovoked left leg DVT on 11/27/2020, currently on Eliquis indefinitely.  Plan:  1.  Stage IIIc (T3 N1c) squamous cell carcinoma of the anus: - She fell and had fractured her left hip and underwent surgery. - I reviewed labs from 10/02/2022: Normal LFTs.  CBC grossly normal. - CTAP on 10/02/2022: No evidence of recurrence or metastatic disease. - Recommend follow-up in 6 months with repeat labs and scan.  2.  Unprovoked left leg DVT: - Continue Eliquis indefinitely.  No bleeding issues.   Orders Placed This Encounter  Procedures   CT Abdomen Pelvis W Contrast    Standing Status:   Future    Standing Expiration Date:   10/08/2023    Order Specific Question:   If indicated for the ordered procedure, I authorize the  administration of contrast media per Radiology protocol    Answer:   Yes    Order Specific Question:   Does the patient have a contrast media/X-ray dye allergy?    Answer:   No    Order Specific Question:   Preferred imaging location?    Answer:   [facility]    Order Specific Question:   Release to patient    Answer:   Immediate [1]    Order Specific Question:   If indicated for the ordered procedure, I authorize the administration of oral contrast media per Radiology protocol    Answer:   Yes   CBC with Differential/Platelet    Standing Status:   Future    Standing Expiration Date:   10/08/2023    Order Specific Question:   Release to patient    Answer:   Immediate   Comprehensive metabolic panel    Standing Status:   Future    Standing Expiration Date:   10/08/2023    Order Specific Question:   Release to patient    Answer:   Immediate      I,[name],acting as a scribe for Doreatha Massed, MD.,have documented all relevant documentation on the behalf of Doreatha Massed, MD,as directed by  Doreatha Massed, MD while in the presence of Doreatha Massed, MD.   I, Doreatha Massed MD, have reviewed the above documentation for accuracy and completeness, and I agree with the above.   Doreatha Massed, MD   5/23/20246:35 PM  CHIEF COMPLAINT:   Diagnosis: anal cancer    Cancer Staging  Anal cancer (HCC) Staging form: Anus, AJCC 8th Edition - Clinical: Stage IIIC (cT3, cN1c, cM0) - Signed by Dorothy Puffer, MD on 02/16/2019    Prior Therapy: 5-FU & mitomycin from 03/03/2019 to 03/31/2019   Current Therapy:  surveillance   HISTORY OF PRESENT ILLNESS:   Oncology History  Anal cancer (HCC)  02/01/2019 Initial Diagnosis   Anal cancer (HCC)   02/16/2019 Cancer Staging   Staging form: Anus, AJCC 8th Edition - Clinical: Stage IIIC (cT3, cN1c, cM0) - Signed by Dorothy Puffer, MD on 02/16/2019   03/03/2019 - 04/04/2019 Chemotherapy   Patient is on  Treatment Plan : ANUS Mitomycin D1,28 / 5FU D1-4, 28-31 q32d        INTERVAL HISTORY:   Jacqueline Bird is a 72 y.o. female presenting to clinic today for follow up of anal cancer. She was last seen by me on 02/10/22.  Since her last visit, she underwent surveillance CT A/P on 10/02/22 showing no evidence of recurrent or metastatic disease.   Today, she states that she is doing well overall. Her appetite level is at 70%. Her energy level is at 10%.  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   Cervical cancer (HCC)    Depression    DVT (deep venous thrombosis) (HCC)    Essential hypertension    History of cervical cancer    Mixed hyperlipidemia    Type 2 diabetes mellitus [facility])     Surgical History: Past Surgical History:  Procedure Laterality Date   AIR/FLUID EXCHANGE Left 03/01/2020   Procedure: AIR/FLUID EXCHANGE;  Surgeon: Carmela Rima, MD;  Location: [facility] OR;  Service: Ophthalmology;  Laterality: Left;   CATARACT EXTRACTION, BILATERAL Right 05/2017   CERVICAL CONE BIOPSY     HAND AMPUTATION Left    INJECTION OF SILICONE OIL Left 03/01/2020   Procedure: INJECTION OF SILICONE OIL;  Surgeon: Carmela Rima, MD;  Location[facility] OR;  Service: Ophthalmology;  Laterality: Left;   INTRAMEDULLARY (IM) NAIL INTERTROCHANTERIC Left 06/17/2022   Procedure: ORIF LEFT HIP INTERTROCHANTERIC FRACTURE;  Surgeon: Tarry Kos, MD;  Location: [facility] OR;  Service: Orthopedics;  Laterality: Left;   PARS PLANA VITRECTOMY Left 03/01/2020   Procedure: PARS PLANA VITRECTOMY WITH 25 GAUGE;  Surgeon: Carmela Rima, MD;  Location: [facility] OR;  Service: Ophthalmology;  Laterality: Left;   PHOTOCOAGULATION WITH LASER Left 03/01/2020   Procedure: PHOTOCOAGULATION WITH LASER;  Surgeon: Carmela Rima, MD;  Location: [facility] OR;  Service: Ophthalmology;  Laterality:  Left;   PORTACATH PLACEMENT Left 02/08/2019   Procedure: INSERTION PORT-A-CATH;  Surgeon: Lucretia Roers, MD;  Location: [facility] ORS;  Service: General;  Laterality: Left;   RECTAL BIOPSY  01/26/2019   Procedure: BIOPSY OF ANAL MASS;  Surgeon: Lucretia Roers, MD;  Location: [facility] ORS;  Service: General;;   RECTAL BIOPSY N/A 11/13/2019   Procedure: BIOPSY ANAL AREA;  Surgeon: Lucretia Roers, MD;  Location: [facility] ORS;  Service: General;  Laterality: N/A;   RECTAL EXAM UNDER ANESTHESIA N/A 01/26/2019   Procedure: RECTAL EXAM UNDER ANESTHESIA;  Surgeon: Lucretia Roers, MD;  Location: [facility] ORS;  Service: General;  Laterality: N/A;   RECTAL EXAM UNDER ANESTHESIA N/A 11/13/2019   Procedure: RECTAL EXAM UNDER ANESTHESIA  WITH ANAL BIOPSY;  Surgeon: Lucretia Roers, MD;  Location: [facility] ORS;  Service: General;  Laterality: N/A;   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 03/01/2020   Procedure: REPAIR OF COMPLEX TRACTION RETINAL DETACHMENT;  Surgeon: Carmela Rima, MD;  Location: [facility] OR;  Service: Ophthalmology;  Laterality: Left;   RETINAL DETACHMENT SURGERY Bilateral 03/11/2020    Social History: Social History   Socioeconomic History   Marital status: Single    Spouse name: Not on file   Number of children: 2   Years of education: Not on file   Highest education level: Not on file  Occupational History   Occupation: retired  Tobacco Use   Smoking status: Never    Passive exposure: Past   Smokeless tobacco: Never  Vaping Use   Vaping Use: Never used  Substance and Sexual Activity   Alcohol use: Not Currently   Drug use: Never   Sexual activity: Not Currently  Other Topics Concern   Not on file  Social History Narrative   Pt is retired and currently engaged.   Social Determinants of Health   Financial Resource Strain: Medium Risk (02/06/2019)   Overall Financial Resource Strain (CARDIA)    Difficulty of Paying Living Expenses: Somewhat hard  Food Insecurity: No Food  Insecurity (06/16/2022)   Hunger Vital Sign    Worried About Running Out of Food in the Last Year: Never true    Ran Out of Food in the Last Year: Never true  Transportation Needs: No Transportation Needs (06/16/2022)   PRAPARE - Administrator, Civil Service (Medical): No    Lack of Transportation (Non-Medical): No  Physical Activity: Sufficiently Active (02/06/2019)   Exercise Vital Sign    Days of Exercise per Week: 7 days    Minutes of Exercise per Session: 60 min  Stress: Stress Concern Present (02/06/2019)   [employer] of Occupational Health - Occupational Stress Questionnaire    Feeling of Stress : To some extent  Social Connections: Moderately Integrated (02/06/2019)   Social Connection and Isolation Panel [NHANES]    Frequency of Communication with Friends and Family: Three times a week    Frequency of Social Gatherings with Friends and Family: Three times a week    Attends Religious Services: 1 to 4 times per year    Active Member of Clubs or Organizations: Yes    Attends Banker Meetings: 1 to 4 times per year    Marital Status: Separated  Intimate Partner Violence: Not At Risk (06/16/2022)   Humiliation, Afraid, Rape, and Kick questionnaire    Fear of Current or Ex-Partner: No    Emotionally Abused: No    Physically Abused: No    Sexually Abused: No    Family History: Family History  Problem Relation Age of Onset   Cataracts Mother    Glaucoma Mother    Heart disease Mother    Diabetes Father    Heart attack Father    Cataracts Sister    Lung disease Sister    Cataracts Brother    Diabetes Brother    Heart disease Brother    Macular degeneration Maternal Aunt    Diabetes Maternal Grandfather    Heart disease Sister    Diabetes Sister    Throat cancer Son    Breast cancer Niece    Breast cancer Niece    Amblyopia Neg Hx    Blindness Neg Hx    Retinal detachment Neg Hx    Strabismus Neg Hx    Retinitis pigmentosa Neg Hx  Current Medications:  Current Outpatient Medications:    acetaminophen (TYLENOL) 500 MG tablet, Take 500-1,000 mg by mouth every 6 (six) hours as needed for moderate pain., Disp: , Rfl:    ALPRAZolam (XANAX) 0.5 MG tablet, Take 1 tablet (0.5 mg total) by mouth at bedtime., Disp: 20 tablet, Rfl: 0   ATIVAN 1 MG tablet, Take 1 mg by mouth once., Disp: , Rfl:    atorvastatin (LIPITOR) 80 MG tablet, Take 80 mg by mouth every evening. , Disp: , Rfl:    FARXIGA 10 MG TABS tablet, Take 1 tablet (10 mg total) by mouth daily., Disp: 30 tablet, Rfl: 0   gabapentin (NEURONTIN) 100 MG capsule, Take 1-2 capsules (100-200 mg total) by mouth See admin instructions. Take 200 mg by mouth in the morning and take 100 mg at night., Disp: 25 capsule, Rfl: 0   methocarbamol (ROBAXIN) 500 MG tablet, Take 1 tablet (500 mg total) by mouth every 6 (six) hours as needed for muscle spasms., Disp: 25 tablet, Rfl: 0   sertraline (ZOLOFT) 100 MG tablet, Take 1 tablet (100 mg total) by mouth daily., Disp: 30 tablet, Rfl: 0   traMADol (ULTRAM) 50 MG tablet, Take 50 mg by mouth daily., Disp: , Rfl:    traZODone (DESYREL) 50 MG tablet, Take 2 tablets (100 mg total) by mouth at bedtime., Disp: 30 tablet, Rfl: 0   TRULICITY 0.75 MG/0.5ML SOPN, Inject 0.75 mg into the muscle every Sunday. , Disp: , Rfl:    ELIQUIS 5 MG TABS tablet, Take 1 tablet (5 mg total) by mouth 2 (two) times daily for 20 days., Disp: 40 tablet, Rfl: 0   Allergies: Allergies  Allergen Reactions   Sulfa Antibiotics Anaphylaxis and Swelling    Per pt, can use creams with sulfa in it    REVIEW OF SYSTEMS:   Review of Systems  Constitutional:  Negative for chills, fatigue and fever.  HENT:   Negative for lump/mass, mouth sores, nosebleeds, sore throat and trouble swallowing.   Eyes:  Negative for eye problems.  Respiratory:  Positive for shortness of breath. Negative for cough.   Cardiovascular:  Negative for chest pain, leg swelling and  palpitations.  Gastrointestinal:  Positive for diarrhea and nausea. Negative for abdominal pain, constipation and vomiting.  Genitourinary:  Negative for bladder incontinence, difficulty urinating, dysuria, frequency, hematuria and nocturia.   Musculoskeletal:  Negative for arthralgias, back pain, flank pain, myalgias and neck pain.  Skin:  Negative for itching and rash.  Neurological:  Negative for dizziness, headaches and numbness.  Hematological:  Does not bruise/bleed easily.  Psychiatric/Behavioral:  Negative for depression, sleep disturbance and suicidal ideas. The patient is not nervous/anxious.   All other systems reviewed and are negative.    VITALS:   Blood pressure 107/68, pulse 85, temperature 98.7 F (37.1 C), temperature source Oral, resp. rate 19, height 5' 4.5" (1.638 m), weight 159 lb 14.4 oz (72.5 kg), SpO2 99 %.  Wt Readings from Last 3 Encounters:  10/08/22 159 lb 14.4 oz (72.5 kg)  06/14/22 179 lb (81.2 kg)  05/25/22 180 lb 9.6 oz (81.9 kg)    Body mass index is 27.02 kg/m.  Performance status (ECOG): 1 - Symptomatic but completely ambulatory  PHYSICAL EXAM:   Physical Exam Vitals and nursing note reviewed. Exam conducted with a chaperone present.  Constitutional:      Appearance: Normal appearance.  Cardiovascular:     Rate and Rhythm: Normal rate and regular rhythm.     Pulses:  Normal pulses.     Heart sounds: Normal heart sounds.  Pulmonary:     Effort: Pulmonary effort is normal.     Breath sounds: Normal breath sounds.  Abdominal:     Palpations: Abdomen is soft. There is no hepatomegaly, splenomegaly or mass.     Tenderness: There is no abdominal tenderness.  Musculoskeletal:     Right lower leg: No edema.     Left lower leg: No edema.  Lymphadenopathy:     Cervical: No cervical adenopathy.     Right cervical: No superficial, deep or posterior cervical adenopathy.    Left cervical: No superficial, deep or posterior cervical adenopathy.      Upper Body:     Right upper body: No supraclavicular or axillary adenopathy.     Left upper body: No supraclavicular or axillary adenopathy.  Neurological:     General: No focal deficit present.     Mental Status: She is alert and oriented to person, place, and time.  Psychiatric:        Mood and Affect: Mood normal.        Behavior: Behavior normal.     LABS:      Latest Ref Rng & Units 10/02/2022   12:23 PM 06/22/2022    4:05 AM 06/18/2022    4:37 AM  CBC  WBC 4.0 - 10.5 K/uL 7.9  7.4  5.9   Hemoglobin 12.0 - 15.0 g/dL 16.1  09.6  04.5   Hematocrit 36.0 - 46.0 % 38.7  32.1  34.7   Platelets 150 - 400 K/uL 252  278  216       Latest Ref Rng & Units 10/02/2022   12:23 PM 06/22/2022    4:05 AM 06/21/2022    2:23 AM  CMP  Glucose 70 - 99 mg/dL 98  409  811   BUN 8 - 23 mg/dL 11  19  14    Creatinine 0.44 - 1.00 mg/dL 9.14  7.82  9.56   Sodium 135 - 145 mmol/L 137  135  137   Potassium 3.5 - 5.1 mmol/L 3.5  3.6  3.9   Chloride 98 - 111 mmol/L 102  99  99   CO2 22 - 32 mmol/L 26  29  29    Calcium 8.9 - 10.3 mg/dL 8.8  8.4  8.4   Total Protein 6.5 - 8.1 g/dL 7.2     Total Bilirubin 0.3 - 1.2 mg/dL 0.8     Alkaline Phos 38 - 126 U/L 94     AST 15 - 41 U/L 20     ALT 0 - 44 U/L 17        No results found for: "CEA1", "CEA" / No results found for: "CEA1", "CEA" No results found for: "PSA1" No results found for: "OZH086" No results found for: "CAN125"  No results found for: "TOTALPROTELP", "ALBUMINELP", "A1GS", "A2GS", "BETS", "BETA2SER", "GAMS", "MSPIKE", "SPEI" No results found for: "TIBC", "FERRITIN", "IRONPCTSAT" No results found for: "LDH"   STUDIES:   CT Abdomen Pelvis W Contrast  Result Date: 10/02/2022 CLINICAL DATA:  Follow-up anal carcinoma. Previous chemotherapy and radiation therapy. Surveillance. * Tracking Code: BO * EXAM: CT ABDOMEN AND PELVIS WITH CONTRAST TECHNIQUE: Multidetector CT imaging of the abdomen and pelvis was performed using the standard protocol  following bolus administration of intravenous contrast. RADIATION DOSE REDUCTION: This exam was performed according to the departmental dose-optimization program which includes automated exposure control, adjustment of the mA and/or kV according to patient  size and/or use of iterative reconstruction technique. CONTRAST:  OMNIPAQUE IOHEXOL 300 MG/ML  SOLN COMPARISON:  02/03/2022 FINDINGS: Lower Chest: No acute findings. Hepatobiliary: No hepatic masses identified. Gallbladder is unremarkable. No evidence of biliary ductal dilatation. Pancreas:  No mass or inflammatory changes. Spleen: Within normal limits in size and appearance. Adrenals/Urinary Tract: No suspicious masses identified. No evidence of ureteral calculi or hydronephrosis. Stomach/Bowel: Mild diffuse wall thickening of the lower rectum and anus remains stable with stable presacral soft tissue stranding. No focal soft tissue mass identified. No evidence of obstruction, inflammatory process or abnormal fluid collections. Vascular/Lymphatic: No pathologically enlarged lymph nodes. No acute vascular findings. Aortic atherosclerotic calcification incidentally noted. Reproductive:  No mass or other significant abnormality. Other:  None. Musculoskeletal: No suspicious bone lesions identified. Intertrochanteric left hip fracture with internal fixation hardware is now seen. IMPRESSION: No evidence of recurrent or metastatic carcinoma within the abdomen or pelvis. Aortic Atherosclerosis (ICD10-I70.0). Electronically Signed   By: Danae Orleans M.D.   On: 10/02/2022 17:22

## 2022-10-08 NOTE — Patient Instructions (Signed)
[facility]  Discharge Instructions  You were seen and examined today by Dr. Ellin Saba.  Dr. Ellin Saba discussed your most recent lab work which revealed that everything looks good.  Dr. Ellin Saba will repeat CT of your abdomen and pelvis.  Follow-up as scheduled in 6 months.    Thank you for choosing [facility] to provide your oncology and hematology care.   To afford each patient quality time with our provider, please arrive at least 15 minutes before your scheduled appointment time. You may need to reschedule your appointment if you arrive late (10 or more minutes). Arriving late affects you and other patients whose appointments are after yours.  Also, if you miss three or more appointments without notifying the office, you may be dismissed from the clinic at the provider's discretion.    Again, thank you for choosing [facility].  Our hope is that these requests will decrease the amount of time that you wait before being seen by our physicians.   If you have a lab appointment with the Cancer Center - please note that after April 8th, all labs will be drawn in the cancer center.  You do not have to check in or register with the main entrance as you have in the past but will complete your check-in at the cancer center.            _____________________________________________________________  Should you have questions after your visit to [facility], please contact our office at [phone] and follow the prompts.  Our office hours are 8:00 a.m. to 4:30 p.m. Monday - Thursday and 8:00 a.m. to 2:30 p.m. Friday.  Please note that voicemails left after 4:00 p.m. may not be returned until the following business day.  We are closed weekends and all major holidays.  You do have access to a nurse 24-7, just call the main number to the clinic [phone] and do not press any options, hold on the line and a nurse  will answer the phone.    For prescription refill requests, have your pharmacy contact our office and allow 72 hours.    Masks are no longer required in the cancer centers. If you would like for your care team to wear a mask while they are taking care of you, please let them know. You may have one support person who is at least 71 years old accompany you for your appointments.

## 2022-10-13 ENCOUNTER — Encounter: Payer: Self-pay | Admitting: Hematology

## 2022-10-16 ENCOUNTER — Ambulatory Visit (HOSPITAL_COMMUNITY)
Admission: RE | Admit: 2022-10-16 | Discharge: 2022-10-16 | Disposition: A | Discharge status: Home / Self Care | Payer: Medicare HMO | Source: Ambulatory Visit | Attending: Internal Medicine | Admitting: Internal Medicine

## 2022-10-16 DIAGNOSIS — M810 Age-related osteoporosis without current pathological fracture: Secondary | ICD-10-CM | POA: Diagnosis not present

## 2022-10-16 DIAGNOSIS — Z13820 Encounter for screening for osteoporosis: Secondary | ICD-10-CM | POA: Insufficient documentation

## 2022-10-25 IMAGING — CT CT ABD-PELV W/ CM
2 of 5 series · 15 of 46 positions shown, 17 images · IV contrast (Omnipaque or Isovue)
Comparison: 10/23/2019 PET

CLINICAL DATA: Status post chemotherapy and radiation therapy for
anal cancer. Diabetes. Hypertension. Cervical cancer.

EXAM:
CT ABDOMEN AND PELVIS WITH CONTRAST
TECHNIQUE: Multidetector CT imaging of the abdomen and pelvis was performed
using the standard protocol following bolus administration of
intravenous contrast.
CONTRAST:  100mL OMNIPAQUE IOHEXOL 300 MG/ML  SOLN

[Series 2: axial st · axial · 0.83mm/px · z∈[+738,+1178]mm · 12 of 105 slices shown, 14 images]
[im 9/105  soft-tissue]
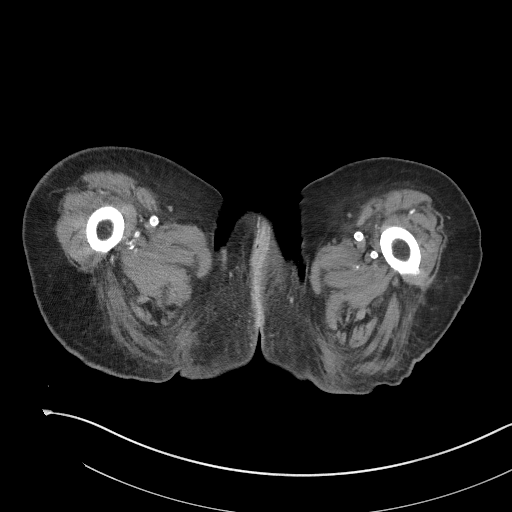
[im 9/105  bone]
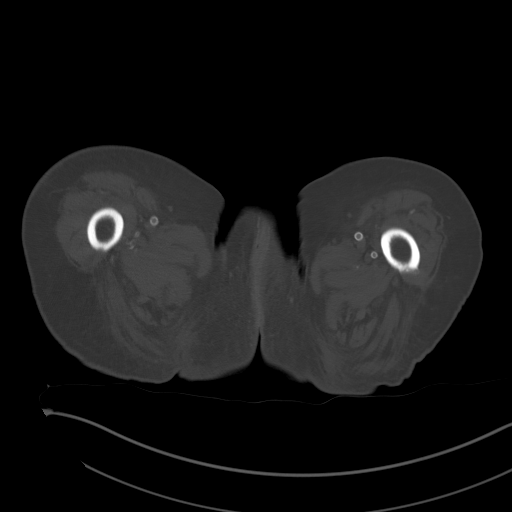
[im 17/105  soft-tissue]
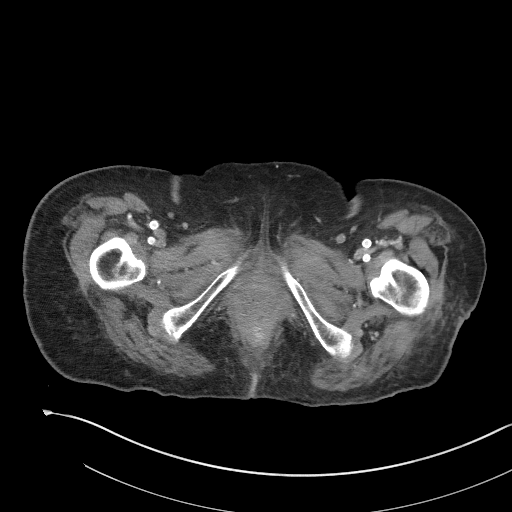
[im 25/105  soft-tissue]
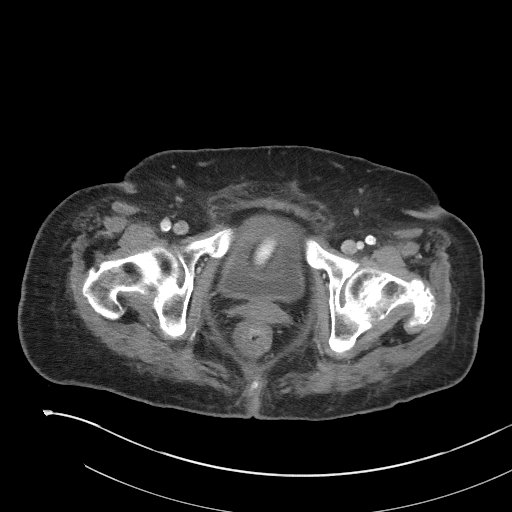
[im 33/105  soft-tissue]
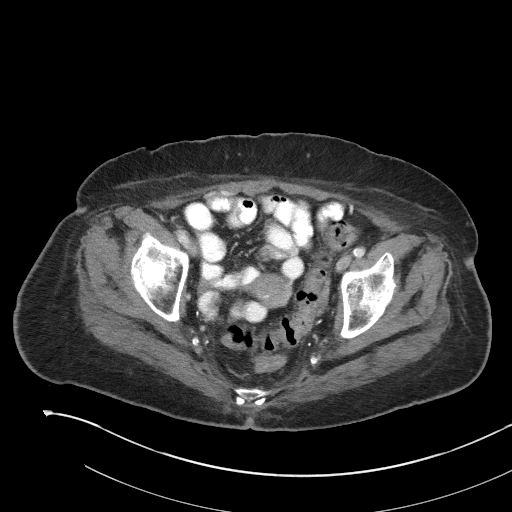
[im 41/105  soft-tissue]
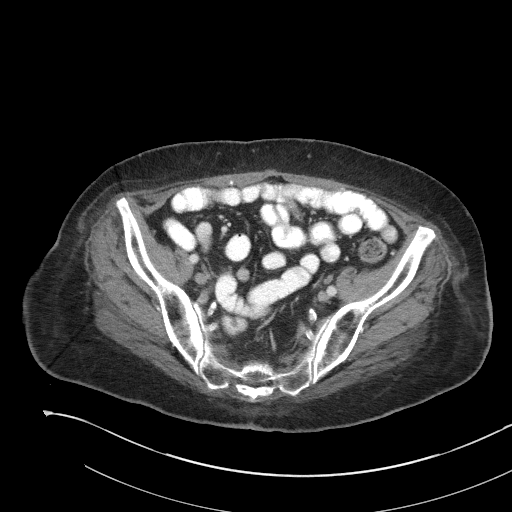
[im 49/105  soft-tissue]
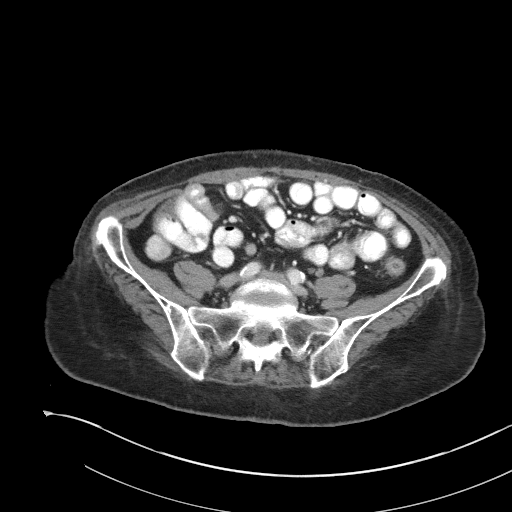
[im 57/105  soft-tissue]
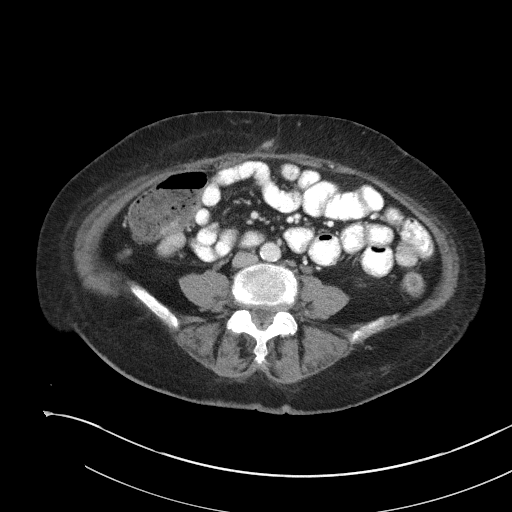
[im 65/105  soft-tissue]
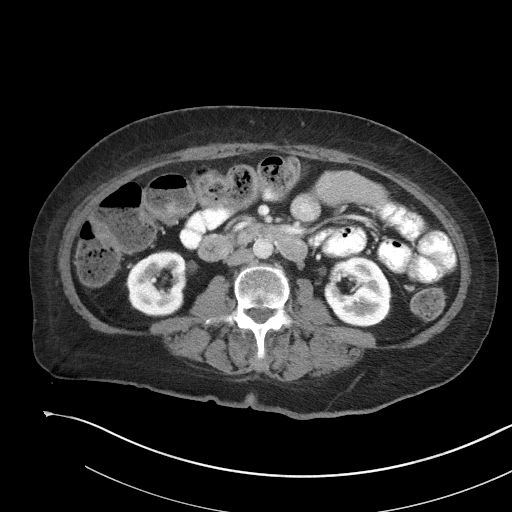
[im 73/105  soft-tissue]
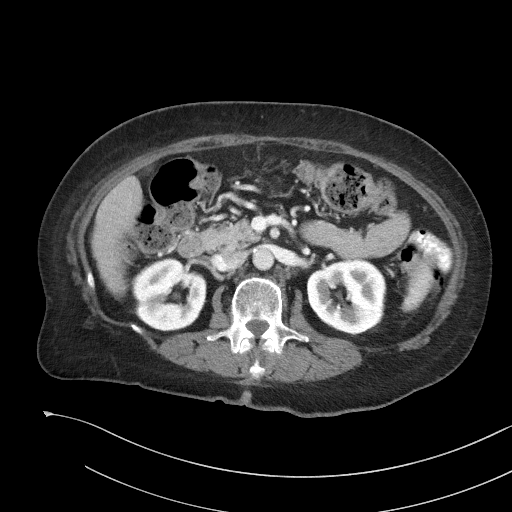
[im 73/105  bone]
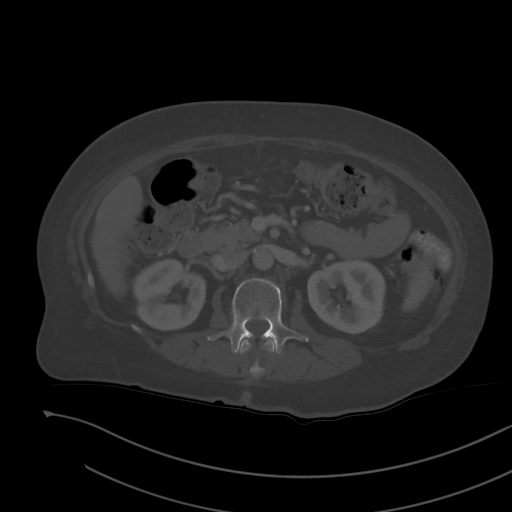
[im 81/105  soft-tissue]
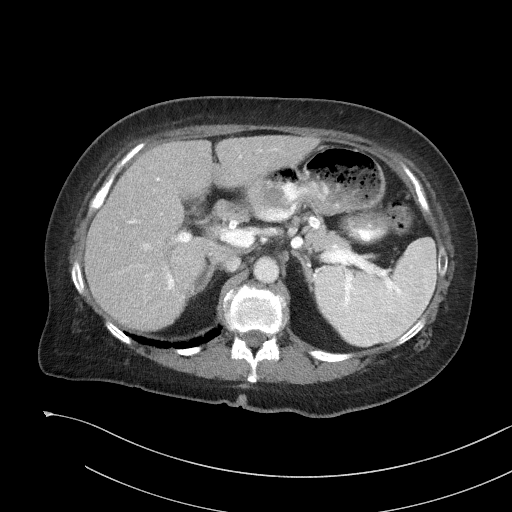
[im 89/105  soft-tissue]
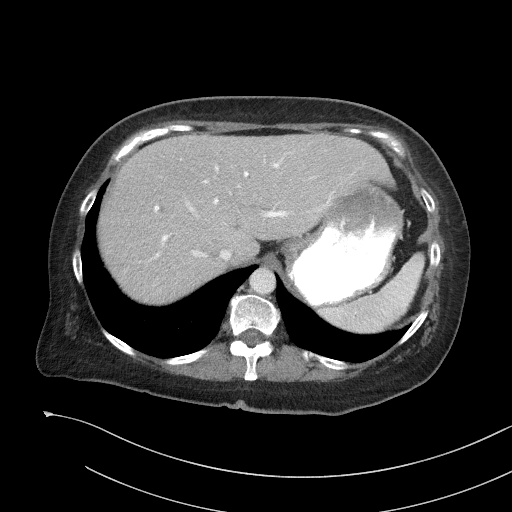
[im 97/105  soft-tissue]
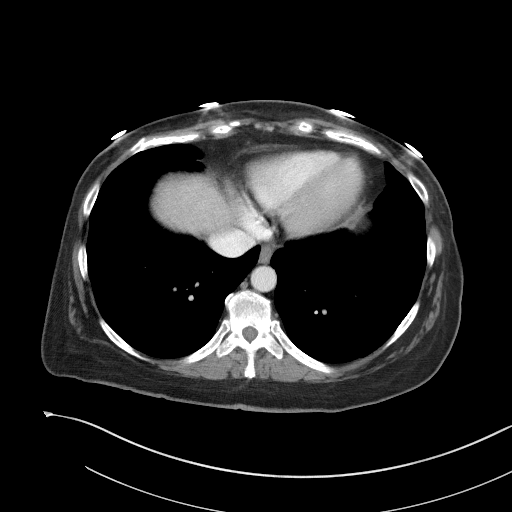

[Series 5: coronal st · coronal · 0.83mm/px · 3 of 98 slices shown]
[im 33/98  soft-tissue]
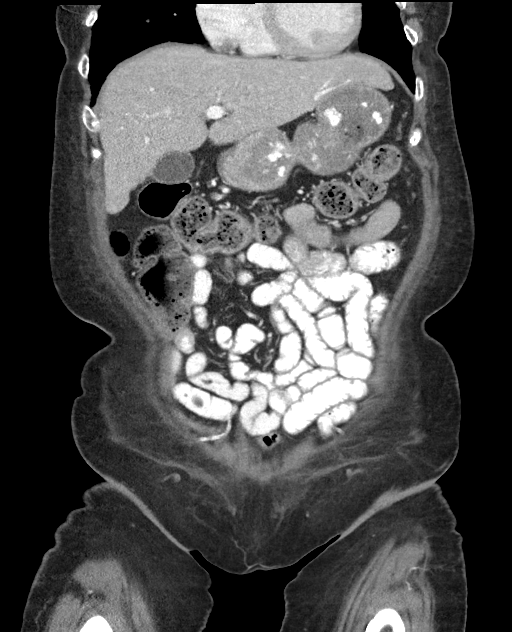
[im 44/98  soft-tissue]
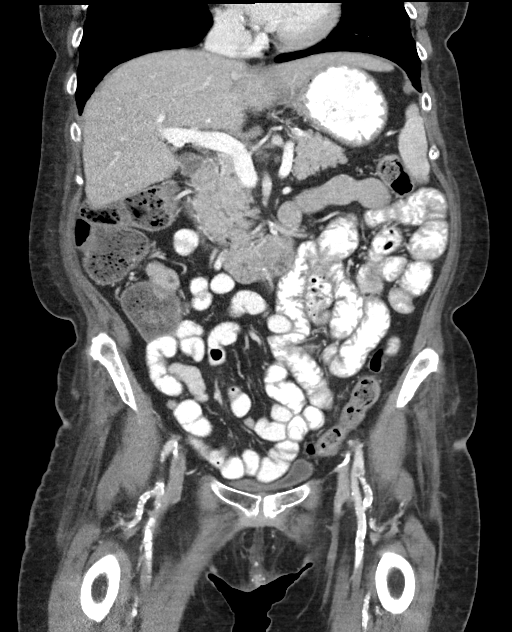
[im 54/98  soft-tissue]
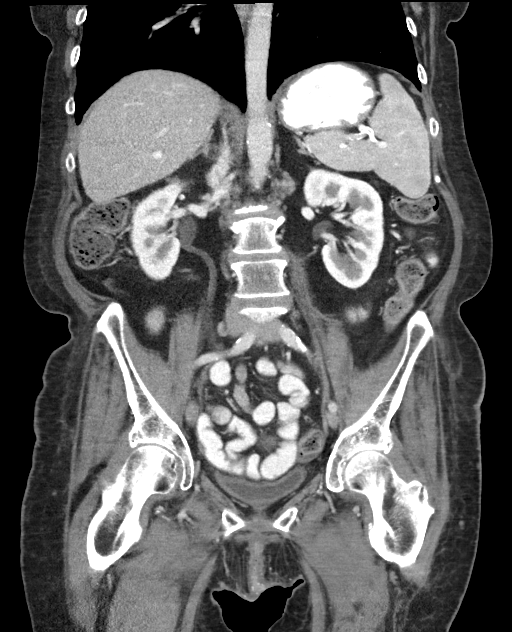

[15 of 46 positions shown; findings below may reference images not displayed]

FINDINGS: Lower chest: Clear lung bases. Normal heart size without pericardial
or pleural effusion.

Hepatobiliary: Mild cirrhosis, as evidenced by caudate enlargement,
medial segment left liver lobe atrophy and a subtle irregular
hepatic capsule. No focal liver lesion. Normal gallbladder, without
biliary ductal dilatation.

Pancreas: Normal, without mass or ductal dilatation.

Spleen: Normal in size, without focal abnormality.

Adrenals/Urinary Tract: Normal adrenal glands. Normal kidneys,
without hydronephrosis. Normal urinary bladder.

Stomach/Bowel: Normal stomach, without wall thickening. No recurrent
well-defined anal rectal primary. Suggestion of mild low rectal wall
thickening including on 81/2, likely treatment related. Colonic
stool burden suggests constipation. Normal terminal ileum and
appendix. Normal small bowel.

Vascular/Lymphatic: Aortic atherosclerosis. Patent splenic, portal,
hepatic veins. No evidence of portal venous hypertension. No
abdominopelvic adenopathy.

Reproductive: Normal uterus and adnexa.

Other: No significant free fluid. No evidence of omental or
peritoneal disease. Mild pelvic floor laxity.

Musculoskeletal: Osteopenia. Posterior left eleventh rib non healed
fracture.
IMPRESSION: 1. No acute process or evidence of metastatic disease in the abdomen
or pelvis.
2. Low rectal wall thickening is mild, most likely treatment
related. No well-defined residual or recurrent mass.
3. Mild cirrhosis.
4.  Possible constipation.
5.  Aortic Atherosclerosis ([id number]).

## 2022-10-27 ENCOUNTER — Ambulatory Visit (INDEPENDENT_AMBULATORY_CARE_PROVIDER_SITE_OTHER): Payer: Medicare HMO | Admitting: Gastroenterology

## 2022-12-16 DIAGNOSIS — E1165 Type 2 diabetes mellitus with hyperglycemia: Secondary | ICD-10-CM | POA: Diagnosis not present

## 2023-01-07 ENCOUNTER — Inpatient Hospital Stay: Payer: Medicare PPO | Attending: Hematology

## 2023-01-07 DIAGNOSIS — Z452 Encounter for adjustment and management of vascular access device: Secondary | ICD-10-CM | POA: Diagnosis not present

## 2023-01-07 DIAGNOSIS — Z85048 Personal history of other malignant neoplasm of rectum, rectosigmoid junction, and anus: Secondary | ICD-10-CM | POA: Diagnosis not present

## 2023-01-07 MED ORDER — SODIUM CHLORIDE 0.9% FLUSH
10.0000 mL | INTRAVENOUS | Status: DC | PRN
  Administered 2023-01-07: 10 mL via INTRAVENOUS

## 2023-01-07 MED ORDER — HEPARIN SOD (PORK) LOCK FLUSH 100 UNIT/ML IV SOLN
500.0000 [IU] | Freq: Once | INTRAVENOUS | Status: AC
  Administered 2023-01-07: 500 [IU] via INTRAVENOUS

## 2023-01-07 NOTE — Progress Notes (Signed)
Jacqueline Bird presented for Portacath access and flush.  Portacath located left chest wall accessed with  H 20 needle.  Good blood return present. Portacath flushed with 20ml NS and 500U/43ml Heparin and needle removed intact. No swelling or bruising noted at the site.  Procedure tolerated well and without incident.     Discharged from clinic ambulatory in stable condition. Alert and oriented x 3. F/U with [facility] as scheduled.

## 2023-01-08 DIAGNOSIS — E1165 Type 2 diabetes mellitus with hyperglycemia: Secondary | ICD-10-CM | POA: Diagnosis not present

## 2023-01-08 DIAGNOSIS — E559 Vitamin D deficiency, unspecified: Secondary | ICD-10-CM | POA: Diagnosis not present

## 2023-01-08 DIAGNOSIS — E539 Vitamin B deficiency, unspecified: Secondary | ICD-10-CM | POA: Diagnosis not present

## 2023-01-08 DIAGNOSIS — E785 Hyperlipidemia, unspecified: Secondary | ICD-10-CM | POA: Diagnosis not present

## 2023-01-14 DIAGNOSIS — I25119 Atherosclerotic heart disease of native coronary artery with unspecified angina pectoris: Secondary | ICD-10-CM | POA: Diagnosis not present

## 2023-01-14 DIAGNOSIS — E785 Hyperlipidemia, unspecified: Secondary | ICD-10-CM | POA: Diagnosis not present

## 2023-01-14 DIAGNOSIS — E559 Vitamin D deficiency, unspecified: Secondary | ICD-10-CM | POA: Diagnosis not present

## 2023-01-14 DIAGNOSIS — E1165 Type 2 diabetes mellitus with hyperglycemia: Secondary | ICD-10-CM | POA: Diagnosis not present

## 2023-01-14 DIAGNOSIS — E539 Vitamin B deficiency, unspecified: Secondary | ICD-10-CM | POA: Diagnosis not present

## 2023-01-14 DIAGNOSIS — G4700 Insomnia, unspecified: Secondary | ICD-10-CM | POA: Diagnosis not present

## 2023-01-14 DIAGNOSIS — C211 Malignant neoplasm of anal canal: Secondary | ICD-10-CM | POA: Diagnosis not present

## 2023-01-14 DIAGNOSIS — I2510 Atherosclerotic heart disease of native coronary artery without angina pectoris: Secondary | ICD-10-CM | POA: Diagnosis not present

## 2023-01-14 DIAGNOSIS — F418 Other specified anxiety disorders: Secondary | ICD-10-CM | POA: Diagnosis not present

## 2023-02-16 ENCOUNTER — Encounter: Payer: Self-pay | Admitting: Internal Medicine

## 2023-03-16 DIAGNOSIS — E1165 Type 2 diabetes mellitus with hyperglycemia: Secondary | ICD-10-CM | POA: Diagnosis not present

## 2023-03-23 DIAGNOSIS — H7292 Unspecified perforation of tympanic membrane, left ear: Secondary | ICD-10-CM | POA: Diagnosis not present

## 2023-03-23 DIAGNOSIS — H6123 Impacted cerumen, bilateral: Secondary | ICD-10-CM | POA: Diagnosis not present

## 2023-03-23 DIAGNOSIS — H903 Sensorineural hearing loss, bilateral: Secondary | ICD-10-CM | POA: Diagnosis not present

## 2023-04-01 ENCOUNTER — Other Ambulatory Visit (HOSPITAL_COMMUNITY): Payer: Medicare HMO

## 2023-04-01 ENCOUNTER — Inpatient Hospital Stay: Payer: Medicare HMO

## 2023-04-08 ENCOUNTER — Inpatient Hospital Stay: Payer: Medicare HMO | Admitting: Hematology

## 2023-04-22 ENCOUNTER — Inpatient Hospital Stay: Payer: Medicare PPO | Attending: Hematology | Admitting: Hematology

## 2023-05-07 DIAGNOSIS — E539 Vitamin B deficiency, unspecified: Secondary | ICD-10-CM | POA: Diagnosis not present

## 2023-05-07 DIAGNOSIS — E785 Hyperlipidemia, unspecified: Secondary | ICD-10-CM | POA: Diagnosis not present

## 2023-05-07 DIAGNOSIS — E559 Vitamin D deficiency, unspecified: Secondary | ICD-10-CM | POA: Diagnosis not present

## 2023-05-07 DIAGNOSIS — E1165 Type 2 diabetes mellitus with hyperglycemia: Secondary | ICD-10-CM | POA: Diagnosis not present

## 2023-05-10 DIAGNOSIS — Z89212 Acquired absence of left upper limb below elbow: Secondary | ICD-10-CM | POA: Diagnosis not present

## 2023-05-14 ENCOUNTER — Encounter (HOSPITAL_COMMUNITY): Payer: Self-pay | Admitting: Nurse Practitioner

## 2023-05-14 ENCOUNTER — Other Ambulatory Visit (HOSPITAL_COMMUNITY): Payer: Self-pay | Admitting: Nurse Practitioner

## 2023-05-14 DIAGNOSIS — G4700 Insomnia, unspecified: Secondary | ICD-10-CM | POA: Diagnosis not present

## 2023-05-14 DIAGNOSIS — F418 Other specified anxiety disorders: Secondary | ICD-10-CM | POA: Diagnosis not present

## 2023-05-14 DIAGNOSIS — R2242 Localized swelling, mass and lump, left lower limb: Secondary | ICD-10-CM | POA: Diagnosis not present

## 2023-05-14 DIAGNOSIS — I25119 Atherosclerotic heart disease of native coronary artery with unspecified angina pectoris: Secondary | ICD-10-CM | POA: Diagnosis not present

## 2023-05-14 DIAGNOSIS — I82409 Acute embolism and thrombosis of unspecified deep veins of unspecified lower extremity: Secondary | ICD-10-CM | POA: Diagnosis not present

## 2023-05-14 DIAGNOSIS — I82402 Acute embolism and thrombosis of unspecified deep veins of left lower extremity: Secondary | ICD-10-CM | POA: Diagnosis not present

## 2023-05-14 DIAGNOSIS — E1165 Type 2 diabetes mellitus with hyperglycemia: Secondary | ICD-10-CM | POA: Diagnosis not present

## 2023-05-14 DIAGNOSIS — C211 Malignant neoplasm of anal canal: Secondary | ICD-10-CM | POA: Diagnosis not present

## 2023-05-14 DIAGNOSIS — E785 Hyperlipidemia, unspecified: Secondary | ICD-10-CM | POA: Diagnosis not present

## 2023-05-18 ENCOUNTER — Ambulatory Visit (HOSPITAL_COMMUNITY)
Admission: RE | Admit: 2023-05-18 | Discharge: 2023-05-18 | Disposition: A | Discharge status: Home / Self Care | Payer: Medicare PPO | Source: Ambulatory Visit | Attending: Nurse Practitioner | Admitting: Nurse Practitioner

## 2023-05-18 DIAGNOSIS — R2242 Localized swelling, mass and lump, left lower limb: Secondary | ICD-10-CM | POA: Insufficient documentation

## 2023-05-18 DIAGNOSIS — M79605 Pain in left leg: Secondary | ICD-10-CM | POA: Diagnosis not present

## 2023-05-18 DIAGNOSIS — R600 Localized edema: Secondary | ICD-10-CM | POA: Diagnosis not present

## 2023-05-18 DIAGNOSIS — I82409 Acute embolism and thrombosis of unspecified deep veins of unspecified lower extremity: Secondary | ICD-10-CM | POA: Diagnosis not present

## 2023-05-21 ENCOUNTER — Emergency Department (HOSPITAL_COMMUNITY): Payer: HMO

## 2023-05-21 ENCOUNTER — Other Ambulatory Visit: Payer: Self-pay

## 2023-05-21 ENCOUNTER — Observation Stay (HOSPITAL_COMMUNITY)
Admission: EM | Admit: 2023-05-21 | Discharge: 2023-05-22 | Disposition: A | Discharge status: Home / Self Care | Payer: HMO | Attending: Family Medicine | Admitting: Family Medicine

## 2023-05-21 DIAGNOSIS — I82412 Acute embolism and thrombosis of left femoral vein: Principal | ICD-10-CM | POA: Insufficient documentation

## 2023-05-21 DIAGNOSIS — Z794 Long term (current) use of insulin: Secondary | ICD-10-CM | POA: Diagnosis not present

## 2023-05-21 DIAGNOSIS — I5032 Chronic diastolic (congestive) heart failure: Secondary | ICD-10-CM | POA: Insufficient documentation

## 2023-05-21 DIAGNOSIS — E119 Type 2 diabetes mellitus without complications: Secondary | ICD-10-CM | POA: Diagnosis not present

## 2023-05-21 DIAGNOSIS — Z79899 Other long term (current) drug therapy: Secondary | ICD-10-CM | POA: Insufficient documentation

## 2023-05-21 DIAGNOSIS — I82409 Acute embolism and thrombosis of unspecified deep veins of unspecified lower extremity: Secondary | ICD-10-CM | POA: Diagnosis present

## 2023-05-21 DIAGNOSIS — Z7722 Contact with and (suspected) exposure to environmental tobacco smoke (acute) (chronic): Secondary | ICD-10-CM | POA: Diagnosis not present

## 2023-05-21 DIAGNOSIS — Z89112 Acquired absence of left hand: Secondary | ICD-10-CM | POA: Insufficient documentation

## 2023-05-21 DIAGNOSIS — Z8541 Personal history of malignant neoplasm of cervix uteri: Secondary | ICD-10-CM | POA: Insufficient documentation

## 2023-05-21 DIAGNOSIS — M7989 Other specified soft tissue disorders: Secondary | ICD-10-CM | POA: Diagnosis not present

## 2023-05-21 DIAGNOSIS — Z7901 Long term (current) use of anticoagulants: Secondary | ICD-10-CM | POA: Diagnosis not present

## 2023-05-21 DIAGNOSIS — E876 Hypokalemia: Secondary | ICD-10-CM | POA: Insufficient documentation

## 2023-05-21 DIAGNOSIS — Z85828 Personal history of other malignant neoplasm of skin: Secondary | ICD-10-CM | POA: Insufficient documentation

## 2023-05-21 DIAGNOSIS — R600 Localized edema: Secondary | ICD-10-CM | POA: Diagnosis not present

## 2023-05-21 DIAGNOSIS — Z7984 Long term (current) use of oral hypoglycemic drugs: Secondary | ICD-10-CM | POA: Insufficient documentation

## 2023-05-21 DIAGNOSIS — I110 Hypertensive heart disease with heart failure: Secondary | ICD-10-CM | POA: Diagnosis not present

## 2023-05-21 DIAGNOSIS — M79605 Pain in left leg: Secondary | ICD-10-CM | POA: Diagnosis not present

## 2023-05-21 LAB — CBC WITH DIFFERENTIAL/PLATELET
Abs Immature Granulocytes: 0.03 10*3/uL (ref 0.00–0.07)
Basophils Absolute: 0 10*3/uL (ref 0.0–0.1)
Basophils Relative: 1 %
Eosinophils Absolute: 0 10*3/uL (ref 0.0–0.5)
Eosinophils Relative: 1 %
HCT: 39.1 % (ref 36.0–46.0)
Hemoglobin: 12 g/dL (ref 12.0–15.0)
Immature Granulocytes: 0 %
Lymphocytes Relative: 12 %
Lymphs Abs: 1.1 10*3/uL (ref 0.7–4.0)
MCH: 27 pg (ref 26.0–34.0)
MCHC: 30.7 g/dL (ref 30.0–36.0)
MCV: 87.9 fL (ref 80.0–100.0)
Monocytes Absolute: 0.5 10*3/uL (ref 0.1–1.0)
Monocytes Relative: 6 %
Neutro Abs: 6.9 10*3/uL (ref 1.7–7.7)
Neutrophils Relative %: 80 %
Platelets: 369 10*3/uL (ref 150–400)
RBC: 4.45 MIL/uL (ref 3.87–5.11)
RDW: 14.9 % (ref 11.5–15.5)
WBC: 8.6 10*3/uL (ref 4.0–10.5)
nRBC: 0 % (ref 0.0–0.2)

## 2023-05-21 LAB — COMPREHENSIVE METABOLIC PANEL
ALT: 23 U/L (ref 0–44)
AST: 27 U/L (ref 15–41)
Albumin: 2.8 g/dL — ABNORMAL LOW (ref 3.5–5.0)
Alkaline Phosphatase: 112 U/L (ref 38–126)
Anion gap: 8 (ref 5–15)
BUN: 12 mg/dL (ref 8–23)
CO2: 28 mmol/L (ref 22–32)
Calcium: 8.6 mg/dL — ABNORMAL LOW (ref 8.9–10.3)
Chloride: 101 mmol/L (ref 98–111)
Creatinine, Ser: 0.52 mg/dL (ref 0.44–1.00)
GFR, Estimated: >60 mL/min (ref >60)
Glucose, Bld: 101 mg/dL — ABNORMAL HIGH (ref 70–99)
Potassium: 3 mmol/L — ABNORMAL LOW (ref 3.5–5.1)
Sodium: 137 mmol/L (ref 135–145)
Total Bilirubin: 0.6 mg/dL (ref 0.0–1.2)
Total Protein: 6.8 g/dL (ref 6.5–8.1)

## 2023-05-21 LAB — PROTIME-INR
INR: 1.3 — ABNORMAL HIGH (ref 0.8–1.2)
Prothrombin Time: 16.2 s — ABNORMAL HIGH (ref 11.4–15.2)

## 2023-05-21 LAB — APTT: aPTT: 45 s — ABNORMAL HIGH (ref 24–36)

## 2023-05-21 MED ORDER — ACETAMINOPHEN 500 MG PO TABS
1000.0000 mg | ORAL_TABLET | Freq: Four times a day (QID) | ORAL | Status: DC
  Administered 2023-05-22 (×3): 1000 mg via ORAL
  Filled 2023-05-21 (×3): qty 2

## 2023-05-21 MED ORDER — HEPARIN (PORCINE) 25000 UT/250ML-% IV SOLN
1100.0000 [IU]/h | INTRAVENOUS | Status: DC
  Filled 2023-05-21: qty 250

## 2023-05-21 MED ORDER — ENOXAPARIN (LOVENOX) PATIENT EDUCATION KIT
PACK | Freq: Once | Status: AC
  Filled 2023-05-21: qty 1

## 2023-05-21 MED ORDER — GABAPENTIN 100 MG PO CAPS
200.0000 mg | ORAL_CAPSULE | Freq: Every day | ORAL | Status: DC
  Administered 2023-05-22: 200 mg via ORAL
  Filled 2023-05-21: qty 2

## 2023-05-21 MED ORDER — ENOXAPARIN SODIUM 80 MG/0.8ML IJ SOSY
1.0000 mg/kg | PREFILLED_SYRINGE | Freq: Two times a day (BID) | INTRAMUSCULAR | Status: DC
  Administered 2023-05-22: 65 mg via SUBCUTANEOUS
  Filled 2023-05-21: qty 0.8

## 2023-05-21 MED ORDER — INSULIN GLARGINE-YFGN 100 UNIT/ML ~~LOC~~ SOLN
10.0000 [IU] | Freq: Every day | SUBCUTANEOUS | Status: DC
  Administered 2023-05-22: 10 [IU] via SUBCUTANEOUS
  Filled 2023-05-21 (×2): qty 0.1

## 2023-05-21 MED ORDER — MELATONIN 3 MG PO TABS: 6.0000 mg | ORAL_TABLET | Freq: Every evening | ORAL | Status: DC | PRN

## 2023-05-21 MED ORDER — SERTRALINE HCL 50 MG PO TABS
125.0000 mg | ORAL_TABLET | Freq: Every day | ORAL | Status: DC
  Administered 2023-05-22: 125 mg via ORAL
  Filled 2023-05-21: qty 3

## 2023-05-21 MED ORDER — OXYCODONE HCL 5 MG PO TABS: 2.5000 mg | ORAL_TABLET | ORAL | Status: DC | PRN

## 2023-05-21 MED ORDER — HEPARIN BOLUS VIA INFUSION: 4500.0000 [IU] | Freq: Once | INTRAVENOUS | Status: DC

## 2023-05-21 MED ORDER — GABAPENTIN 100 MG PO CAPS
100.0000 mg | ORAL_CAPSULE | ORAL | Status: DC
Start: 2023-05-21 — End: 2023-05-21

## 2023-05-21 MED ORDER — MORPHINE SULFATE (PF) 4 MG/ML IV SOLN
4.0000 mg | Freq: Once | INTRAVENOUS | Status: AC
  Administered 2023-05-21: 4 mg via INTRAVENOUS
  Filled 2023-05-21: qty 1

## 2023-05-21 MED ORDER — GABAPENTIN 100 MG PO CAPS
100.0000 mg | ORAL_CAPSULE | Freq: Every day | ORAL | Status: DC
  Administered 2023-05-22: 100 mg via ORAL
  Filled 2023-05-21: qty 1

## 2023-05-21 MED ORDER — HYDROMORPHONE HCL 1 MG/ML IJ SOLN: 0.5000 mg | INTRAMUSCULAR | Status: DC | PRN

## 2023-05-21 MED ORDER — POTASSIUM CHLORIDE CRYS ER 20 MEQ PO TBCR
40.0000 meq | EXTENDED_RELEASE_TABLET | Freq: Once | ORAL | Status: AC
  Administered 2023-05-22: 40 meq via ORAL
  Filled 2023-05-21: qty 2

## 2023-05-21 MED ORDER — ALPRAZOLAM 0.5 MG PO TABS
0.5000 mg | ORAL_TABLET | Freq: Every day | ORAL | Status: DC
  Administered 2023-05-22: 0.5 mg via ORAL
  Filled 2023-05-21: qty 1

## 2023-05-21 MED ORDER — TRAZODONE HCL 50 MG PO TABS
100.0000 mg | ORAL_TABLET | Freq: Every day | ORAL | Status: DC
  Administered 2023-05-22: 100 mg via ORAL
  Filled 2023-05-21: qty 2

## 2023-05-21 MED ORDER — ATORVASTATIN CALCIUM 40 MG PO TABS: 80.0000 mg | ORAL_TABLET | Freq: Every evening | ORAL | Status: DC

## 2023-05-21 MED ORDER — INSULIN ASPART 100 UNIT/ML IJ SOLN: 0.0000 [IU] | Freq: Three times a day (TID) | INTRAMUSCULAR | Status: DC

## 2023-05-21 MED ORDER — OXYCODONE HCL 5 MG PO TABS: 5.0000 mg | ORAL_TABLET | ORAL | Status: DC | PRN

## 2023-05-21 MED ORDER — OXYCODONE HCL 5 MG PO TABS
5.0000 mg | ORAL_TABLET | Freq: Once | ORAL | Status: AC
  Administered 2023-05-21: 5 mg via ORAL
  Filled 2023-05-21: qty 1

## 2023-05-21 NOTE — ED Provider Notes (Signed)
 [place] EMERGENCY DEPARTMENT AT [facility] Provider Note   CSN: [id number] Arrival date & time: 05/21/23  1542     History  Chief Complaint  Patient presents with   Leg Swelling    Jacqueline Bird is a 73 y.o. female with PMH as listed below who presents with leg swelling/pain.   Pt stated that she was recently seen here for a suspected blood clot in her left upper thigh area, but according to pt, the wrong area of her leg was examined and she thinks that the blood clot was missed. She reports that 3 weeks ago she sprained her left ankle and developed L ankle swelling, but then the swelling progressed and pain persisted. Patient reports significant pain, redness and swelling in her left leg, particularly in upper medial thigh. Patient is on eliquis  for h/o unprovoked LLE DVT, hasn't missed any doses. She denies f/c, CP, SOB, cough. She reports extreme difficulty with ambulation d/t pain and has been utilizing a walker for the last few days, dragging her leg trying to get to the bathroom.  Per chart review, DVT LLE on 05/18/23 notes no e/o DVT. Patient states she feels that the ultrasonographer did not evaluate the correct area. No f/c, no CP/SOB/cough/lightheadedness/syncope.  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 Authorizing Provider  acetaminophen  (TYLENOL ) 500 MG tablet Take 500-1,000 mg by mouth every 6 (six) hours as needed for moderate pain.   Yes [provider]  ALPRAZolam  (XANAX ) 0.5 MG tablet Take 1 tablet (0.5 mg total) by mouth at bedtime. 06/23/22   Yes Samtani, Jai-Gurmukh, MD  atorvastatin  (LIPITOR ) 80 MG tablet Take 80 mg by mouth every evening.  11/29/19  Yes [provider]  FARXIGA  10 MG TABS tablet Take 1 tablet (10 mg total) by mouth daily. 06/23/22  Yes Samtani, Jai-Gurmukh, MD  gabapentin  (NEURONTIN ) 100 MG capsule Take 1-2 capsules (100-200 mg total) by mouth See admin instructions. Take 200 mg by mouth in the morning and take 100 mg at night. 06/23/22  Yes Samtani, Jai-Gurmukh, MD  NOVOLOG  FLEXPEN 100 UNIT/ML FlexPen Inject into the skin 3 (three) times daily with meals. 04/06/23  Yes [provider]  sertraline  (ZOLOFT ) 100 MG tablet Take 1 tablet (100 mg total) by mouth daily. Patient taking differently: Take 100 mg by mouth daily. Taking with 25 mg to equal total of 125 mg 06/23/22  Yes Samtani, Jai-Gurmukh, MD  sertraline  (ZOLOFT ) 25 MG tablet Take 25 mg by mouth daily. Taking with 100 mg, to equal total dose of 125 mg   Yes [provider]  traZODone  (DESYREL ) 50 MG tablet Take 2 tablets (100 mg total) by mouth at bedtime. 06/23/22  Yes Samtani, Jai-Gurmukh, MD  TRESIBA  FLEXTOUCH 200 UNIT/ML FlexTouch Pen Inject 15 Units into the skin every morning. 04/06/23  Yes [provider]  TRULICITY  1.5 MG/0.5ML SOAJ Inject 1.5 mg into the skin every Sunday. 04/13/23  Yes [provider]  enoxaparin  (LOVENOX ) 60 MG/0.6ML injection  Inject 0.6 mLs (60 mg total) into the skin every 12 (twelve) hours. 05/22/23 09/19/23  Willette Adriana LABOR, MD      Allergies    Sulfa antibiotics    Review of Systems   Review of Systems A 10 point review of systems was performed and is negative unless otherwise reported in HPI.  Physical Exam Updated Vital Signs BP (!) 110/50 (BP Location: Right Arm)   Pulse 78   Temp 98.2 F (36.8 C)   Resp 15   Ht 5' 4.5 (1.638 m)   Wt 62.4 kg   SpO2 100%   BMI 23.25 kg/m  Physical Exam General: Uncomfortable appearing female, lying in bed, writhing around.  HEENT: Sclera  anicteric, MMM, trachea midline.  Cardiology: RRR, no murmurs/rubs/gallops. Resp: Normal respiratory rate and effort. CTAB, no wheezes, rhonchi, crackles.  Abd: Soft, non-tender, non-distended. No rebound tenderness or guarding.  GU: Deferred. MSK: Asymmetric left lower extremity edema 2+ up to the thigh, slightly indurated in the groin, with scattered erythema. Distally neurovascularly intact with intact DP/PT pulses bilaterally. No succinct ankle or knee effusion. Intact ROM of L ankle/knee/hip though with pain diffusely. S/p remote traumatic amputation of L hand.  Skin: warm, dry. Neuro: A&Ox4, CNs II-XII grossly intact. MAEs. Sensation grossly intact.  Psych: Normal mood and affect.   ED Results / Procedures / Treatments   Labs (all labs ordered are listed, but only abnormal results are displayed) Labs Reviewed  COMPREHENSIVE METABOLIC PANEL - Abnormal; Notable for the following components:      Result Value   Potassium 3.0 (*)    Glucose, Bld 101 (*)    Calcium  8.6 (*)    Albumin 2.8 (*)    All other components within normal limits  PROTIME-INR - Abnormal; Notable for the following components:   Prothrombin Time 16.2 (*)    INR 1.3 (*)    All other components within normal limits  APTT - Abnormal; Notable for the following components:   aPTT 45 (*)    All other components within normal limits  GLUCOSE, CAPILLARY - Abnormal; Notable for the following components:   Glucose-Capillary 106 (*)    All other components within normal limits  GLUCOSE, CAPILLARY - Abnormal; Notable for the following components:   Glucose-Capillary 131 (*)    All other components within normal limits  CBC WITH DIFFERENTIAL/PLATELET    EKG None  Radiology US  Venous Img Lower  Left (DVT Study) Result Date: 05/21/2023 CLINICAL DATA:  Leg swelling and pain EXAM: LEFT LOWER EXTREMITY VENOUS DOPPLER ULTRASOUND TECHNIQUE: Gray-scale sonography with compression, as well as color and duplex ultrasound, were  performed to evaluate the deep venous system(s) from the level of the common femoral vein through the popliteal and proximal calf veins. COMPARISON:  Left lower extremity venous Doppler ultrasound 05/18/2023. FINDINGS: VENOUS There is a occlusive noncompressible thrombus throughout the femoral vein. Normal compressibility of the common femoral, profunda femoral, and popliteal veins, as well as the visualized calf veins. Note is made that the peroneal vein is not visualized. Visualized portions of profunda femoral vein and great saphenous vein unremarkable. Limited views of the contralateral common femoral vein are unremarkable. OTHER Diffuse lower extremity edema present. Limitations: none IMPRESSION: Occlusive deep venous thrombosis throughout the left femoral vein. Electronically Signed   By: Greig Pique M.D.   On: 05/21/2023 20:49    Procedures Procedures    Medications Ordered in ED Medications  oxyCODONE  (Oxy IR/ROXICODONE ) immediate release tablet 5 mg (5 mg Oral  Given 05/21/23 2125)  enoxaparin  (LOVENOX ) patient education kit ( Does not apply Given 05/22/23 1231)  morphine  (PF) 4 MG/ML injection 4 mg (4 mg Intravenous Given 05/21/23 2205)  potassium chloride  SA (KLOR-CON  M) CR tablet 40 mEq (40 mEq Oral Given 05/22/23 0107)    ED Course/ Medical Decision Making/ A&P                          Medical Decision Making Amount and/or Complexity of Data Reviewed Labs: ordered. Decision-making details documented in ED Course. Radiology:  Decision-making details documented in ED Course.  Risk Prescription drug management. Decision regarding hospitalization.    This patient presents to the ED for concern of L leg pain/swelling, this involves an extensive number of treatment options, and is a complaint that carries with it a high risk of complications and morbidity.  I considered the following differential and admission for this acute, potentially life threatening condition.   MDM:    Patient  with diffuse swelling and pain of the left lower extremity particular the area of the left medial thigh.  On discussing with the patient and having her demonstrate where she was ultrasounded and where her pain is, there is some concern that maybe she does have persistent DVT.  Ordered an additional left lower extremity ultrasound which did demonstrate an occlusive DVT in the left femoral vein. Patient already on eliquis  and will need different anticoagulation like heparin  or lovenox .    Clinical Course as of 05/23/23 1503  Fri May 21, 2023  2054 CBC with Differential wnl [HN]  2054 US  Venous Img Lower  Left (DVT Study) Occlusive deep venous thrombosis throughout the left femoral vein. [HN]  2130 D/w hospitalist Dr. Keturah who queries potential lovenox  and DC. Will consult to heme. [HN]  2306 Hematology had relayed that Lovenox  and discharge would be acceptable.  Did not need any washout of Eliquis  and can just immediately start Lovenox .  Attempted to discharge patient however she is in too much pain to stand.  She told me that she has just been hopping and dragging her leg at home.  Attempted pain control with Oxy and subsequently morphine  IV, however patient is still intermittently yelling out in pain with movement of the leg and cannot stand or pivot by herself.  Patient lives independently and I do not feel she would be a safe discharge.  Patient will be admitted for obs with Dr. Segars. [HN]    Clinical Course User Index [HN] Franklyn Sid SAILOR, MD    Labs: I Ordered, and personally interpreted labs.  The pertinent results include:  those listed above  Imaging Studies ordered: I ordered imaging studies including LLE DVT US  I independently visualized and interpreted imaging. I agree with the radiologist interpretation  Additional history obtained from chart review.    Cardiac Monitoring: The patient was maintained on a cardiac monitor.  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   Cervical cancer (HCC)    Depression  DVT (deep venous thrombosis) (HCC)    Essential hypertension    History of cervical cancer    Mixed hyperlipidemia    Type 2 diabetes mellitus (HCC)      Medicines Meds ordered this encounter  Medications   oxyCODONE  (Oxy IR/ROXICODONE ) immediate release tablet 5 mg    Refill:  0   DISCONTD: heparin  ADULT infusion 100 units/mL (25000 units/250mL)   DISCONTD: heparin  bolus via infusion 4,500 Units   enoxaparin  (LOVENOX ) patient education kit   DISCONTD: enoxaparin  (LOVENOX ) injection 65 mg   morphine  (PF) 4 MG/ML injection 4 mg    Refill:  0   DISCONTD: ALPRAZolam  (XANAX ) tablet 0.5 mg   DISCONTD: atorvastatin  (LIPITOR ) tablet 80 mg   DISCONTD: gabapentin  (NEURONTIN ) capsule 100-200 mg    Take 200 mg by mouth in the morning and take 100 mg at night.     DISCONTD: sertraline  (ZOLOFT ) tablet 125 mg   DISCONTD: traZODone  (DESYREL ) tablet 100 mg   DISCONTD: insulin  glargine-yfgn (SEMGLEE ) injection 10 Units   DISCONTD: insulin  aspart (novoLOG ) injection 0-6 Units    Correction coverage::   Very Sensitive (ESRD/Dialysis)    CBG < 70::   Implement Hypoglycemia Standing Orders and refer to Hypoglycemia Standing Orders sidebar report    CBG 70 - 120::   0 units    CBG 121 - 150::   0 units    CBG 151 - 200::   1 unit    CBG 201-250::   2 units    CBG 251-300::   3 units    CBG 301-350::   4 units    CBG 351-400::   5 units    CBG > 400:   Give 6 units and call MD   potassium  chloride SA (KLOR-CON  M) CR tablet 40 mEq   DISCONTD: gabapentin  (NEURONTIN ) capsule 200 mg   DISCONTD: gabapentin  (NEURONTIN ) capsule 100 mg   DISCONTD: acetaminophen  (TYLENOL ) tablet 1,000 mg   DISCONTD: oxyCODONE  (Oxy IR/ROXICODONE ) immediate release tablet 2.5 mg    Refill:  0   DISCONTD: HYDROmorphone  (DILAUDID ) injection 0.5 mg   DISCONTD: oxyCODONE  (Oxy IR/ROXICODONE ) immediate release tablet 5 mg    Refill:  0   DISCONTD: melatonin tablet 6 mg   DISCONTD: enoxaparin  (LOVENOX ) injection 60 mg   DISCONTD: Chlorhexidine  Gluconate Cloth 2 % PADS 6 each   enoxaparin  (LOVENOX ) 60 MG/0.6ML injection    Sig: Inject 0.6 mLs (60 mg total) into the skin every 12 (twelve) hours.    Dispense:  36 mL    Refill:  3   heparin  lock flush 100 unit/mL    I have reviewed the patients home medicines and have made adjustments as needed  Problem List / ED Course: Problem List Items Addressed This Visit   None Visit Diagnoses       DVT of deep femoral vein, left (HCC)    -  Primary   Relevant Medications   enoxaparin  (LOVENOX ) 60 MG/0.6ML injection   heparin  lock flush 100 unit/mL (Completed)                   This note was created using dictation software, which may contain spelling or grammatical errors.    Franklyn Sid SAILOR, MD 05/23/23 [phone]

## 2023-05-21 NOTE — Consult Note (Signed)
 PHARMACY - ANTICOAGULATION CONSULT NOTE  Pharmacy Consult for Heparin  Indication:  occlusive LLE DVT  Allergies  Allergen Reactions   Sulfa Antibiotics Anaphylaxis and Swelling    Per pt, can use creams with sulfa in it Only externally tolerated but not internally    Patient Measurements: Height: 5' 4.5 (163.8 cm) Weight: 64 kg (141 lb) IBW/kg (Calculated) : 55.85 Heparin  Dosing Weight: 64 kg  Vital Signs: Temp: 98.8 F (37.1 C) (01/03 1602) Temp Source: Oral (01/03 1602) BP: 150/58 (01/03 1900) Pulse Rate: 92 (01/03 1900)  Labs: Recent Labs    05/21/23 2035  HGB 12.0  HCT 39.1  PLT 369  APTT 45*  LABPROT 16.2*  INR 1.3*  CREATININE 0.52    Estimated Creatinine Clearance: 56.9 mL/min (by C-G formula based on SCr of 0.52 mg/dL).   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 Last dose: 05/21/23@~1200  Assessment: 73 y.o. female presents to the ED with leg swelling/pain. Pt stated that she was recently seen here for a suspected blood clot in her left upper thigh area, but according to pt, the wrong area of her leg was examined and blood clot was missed. Pt has very obvious swelling in her left upper leg and has pain at site as well. Pt on eliquis  for h/o unprovoked LLE DVT, hasn't missed any doses.   Baseline labs: aPTT 45, INR 1.3, Hgb 12.0, Plts 369  Goal of Therapy:  Heparin  level 0.3-0.7 units/ml aPTT 66-102 seconds Monitor platelets by anticoagulation protocol: Yes   Plan:  Give 4500 units bolus x 1 Start heparin  infusion at 1100 units/hr Check both aPTT and anti-Xa level in 8 hours with  morning labs Will continue to use aPTT levels to adjust dosing, and transition to anti-Xa levels once both levels correlate Continue to monitor H&H and platelets  Jacqueline Bird, PharmD Clinical Pharmacist 05/21/2023 9:21 PM

## 2023-05-21 NOTE — ED Triage Notes (Signed)
 Pt stated that she was recently seen here for a suspected blood clot in her left upper thigh area, but according to pt, the wrong area of her leg was examined and blood clot was missed. Pt has very obvious swelling in her left upper leg and has pain at site as well. Pt on blood thinners

## 2023-05-22 DIAGNOSIS — I82412 Acute embolism and thrombosis of left femoral vein: Secondary | ICD-10-CM | POA: Diagnosis not present

## 2023-05-22 LAB — GLUCOSE, CAPILLARY
Glucose-Capillary: 106 mg/dL — ABNORMAL HIGH (ref 70–99)
Glucose-Capillary: 131 mg/dL — ABNORMAL HIGH (ref 70–99)

## 2023-05-22 MED ORDER — HEPARIN SOD (PORK) LOCK FLUSH 100 UNIT/ML IV SOLN
500.0000 [IU] | Freq: Once | INTRAVENOUS | Status: AC
  Administered 2023-05-22: 500 [IU] via INTRAVENOUS
  Filled 2023-05-22: qty 5

## 2023-05-22 MED ORDER — ENOXAPARIN SODIUM 60 MG/0.6ML IJ SOSY: 60.0000 mg | PREFILLED_SYRINGE | Freq: Two times a day (BID) | INTRAMUSCULAR | 3 refills | Status: AC

## 2023-05-22 MED ORDER — CHLORHEXIDINE GLUCONATE CLOTH 2 % EX PADS
6.0000 | MEDICATED_PAD | Freq: Every day | CUTANEOUS | Status: DC
  Administered 2023-05-22: 6 via TOPICAL

## 2023-05-22 MED ORDER — ENOXAPARIN SODIUM 60 MG/0.6ML IJ SOSY
60.0000 mg | PREFILLED_SYRINGE | Freq: Two times a day (BID) | INTRAMUSCULAR | Status: DC
  Administered 2023-05-22: 60 mg via SUBCUTANEOUS
  Filled 2023-05-22 (×2): qty 0.6

## 2023-05-22 NOTE — Progress Notes (Addendum)
   05/22/23 1408  TOC Brief Assessment  Insurance and Status Reviewed  Patient has primary care physician Yes  Home environment has been reviewed Home  Prior level of function: Independent  Prior/Current Home Services No current home services  Social Drivers of Health Review SDOH reviewed no interventions necessary  Readmission risk has been reviewed Yes  Transition of care needs transition of care needs identified, TOC will continue to follow   CSW visited pt at bedside prior to DC.  Pt is receptive to a HHPT referral and services.  CSW [place] to MD and requested order, advising that we would attempt a referral to local providers. CSW sent a request to [facility].  TOC will monitor referral attempts and follow up with pt today on findings- pt will have DC. No further TOC needs.  Addendum: MD agreed to enter order, [facility] agreed to accept pt, soc Wednesday this week.

## 2023-05-22 NOTE — Plan of Care (Signed)
  Problem: Education: Goal: Knowledge of General Education information will improve Description: Including pain rating scale, medication(s)/side effects and non-pharmacologic comfort measures Outcome: Progressing   Problem: Health Behavior/Discharge Planning: Goal: Ability to manage health-related needs will improve Outcome: Progressing   Problem: Clinical Measurements: Goal: Ability to maintain clinical measurements within normal limits will improve Outcome: Progressing Goal: Will remain free from infection Outcome: Progressing Goal: Respiratory complications will improve Outcome: Progressing Goal: Cardiovascular complication will be avoided Outcome: Progressing   Problem: Activity: Goal: Risk for activity intolerance will decrease Outcome: Progressing   Problem: Nutrition: Goal: Adequate nutrition will be maintained Outcome: Progressing   Problem: Coping: Goal: Level of anxiety will decrease Outcome: Progressing   Problem: Elimination: Goal: Will not experience complications related to bowel motility Outcome: Progressing   Problem: Pain Management: Goal: General experience of comfort will improve Outcome: Progressing   Problem: Safety: Goal: Ability to remain free from injury will improve Outcome: Progressing   Problem: Skin Integrity: Goal: Risk for impaired skin integrity will decrease Outcome: Progressing   Problem: Education: Goal: Ability to describe self-care measures that may prevent or decrease complications (Diabetes Survival Skills Education) will improve Outcome: Progressing

## 2023-05-22 NOTE — Hospital Course (Signed)
 Jacqueline Bird is a 73 y.o. 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  Recently seen in the emergency department on 12/31 for similar complaint although ultrasound was negative for DVT at that time patient feels they did not image approximately enough.  States symptoms today are similar to prior DVT symptoms.  About 3 weeks ago she sprained her left ankle and developed swelling in the ankle.  However then swelling had progressed in her leg and persisted.  She developed pain in the left groin.  Has been adherent with Eliquis  and not missed any doses.  Reports her first DVT was well hospitalized after her traumatic accident at 73 years old.  Subsequently has had 3 DVTs in the left lower extremity approximately over the past 2 years.    ED Course:  Duplex obtained of demonstrating a left femoral to popliteal DVT.  Case was discussed with hematology re: recurrent DVT on Eliquis  recommending for Lovenox  therapy.  Had plan for discharge from the ED although pain and mobility are limiting factors, currently not ambulatory on the left leg.   Assessment/Plan:   73 y.o. female with hx history of recurrent DVT on Eliquis , squamous cell carcinoma of anus stage III, HFpEF, hypertension, hyperlipidemia, diabetes, traumatic amputation of the left hand, who presents from home due to persistent left lower extremity swelling and pain in her left groin.  Found to have a left femoral DVT despite adherence with Eliquis .   Recurrent DVT left lower extremity First DVT was well hospitalized after her traumatic accident at 73 years old.  Subsequently has had 3 DVTs in the left lower extremity approximately over the past 2 years.  Had been adherent with Eliquis  confirmed with fill history  prior to this recurrent DVT.  Venous duplex today demonstrating occlusive thrombus throughout the femoral vein. While her recent injury and possibly decreased mobility associated could be considered a provoking factor-- Her recurrent DVTs are concerning for an underlying provoking factor, and in the setting of her history of malignancy this will need to be reinvestigated. - EDP has discussed with hematology recommending for Lovenox  therapy - Discontinue Eliquis  - Lovenox  1 mg/kg every 12 hours with plan for continued outpatient Lovenox  therapy, Lovenox  teaching prior to discharge.  Patient comfortable with plan for injectable anticoagulation and feels safe to manage this despite functional limitations from her traumatic amputation in the past. - Pain control: Scheduled Tylenol , as needed oxycodone  2.5/5 mg, Dilaudid  0.5 mg IV for breakthrough. - Encourage out of bed and mobility, discussed this with the patient - Physical therapy evaluation - Follow-up with oncology for surveillance of her malignancy outpatient, and anticoagulation management for her VTE   Hypokalemia Repleted   Chronic medical problems: Squamous cell carcinoma anus stage III: hx previous chemotherapy, radiation.  Currently in remission with no evidence of recurrence/metastatic disease on last imaging.  Follows with Dr. Rogers.  HFpEF: No acute exacerbation Hypertension: Not currently on antihypertensive Hyperlipidemia: Continue home atorvastatin  Diabetes: Holding home Farxiga , SSI while inpatient.

## 2023-05-22 NOTE — Progress Notes (Signed)
 Patient discharged home today, transported home by family. Discharge paperwork went over with patient, patient verbalized understanding. Belongings sent home with patient. Patient received Lovenox  starter kit and went over with patient how to inject Lovenox  injection, patient verbalized understanding. Port de-accessed by Brittany LPN.

## 2023-05-22 NOTE — Plan of Care (Signed)
  Problem: Acute Rehab PT Goals(only PT should resolve) Goal: Pt Will Go Supine/Side To Sit Flowsheets (Taken 05/22/2023 1113) Pt will go Supine/Side to Sit: Independently Goal: Patient Will Transfer Sit To/From Stand Flowsheets (Taken 05/22/2023 1113) Patient will transfer sit to/from stand: Independently Goal: Pt Will Ambulate Flowsheets (Taken 05/22/2023 1113) Pt will Ambulate:  100 feet  Independently  with least restrictive assistive device  Jacqueline Bird PT, DPT [facility] [place] [phone] office

## 2023-05-22 NOTE — Discharge Summary (Signed)
 Physician Discharge Summary   Patient: Jacqueline Bird MRN: [id number] DOB: 03-30-1951  Admit date:     05/21/2023  Discharge date: 05/22/23  Discharge Physician: Adriana DELENA Grams   PCP: Shona Norleen PEDLAR, MD   Recommendations at discharge:    Follow-up with hematologist Dr. Katragadda soon as possible Continue taking Lovenox  till further recommendation per Dr. Rogers  Discharge Diagnoses: Principal Problem:   DVT (deep venous thrombosis) (HCC)  Resolved Problems:   * No resolved hospital problems. *  Hospital Course: Jacqueline Bird is a 73 y.o. 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  Recently seen in the emergency department on 12/31 for similar complaint although ultrasound was negative for DVT at that time patient feels they did not image approximately enough.  States symptoms today are similar to prior DVT symptoms.  About 3 weeks ago she sprained her left ankle and developed swelling in the ankle.  However then swelling had progressed in her leg and persisted.  She developed pain in the left groin.  Has been adherent with Eliquis  and not missed any doses.  Reports her first DVT was well hospitalized after her traumatic accident at 73 years old.  Subsequently has had 3 DVTs in the left lower extremity approximately over the past 2 years.    ED Course:  Duplex obtained of demonstrating a left femoral to popliteal DVT.  Case was discussed with hematology re: recurrent DVT on Eliquis  recommending for Lovenox  therapy.  Had plan for discharge from the ED although pain and mobility are limiting factors, currently not ambulatory on the left leg.         Recurrent DVT left lower extremity First DVT was well hospitalized after her traumatic accident  at 73 years old.  Subsequently has had 3 DVTs in the left lower extremity approximately over the past 2 years.  Had been adherent with Eliquis  confirmed with fill history prior to this recurrent DVT.  Venous duplex today demonstrating occlusive thrombus throughout the femoral vein. While her recent injury and possibly decreased mobility associated could be considered a provoking factor-- Her recurrent DVTs are concerning for an underlying provoking factor, and in the setting of her history of malignancy this will need to be reinvestigated. - EDP has discussed with hematology recommending for Lovenox  therapy - Discontinue Eliquis  - Lovenox  1 mg/kg every 12 hours with plan for continued outpatient Lovenox  therapy, Lovenox  teaching prior to discharge.  Patient comfortable with plan for injectable anticoagulation and feels safe to manage this despite functional limitations from her traumatic amputation in the past.  - Encourage out of bed and mobility, discussed this with the patient  - Follow-up with oncology for surveillance of her malignancy outpatient, and anticoagulation management for her VTE   Hypokalemia Repleted   Chronic medical problems: Squamous cell carcinoma anus stage III: hx previous chemotherapy, radiation.  Currently in remission with no evidence of recurrence/metastatic disease on last imaging.  Follows with Dr. Rogers.  HFpEF: No acute exacerbation Hypertension: Not currently on antihypertensive Hyperlipidemia: Continue home atorvastatin  Diabetes: Holding home Farxiga , SSI while inpatient.      Procedures performed: Ultrasound lower extremities Disposition: Home Diet recommendation:  Discharge Diet Orders (From admission, onward)     Start     Ordered   05/22/23 0000  Diet -  low sodium heart healthy        05/22/23 1149           Cardiac diet DISCHARGE MEDICATION: Allergies as of 05/22/2023       Reactions   Sulfa Antibiotics Anaphylaxis, Swelling   Per pt,  can use creams with sulfa in it Only externally tolerated but not internally        Medication List     STOP taking these medications    Eliquis  5 MG Tabs tablet Generic drug: apixaban        TAKE these medications    acetaminophen  500 MG tablet Commonly known as: TYLENOL  Take 500-1,000 mg by mouth every 6 (six) hours as needed for moderate pain.   ALPRAZolam  0.5 MG tablet Commonly known as: XANAX  Take 1 tablet (0.5 mg total) by mouth at bedtime.   atorvastatin  80 MG tablet Commonly known as: LIPITOR  Take 80 mg by mouth every evening.   enoxaparin  60 MG/0.6ML injection Commonly known as: LOVENOX  Inject 0.6 mLs (60 mg total) into the skin every 12 (twelve) hours.   Farxiga  10 MG Tabs tablet Generic drug: dapagliflozin  propanediol Take 1 tablet (10 mg total) by mouth daily.   gabapentin  100 MG capsule Commonly known as: NEURONTIN  Take 1-2 capsules (100-200 mg total) by mouth See admin instructions. Take 200 mg by mouth in the morning and take 100 mg at night.   NovoLOG  FlexPen 100 UNIT/ML FlexPen Generic drug: insulin  aspart Inject into the skin 3 (three) times daily with meals.   sertraline  25 MG tablet Commonly known as: ZOLOFT  Take 25 mg by mouth daily. Taking with 100 mg, to equal total dose of 125 mg What changed: Another medication with the same name was changed. Make sure you understand how and when to take each.   sertraline  100 MG tablet Commonly known as: ZOLOFT  Take 1 tablet (100 mg total) by mouth daily. What changed: additional instructions   traZODone  50 MG tablet Commonly known as: DESYREL  Take 2 tablets (100 mg total) by mouth at bedtime.   Tresiba  FlexTouch 200 UNIT/ML FlexTouch Pen Generic drug: insulin  degludec Inject 15 Units into the skin every morning.   Trulicity  1.5 MG/0.5ML Soaj Generic drug: Dulaglutide  Inject 1.5 mg into the skin every Sunday.        Follow-up Information     Rogers Hai, MD. Schedule an  appointment as soon as possible for a visit in 1 week.   Specialty: Hematology Why: For follow-up Contact information: [address] [place] [address] [phone]         Go to  [facility] Emergency Department at [facility].   Specialty: Emergency Medicine Why: As needed, If symptoms worsen Contact information: [address] [place]  [address] [phone]               Discharge Exam: Fredricka Weights   05/21/23 1603 05/22/23 0040  Weight: 64 kg 62.4 kg        General:  AAO x 3,  cooperative, no distress;   HEENT:  Normocephalic, PERRL, otherwise with in Normal limits   Neuro:  CNII-XII intact. , normal motor and sensation, reflexes intact   Lungs:   Clear to auscultation BL, Respirations unlabored,  No wheezes / crackles  Cardio:    S1/S2, RRR, No murmure, No Rubs or Gallops   Abdomen:  Soft, non-tender, bowel sounds active all four quadrants, no guarding or peritoneal signs.  Muscular  skeletal:  Limited exam -left lower  extremity edema tenderness all the way up to the thigh area Right hand/wrist amputated -global generalized weaknesses - in bed, able to move all 4 extremities,   2+ pulses,  symmetric, No pitting edema  Skin:  Dry, warm to touch, negative for any Rashes,  Wounds: Please see nursing documentation          Condition at discharge: good  The results of significant diagnostics from this hospitalization (including imaging, microbiology, ancillary and laboratory) are listed below for reference.   Imaging Studies: US  Venous Img Lower  Left (DVT Study) Result Date: 05/21/2023 CLINICAL DATA:  Leg swelling and pain EXAM: LEFT LOWER EXTREMITY VENOUS DOPPLER ULTRASOUND TECHNIQUE: Gray-scale sonography with compression, as well as color and duplex ultrasound, were performed to evaluate the deep venous system(s) from the level of the common femoral vein through the popliteal and proximal calf veins. COMPARISON:  Left  lower extremity venous Doppler ultrasound 05/18/2023. FINDINGS: VENOUS There is a occlusive noncompressible thrombus throughout the femoral vein. Normal compressibility of the common femoral, profunda femoral, and popliteal veins, as well as the visualized calf veins. Note is made that the peroneal vein is not visualized. Visualized portions of profunda femoral vein and great saphenous vein unremarkable. Limited views of the contralateral common femoral vein are unremarkable. OTHER Diffuse lower extremity edema present. Limitations: none IMPRESSION: Occlusive deep venous thrombosis throughout the left femoral vein. Electronically Signed   By: Greig Pique M.D.   On: 05/21/2023 20:49   US  Venous Img Lower Unilateral Left (DVT) Result Date: 05/18/2023 CLINICAL DATA:  Left lower extremity pain and edema following fall 1 week ago. History of previous DVT, on anticoagulation. Evaluate for acute or chronic DVT. EXAM: LEFT LOWER EXTREMITY VENOUS DOPPLER ULTRASOUND TECHNIQUE: Gray-scale sonography with graded compression, as well as color Doppler and duplex ultrasound were performed to evaluate the lower extremity deep venous systems from the level of the common femoral vein and including the common femoral, femoral, profunda femoral, popliteal and calf veins including the posterior tibial, peroneal and gastrocnemius veins when visible. The superficial great saphenous vein was also interrogated. Spectral Doppler was utilized to evaluate flow at rest and with distal augmentation maneuvers in the common femoral, femoral and popliteal veins. COMPARISON:  Left lower extremity venous Doppler ultrasound-11/27/2020 (positive for DVT involving the left tibial veins) FINDINGS: Contralateral Common Femoral Vein: Respiratory phasicity is normal and symmetric with the symptomatic side. No evidence of thrombus. Normal compressibility. Common Femoral Vein: No evidence of thrombus. Normal compressibility, respiratory phasicity and  response to augmentation. Saphenofemoral Junction: No evidence of thrombus. Normal compressibility and flow on color Doppler imaging. Profunda Femoral Vein: No evidence of thrombus. Normal compressibility and flow on color Doppler imaging. Femoral Vein: No evidence of thrombus. Normal compressibility, respiratory phasicity and response to augmentation. Popliteal Vein: No evidence of thrombus. Normal compressibility, respiratory phasicity and response to augmentation. Calf Veins: No evidence of acute or chronic thrombus. Normal compressibility and flow on color Doppler imaging. Superficial Great Saphenous Vein: No evidence of thrombus. Normal compressibility. Other Findings:  None. IMPRESSION: No evidence of acute or chronic DVT within the left lower extremity special attention paid to the left tibial veins. Electronically Signed   By: Norleen Roulette M.D.   On: 05/18/2023 14:57    Microbiology: Results for orders placed or performed during the hospital encounter of 06/14/22  Surgical PCR screen     Status: None   Collection Time: 06/17/22  4:27 AM   Specimen: Nasal Mucosa; Nasal Swab  Result  Value Ref Range Status   MRSA, PCR NEGATIVE NEGATIVE Final   Staphylococcus aureus NEGATIVE NEGATIVE Final    Comment: (NOTE) The Xpert SA Assay (FDA approved for NASAL specimens in patients 48 years of age and older), is one component of a comprehensive surveillance program. It is not intended to diagnose infection nor to guide or monitor treatment. Performed at [facility], [address] [place], [place] [address]     Labs: CBC: Recent Labs  Lab 05/21/23 2035  WBC 8.6  NEUTROABS 6.9  HGB 12.0  HCT 39.1  MCV 87.9  PLT 369   Basic Metabolic Panel: Recent Labs  Lab 05/21/23 2035  NA 137  K 3.0*  CL 101  CO2 28  GLUCOSE 101*  BUN 12  CREATININE 0.52  CALCIUM  8.6*   Liver Function Tests: Recent Labs  Lab 05/21/23 2035  AST 27  ALT 23  ALKPHOS 112  BILITOT 0.6  PROT 6.8   ALBUMIN 2.8*   CBG: Recent Labs  Lab 05/22/23 0756 05/22/23 1100  GLUCAP 106* 131*    Discharge time spent: greater than 30 minutes.  Signed: Adriana DELENA Grams, MD [facility] 05/22/2023

## 2023-05-22 NOTE — H&P (Addendum)
 History and Physical    Jacqueline Bird FMW:[id number] DOB: [birth year] DOA: 05/21/2023  PCP: Shona Norleen PEDLAR, MD   Patient coming from: Home   Chief Complaint:  Chief Complaint  Patient presents with   Leg Swelling    HPI:  Jacqueline Bird is a 73 y.o. 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  Recently seen in the emergency department on 12/31 for similar complaint although ultrasound was negative for DVT at that time patient feels they did not image approximately enough.  States symptoms today are similar to prior DVT symptoms.  About 3 weeks ago she sprained her left ankle and developed swelling in the ankle.  However then swelling had progressed in her leg and persisted.  She developed pain in the left groin.  Has been adherent with Eliquis  and not missed any doses.  Reports her first DVT was well hospitalized after her traumatic accident at 73 years old.  Subsequently has had 3 DVTs in the left lower extremity approximately over the past 2 years.   Review of Systems:  ROS complete and negative except as marked above   Allergies  Allergen Reactions   Sulfa Antibiotics Anaphylaxis and Swelling    Per pt, can use creams with sulfa in it Only externally tolerated but not internally    Prior to Admission medications   Medication Sig Start Date End Date Taking? Authorizing Provider  acetaminophen  (TYLENOL ) 500 MG tablet Take 500-1,000 mg by mouth every 6 (six) hours as needed for moderate pain.   Yes [provider]  ALPRAZolam  (XANAX ) 0.5 MG tablet Take 1 tablet (0.5 mg total) by mouth at bedtime. 06/23/22  Yes Samtani, Jai-Gurmukh, MD  atorvastatin  (LIPITOR ) 80 MG tablet Take 80 mg by mouth every evening.  11/29/19  Yes [provider]   ELIQUIS  5 MG TABS tablet Take 1 tablet (5 mg total) by mouth 2 (two) times daily for 20 days. 06/23/22 05/21/23 Yes Samtani, Jai-Gurmukh, MD  FARXIGA  10 MG TABS tablet Take 1 tablet (10 mg total) by mouth daily. 06/23/22  Yes Samtani, Jai-Gurmukh, MD  gabapentin  (NEURONTIN ) 100 MG capsule Take 1-2 capsules (100-200 mg total) by mouth See admin instructions. Take 200 mg by mouth in the morning and take 100 mg at night. 06/23/22  Yes Samtani, Jai-Gurmukh, MD  NOVOLOG  FLEXPEN 100 UNIT/ML FlexPen Inject into the skin 3 (three) times daily with meals. 04/06/23  Yes [provider]  sertraline  (ZOLOFT ) 100 MG tablet Take 1 tablet (100 mg total) by mouth daily. Patient taking differently: Take 100 mg by mouth daily. Taking with 25 mg to equal total of 125 mg 06/23/22  Yes Samtani, Jai-Gurmukh, MD  sertraline  (ZOLOFT ) 25 MG tablet Take 25 mg by mouth daily. Taking with 100 mg, to equal total dose of 125 mg   Yes [provider]  traZODone  (DESYREL ) 50 MG tablet Take 2 tablets (100 mg total) by mouth at bedtime. 06/23/22  Yes Samtani, Jai-Gurmukh, MD  TRESIBA  FLEXTOUCH 200 UNIT/ML FlexTouch Pen Inject 15 Units into the skin every morning. 04/06/23  Yes [provider]  TRULICITY  1.5 MG/0.5ML SOAJ Inject 1.5 mg into the skin every Sunday. 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, left (  HCC)    Anal cancer (HCC)    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   Cervical cancer (HCC)    Depression    DVT (deep venous thrombosis) (HCC)    Essential hypertension    History of cervical cancer    Mixed hyperlipidemia    Type 2 diabetes mellitus [facility])     Past Surgical History:  Procedure Laterality Date   AIR/FLUID EXCHANGE Left 03/01/2020   Procedure: AIR/FLUID EXCHANGE;  Surgeon: Tobie Baptist, MD;  Location: [facility] OR;  Service: Ophthalmology;  Laterality: Left;   CATARACT EXTRACTION, BILATERAL Right 05/2017    CERVICAL CONE BIOPSY     HAND AMPUTATION Left    INJECTION OF SILICONE OIL Left 03/01/2020   Procedure: INJECTION OF SILICONE OIL;  Surgeon: Tobie Baptist, MD;  Location: [facility] OR;  Service: Ophthalmology;  Laterality: Left;   INTRAMEDULLARY (IM) NAIL INTERTROCHANTERIC Left 06/17/2022   Procedure: ORIF LEFT HIP INTERTROCHANTERIC FRACTURE;  Surgeon: Jerri Kay HERO, MD;  Location: [facility] OR;  Service: Orthopedics;  Laterality: Left;   PARS PLANA VITRECTOMY Left 03/01/2020   Procedure: PARS PLANA VITRECTOMY WITH 25 GAUGE;  Surgeon: Tobie Baptist, MD;  Location: [facility] OR;  Service: Ophthalmology;  Laterality: Left;   PHOTOCOAGULATION WITH LASER Left 03/01/2020   Procedure: PHOTOCOAGULATION WITH LASER;  Surgeon: Tobie Baptist, MD;  Location: [facility] OR;  Service: Ophthalmology;  Laterality: Left;   PORTACATH PLACEMENT Left 02/08/2019   Procedure: INSERTION PORT-A-CATH;  Surgeon: Kallie Manuelita BROCKS, MD;  Location: [facility] ORS;  Service: General;  Laterality: Left;   RECTAL BIOPSY  01/26/2019   Procedure: BIOPSY OF ANAL MASS;  Surgeon: Kallie Manuelita BROCKS, MD;  Location: [facility] ORS;  Service: General;;   RECTAL BIOPSY N/A 11/13/2019   Procedure: BIOPSY ANAL AREA;  Surgeon: Kallie Manuelita BROCKS, MD;  Location: [facility] ORS;  Service: General;  Laterality: N/A;   RECTAL EXAM UNDER ANESTHESIA N/A 01/26/2019   Procedure: RECTAL EXAM UNDER ANESTHESIA;  Surgeon: Kallie Manuelita BROCKS, MD;  Location: [facility] ORS;  Service: General;  Laterality: N/A;   RECTAL EXAM UNDER ANESTHESIA N/A 11/13/2019   Procedure: RECTAL EXAM UNDER ANESTHESIA  WITH ANAL BIOPSY;  Surgeon: Kallie Manuelita BROCKS, MD;  Location: [facility] ORS;  Service: General;  Laterality: N/A;   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 She  has been exposed to tobacco smoke. She has never used smokeless tobacco. She reports that she does not currently use alcohol. She reports that she does not use drugs.  Family History  Problem Relation Age of Onset   Cataracts Mother    Glaucoma Mother    Heart disease Mother    Diabetes Father    Heart attack Father    Cataracts Sister    Lung disease Sister    Cataracts Brother    Diabetes Brother    Heart disease Brother    Macular degeneration Maternal Aunt    Diabetes Maternal Grandfather    Heart disease Sister    Diabetes Sister    Throat cancer Son    Breast cancer Niece    Breast cancer Niece    Amblyopia Neg Hx    Blindness Neg Hx    Retinal detachment Neg Hx    Strabismus Neg Hx  Retinitis pigmentosa Neg Hx      Physical Exam: Vitals:   05/21/23 1603 05/21/23 1900 05/21/23 2320 05/22/23 0040  BP:  (!) 150/58  (!) 134/55  Pulse:  92 91 90  Resp:   17 18  Temp:   98.2 F (36.8 C) 98.5 F (36.9 C)  TempSrc:   Oral Oral  SpO2:  95% 98% 99%  Weight: 64 kg   62.4 kg  Height: 5' 4.5 (1.638 m)       Gen: Awake, alert, NAD   CV: Regular, normal S1, S2, no murmurs  Resp: Normal WOB, CTAB  Abd: Flat, normoactive, nontender MSK: Asymmetric left lower extremity edema 2+ up to the thigh, slightly indurated in the groin.  Distally neurovascularly intact Skin: No rashes or lesions to exposed skin  Neuro: Alert and interactive  Psych: euthymic, appropriate    Data review:   Labs reviewed, notable for:   K3 Otherwise blood counts and chemistries unremarkable  Micro:  Results for orders placed or performed during the hospital encounter of 06/14/22  Surgical PCR screen     Status: None   Collection Time: 06/17/22  4:27 AM   Specimen: Nasal Mucosa; Nasal Swab  Result Value Ref Range Status   MRSA, PCR NEGATIVE NEGATIVE Final   Staphylococcus aureus NEGATIVE NEGATIVE Final    Comment: (NOTE) The Xpert SA Assay (FDA approved for NASAL specimens in patients  69 years of age and older), is one component of a comprehensive surveillance program. It is not intended to diagnose infection nor to guide or monitor treatment. Performed at [facility], [address], [place], [place] [address]     Imaging reviewed:  US  Venous Img Lower  Left (DVT Study) Result Date: 05/21/2023 CLINICAL DATA:  Leg swelling and pain EXAM: LEFT LOWER EXTREMITY VENOUS DOPPLER ULTRASOUND TECHNIQUE: Gray-scale sonography with compression, as well as color and duplex ultrasound, were performed to evaluate the deep venous system(s) from the level of the common femoral vein through the popliteal and proximal calf veins. COMPARISON:  Left lower extremity venous Doppler ultrasound 05/18/2023. FINDINGS: VENOUS There is a occlusive noncompressible thrombus throughout the femoral vein. Normal compressibility of the common femoral, profunda femoral, and popliteal veins, as well as the visualized calf veins. Note is made that the peroneal vein is not visualized. Visualized portions of profunda femoral vein and great saphenous vein unremarkable. Limited views of the contralateral common femoral vein are unremarkable. OTHER Diffuse lower extremity edema present. Limitations: none IMPRESSION: Occlusive deep venous thrombosis throughout the left femoral vein. Electronically Signed   By: Greig Pique M.D.   On: 05/21/2023 20:49     ED Course:  Duplex obtained of demonstrating a left femoral to popliteal DVT.  Case was discussed with hematology re: recurrent DVT on Eliquis  recommending for Lovenox  therapy.  Had plan for discharge from the ED although pain and mobility are limiting factors, currently not ambulatory on the left leg.  Assessment/Plan:  73 y.o. 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  Found to have a left femoral DVT despite adherence with Eliquis .  Recurrent DVT left lower extremity First DVT was well hospitalized after her traumatic accident at 73 years old.  Subsequently has had 3 DVTs in the left lower extremity approximately over the  past 2 years.  Had been adherent with Eliquis  confirmed with fill history prior to this recurrent DVT.  Venous duplex today demonstrating occlusive thrombus throughout the femoral vein. While her recent injury and possibly decreased mobility associated could be considered a provoking factor-- Her recurrent DVTs are concerning for an underlying provoking factor, and in the setting of her history of malignancy this will need to be reinvestigated. - EDP has discussed with hematology recommending for Lovenox  therapy - Discontinue Eliquis  - Lovenox  1 mg/kg every 12 hours with plan for continued outpatient Lovenox  therapy, Lovenox  teaching prior to discharge.  Patient comfortable with plan for injectable anticoagulation and feels safe to manage this despite functional limitations from her traumatic amputation in the past. - Pain control: Scheduled Tylenol , as needed oxycodone  2.5/5 mg, Dilaudid  0.5 mg IV for breakthrough. - Encourage out of bed and mobility, discussed this with the patient - Physical therapy evaluation - Follow-up with oncology for surveillance of her malignancy outpatient, and anticoagulation management for her VTE  Hypokalemia Repleted  Chronic medical problems: Squamous cell carcinoma anus stage III: hx previous chemotherapy, radiation.  Currently in remission with no evidence of recurrence/metastatic disease on last imaging.  Follows with Dr. Rogers.  HFpEF: No acute exacerbation Hypertension: Not currently on antihypertensive Hyperlipidemia: Continue home atorvastatin  Diabetes: Holding home Farxiga , SSI while inpatient.    Body mass index is 23.25 kg/m.    DVT  prophylaxis:  Lovenox  Code Status:  DNR/DNI(Do NOT Intubate); Confirmed with the patient  Diet:  Diet Orders (From admission, onward)     Start     Ordered   05/21/23 2316  Diet regular Room service appropriate? Yes; Fluid consistency: Thin  Diet effective now       Question Answer Comment  Room service appropriate? Yes   Fluid consistency: Thin      05/21/23 2316           Family Communication:  No   Consults:  Hematology   Admission status:   Observation, Med-Surg  Severity of Illness: The appropriate patient status for this patient is OBSERVATION. Observation status is judged to be reasonable and necessary in order to provide the required intensity of service to ensure the patient's safety. The patient's presenting symptoms, physical exam findings, and initial radiographic and laboratory data in the context of their medical condition is felt to place them at decreased risk for further clinical deterioration. Furthermore, it is anticipated that the patient will be medically stable for discharge from the hospital within 2 midnights of admission.    Dorn Dawson, MD [facility]  How to contact the [facility] Attending or Consulting provider 7A - 7P or covering provider during after hours 7P -7A, for this patient.  Check the care team in [facility] and look for a) attending/consulting [facility] provider listed and b) the [facility] team listed Log into [email or web address] and use [place]'s universal password to access. If you do not have the password, please contact the hospital operator. Locate the [facility] provider you are looking for under [facility] and page to a number that you can be directly reached. If you still have difficulty reaching the provider, please page the [facility] (Director on Call) for the Hospitalists listed on amion for assistance.  05/22/2023, 1:56 AM

## 2023-05-22 NOTE — Progress Notes (Signed)
 Patients blood pressure 111/37 map 59 in right arm, blood pressure 114/32 map 52. MD Shahmehdi made aware. New order monitor patients blood pressure.

## 2023-05-22 NOTE — Evaluation (Signed)
 Physical Therapy Evaluation Patient Details Name: Jacqueline Bird MRN: [id number] DOB: 1950-09-27 Today's Date: 05/22/2023  History of Present Illness  a 73 y.o. 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  Recently seen in the emergency department on 12/31 for similar complaint although ultrasound was negative for DVT at that time patient feels they did not image approximately enough.  States symptoms today are similar to prior DVT symptoms.  About 3 weeks ago she sprained her left ankle and developed swelling in the ankle.  However then swelling had progressed in her leg and persisted.  She developed pain in the left groin.  Has been adherent with Eliquis  and not missed any doses.  Reports her first DVT was well hospitalized after her traumatic accident at 73 years old.  Subsequently has had 3 DVTs in the left lower extremity approximately over the past 2 years  Clinical Impression   Pt tolerated today's Physical Therapy Evaluation, will, with decent carryover for mobility with assistive device from edge of bed.  Per medication history, Lovenox  injection given at 0 100 a.m. this morning, allowing physical therapy mobility 3 hours after initial injection.  Patient demonstrating slow labored movement due to left lower extremity pain which is extremity with DVT.  Prior level of function is independent at home and community level.. Pt demonstrating mild functional deficits relating to transfers, and ambulation due to muscle weakness, limited range of motion mobility, and mild balance deficits.  Patient wishing to return home.  Patient is at functional enough to return home with family to assist, is agreeable to home health physical therapy services.. Based upon these  deficits/impairments, patient will benefit from continued skilled physical therapy services during remainder of hospital stay and at the next recommended venue of care to address deficits and promote return to optimal function.                If plan is discharge home, recommend the following: A little help with walking and/or transfers;A little help with bathing/dressing/bathroom   Can travel by private vehicle        Equipment Recommendations None recommended by PT  Recommendations for Other Services       Functional Status Assessment Patient has had a recent decline in their functional status and demonstrates the ability to make significant improvements in function in a reasonable and predictable amount of time.     Precautions / Restrictions Precautions Precautions: None Restrictions Weight Bearing Restrictions Per Provider Order: No      Mobility  Bed Mobility Overal bed mobility: Needs Assistance Bed Mobility: Supine to Sit, Sit to Supine     Supine to sit: Modified independent (Device/Increase time) Sit to supine: Modified independent (Device/Increase time)   General bed mobility comments: slow, labored movement to EOB Patient Response: Cooperative  Transfers Overall transfer level: Needs assistance Equipment used: Rolling walker (2 wheels) Transfers: Sit to/from Stand Sit to Stand: Supervision           General transfer comment: supervision for sit/stand from EOB to RW, slow labored    Ambulation/Gait Ambulation/Gait assistance: Modified independent (Device/Increase time) Gait Distance (Feet): 20 Feet Assistive device: Rolling walker (2 wheels) Gait Pattern/deviations: WFL(Within Functional Limits), Step-through pattern, Decreased step length - right, Decreased step length - left, Decreased stance time -  right, Decreased stride length, Decreased dorsiflexion - right, Decreased weight shift to right       General Gait Details: slow, labored gait  at supervision level with RW.  Stairs            Wheelchair Mobility     Tilt Bed Tilt Bed Patient Response: Cooperative  Modified Rankin (Stroke Patients Only)       Balance Overall balance assessment: Needs assistance Sitting-balance support: No upper extremity supported Sitting balance-Leahy Scale: Good Sitting balance - Comments: sitting EOB   Standing balance support: During functional activity, Bilateral upper extremity supported Standing balance-Leahy Scale: Fair Standing balance comment: standing with RW                             Pertinent Vitals/Pain Pain Assessment Pain Assessment: 0-10 Pain Score: 6  Pain Location: LLE Pain Descriptors / Indicators: Aching    Home Living Family/patient expects to be discharged to:: Private residence Living Arrangements: Non-relatives/Friends Available Help at Discharge: Friend(s);Available PRN/intermittently Type of Home: House Home Access: Level entry       Home Layout: One level Home Equipment: Agricultural Consultant (2 wheels);Shower seat;Cane - quad      Prior Function Prior Level of Function : Independent/Modified Independent             Mobility Comments: Independent with household ambulation without AD. Hurry-cane for community ADLs Comments: Indep with ADLs, has occasional assist with household IADLs from hired personnel. Son will assist with heavier yard work tasks, etc as needed     Extremity/Trunk Assessment   Upper Extremity Assessment Upper Extremity Assessment: Generalized weakness;Overall [facility] for tasks assessed    Lower Extremity Assessment Lower Extremity Assessment: Generalized weakness;Overall WFL for tasks assessed    Cervical / Trunk Assessment Cervical / Trunk Assessment: Normal  Communication      Cognition Arousal: Alert Behavior During Therapy: WFL for tasks assessed/performed Overall Cognitive Status: Within Functional Limits for tasks assessed                                           General Comments      Exercises     Assessment/Plan    PT Assessment Patient needs continued PT services  PT Problem List Decreased strength;Decreased range of motion;Decreased activity tolerance;Decreased balance;Decreased mobility       PT Treatment Interventions DME instruction;Gait training;Functional mobility training;Therapeutic activities;Therapeutic exercise;Balance training;Neuromuscular re-education    PT Goals (Current goals can be found in the Care Plan section)  Acute Rehab PT Goals Patient Stated Goal: return home PT Goal Formulation: With patient Time For Goal Achievement: 06/05/23 Potential to Achieve Goals: Good    Frequency Min 2X/week     Co-evaluation               AM-PAC PT 6 Clicks Mobility  Outcome Measure Help needed turning from your back to your side while in a flat bed without using bedrails?: None Help needed moving from lying on your back to sitting on the side of a flat bed without using bedrails?: None Help needed moving to and from a bed to a chair (including a wheelchair)?: None Help needed standing up from a chair using your arms (e.g., wheelchair or bedside chair)?: A Little Help needed to walk in hospital room?: A Little Help needed climbing 3-5 steps with a  railing? : A Lot 6 Click Score: 20    End of Session Equipment Utilized During Treatment: Gait belt Activity Tolerance: Patient tolerated treatment well;No increased pain Patient left: in bed;with call bell/phone within reach;with bed alarm set Nurse Communication: Mobility status PT Visit Diagnosis: Unsteadiness on feet (R26.81);Other abnormalities of gait and mobility (R26.89);Muscle weakness (generalized) (M62.81)    Time: 9060-9048 PT Time Calculation (min) (ACUTE ONLY): 12 min   Charges:   PT Evaluation $PT Eval Low Complexity: 1 Low   PT General Charges $$ ACUTE PT VISIT: 1 Visit         Omega JONETTA Donna ALMETA,  DPT [facility] [place] [phone] office  Omega JONETTA Donna 05/22/2023, 11:11 AM

## 2023-06-03 ENCOUNTER — Inpatient Hospital Stay: Payer: Medicare PPO | Attending: Hematology

## 2023-06-03 ENCOUNTER — Ambulatory Visit (HOSPITAL_COMMUNITY): Admission: RE | Admit: 2023-06-03 | Payer: HMO | Source: Ambulatory Visit

## 2023-06-03 ENCOUNTER — Encounter (HOSPITAL_COMMUNITY): Payer: Self-pay

## 2023-06-10 ENCOUNTER — Inpatient Hospital Stay: Payer: Medicare PPO | Admitting: Hematology

## 2023-06-28 ENCOUNTER — Inpatient Hospital Stay: Payer: PPO | Attending: Hematology

## 2023-06-28 ENCOUNTER — Ambulatory Visit (HOSPITAL_COMMUNITY): Payer: PPO

## 2023-06-28 ENCOUNTER — Encounter (HOSPITAL_COMMUNITY): Payer: Self-pay

## 2023-06-28 DIAGNOSIS — E876 Hypokalemia: Secondary | ICD-10-CM | POA: Insufficient documentation

## 2023-06-28 DIAGNOSIS — Z7901 Long term (current) use of anticoagulants: Secondary | ICD-10-CM | POA: Insufficient documentation

## 2023-06-28 DIAGNOSIS — Z85048 Personal history of other malignant neoplasm of rectum, rectosigmoid junction, and anus: Secondary | ICD-10-CM | POA: Insufficient documentation

## 2023-06-28 DIAGNOSIS — Z86718 Personal history of other venous thrombosis and embolism: Secondary | ICD-10-CM | POA: Insufficient documentation

## 2023-07-03 IMAGING — US US EXTREM LOW VENOUS*L*
1 series · 13 of 24 positions shown · non-contrast
Comparison: None.

CLINICAL DATA: 69-year-old female with a history of leg pain and
swelling



[Series 1: us venous img lower uni left (dvt) · portal-venous · 13 of 55 slices shown]
[im 1/55]
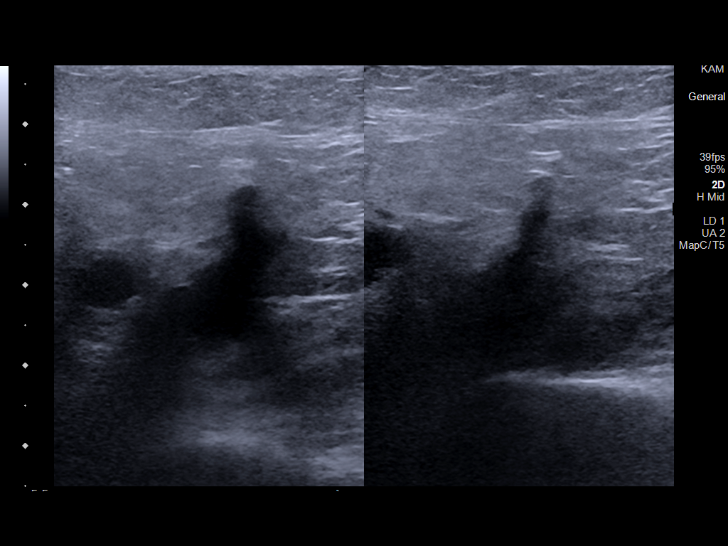
[im 5/55]
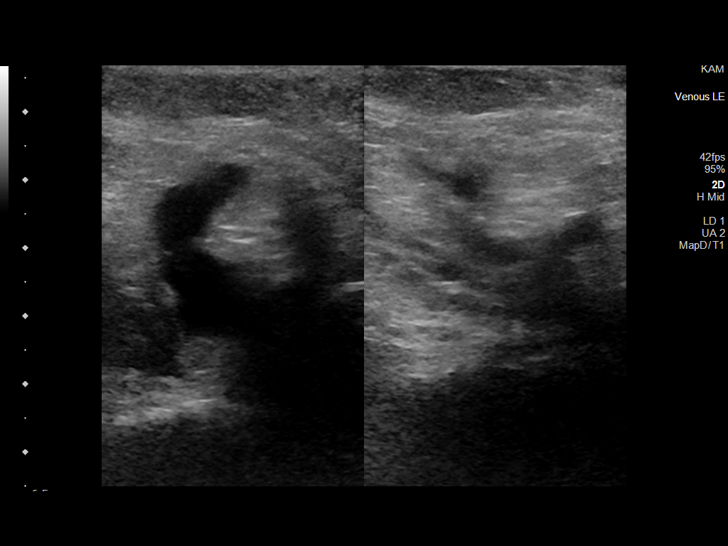
[im 10/55]
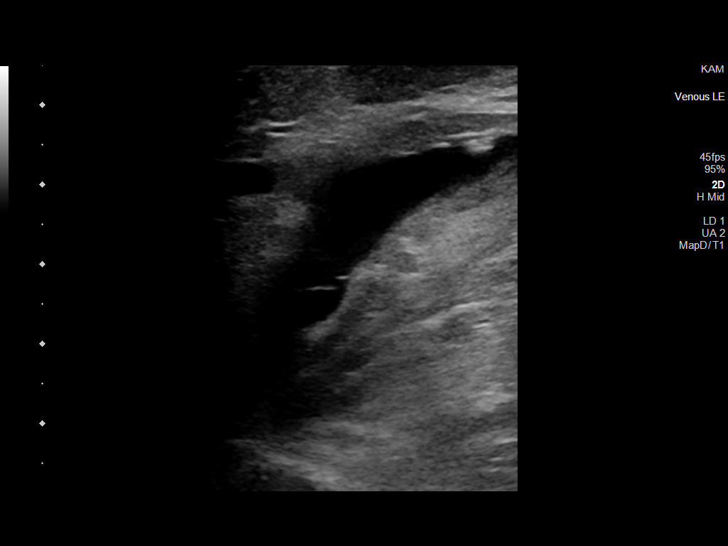
[im 15/55]
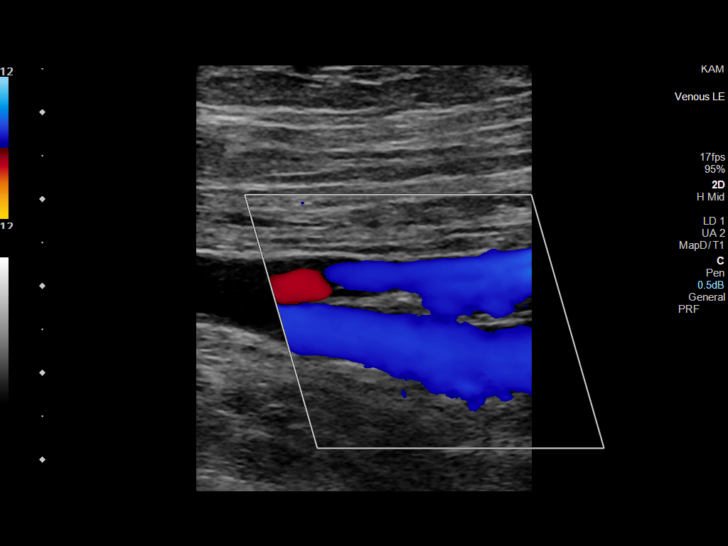
[im 19/55]
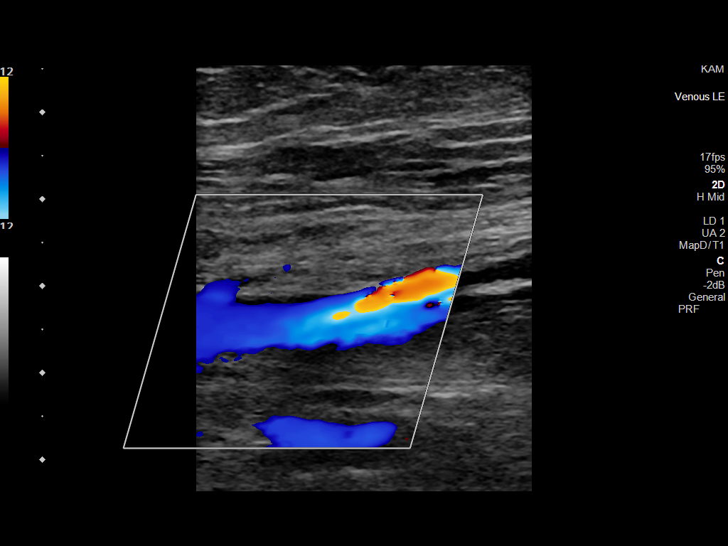
[im 24/55]
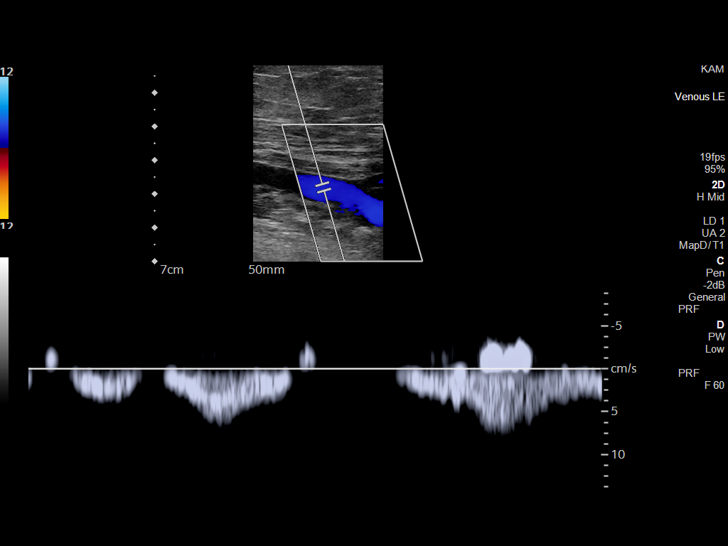
[im 29/55]
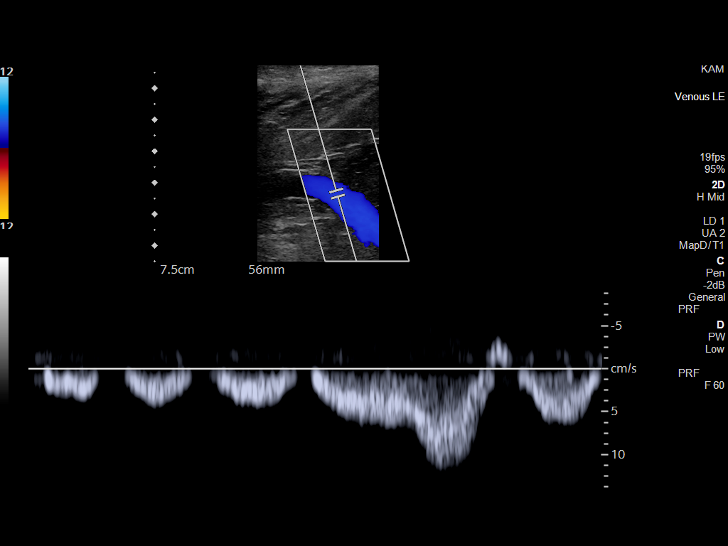
[im 31/55]
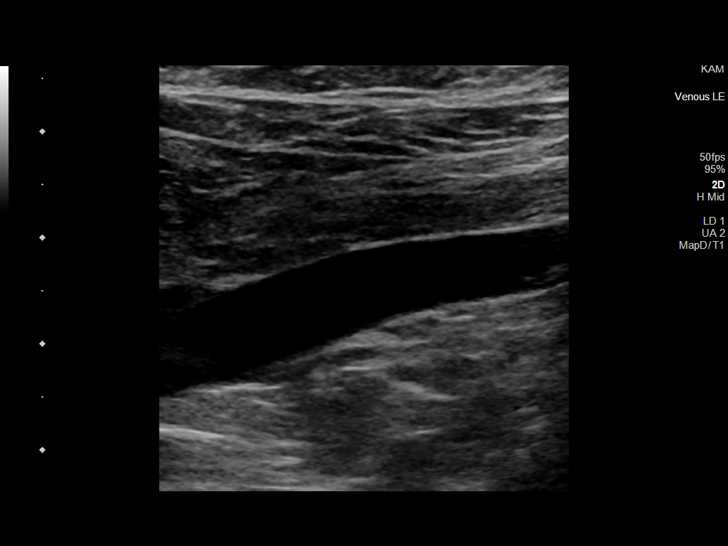
[im 36/55]
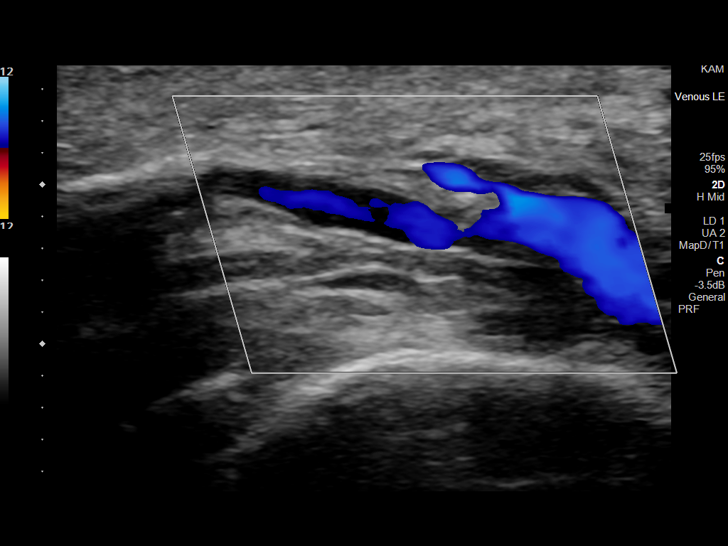
[im 40/55]
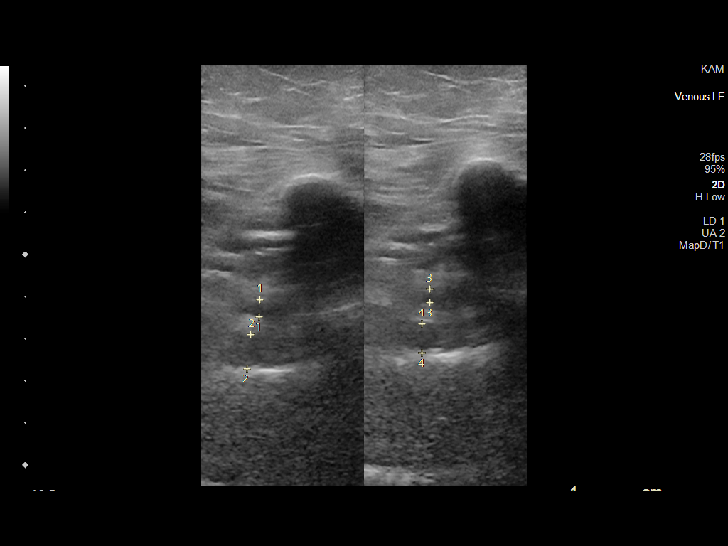
[im 45/55]
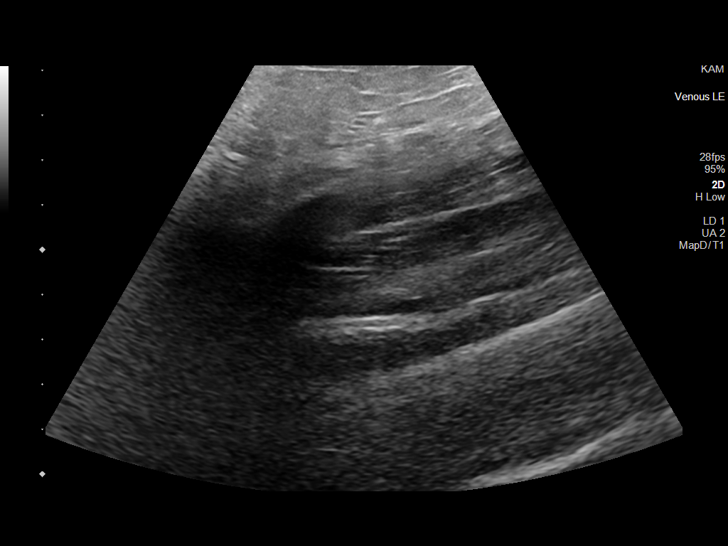
[im 50/55]
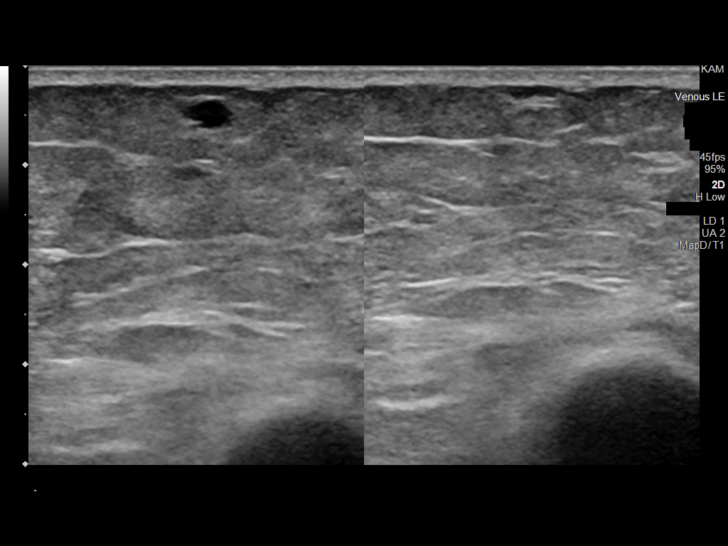
[im 55/55]
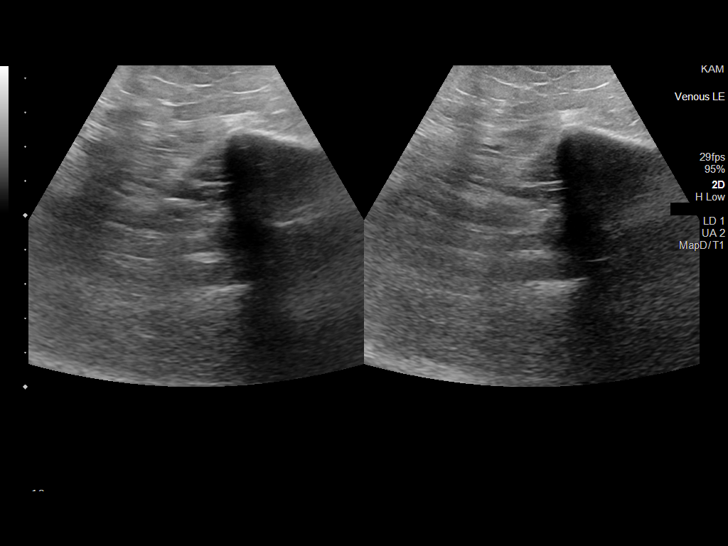

[13 of 24 positions shown; findings below may reference images not displayed]

FINDINGS: Contralateral Common Femoral Vein: Respiratory phasicity is normal
and symmetric with the symptomatic side. No evidence of thrombus.
Normal compressibility.

Common Femoral Vein: No evidence of thrombus. Normal
compressibility, respiratory phasicity and response to augmentation.

Saphenofemoral Junction: No evidence of thrombus. Normal
compressibility and flow on color Doppler imaging.

Profunda Femoral Vein: No evidence of thrombus. Normal
compressibility and flow on color Doppler imaging.

Femoral Vein: No evidence of thrombus. Normal compressibility,
respiratory phasicity and response to augmentation.

Popliteal Vein: No evidence of thrombus. Normal compressibility,
respiratory phasicity and response to augmentation.

Calf Veins: Posterior tibial vein patent and compressible with flow
maintained. Peroneal vein with occlusive thrombus with loss of
compressibility and flow. No extension into the popliteal vein.

Superficial Great Saphenous Vein: No evidence of thrombus. Normal
compressibility and flow on color Doppler imaging.

Other Findings:  None.
IMPRESSION: Sonographic survey is positive for distal DVT involving the left
calf, peroneal vein, with occlusive DVT.

Sonographic survey negative for proximal DVT of the left common
femoral vein, femoral vein, popliteal vein.

These results were called by telephone at the time of interpretation
on 11/27/2020 at [DATE] Dr. KALIN CHARLIE ,

## 2023-07-06 ENCOUNTER — Inpatient Hospital Stay: Payer: PPO | Admitting: Hematology

## 2023-07-12 ENCOUNTER — Ambulatory Visit (HOSPITAL_COMMUNITY)
Admission: RE | Admit: 2023-07-12 | Discharge: 2023-07-12 | Disposition: A | Discharge status: Home / Self Care | Payer: PPO | Source: Ambulatory Visit | Attending: Hematology | Admitting: Hematology

## 2023-07-12 ENCOUNTER — Inpatient Hospital Stay: Payer: PPO

## 2023-07-12 DIAGNOSIS — C210 Malignant neoplasm of anus, unspecified: Secondary | ICD-10-CM | POA: Diagnosis not present

## 2023-07-12 DIAGNOSIS — Z86718 Personal history of other venous thrombosis and embolism: Secondary | ICD-10-CM | POA: Diagnosis not present

## 2023-07-12 DIAGNOSIS — Z85048 Personal history of other malignant neoplasm of rectum, rectosigmoid junction, and anus: Secondary | ICD-10-CM | POA: Diagnosis not present

## 2023-07-12 DIAGNOSIS — Z7901 Long term (current) use of anticoagulants: Secondary | ICD-10-CM | POA: Diagnosis not present

## 2023-07-12 DIAGNOSIS — E876 Hypokalemia: Secondary | ICD-10-CM | POA: Diagnosis not present

## 2023-07-12 DIAGNOSIS — R590 Localized enlarged lymph nodes: Secondary | ICD-10-CM | POA: Diagnosis not present

## 2023-07-12 LAB — COMPREHENSIVE METABOLIC PANEL
ALT: 23 U/L (ref 0–44)
AST: 30 U/L (ref 15–41)
Albumin: 2.7 g/dL — ABNORMAL LOW (ref 3.5–5.0)
Alkaline Phosphatase: 85 U/L (ref 38–126)
Anion gap: 11 (ref 5–15)
BUN: 10 mg/dL (ref 8–23)
CO2: 31 mmol/L (ref 22–32)
Calcium: 9.9 mg/dL (ref 8.9–10.3)
Chloride: 89 mmol/L — ABNORMAL LOW (ref 98–111)
Creatinine, Ser: 0.51 mg/dL (ref 0.44–1.00)
GFR, Estimated: >60 mL/min (ref >60)
Glucose, Bld: 167 mg/dL — ABNORMAL HIGH (ref 70–99)
Potassium: 2.6 mmol/L — CL (ref 3.5–5.1)
Sodium: 131 mmol/L — ABNORMAL LOW (ref 135–145)
Total Bilirubin: 0.7 mg/dL (ref 0.0–1.2)
Total Protein: 6.7 g/dL (ref 6.5–8.1)

## 2023-07-12 LAB — CBC WITH DIFFERENTIAL/PLATELET
Abs Immature Granulocytes: 0.08 10*3/uL — ABNORMAL HIGH (ref 0.00–0.07)
Basophils Absolute: 0 10*3/uL (ref 0.0–0.1)
Basophils Relative: 0 %
Eosinophils Absolute: 0 10*3/uL (ref 0.0–0.5)
Eosinophils Relative: 0 %
HCT: 39.2 % (ref 36.0–46.0)
Hemoglobin: 12.2 g/dL (ref 12.0–15.0)
Immature Granulocytes: 1 %
Lymphocytes Relative: 5 %
Lymphs Abs: 0.6 10*3/uL — ABNORMAL LOW (ref 0.7–4.0)
MCH: 26.5 pg (ref 26.0–34.0)
MCHC: 31.1 g/dL (ref 30.0–36.0)
MCV: 85 fL (ref 80.0–100.0)
Monocytes Absolute: 0.6 10*3/uL (ref 0.1–1.0)
Monocytes Relative: 5 %
Neutro Abs: 10.8 10*3/uL — ABNORMAL HIGH (ref 1.7–7.7)
Neutrophils Relative %: 89 %
Platelets: 389 10*3/uL (ref 150–400)
RBC: 4.61 MIL/uL (ref 3.87–5.11)
RDW: 14.3 % (ref 11.5–15.5)
WBC: 12.2 10*3/uL — ABNORMAL HIGH (ref 4.0–10.5)
nRBC: 0 % (ref 0.0–0.2)

## 2023-07-12 MED ORDER — POTASSIUM CHLORIDE CRYS ER 20 MEQ PO TBCR
40.0000 meq | EXTENDED_RELEASE_TABLET | Freq: Once | ORAL | Status: AC
  Administered 2023-07-12: 40 meq via ORAL
  Filled 2023-07-12: qty 2

## 2023-07-12 MED ORDER — POTASSIUM CHLORIDE IN NACL 20-0.9 MEQ/L-% IV SOLN: INTRAVENOUS | Status: DC

## 2023-07-12 MED ORDER — HEPARIN SOD (PORK) LOCK FLUSH 100 UNIT/ML IV SOLN
500.0000 [IU] | Freq: Once | INTRAVENOUS | Status: AC
  Administered 2023-07-12: 500 [IU] via INTRAVENOUS

## 2023-07-12 MED ORDER — SODIUM CHLORIDE 0.9% FLUSH
10.0000 mL | Freq: Once | INTRAVENOUS | Status: AC
  Administered 2023-07-12: 10 mL via INTRAVENOUS

## 2023-07-12 MED ORDER — POTASSIUM CHLORIDE IN NACL 20-0.9 MEQ/L-% IV SOLN
Freq: Once | INTRAVENOUS | Status: AC
  Filled 2023-07-12: qty 1000

## 2023-07-12 MED ORDER — IOHEXOL 300 MG/ML  SOLN
100.0000 mL | Freq: Once | INTRAMUSCULAR | Status: AC | PRN
  Administered 2023-07-12: 100 mL via INTRAVENOUS

## 2023-07-12 NOTE — Progress Notes (Signed)
 CRITICAL VALUE ALERT Critical value received:  potassium 2.6 Date of notification:  07-12-2023 Time of notification: 12:31 pm. Critical value read back:  Yes.   Nurse who received alert:  B.Alleta Avery Rn.  MD notified time and response:  Dr. Ellin Saba @ 12:36pm.

## 2023-07-12 NOTE — Patient Instructions (Signed)
 [facility] [place] - A DEPT OF [facility]  Discharge Instructions: Thank you for choosing [facility] to provide your oncology and hematology care.  If you have a lab appointment with the Cancer Center - please note that after April 8th, 2024, all labs will be drawn in the cancer center.  You do not have to check in or register with the main entrance as you have in the past but will complete your check-in in the cancer center.  Wear comfortable clothing and clothing appropriate for easy access to any Portacath or PICC line.   We strive to give you quality time with your provider. You may need to reschedule your appointment if you arrive late (15 or more minutes).  Arriving late affects you and other patients whose appointments are after yours.  Also, if you miss three or more appointments without notifying the office, you may be dismissed from the clinic at the provider's discretion.      For prescription refill requests, have your pharmacy contact our office and allow 72 hours for refills to be completed.    Today you received the following chemotherapy and/or immunotherapy agents Potassium fluids/PO potassium      To help prevent nausea and vomiting after your treatment, we encourage you to take your nausea medication as directed.  BELOW ARE SYMPTOMS THAT SHOULD BE REPORTED IMMEDIATELY: *FEVER GREATER THAN 100.4 F (38 C) OR HIGHER *CHILLS OR SWEATING *NAUSEA AND VOMITING THAT IS NOT CONTROLLED WITH YOUR NAUSEA MEDICATION *UNUSUAL SHORTNESS OF BREATH *UNUSUAL BRUISING OR BLEEDING *URINARY PROBLEMS (pain or burning when urinating, or frequent urination) *BOWEL PROBLEMS (unusual diarrhea, constipation, pain near the anus) TENDERNESS IN MOUTH AND THROAT WITH OR WITHOUT PRESENCE OF ULCERS (sore throat, sores in mouth, or a toothache) UNUSUAL RASH, SWELLING OR PAIN  UNUSUAL VAGINAL DISCHARGE OR ITCHING   Items with * indicate a potential emergency and  should be followed up as soon as possible or go to the Emergency Department if any problems should occur.  Please show the CHEMOTHERAPY ALERT CARD or IMMUNOTHERAPY ALERT CARD at check-in to the Emergency Department and triage nurse.  Should you have questions after your visit or need to cancel or reschedule your appointment, please contact [facility] [place] - A DEPT OF [facility] [phone]  and follow the prompts.  Office hours are 8:00 a.m. to 4:30 p.m. Monday - Friday. Please note that voicemails left after 4:00 p.m. may not be returned until the following business day.  We are closed weekends and major holidays. You have access to a nurse at all times for urgent questions. Please call the main number to the clinic [phone] and follow the prompts.  For any non-urgent questions, you may also contact your provider using MyChart. We now offer e-Visits for anyone 17 and older to request care online for non-urgent symptoms. For details visit [email or web address].   Also download the MyChart app! Go to the app store, search "MyChart", open the app, select [place], and log in with your MyChart username and password.

## 2023-07-12 NOTE — Progress Notes (Signed)
 Patient presents today for 1 liter NS with potassium  and PO potassium before and after for a potassium level of 2.6, per standing orders.  Treatment given today per MD orders.  Tolerated infusion without adverse affects.  Vital signs stable.  No complaints at this time.  Discharge from clinic via wheelchair in stable condition.  Alert and oriented X 3.  Follow up with [facility] as scheduled.

## 2023-07-19 ENCOUNTER — Inpatient Hospital Stay: Payer: PPO | Admitting: Hematology

## 2023-07-19 ENCOUNTER — Encounter (HOSPITAL_COMMUNITY): Payer: Self-pay | Admitting: *Deleted

## 2023-07-19 ENCOUNTER — Inpatient Hospital Stay (HOSPITAL_COMMUNITY)
Admission: EM | Admit: 2023-07-19 | Discharge: 2023-07-26 | DRG: 565 | Disposition: A | Discharge status: Skilled Nursing Facility | Attending: Internal Medicine | Admitting: Internal Medicine

## 2023-07-19 ENCOUNTER — Other Ambulatory Visit: Payer: Self-pay

## 2023-07-19 ENCOUNTER — Telehealth: Payer: Self-pay | Admitting: *Deleted

## 2023-07-19 ENCOUNTER — Encounter: Payer: Self-pay | Admitting: *Deleted

## 2023-07-19 DIAGNOSIS — Z8249 Family history of ischemic heart disease and other diseases of the circulatory system: Secondary | ICD-10-CM

## 2023-07-19 DIAGNOSIS — Z66 Do not resuscitate: Secondary | ICD-10-CM | POA: Diagnosis present

## 2023-07-19 DIAGNOSIS — M898X8 Other specified disorders of bone, other site: Secondary | ICD-10-CM | POA: Diagnosis present

## 2023-07-19 DIAGNOSIS — E782 Mixed hyperlipidemia: Secondary | ICD-10-CM | POA: Diagnosis present

## 2023-07-19 DIAGNOSIS — E876 Hypokalemia: Secondary | ICD-10-CM | POA: Diagnosis not present

## 2023-07-19 DIAGNOSIS — I5032 Chronic diastolic (congestive) heart failure: Secondary | ICD-10-CM | POA: Diagnosis present

## 2023-07-19 DIAGNOSIS — S99919A Unspecified injury of unspecified ankle, initial encounter: Secondary | ICD-10-CM | POA: Diagnosis present

## 2023-07-19 DIAGNOSIS — Z8541 Personal history of malignant neoplasm of cervix uteri: Secondary | ICD-10-CM

## 2023-07-19 DIAGNOSIS — Z882 Allergy status to sulfonamides status: Secondary | ICD-10-CM | POA: Diagnosis not present

## 2023-07-19 DIAGNOSIS — I10 Essential (primary) hypertension: Secondary | ICD-10-CM | POA: Diagnosis not present

## 2023-07-19 DIAGNOSIS — F419 Anxiety disorder, unspecified: Secondary | ICD-10-CM | POA: Diagnosis not present

## 2023-07-19 DIAGNOSIS — J9601 Acute respiratory failure with hypoxia: Secondary | ICD-10-CM | POA: Diagnosis not present

## 2023-07-19 DIAGNOSIS — Z7985 Long-term (current) use of injectable non-insulin antidiabetic drugs: Secondary | ICD-10-CM

## 2023-07-19 DIAGNOSIS — M25552 Pain in left hip: Principal | ICD-10-CM | POA: Diagnosis present

## 2023-07-19 DIAGNOSIS — M25452 Effusion, left hip: Principal | ICD-10-CM | POA: Diagnosis present

## 2023-07-19 DIAGNOSIS — Z794 Long term (current) use of insulin: Secondary | ICD-10-CM

## 2023-07-19 DIAGNOSIS — I509 Heart failure, unspecified: Secondary | ICD-10-CM

## 2023-07-19 DIAGNOSIS — N898 Other specified noninflammatory disorders of vagina: Secondary | ICD-10-CM | POA: Diagnosis present

## 2023-07-19 DIAGNOSIS — L02416 Cutaneous abscess of left lower limb: Secondary | ICD-10-CM | POA: Diagnosis not present

## 2023-07-19 DIAGNOSIS — Z86718 Personal history of other venous thrombosis and embolism: Secondary | ICD-10-CM

## 2023-07-19 DIAGNOSIS — X501XXA Overexertion from prolonged static or awkward postures, initial encounter: Secondary | ICD-10-CM | POA: Diagnosis not present

## 2023-07-19 DIAGNOSIS — Z515 Encounter for palliative care: Secondary | ICD-10-CM | POA: Diagnosis not present

## 2023-07-19 DIAGNOSIS — Z833 Family history of diabetes mellitus: Secondary | ICD-10-CM | POA: Diagnosis not present

## 2023-07-19 DIAGNOSIS — I110 Hypertensive heart disease with heart failure: Secondary | ICD-10-CM | POA: Diagnosis not present

## 2023-07-19 DIAGNOSIS — I82402 Acute embolism and thrombosis of unspecified deep veins of left lower extremity: Secondary | ICD-10-CM | POA: Diagnosis not present

## 2023-07-19 DIAGNOSIS — Z7401 Bed confinement status: Secondary | ICD-10-CM

## 2023-07-19 DIAGNOSIS — M898X9 Other specified disorders of bone, unspecified site: Secondary | ICD-10-CM | POA: Diagnosis not present

## 2023-07-19 DIAGNOSIS — Z7189 Other specified counseling: Secondary | ICD-10-CM | POA: Diagnosis not present

## 2023-07-19 DIAGNOSIS — I82412 Acute embolism and thrombosis of left femoral vein: Secondary | ICD-10-CM

## 2023-07-19 DIAGNOSIS — Z79899 Other long term (current) drug therapy: Secondary | ICD-10-CM

## 2023-07-19 DIAGNOSIS — Z89112 Acquired absence of left hand: Secondary | ICD-10-CM

## 2023-07-19 DIAGNOSIS — C210 Malignant neoplasm of anus, unspecified: Secondary | ICD-10-CM | POA: Diagnosis not present

## 2023-07-19 DIAGNOSIS — F32A Depression, unspecified: Secondary | ICD-10-CM | POA: Diagnosis present

## 2023-07-19 DIAGNOSIS — I700 Atherosclerosis of aorta: Secondary | ICD-10-CM | POA: Diagnosis present

## 2023-07-19 DIAGNOSIS — K7460 Unspecified cirrhosis of liver: Secondary | ICD-10-CM | POA: Diagnosis present

## 2023-07-19 DIAGNOSIS — I82409 Acute embolism and thrombosis of unspecified deep veins of unspecified lower extremity: Secondary | ICD-10-CM | POA: Diagnosis present

## 2023-07-19 DIAGNOSIS — E785 Hyperlipidemia, unspecified: Secondary | ICD-10-CM | POA: Diagnosis not present

## 2023-07-19 DIAGNOSIS — M62838 Other muscle spasm: Secondary | ICD-10-CM | POA: Diagnosis present

## 2023-07-19 DIAGNOSIS — Z743 Need for continuous supervision: Secondary | ICD-10-CM | POA: Diagnosis not present

## 2023-07-19 DIAGNOSIS — M13852 Other specified arthritis, left hip: Secondary | ICD-10-CM | POA: Diagnosis present

## 2023-07-19 DIAGNOSIS — Z7901 Long term (current) use of anticoagulants: Secondary | ICD-10-CM

## 2023-07-19 DIAGNOSIS — Z9181 History of falling: Secondary | ICD-10-CM

## 2023-07-19 DIAGNOSIS — Z85048 Personal history of other malignant neoplasm of rectum, rectosigmoid junction, and anus: Secondary | ICD-10-CM | POA: Diagnosis not present

## 2023-07-19 DIAGNOSIS — E1165 Type 2 diabetes mellitus with hyperglycemia: Secondary | ICD-10-CM | POA: Diagnosis not present

## 2023-07-19 DIAGNOSIS — Z7984 Long term (current) use of oral hypoglycemic drugs: Secondary | ICD-10-CM

## 2023-07-19 DIAGNOSIS — N29 Other disorders of kidney and ureter in diseases classified elsewhere: Secondary | ICD-10-CM | POA: Diagnosis present

## 2023-07-19 DIAGNOSIS — E1169 Type 2 diabetes mellitus with other specified complication: Secondary | ICD-10-CM | POA: Diagnosis present

## 2023-07-19 DIAGNOSIS — R278 Other lack of coordination: Secondary | ICD-10-CM | POA: Diagnosis not present

## 2023-07-19 DIAGNOSIS — M6281 Muscle weakness (generalized): Secondary | ICD-10-CM | POA: Diagnosis not present

## 2023-07-19 DIAGNOSIS — I959 Hypotension, unspecified: Secondary | ICD-10-CM | POA: Diagnosis not present

## 2023-07-19 DIAGNOSIS — I5031 Acute diastolic (congestive) heart failure: Secondary | ICD-10-CM | POA: Diagnosis not present

## 2023-07-19 DIAGNOSIS — M25852 Other specified joint disorders, left hip: Secondary | ICD-10-CM | POA: Diagnosis not present

## 2023-07-19 LAB — COMPREHENSIVE METABOLIC PANEL
ALT: 14 U/L (ref 0–44)
AST: 14 U/L — ABNORMAL LOW (ref 15–41)
Albumin: 2.2 g/dL — ABNORMAL LOW (ref 3.5–5.0)
Alkaline Phosphatase: 78 U/L (ref 38–126)
Anion gap: 10 (ref 5–15)
BUN: 11 mg/dL (ref 8–23)
CO2: 28 mmol/L (ref 22–32)
Calcium: 9.2 mg/dL (ref 8.9–10.3)
Chloride: 95 mmol/L — ABNORMAL LOW (ref 98–111)
Creatinine, Ser: 0.51 mg/dL (ref 0.44–1.00)
GFR, Estimated: >60 mL/min (ref >60)
Glucose, Bld: 149 mg/dL — ABNORMAL HIGH (ref 70–99)
Potassium: 2.9 mmol/L — ABNORMAL LOW (ref 3.5–5.1)
Sodium: 133 mmol/L — ABNORMAL LOW (ref 135–145)
Total Bilirubin: 0.4 mg/dL (ref 0.0–1.2)
Total Protein: 6 g/dL — ABNORMAL LOW (ref 6.5–8.1)

## 2023-07-19 LAB — CBC WITH DIFFERENTIAL/PLATELET
Abs Immature Granulocytes: 0.07 10*3/uL (ref 0.00–0.07)
Basophils Absolute: 0.1 10*3/uL (ref 0.0–0.1)
Basophils Relative: 1 %
Eosinophils Absolute: 0 10*3/uL (ref 0.0–0.5)
Eosinophils Relative: 0 %
HCT: 37.1 % (ref 36.0–46.0)
Hemoglobin: 11.6 g/dL — ABNORMAL LOW (ref 12.0–15.0)
Immature Granulocytes: 1 %
Lymphocytes Relative: 7 %
Lymphs Abs: 0.7 10*3/uL (ref 0.7–4.0)
MCH: 26.5 pg (ref 26.0–34.0)
MCHC: 31.3 g/dL (ref 30.0–36.0)
MCV: 84.9 fL (ref 80.0–100.0)
Monocytes Absolute: 0.7 10*3/uL (ref 0.1–1.0)
Monocytes Relative: 6 %
Neutro Abs: 9.3 10*3/uL — ABNORMAL HIGH (ref 1.7–7.7)
Neutrophils Relative %: 85 %
Platelets: 247 10*3/uL (ref 150–400)
RBC: 4.37 MIL/uL (ref 3.87–5.11)
RDW: 14.5 % (ref 11.5–15.5)
WBC: 10.9 10*3/uL — ABNORMAL HIGH (ref 4.0–10.5)
nRBC: 0 % (ref 0.0–0.2)

## 2023-07-19 LAB — CBG MONITORING, ED
Glucose-Capillary: 78 mg/dL (ref 70–99)
Glucose-Capillary: 69 mg/dL — ABNORMAL LOW (ref 70–99)
Glucose-Capillary: 108 mg/dL — ABNORMAL HIGH (ref 70–99)

## 2023-07-19 LAB — LACTIC ACID, PLASMA
Lactic Acid, Venous: 2 mmol/L (ref 0.5–1.9)
Lactic Acid, Venous: 1.5 mmol/L (ref 0.5–1.9)

## 2023-07-19 MED ORDER — MORPHINE SULFATE (PF) 4 MG/ML IV SOLN
2.0000 mg | Freq: Once | INTRAVENOUS | Status: AC
  Administered 2023-07-19: 2 mg via INTRAVENOUS
  Filled 2023-07-19: qty 1

## 2023-07-19 MED ORDER — SODIUM CHLORIDE 0.9 % IV SOLN
2.0000 g | Freq: Once | INTRAVENOUS | Status: AC
  Administered 2023-07-19: 2 g via INTRAVENOUS
  Filled 2023-07-19: qty 12.5

## 2023-07-19 MED ORDER — MORPHINE SULFATE (PF) 4 MG/ML IV SOLN
INTRAVENOUS | Status: AC
  Filled 2023-07-19: qty 1

## 2023-07-19 MED ORDER — VANCOMYCIN HCL 1250 MG/250ML IV SOLN
1250.0000 mg | INTRAVENOUS | Status: DC
  Administered 2023-07-19: 1250 mg via INTRAVENOUS
  Filled 2023-07-19: qty 250

## 2023-07-19 MED ORDER — ONDANSETRON HCL 4 MG/2ML IJ SOLN
4.0000 mg | Freq: Once | INTRAMUSCULAR | Status: AC
  Administered 2023-07-19: 4 mg via INTRAVENOUS
  Filled 2023-07-19: qty 2

## 2023-07-19 MED ORDER — POTASSIUM CHLORIDE CRYS ER 20 MEQ PO TBCR
40.0000 meq | EXTENDED_RELEASE_TABLET | Freq: Once | ORAL | Status: AC
  Administered 2023-07-19: 40 meq via ORAL
  Filled 2023-07-19: qty 2

## 2023-07-19 MED ORDER — MORPHINE SULFATE (PF) 4 MG/ML IV SOLN
4.0000 mg | Freq: Once | INTRAVENOUS | Status: AC
  Administered 2023-07-19: 4 mg via INTRAVENOUS
  Filled 2023-07-19: qty 1

## 2023-07-19 NOTE — ED Provider Notes (Signed)
 Received patient from previous provider.  See her note.  In short, patient presents to emergency department due to abnormal scan on 07/12/2023 ordered by her oncologist for annual cancer surveillance.  She was called today by oncologist regarding results that there is an infection in her hip and to come to ER. Hx of SCC of anus stage III.  CT from 07/12/2023 significant for extensive lytic process with some fluid and gas with possible secondary infection/abscess of left hip.  It is recommended that there may image guided biopsy and aspiration.  Patient placed on cefepime and vancomycin for left hip infection.  IR consulted who agrees to do tap tomorrow at [facility].  Lab work remarkable for WBC of 10.9.  Lactate 2.  BC pending. Consulted hospitalist Dr. Mariea Clonts who accepted patient for admission to [facility] 07/19/23 2007    Durwin Glaze, MD 07/20/23 Georgiann Mohs

## 2023-07-19 NOTE — ED Notes (Signed)
 Pt provided with crackers, sandwich, and juice for low blood sugar.

## 2023-07-19 NOTE — Assessment & Plan Note (Signed)
 Blood sugar down to 69 due to reduced oral intake today. - SSI- M -Hold home Tresiba 15 units daily, Trulicity, NovoLog with meals, Farxiga - HgbA1c

## 2023-07-19 NOTE — Telephone Encounter (Signed)
 Per Dr. Ellin Saba, patient has been advised to go to [facility] ER due to recent CT results.  Patient states that she has no one that can take her.  Will contemplate calling 911 to take her to [facility] if she cannot find someone to make arrangements to get her to [facility].  Results discussed and patient verbalized understanding.

## 2023-07-19 NOTE — Assessment & Plan Note (Signed)
 Stable and compensated.  Last echo 2021 EF 50 to 55%, grade 1 DD.  Left lower extremity swelling likely secondary to DVT and immobility. -Resume Lasix 20 mg daily in a.m.

## 2023-07-19 NOTE — Assessment & Plan Note (Addendum)
 Follows with Dr. 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  Port present.

## 2023-07-19 NOTE — ED Provider Notes (Signed)
 [place] EMERGENCY DEPARTMENT AT [facility] Provider Note   CSN: [id number] Arrival date & time: 07/19/23  1347     History  Chief Complaint  Patient presents with   abnormal diagnostic test    Jacqueline Bird is a 73 y.o. female.  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  Presents to ER for abnormal CT scan.  She had this done on 07/12/2023 ordered by her oncologist Dr. Ellin Saba for annual cancer surveillance.  Called today and told there is an infection in her hip and to come to the ER.  Per the records she was told to go to [facility] but was not able to make it there so came here instead.  She denies fevers or chills.  HPI     Home Medications Prior to Admission medications   Medication Sig Start Date End Date Taking? Authorizing Provider  acetaminophen (TYLENOL) 500 MG tablet Take 1,000-2,000 mg by mouth every 6 (six) hours as needed for moderate pain (pain score 4-6).   Yes [provider]  ALPRAZolam (XANAX) 0.5 MG tablet Take 1 tablet (0.5 mg total) by mouth at bedtime. 06/23/22  Yes Rhetta Mura, MD  atorvastatin (LIPITOR) 80 MG tablet Take 80 mg by mouth daily. 11/29/19  Yes [provider]  enoxaparin (LOVENOX) 60 MG/0.6ML injection Inject 0.6 mLs (60 mg total) into the skin every 12 (twelve) hours. 05/22/23 09/19/23 Yes Shahmehdi, Seyed A, MD  FARXIGA 10 MG TABS tablet Take 1 tablet (10 mg total) by mouth daily. 06/23/22  Yes Rhetta Mura, MD  gabapentin (NEURONTIN) 100 MG capsule Take 1-2 capsules (100-200 mg total) by mouth See admin instructions. Take 200 mg by mouth in the morning and take 100 mg at night. 06/23/22  Yes Rhetta Mura, MD  NOVOLOG FLEXPEN 100 UNIT/ML FlexPen Inject 4 Units into the skin 2 (two) times daily before a meal. 04/06/23  Yes [provider]  sertraline  (ZOLOFT) 100 MG tablet Take 1 tablet (100 mg total) by mouth daily. Patient taking differently: Take 100 mg by mouth daily. Taking with 25 mg to equal total of 125 mg 06/23/22  Yes Rhetta Mura, MD  sertraline (ZOLOFT) 25 MG tablet Take 25 mg by mouth daily. Taking with 100 mg, to equal total dose of 125 mg   Yes [provider]  traMADol (ULTRAM) 50 MG tablet Take 50 mg by mouth 2 (two) times daily as needed. 07/17/23  Yes [provider]  traZODone (DESYREL) 50 MG tablet Take 2 tablets (100 mg total) by mouth at bedtime. 06/23/22  Yes Rhetta Mura, MD  TRESIBA FLEXTOUCH 200 UNIT/ML FlexTouch Pen Inject 15 Units into the skin every morning. 04/06/23  Yes [provider]  TRULICITY 1.5 MG/0.5ML SOAJ Inject 1.5 mg into the skin every Sunday. 04/13/23  Yes [provider]  furosemide (LASIX) 20 MG tablet Take 20 mg by mouth daily.    [provider]      Allergies    Sulfa antibiotics    Review of Systems   Review of Systems  Physical Exam Updated Vital Signs BP 110/71 (BP Location: Right Arm)   Pulse 76   Resp 17   Ht 5\' 4"  (1.626 m)   Wt 64 kg   SpO2 100%   BMI 24.20 kg/m  Physical Exam Vitals and nursing note reviewed.  Constitutional:  General: She is not in acute distress.    Appearance: She is well-developed.  HENT:     Head: Normocephalic and atraumatic.     Mouth/Throat:     Mouth: Mucous membranes are moist.  Eyes:     Conjunctiva/sclera: Conjunctivae normal.  Cardiovascular:     Rate and Rhythm: Normal rate and regular rhythm.     Heart sounds: No murmur heard. Pulmonary:     Effort: Pulmonary effort is normal. No respiratory distress.     Breath sounds: Normal breath sounds.  Abdominal:     Palpations: Abdomen is soft.     Tenderness: There is no abdominal tenderness.  Musculoskeletal:        General: No swelling.     Cervical back: Neck supple.  Skin:    General: Skin is warm and dry.     Capillary  Refill: Capillary refill takes less than 2 seconds.     Comments: No overlying erythema to left hip or abdomen  Neurological:     General: No focal deficit present.     Mental Status: She is alert and oriented to person, place, and time.  Psychiatric:        Mood and Affect: Mood normal.     ED Results / Procedures / Treatments   Labs (all labs ordered are listed, but only abnormal results are displayed) Labs Reviewed - No data to display  EKG None  Radiology No results found.  Procedures Procedures    Medications Ordered in ED Medications - No data to display  ED Course/ Medical Decision Making/ A&P                                 Medical Decision Making This patient presents to the ED for concern of infection left hip noted on CT scan from 2/24, this involves an extensive number of treatment options, and is a complaint that carries with it a high risk of complications and morbidity.  The differential diagnosis includes abscess, cellulitis, septic arthritis, other   Co morbidities that complicate the patient evaluation :   Anal cancer, DVT   Additional history obtained:  Additional history obtained from EMR External records from outside source obtained and reviewed including ER notes, labs and imaging   Lab Tests:  Labs pending at this time     Consultations Obtained:  I requested consultation with the orthopedic surgeon Dr. Dallas Schimke,  and discussed lab and imaging findings as well as pertinent plan - they recommend: Discussed with radiology to see where this would be best performed, can start antibiotics for possible infection   Problem List / ED Course / Critical interventions / Medication management  Here for abnormal CT finding of gas and fluid in the left hip and distraction of the left acetabulum in setting of known cancer.  Patient advised for ER evaluation for this.  She is not having fevers or chills, she has normal range of motion of the hip.  She  is been having pain here but attributed the blood clot she has in that left leg.  She does have some swelling in the leg and tenderness of the thigh but no overlying erythema or warmth  Vancomycin and cefepime ordered empirically for possible infection, I consulted IR and awaiting callback, I have signed this out to I have reviewed the patients home medicines and have made adjustments as needed  Labs and IR consult pending, signed  out to [employer], PA-C. I would anticipate patient for IV antibiotics and aspiration for culture  I was able to speak with Dr. Denny Levy  for IR, he will call back after viewing images    Amount and/or Complexity of Data Reviewed Labs: ordered.  Risk Prescription drug management.           Final Clinical Impression(s) / ED Diagnoses Final diagnoses:  None    Rx / DC Orders ED Discharge Orders     None         Josem Kaufmann 07/19/23 1730    Durwin Glaze, MD 07/20/23 [phone]

## 2023-07-19 NOTE — Assessment & Plan Note (Signed)
 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  Had DVT while on Eliquis, she is currently on Lovenox.  Persistent swelling to left lower extremity.  -Consider curbside consult to vascular surgery if patient would benefit from any therapy to prevent post thrombotic syndrome

## 2023-07-19 NOTE — Assessment & Plan Note (Signed)
 Stable. -Resume Lasix

## 2023-07-19 NOTE — Assessment & Plan Note (Signed)
 Denies vaginal pain or discharge.  CT shows- Small rim enhancing lesion in the region of the right lower vagina not present on prior study and indeterminate finding. - Follow up as outpatient.

## 2023-07-19 NOTE — ED Notes (Signed)
 Provider made aware about patient's blood sugar at this time.

## 2023-07-19 NOTE — H&P (Signed)
 History and Physical    Jacqueline Bird JXB:[id number] DOB: December 18, 1950 DOA: 07/19/2023  PCP: Benita Stabile, MD   Patient coming from: Home  I have personally briefly reviewed patient's old medical records in [facility]  Chief Complaint: Left hip pain  HPI: Jacqueline Bird is a 73 y.o. female with medical history significant for diastolic CHF, DVT, liver cirrhosis, hypertension, diabetes, anal cancer. Patient was called to come to the ED by her oncologist Dr. Ellin Saba, due to abnormal CT results.  Patient had CT done 07/12/2023 that resulted today, showed destroyed left acetabulum, fluid and gas and extensive lytic process possibly secondary to infection/abscess. Patient tells me pain and swelling to her left lower extremity started after she twisted her ankle 05/18/2023.  She presented to the ED and had lower extremity venous Dopplers that was negative for DVT.  Subsequently had another lower extremity venous Doppler 05/21/2023 -showed occlusive deep venous thrombosis throughout the left femoral vein.  She reports continued worsening severe pain, with persistent swelling, is barely able to bear weight on her left leg.  She reports subjective chills, with nausea but no vomiting.  ED Course: Tmax 98.  Heart rate 76-84.  Respirate rate 16-17.  Blood pressure systolic 110-172.  O2 sat greater than 97% on room air. WBC 10.9.  Potassium 2.9.  Lactic acid 2. IV vancomycin and cefepime started. EDP talked to IR Dr. Denny Levy, recommended admission to [facility].  Review of Systems: As per HPI all other systems reviewed and negative.  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   Cervical cancer (HCC)    Depression    DVT (deep venous thrombosis) (HCC)    Essential hypertension    History of cervical cancer    Mixed hyperlipidemia    Type 2 diabetes mellitus [facility])     Past Surgical  History:  Procedure Laterality Date   AIR/FLUID EXCHANGE Left 03/01/2020   Procedure: AIR/FLUID EXCHANGE;  Surgeon: Carmela Rima, MD;  Location: [facility] OR;  Service: Ophthalmology;  Laterality: Left;   CATARACT EXTRACTION, BILATERAL Right 05/2017   CERVICAL CONE BIOPSY     HAND AMPUTATION Left    INJECTION OF SILICONE OIL Left 03/01/2020   Procedure: INJECTION OF SILICONE OIL;  Surgeon: Carmela Rima, MD;  Location: [facility] OR;  Service: Ophthalmology;  Laterality: Left;   INTRAMEDULLARY (IM) NAIL INTERTROCHANTERIC Left 06/17/2022   Procedure: ORIF LEFT HIP INTERTROCHANTERIC FRACTURE;  Surgeon: Tarry Kos, MD;  Location: [facility] OR;  Service: Orthopedics;  Laterality: Left;   PARS PLANA VITRECTOMY Left 03/01/2020   Procedure: PARS PLANA VITRECTOMY WITH 25 GAUGE;  Surgeon: Carmela Rima, MD;  Location: [facility] OR;  Service: Ophthalmology;  Laterality: Left;   PHOTOCOAGULATION WITH LASER Left 03/01/2020   Procedure: PHOTOCOAGULATION WITH LASER;  Surgeon: Carmela Rima, MD;  Location: [facility] OR;  Service: Ophthalmology;  Laterality: Left;   PORTACATH PLACEMENT Left 02/08/2019   Procedure: INSERTION PORT-A-CATH;  Surgeon: Lucretia Roers, MD;  Location: [facility] ORS;  Service: General;  Laterality: Left;   RECTAL BIOPSY  01/26/2019   Procedure: BIOPSY OF ANAL MASS;  Surgeon: Lucretia Roers, MD;  Location: [facility] ORS;  Service: General;;   RECTAL BIOPSY N/A 11/13/2019   Procedure: BIOPSY ANAL AREA;  Surgeon: Lucretia Roers, MD;  Location: [facility] ORS;  Service: General;  Laterality: N/A;   RECTAL EXAM UNDER ANESTHESIA  N/A 01/26/2019   Procedure: RECTAL EXAM UNDER ANESTHESIA;  Surgeon: Lucretia Roers, MD;  Location: [facility] ORS;  Service: General;  Laterality: N/A;   RECTAL EXAM UNDER ANESTHESIA N/A 11/13/2019   Procedure: RECTAL EXAM UNDER ANESTHESIA  WITH ANAL BIOPSY;  Surgeon: Lucretia Roers, MD;  Location: [facility] ORS;  Service: General;  Laterality: N/A;   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  03/01/2020   Procedure: REPAIR OF COMPLEX TRACTION RETINAL DETACHMENT;  Surgeon: Carmela Rima, MD;  Location: [facility] OR;  Service: Ophthalmology;  Laterality: Left;   RETINAL DETACHMENT SURGERY Bilateral 03/11/2020     reports that she has never smoked. She has been exposed to tobacco smoke. She has never used smokeless tobacco. She reports that she does not currently use alcohol. She reports that she does not use drugs.  Allergies  Allergen Reactions   Sulfa Antibiotics Anaphylaxis and Swelling    Per pt, can use creams with sulfa in it Only externally tolerated but not internally    Family History  Problem Relation Age of Onset   Cataracts Mother    Glaucoma Mother    Heart disease Mother    Diabetes Father    Heart attack Father    Cataracts Sister    Lung disease Sister    Cataracts Brother    Diabetes Brother    Heart disease Brother    Macular degeneration Maternal Aunt    Diabetes Maternal Grandfather    Heart disease Sister    Diabetes Sister    Throat cancer Son    Breast cancer Niece    Breast cancer Niece    Amblyopia Neg Hx    Blindness Neg Hx    Retinal detachment Neg Hx    Strabismus Neg Hx    Retinitis pigmentosa Neg Hx     Prior to Admission medications   Medication Sig Start Date End Date Taking? Authorizing Provider  acetaminophen (TYLENOL) 500 MG tablet Take 1,000-2,000 mg by mouth every 6 (six) hours as needed for moderate pain (pain score 4-6).   Yes [provider]  ALPRAZolam (XANAX) 0.5 MG tablet Take 1 tablet (0.5 mg total) by mouth at bedtime. 06/23/22  Yes Rhetta Mura, MD  atorvastatin (LIPITOR) 80 MG tablet Take 80 mg by mouth daily. 11/29/19  Yes [provider]  enoxaparin (LOVENOX) 60 MG/0.6ML injection Inject 0.6 mLs (60 mg total) into the skin every 12 (twelve) hours. 05/22/23 09/19/23 Yes Shahmehdi, Seyed A, MD  FARXIGA 10 MG TABS tablet Take 1 tablet (10 mg total) by mouth daily. 06/23/22  Yes Rhetta Mura, MD   gabapentin (NEURONTIN) 100 MG capsule Take 1-2 capsules (100-200 mg total) by mouth See admin instructions. Take 200 mg by mouth in the morning and take 100 mg at night. 06/23/22  Yes Rhetta Mura, MD  NOVOLOG FLEXPEN 100 UNIT/ML FlexPen Inject 4 Units into the skin 2 (two) times daily before a meal. 04/06/23  Yes [provider]  sertraline (ZOLOFT) 100 MG tablet Take 1 tablet (100 mg total) by mouth daily. Patient taking differently: Take 100 mg by mouth daily. Taking with 25 mg to equal total of 125 mg 06/23/22  Yes Rhetta Mura, MD  sertraline (ZOLOFT) 25 MG tablet Take 25 mg by mouth daily. Taking with 100 mg, to equal total dose of 125 mg   Yes [provider]  traMADol (ULTRAM) 50 MG tablet Take 50 mg by mouth 2 (two) times daily as needed. 07/17/23  Yes [provider]  traZODone (DESYREL) 50 MG tablet Take 2 tablets (100 mg total) by mouth at bedtime. 06/23/22  Yes Rhetta Mura, MD  TRESIBA FLEXTOUCH 200 UNIT/ML FlexTouch Pen Inject 15 Units into the skin every morning. 04/06/23  Yes [provider]  TRULICITY 1.5 MG/0.5ML SOAJ Inject 1.5 mg into the skin every Sunday. 04/13/23  Yes [provider]  furosemide (LASIX) 20 MG tablet Take 20 mg by mouth daily.    [provider]    Physical Exam: Vitals:   07/19/23 1413 07/19/23 1415 07/19/23 1500 07/19/23 1815  BP: 118/64  110/71 (!) 172/103  Pulse: 77  76 84  Resp: 17   16  Temp:    98 F (36.7 C)  TempSrc: Oral   Oral  SpO2: 97%  100% 99%  Weight:  64 kg    Height:  5\' 4"  (1.626 m)      Constitutional: Quite Hard of hearing, calm, comfortable Vitals:   07/19/23 1413 07/19/23 1415 07/19/23 1500 07/19/23 1815  BP: 118/64  110/71 (!) 172/103  Pulse: 77  76 84  Resp: 17   16  Temp:    98 F (36.7 C)  TempSrc: Oral   Oral  SpO2: 97%  100% 99%  Weight:  64 kg    Height:  5\' 4"  (1.626 m)     Eyes: PERRL, lids and conjunctivae normal ENMT: Mucous  membranes are dry.  Neck: normal, supple, no masses, no thyromegaly Respiratory: clear to auscultation bilaterally, no wheezing, no crackles. Normal respiratory effort. No accessory muscle use.  Cardiovascular: Regular rate and rhythm, no murmurs / rubs / gallops.  2+ pitting left lower extremity edema, Extremities warm.  Port o cart, left upper chest Abdomen: no tenderness, no masses palpated. No hepatosplenomegaly. Bowel sounds positive.  Musculoskeletal: no clubbing / cyanosis. No joint deformity upper and lower extremities.  Skin: no rashes, lesions, ulcers. No induration Neurologic: Moving extremities spontaneously, speech fluent. Psychiatric: Normal judgment and insight. Alert and oriented x 3. Normal mood.   Labs on Admission: I have personally reviewed following labs and imaging studies  CBC: Recent Labs  Lab 07/19/23 1705  WBC 10.9*  NEUTROABS 9.3*  HGB 11.6*  HCT 37.1  MCV 84.9  PLT 247   Basic Metabolic Panel: Recent Labs  Lab 07/19/23 1705  NA 133*  K 2.9*  CL 95*  CO2 28  GLUCOSE 149*  BUN 11  CREATININE 0.51  CALCIUM 9.2   GFR: Estimated Creatinine Clearance: 55.7 mL/min (by C-G formula based on SCr of 0.51 mg/dL). Liver Function Tests: Recent Labs  Lab 07/19/23 1705  AST 14*  ALT 14  ALKPHOS 78  BILITOT 0.4  PROT 6.0*  ALBUMIN 2.2*   CBG: Recent Labs  Lab 07/19/23 1545 07/19/23 2028  GLUCAP 78 69*   Radiological Exams on Admission: No results found.  EKG: None   Assessment/Plan Principal Problem:   Left hip pain Active Problems:   Anal cancer (HCC)   CHF (congestive heart failure) (HCC)   Essential hypertension   Type 2 diabetes mellitus with other specified complication (HCC)   Cirrhosis of liver without ascites (HCC)   DVT (deep venous thrombosis) (HCC)   Assessment and Plan: * Left hip pain Left hip pain and swelling of 2 months duration.  CT showing extensive lytic process with fluid and gas, possible infection/abscess.,  destroyed left acetabulum.  Possible cystic/necrotic left-sided pelvic nodes. -IV vancomycin and cefepime started in ED, hold off on further antibiotics, she is well-appearing, barely  any leukocytosis -10.9, afebrile, with no signs of sepsis at this time. - EDP talked to IR Dr. Denny Levy, recommend admission to [facility] -N.p.o. midnight -Hold Lovenox -IV morphine 2 mg every 4 hourly as needed -Obtain blood cultures -Patient sustained left intertrochanteric hip fracture 05/2022 to same hip, and underwent surgery  DVT (deep venous thrombosis) (HCC) Left lower extremity venous Dopplers 05/2023 shows occlusive DVT thrombosis throughout the left femoral vein.  Had DVT while on Eliquis, she is currently on Lovenox.  Persistent swelling to left lower extremity.  -Consider curbside consult to vascular surgery if patient would benefit from any therapy to prevent post thrombotic syndrome  Vaginal lesion Denies vaginal pain or discharge.  CT shows- Small rim enhancing lesion in the region of the right lower vagina not present on prior study and indeterminate finding. - Follow up as outpatient.  Type 2 diabetes mellitus with other specified complication (HCC) Blood sugar down to 69 due to reduced oral intake today. - SSI- M -Hold home Tresiba 15 units daily, Trulicity, NovoLog with meals, Farxiga - HgbA1c  Anal cancer (HCC) Follows with Dr. 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  Port present.  Essential hypertension Stable. -Resume Lasix  CHF (congestive heart failure) (HCC) Stable and compensated.  Last echo 2021 EF 50 to 55%, grade 1 DD.  Left lower extremity swelling likely secondary to DVT and immobility. -Resume Lasix 20 mg daily in a.m.    DVT prophylaxis: SCDS  Code Status: FULL code Family Communication: None at bedside Disposition Plan: > 2 days Consults called: IR Admission status: Inpt Med surg I certify that at the point of  admission it is my clinical judgment that the patient will require inpatient hospital care spanning beyond 2 midnights from the point of admission due to high intensity of service, high risk for further deterioration and high frequency of surveillance required.   Author: Onnie Boer, MD 07/19/2023 9:43 PM  For on call review [email or web address].

## 2023-07-19 NOTE — ED Triage Notes (Signed)
 Pt is a cancer center patient and was called and told to come to ED due to an abnormal CT scan  Pt has no complaints at this time

## 2023-07-19 NOTE — Progress Notes (Signed)
 Received abnormal call report on CTAP from [facility] Radiology.  Dr. Ellin Saba made aware.

## 2023-07-19 NOTE — Progress Notes (Signed)
 Dr. Ellin Saba aware of results.  Per his recommendation, patient was advised to report to [facility] ER.  Verbalized understanding, however states she may only be able to make it to [facility] ER.

## 2023-07-19 NOTE — Progress Notes (Signed)
 Pharmacy Antibiotic Note  Jacqueline Bird is a 73 y.o. female admitted on 07/19/2023 with  osteomyelitis .  Pharmacy has been consulted for Vancomycin dosing.  Plan: Vancomycin 1250mg  mg IV Q 24 hrs. Goal AUC 400-550. Expected AUC: 530 SCr used: 0.8(actual is 0.51) F/U cxs and clinical progress Monitor V/S, labs and levels as indicated   Height: 5\' 4"  (162.6 cm) Weight: 64 kg (141 lb) IBW/kg (Calculated) : 54.7  No data recorded.  No results for input(s): "WBC", "CREATININE", "LATICACIDVEN", "VANCOTROUGH", "VANCOPEAK", "VANCORANDOM", "GENTTROUGH", "GENTPEAK", "GENTRANDOM", "TOBRATROUGH", "TOBRAPEAK", "TOBRARND", "AMIKACINPEAK", "AMIKACINTROU", "AMIKACIN" in the last 168 hours.  Estimated Creatinine Clearance: 55.7 mL/min (by C-G formula based on SCr of 0.51 mg/dL).    Allergies  Allergen Reactions   Sulfa Antibiotics Anaphylaxis and Swelling    Per pt, can use creams with sulfa in it Only externally tolerated but not internally    Antimicrobials this admission: vancomycin 3/3 >>  cefepime 3/3 in ED  Microbiology results: 3/3 BCx: pending  MRSA PCR:   Thank you for allowing pharmacy to be a part of this patient's care.   Elder Cyphers, BS Pharm D, BCPS Clinical Pharmacist 07/19/2023 5:08 PM

## 2023-07-19 NOTE — Assessment & Plan Note (Addendum)
 Left hip pain and swelling of 2 months duration.  CT showing extensive lytic process with fluid and gas, possible infection/abscess., destroyed left acetabulum.  Possible cystic/necrotic left-sided pelvic nodes. -IV vancomycin and cefepime started in ED, hold off on further antibiotics, she is well-appearing, barely any leukocytosis -10.9, afebrile, with no signs of sepsis at this time. - EDP talked to IR Dr. Denny Levy, recommend admission to [facility] -N.p.o. midnight -Hold Lovenox -IV morphine 2 mg every 4 hourly as needed -Obtain blood cultures -Patient sustained left intertrochanteric hip fracture 05/2022 to same hip, and underwent surgery

## 2023-07-19 NOTE — ED Notes (Signed)
 Blood Sugar 108 after snacks.

## 2023-07-20 ENCOUNTER — Ambulatory Visit: Payer: Self-pay | Admitting: Internal Medicine

## 2023-07-20 ENCOUNTER — Inpatient Hospital Stay (HOSPITAL_COMMUNITY)

## 2023-07-20 ENCOUNTER — Encounter (HOSPITAL_COMMUNITY): Payer: Self-pay | Admitting: Internal Medicine

## 2023-07-20 DIAGNOSIS — M25552 Pain in left hip: Secondary | ICD-10-CM | POA: Diagnosis not present

## 2023-07-20 LAB — HEMOGLOBIN A1C
Hgb A1c MFr Bld: 6.3 % — ABNORMAL HIGH (ref 4.8–5.6)
Mean Plasma Glucose: 134.11 mg/dL

## 2023-07-20 MED ORDER — HYDROXYZINE HCL 10 MG PO TABS
10.0000 mg | ORAL_TABLET | Freq: Three times a day (TID) | ORAL | Status: DC | PRN
  Administered 2023-07-20: 10 mg via ORAL
  Filled 2023-07-20: qty 1

## 2023-07-20 MED ORDER — FENTANYL CITRATE (PF) 100 MCG/2ML IJ SOLN
INTRAMUSCULAR | Status: AC | PRN
  Administered 2023-07-20: 25 ug via INTRAVENOUS
  Administered 2023-07-20: 50 ug via INTRAVENOUS
  Administered 2023-07-20: 25 ug via INTRAVENOUS

## 2023-07-20 MED ORDER — MIDAZOLAM HCL 2 MG/2ML IJ SOLN
INTRAMUSCULAR | Status: AC | PRN
Start: 2023-07-20 — End: 2023-07-20
  Administered 2023-07-20 (×2): .5 mg via INTRAVENOUS
  Administered 2023-07-20: 1 mg via INTRAVENOUS
  Administered 2023-07-20: .5 mg via INTRAVENOUS

## 2023-07-20 MED ORDER — FENTANYL CITRATE (PF) 100 MCG/2ML IJ SOLN
INTRAMUSCULAR | Status: AC
  Filled 2023-07-20: qty 2

## 2023-07-20 MED ORDER — SERTRALINE HCL 25 MG PO TABS: 25.0000 mg | ORAL_TABLET | Freq: Every day | ORAL | Status: DC

## 2023-07-20 MED ORDER — LIDOCAINE HCL (PF) 1 % IJ SOLN
10.0000 mL | Freq: Once | INTRAMUSCULAR | Status: AC
  Administered 2023-07-20: 10 mL

## 2023-07-20 MED ORDER — GABAPENTIN 100 MG PO CAPS
100.0000 mg | ORAL_CAPSULE | Freq: Every day | ORAL | Status: DC
Start: 2023-07-20 — End: 2023-07-26
  Administered 2023-07-20 – 2023-07-25 (×6): 100 mg via ORAL
  Filled 2023-07-20 (×6): qty 1

## 2023-07-20 MED ORDER — GABAPENTIN 100 MG PO CAPS
200.0000 mg | ORAL_CAPSULE | Freq: Every day | ORAL | Status: DC
  Administered 2023-07-21 – 2023-07-26 (×6): 200 mg via ORAL
  Filled 2023-07-20 (×6): qty 2

## 2023-07-20 MED ORDER — MIDAZOLAM HCL 2 MG/2ML IJ SOLN
INTRAMUSCULAR | Status: AC
  Filled 2023-07-20: qty 2

## 2023-07-20 MED ORDER — ALPRAZOLAM 0.5 MG PO TABS
0.5000 mg | ORAL_TABLET | Freq: Every day | ORAL | Status: DC
  Administered 2023-07-20 – 2023-07-25 (×6): 0.5 mg via ORAL
  Filled 2023-07-20 (×6): qty 1

## 2023-07-20 MED ORDER — SERTRALINE HCL 25 MG PO TABS
125.0000 mg | ORAL_TABLET | Freq: Every day | ORAL | Status: DC
  Administered 2023-07-20 – 2023-07-26 (×7): 125 mg via ORAL
  Filled 2023-07-20 (×7): qty 1

## 2023-07-20 MED ORDER — ENOXAPARIN SODIUM 60 MG/0.6ML IJ SOSY
60.0000 mg | PREFILLED_SYRINGE | Freq: Two times a day (BID) | INTRAMUSCULAR | Status: DC
  Administered 2023-07-20 – 2023-07-26 (×12): 60 mg via SUBCUTANEOUS
  Filled 2023-07-20 (×12): qty 0.6

## 2023-07-20 MED ORDER — GABAPENTIN 100 MG PO CAPS
100.0000 mg | ORAL_CAPSULE | ORAL | Status: DC
Start: 2023-07-20 — End: 2023-07-20

## 2023-07-20 MED ORDER — DIPHENHYDRAMINE HCL 50 MG/ML IJ SOLN
INTRAMUSCULAR | Status: AC
  Filled 2023-07-20: qty 1

## 2023-07-20 MED ORDER — DIPHENHYDRAMINE HCL 50 MG/ML IJ SOLN
INTRAMUSCULAR | Status: AC | PRN
  Administered 2023-07-20: 50 mg via INTRAVENOUS

## 2023-07-20 MED ORDER — MORPHINE SULFATE (PF) 2 MG/ML IV SOLN: 1.0000 mg | Freq: Once | INTRAVENOUS | Status: DC

## 2023-07-20 NOTE — Procedures (Signed)
 Interventional Radiology Procedure:   Indications: Destructive changes in left hip  Procedure: CT guided left hip aspiration  Findings: Aspirated 2 ml of amber colored fluid from left hip/acetabular collection.  Fluid sent for cytology and culture.   Complications: None     EBL: Minimal  Plan: Send fluid for cytology and culture.   Jens Siems R. Lowella Dandy, MD  Pager: [phone]

## 2023-07-20 NOTE — Progress Notes (Signed)
 PROGRESS NOTE    Jacqueline Bird  ZOX:[id number] DOB: March 02, 1951 DOA: 07/19/2023 PCP: Benita Stabile, MD  Outpatient Specialists:     Brief Narrative:  Patient is a 73 year old female, with past medical history significant for stage III anal cancer, DVT of bilateral lower extremities on Lovenox, hypertension, diabetes mellitus, hyperlipidemia and history of cervical cancer.  Patient follows up with local oncologist Dr. Ellin Saba.  Patient has CT scan of abdomen and pelvis with contrast on 07/12/2023 that revealed destructive changes of left hip.  Patient's oncologist advised patient to come to the hospital for further assessment and management.  07/20/2023: Patient seen.  Patient is a poor historian.  Interventional radiology team has just completed CT-guided aspiration of destructive left hip.  Orthopedic surgery team has been consulted, Dr. Caryn Bee Haddix.  Orthopedic team will see patient tomorrow.  Dr. Jena Gauss is of the opinion that patient may be transferred to [facility] or [facility] to be seen by an orthopedic surgeon that specializes in oncology, as patient may need extensive reconstructive surgery.  Goal of care will also need to be addressed.   Assessment & Plan:   Principal Problem:   Left hip pain Active Problems:   Anal cancer (HCC)   CHF (congestive heart failure) (HCC)   Essential hypertension   Type 2 diabetes mellitus with other specified complication (HCC)   Cirrhosis of liver without ascites (HCC)   DVT (deep venous thrombosis) (HCC)   Vaginal lesion   Left hip pain/destructive changes: -See above documentation. -Patient has undergone CT guided aspiration for biopsy and cultures. -Orthopedic surgery team has been consulted. -Patient may need to be transferred out of this hospital. -Goals of care will need to be addressed. -Follow cultures and cytology.  DVT left lower extremity: -DVT occurred whilst the patient was on Eliquis. -Resume subcutaneous Lovenox  (full dose).  This was discussed with interventional radiology team.  Type 2 diabetes mellitus: -Continue to optimize.  Stage III anal cancer with recent finding of destructive changes left hip: -Discussed with patient's primary oncologist, Dr. Ellin Saba. -According to the oncologist, patient is currently on surveillance. -Patient has undergone CT-guided aspiration of destructive left hip. -Follow biopsy and culture results. -Further management will depend on above.  Hypertension: -Controlled.  Congestive heart failure: -Compensated.  DVT prophylaxis: Resume full dose subcutaneous Lovenox. Code Status: Full code. Family Communication:  Disposition Plan: Patient remains inpatient.   Consultants:  Interventional radiology. Orthopedic surgery.  Procedures:  CT-guided aspiration of destructive left hip.    Antimicrobials:  None.   Subjective: No new complaints.  Objective: Vitals:   07/20/23 1635 07/20/23 1640 07/20/23 1645 07/20/23 1655  BP: 107/88 (!) 119/56 118/61 112/78  Pulse: (!) 105 (!) 104 (!) 103 (!) 102  Resp: 20 20 (!) 22 20  Temp:      TempSrc:      SpO2: 99% 99% 99% 99%  Weight:      Height:        Intake/Output Summary (Last 24 hours) at 07/20/2023 1721 Last data filed at 07/19/2023 1953 Gross per 24 hour  Intake 350.84 ml  Output --  Net 350.84 ml   Filed Weights   07/19/23 1415  Weight: 64 kg    Examination:  General exam: Appears calm and comfortable. Respiratory system: Clear to auscultation.  Cardiovascular system: S1 & S2 heard Gastrointestinal system: Abdomen is soft and nontender. Central nervous system: Awake and alert.  No coherent history from patient.   Extremities: Status post amputation of left  forearm.  Data Reviewed: I have personally reviewed following labs and imaging studies  CBC: Recent Labs  Lab 07/19/23 1705  WBC 10.9*  NEUTROABS 9.3*  HGB 11.6*  HCT 37.1  MCV 84.9  PLT 247   Basic Metabolic  Panel: Recent Labs  Lab 07/19/23 1705  NA 133*  K 2.9*  CL 95*  CO2 28  GLUCOSE 149*  BUN 11  CREATININE 0.51  CALCIUM 9.2   GFR: Estimated Creatinine Clearance: 55.7 mL/min (by C-G formula based on SCr of 0.51 mg/dL). Liver Function Tests: Recent Labs  Lab 07/19/23 1705  AST 14*  ALT 14  ALKPHOS 78  BILITOT 0.4  PROT 6.0*  ALBUMIN 2.2*   No results for input(s): "LIPASE", "AMYLASE" in the last 168 hours. No results for input(s): "AMMONIA" in the last 168 hours. Coagulation Profile: No results for input(s): "INR", "PROTIME" in the last 168 hours. Cardiac Enzymes: No results for input(s): "CKTOTAL", "CKMB", "CKMBINDEX", "TROPONINI" in the last 168 hours. BNP (last 3 results) No results for input(s): "PROBNP" in the last 8760 hours. HbA1C: Recent Labs    07/19/23 1705  HGBA1C 6.3*   CBG: Recent Labs  Lab 07/19/23 1545 07/19/23 2028 07/19/23 2119  GLUCAP 78 69* 108*   Lipid Profile: No results for input(s): "CHOL", "HDL", "LDLCALC", "TRIG", "CHOLHDL", "LDLDIRECT" in the last 72 hours. Thyroid Function Tests: No results for input(s): "TSH", "T4TOTAL", "FREET4", "T3FREE", "THYROIDAB" in the last 72 hours. Anemia Panel: No results for input(s): "VITAMINB12", "FOLATE", "FERRITIN", "TIBC", "IRON", "RETICCTPCT" in the last 72 hours. Urine analysis: No results found for: "COLORURINE", "APPEARANCEUR", "LABSPEC", "PHURINE", "GLUCOSEU", "HGBUR", "BILIRUBINUR", "KETONESUR", "PROTEINUR", "UROBILINOGEN", "NITRITE", "LEUKOCYTESUR" Sepsis Labs: @LABRCNTIP (procalcitonin:4,lacticidven:4)  ) Recent Results (from the past 240 hours)  Blood culture (routine x 2)     Status: None (Preliminary result)   Collection Time: 07/19/23  5:05 PM   Specimen: BLOOD  Result Value Ref Range Status   Specimen Description BLOOD PORT  Final   Special Requests   Final    BOTTLES DRAWN AEROBIC AND ANAEROBIC Blood Culture adequate volume   Culture   Final    NO GROWTH < 24 HOURS Performed  at [facility], [address] [place], [place] [address]    Report Status PENDING  Incomplete  Blood culture (routine x 2)     Status: None (Preliminary result)   Collection Time: 07/19/23  5:15 PM   Specimen: BLOOD  Result Value Ref Range Status   Specimen Description BLOOD RIGHT ANTECUBITAL  Final   Special Requests   Final    BOTTLES DRAWN AEROBIC AND ANAEROBIC Blood Culture results may not be optimal due to an inadequate volume of blood received in culture bottles   Culture   Final    NO GROWTH < 24 HOURS Performed at [facility], [address], [place], [place] [address]    Report Status PENDING  Incomplete         Radiology Studies: No results found.      Scheduled Meds:  ALPRAZolam  0.5 mg Oral QHS   enoxaparin  60 mg Subcutaneous Q12H   gabapentin  100-200 mg Oral See admin instructions    morphine injection  1 mg Intravenous Once   sertraline  25 mg Oral Daily   Continuous Infusions:   LOS: 1 day    Time spent: 35 minutes.    Berton Mount, MD  [facility] Pager #: [phone] 7PM-7AM contact night coverage as above

## 2023-07-20 NOTE — Consult Note (Signed)
 Chief Complaint: Patient was seen in consultation today for left hip pain  at the request of  Barnetta Chapel, MD  Referring Physician(s): Barnetta Chapel, MD  Supervising Physician: Richarda Overlie  Patient Status: [facility] - In-pt  Full Code  History of Present Illness: Jacqueline Bird is a 73 y.o. female with history of depression, CHF, HTN, type 2 diabetes mellitus, hyperlipidemia, liver cirrhosis, recurrent DVT, left hip fracture-s/p left IM nail 05/2022, cervical cancer and anal cancer-CT a/p 5/24 reporting no recurrent or metastatic disease. Patient was informed by her oncologist- Dr. Ellin Saba to report to the ED due to abnormal CT results. The CT was ordered for cancer surveillance.  CT abdomen/pelvis 07/12/23 impression 1. The left acetabulum is destroyed. Extensive lytic process but also containing some fluid and gas. Possible secondary infection/abscess. The left femoral head is intact. This would make septic arthritis very unlikely. Recommend image guided biopsy/aspiration. No other lytic bone lesions are identified. 2. Possible cystic/necrotic left-sided pelvic nodes. 3. New borderline enlarged left-sided retroperitoneal lymph nodes measuring up to 9 mm. This could be inflammatory/reactive given the left pelvic findings. 4. Small rim enhancing lesion in the region of the right lower vagina not present on prior study and indeterminate finding. 5. Once the left acetabular process has been appropriately diagnosed a PET-CT at some point may be helpful further evaluation. 6. Stable medullary nephrocalcinosis. 7. Stable atherosclerotic calcifications involving the aorta and iliac arteries and branch vessels. 8. Aortic atherosclerosis.   Patient initially reported to [facility] ED. She had c/o swelling in the left lower etremity that started late December 2024. Initial doppler US did not reveal a DVT. Repeat US 05/21/23 reported "occlusive deep venous thrombosis throughout  the left femoral  vein." IR was consulted to assist with a aspiration/biopsy due to her most recent CT findings. Patient was subsequently transferred to [place] to assist with the request. Patient reports having left hip and leg pain since last year. She notes previous episodes of vomiting. However, she currently endorses nausea.    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 OR;  Service: Ophthalmology;  Laterality: Left;   CATARACT EXTRACTION, BILATERAL Right 05/2017   CERVICAL CONE BIOPSY     HAND AMPUTATION Left    INJECTION OF SILICONE OIL Left 03/01/2020   Procedure: INJECTION OF SILICONE OIL;  Surgeon: Carmela Rima, MD;  Location: [facility] OR;  Service: Ophthalmology;  Laterality: Left;   INTRAMEDULLARY (IM) NAIL INTERTROCHANTERIC Left 06/17/2022   Procedure: ORIF LEFT HIP INTERTROCHANTERIC FRACTURE;  Surgeon: Tarry Kos, MD;  Location: [facility] OR;  Service: Orthopedics;  Laterality: Left;   PARS PLANA VITRECTOMY Left 03/01/2020   Procedure: PARS PLANA VITRECTOMY WITH 25 GAUGE;  Surgeon: Carmela Rima, MD;  Location: [facility] OR;  Service: Ophthalmology;  Laterality: Left;   PHOTOCOAGULATION WITH LASER Left 03/01/2020   Procedure: PHOTOCOAGULATION WITH LASER;  Surgeon: Carmela Rima, MD;  Location: [facility] OR;  Service: Ophthalmology;  Laterality: Left;   PORTACATH PLACEMENT Left 02/08/2019   Procedure: INSERTION PORT-A-CATH;  Surgeon: Lucretia Roers, MD;  Location: [facility] ORS;  Service: General;  Laterality: Left;   RECTAL  BIOPSY  01/26/2019   Procedure: BIOPSY OF ANAL MASS;  Surgeon: Lucretia Roers, MD;  Location: [facility] ORS;  Service: General;;   RECTAL BIOPSY N/A 11/13/2019   Procedure: BIOPSY ANAL AREA;  Surgeon: Lucretia Roers, MD;  Location: [facility] ORS;  Service: General;  Laterality: N/A;   RECTAL EXAM UNDER ANESTHESIA N/A 01/26/2019   Procedure: RECTAL EXAM UNDER ANESTHESIA;  Surgeon: Lucretia Roers, MD;  Location: [facility] ORS;  Service: General;  Laterality: N/A;   RECTAL EXAM UNDER ANESTHESIA N/A 11/13/2019   Procedure: RECTAL EXAM UNDER ANESTHESIA  WITH ANAL BIOPSY;  Surgeon: Lucretia Roers, MD;  Location: [facility] ORS;  Service: General;  Laterality: N/A;   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 03/01/2020   Procedure: REPAIR OF COMPLEX TRACTION RETINAL DETACHMENT;  Surgeon: Carmela Rima, MD;  Location: [facility] OR;  Service: Ophthalmology;  Laterality: Left;   RETINAL DETACHMENT SURGERY Bilateral 03/11/2020    Allergies: Sulfa antibiotics  Medications: Prior to Admission medications   Medication Sig Start Date End Date Taking? Authorizing Provider  acetaminophen (TYLENOL) 500 MG tablet Take 1,000-2,000 mg by mouth every 6 (six) hours as needed for moderate pain (pain score 4-6).   Yes [provider]  ALPRAZolam (XANAX) 0.5 MG tablet Take 1 tablet (0.5 mg total) by mouth at bedtime. 06/23/22  Yes Rhetta Mura, MD  atorvastatin (LIPITOR) 80 MG tablet Take 80 mg by mouth daily. 11/29/19  Yes [provider]  enoxaparin (LOVENOX) 60 MG/0.6ML injection Inject 0.6 mLs (60 mg total) into the skin every 12 (twelve) hours. 05/22/23 09/19/23 Yes Shahmehdi, Seyed A, MD  FARXIGA 10 MG TABS tablet Take 1 tablet (10 mg total) by mouth daily. 06/23/22  Yes Rhetta Mura, MD  gabapentin (NEURONTIN) 100 MG capsule Take 1-2 capsules (100-200 mg total) by mouth See admin instructions. Take 200 mg by mouth in the morning and take 100 mg at night. 06/23/22  Yes Rhetta Mura, MD   NOVOLOG FLEXPEN 100 UNIT/ML FlexPen Inject 4 Units into the skin 2 (two) times daily before a meal. 04/06/23  Yes [provider]  sertraline (ZOLOFT) 100 MG tablet Take 1 tablet (100 mg total) by mouth daily. Patient taking differently: Take 100 mg by mouth daily. Taking with 25 mg to equal total of 125 mg 06/23/22  Yes Rhetta Mura, MD  sertraline (ZOLOFT) 25 MG tablet Take 25 mg by mouth daily. Taking with 100 mg, to equal total dose of 125 mg   Yes [provider]  traMADol (ULTRAM) 50 MG tablet Take 50 mg by mouth 2 (two) times daily as needed. 07/17/23  Yes [provider]  traZODone (DESYREL) 50 MG tablet Take 2 tablets (100 mg total) by mouth at bedtime. 06/23/22  Yes Rhetta Mura, MD  TRESIBA FLEXTOUCH 200 UNIT/ML FlexTouch Pen Inject 15 Units into the skin every morning. 04/06/23  Yes [provider]  TRULICITY 1.5 MG/0.5ML SOAJ Inject 1.5 mg into the skin every Sunday. 04/13/23  Yes [provider]  furosemide (LASIX) 20 MG tablet Take 20 mg by mouth daily.    [provider]     Family History  Problem Relation Age of Onset   Cataracts Mother    Glaucoma Mother    Heart disease Mother    Diabetes Father  Heart attack Father    Cataracts Sister    Lung disease Sister    Cataracts Brother    Diabetes Brother    Heart disease Brother    Macular degeneration Maternal Aunt    Diabetes Maternal Grandfather    Heart disease Sister    Diabetes Sister    Throat cancer Son    Breast cancer Niece    Breast cancer Niece    Amblyopia Neg Hx    Blindness Neg Hx    Retinal detachment Neg Hx    Strabismus Neg Hx    Retinitis pigmentosa Neg Hx     Social History   Socioeconomic History   Marital status: Single    Spouse name: Not on file   Number of children: 2   Years of education: Not on file   Highest education level: Associate degree: occupational, Scientist, product/process development, or vocational program  Occupational History    Occupation: retired  Tobacco Use   Smoking status: Never    Passive exposure: Past   Smokeless tobacco: Never  Vaping Use   Vaping status: Never Used  Substance and Sexual Activity   Alcohol use: Not Currently   Drug use: Never   Sexual activity: Not Currently  Other Topics Concern   Not on file  Social History Narrative   Pt is retired and currently engaged.   Social Drivers of Corporate investment banker Strain: Low Risk  (07/18/2023)   Overall Financial Resource Strain (CARDIA)    Difficulty of Paying Living Expenses: Not very hard  Food Insecurity: Unknown (07/19/2023)   Hunger Vital Sign    Worried About Running Out of Food in the Last Year: Patient unable to answer    Ran Out of Food in the Last Year: Never true  Transportation Needs: Patient Unable To Answer (07/19/2023)   PRAPARE - Transportation    Lack of Transportation (Medical): Patient unable to answer    Lack of Transportation (Non-Medical): Patient unable to answer  Physical Activity: Unknown (07/18/2023)   Exercise Vital Sign    Days of Exercise per Week: 0 days    Minutes of Exercise per Session: Not on file  Stress: Stress Concern Present (07/18/2023)   [employer] of Occupational Health - Occupational Stress Questionnaire    Feeling of Stress : To some extent  Social Connections: Unknown (07/19/2023)   Social Connection and Isolation Panel [NHANES]    Frequency of Communication with Friends and Family: More than three times a week    Frequency of Social Gatherings with Friends and Family: Patient unable to answer    Attends Religious Services: Patient unable to answer    Active Member of Clubs or Organizations: Patient unable to answer    Attends Banker Meetings: Patient unable to answer    Marital Status: Patient unable to answer      Review of Systems: A 12 point ROS discussed and pertinent positives are indicated in the HPI above.  All other systems are negative.  Review of Systems   Constitutional:  Negative for fever.  Respiratory:  Negative for shortness of breath.   Cardiovascular:  Negative for chest pain.  Gastrointestinal:  Positive for nausea. Negative for vomiting.  Musculoskeletal:        Positive for left leg/hip pain  Psychiatric/Behavioral:  Negative for confusion.     Vital Signs: BP 116/75 (BP Location: Right Arm)   Pulse 96   Temp 98.2 F (36.8 C)   Resp 16   Ht  5\' 4"  (1.626 m)   Wt 141 lb (64 kg)   SpO2 98%   BMI 24.20 kg/m   Advance Care Plan: The advanced care plan/surrogate decision maker was discussed at the time of visit and documented in the medical record.    Physical Exam HENT:     Head: Normocephalic.     Mouth/Throat:     Mouth: Mucous membranes are moist.     Pharynx: Oropharynx is clear.  Cardiovascular:     Rate and Rhythm: Normal rate and regular rhythm.  Pulmonary:     Effort: Pulmonary effort is normal.  Abdominal:     General: Abdomen is flat.  Musculoskeletal:        General: Tenderness (left leg) present. No swelling.     Left lower leg: Edema present.  Skin:    General: Skin is warm.     Findings: Erythema (left lower extremity) present.  Neurological:     General: No focal deficit present.     Mental Status: She is alert and oriented to person, place, and time.  Psychiatric:        Mood and Affect: Mood normal.        Thought Content: Thought content normal.     Imaging: CT Abdomen Pelvis W Contrast Result Date: 07/17/2023 CLINICAL DATA:  Restaging anal cancer.  * Tracking Code: BO * EXAM: CT ABDOMEN AND PELVIS WITH CONTRAST TECHNIQUE: Multidetector CT imaging of the abdomen and pelvis was performed using the standard protocol following bolus administration of intravenous contrast. RADIATION DOSE REDUCTION: This exam was performed according to the departmental dose-optimization program which includes automated exposure control, adjustment of the mA and/or kV according to patient size and/or use of  iterative reconstruction technique. CONTRAST:  OMNIPAQUE IOHEXOL 300 MG/ML  SOLN COMPARISON:  Multiple prior imaging studies. The most recent CT scan 2022-10-29 FINDINGS: Lower chest: The lung bases are clear of acute process. No pleural effusion or pulmonary lesions. The heart is normal in size. No pericardial effusion. The distal esophagus and aorta are unremarkable. Hepatobiliary: No focal liver abnormality is seen. No gallstones, gallbladder wall thickening, or biliary dilatation. Pancreas: Unremarkable. No pancreatic ductal dilatation or surrounding inflammatory changes. Spleen: Normal in size without focal abnormality. Adrenals/Urinary Tract: Stable medullary nephrocalcinosis. No hydronephrosis. No renal or adrenal gland lesions. Stomach/Bowel: The stomach, duodenum, small bowel and colon are grossly. No acute inflammatory process, mass lesions or obstructive findings. Vascular/Lymphatic: Stable atherosclerotic calcifications involving the aorta and iliac arteries and branch vessels. No aneurysm or dissection. The major venous structures are patent. New borderline enlarged left-sided retroperitoneal lymph nodes measuring up to 9 mm. This could be inflammatory/reactive given the left pelvic findings. Reproductive: The uterus is grossly.  No adnexal mass. Other: The left acetabulum is destroyed. Extensive lytic process but also containing some fluid and gas. Possible secondary infection/abscess. The left femoral head is intact. This would make septic arthritis very unlikely. There is surgical hardware in the left hip from a previous fracture. In retrospect, there were some small lytic lesions involving the acetabulum on the prior CT scan. Recommend image guided biopsy/aspiration. Possible cystic/necrotic left-sided pelvic nodes. Small rim enhancing lesion in the region of the right lower vagina not present prior study and indeterminate finding. Musculoskeletal: No other lytic bone lesions are identified.  Remote healed sacral insufficiency fractures. Remote compression fracture of T11. IMPRESSION: 1. The left acetabulum is destroyed. Extensive lytic process but also containing some fluid and gas. Possible secondary infection/abscess. The left femoral head  is intact. This would make septic arthritis very unlikely. Recommend image guided biopsy/aspiration. No other lytic bone lesions are identified. 2. Possible cystic/necrotic left-sided pelvic nodes. 3. New borderline enlarged left-sided retroperitoneal lymph nodes measuring up to 9 mm. This could be inflammatory/reactive given the left pelvic findings. 4. Small rim enhancing lesion in the region of the right lower vagina not present on prior study and indeterminate finding. 5. Once the left acetabular process has been appropriately diagnosed a PET-CT at some point may be helpful further evaluation. 6. Stable medullary nephrocalcinosis. 7. Stable atherosclerotic calcifications involving the aorta and iliac arteries and branch vessels. 8. Aortic atherosclerosis. These results will be called to the ordering clinician or representative by the Radiologist Assistant, and communication documented in the PACS or [employer]. Electronically Signed   By: Rudie Meyer M.D.   On: 07/17/2023 11:22    Labs:  CBC: Recent Labs    10/02/22 1223 05/21/23 2035 07/12/23 1122 07/19/23 1705  WBC 7.9 8.6 12.2* 10.9*  HGB 12.3 12.0 12.2 11.6*  HCT 38.7 39.1 39.2 37.1  PLT 252 369 389 247    COAGS: Recent Labs    05/21/23 2035  INR 1.3*  APTT 45*    BMP: Recent Labs    10/02/22 1223 05/21/23 2035 07/12/23 1122 07/19/23 1705  NA 137 137 131* 133*  K 3.5 3.0* 2.6* 2.9*  CL 102 101 89* 95*  CO2 26 28 31 28   GLUCOSE 98 101* 167* 149*  BUN 11 12 10 11   CALCIUM 8.8* 8.6* 9.9 9.2  CREATININE 0.48 0.52 0.51 0.51  GFRNONAA >60 >60 >60 >60    LIVER FUNCTION TESTS: Recent Labs    10/02/22 1223 05/21/23 2035 07/12/23 1122 07/19/23 1705  BILITOT  0.8 0.6 0.7 0.4  AST 20 27 30  14*  ALT 17 23 23 14   ALKPHOS 94 112 85 78  PROT 7.2 6.8 6.7 6.0*  ALBUMIN 3.3* 2.8* 2.7* 2.2*    TUMOR MARKERS: No results for input(s): "AFPTM", "CEA", "CA199", "CHROMGRNA" in the last 8760 hours.  Assessment and Plan: Left hip pain  Patient is a 73 y/o female with left hip pain. Biopsy/aspiration ordered due to CT findings listed above. Patient is NPO .  Risks and benefits of left hip biopsy/aspiration was discussed with the patient and/or patient's family including, but not limited to bleeding, infection, damage to adjacent structures or low yield requiring additional tests.  All of the questions were answered and there is agreement to proceed.  Consent signed and in chart.   Thank you for this interesting consult.  I greatly enjoyed meeting Jacqueline Bird and look forward to participating in their care.  A copy of this report was sent to the requesting provider on this date.  Electronically Signed: Rosalita Levan, PA 07/20/2023, 4:01 PM   I spent a total of 20 Minutes    in face to face in clinical consultation, greater than 50% of which was counseling/coordinating care for left hip pain

## 2023-07-21 DIAGNOSIS — M25552 Pain in left hip: Secondary | ICD-10-CM | POA: Diagnosis not present

## 2023-07-21 LAB — CBC WITH DIFFERENTIAL/PLATELET
Abs Immature Granulocytes: 0.05 10*3/uL (ref 0.00–0.07)
Basophils Absolute: 0 10*3/uL (ref 0.0–0.1)
Basophils Relative: 0 %
Eosinophils Absolute: 0.1 10*3/uL (ref 0.0–0.5)
Eosinophils Relative: 1 %
HCT: 37.8 % (ref 36.0–46.0)
Hemoglobin: 12 g/dL (ref 12.0–15.0)
Immature Granulocytes: 1 %
Lymphocytes Relative: 7 %
Lymphs Abs: 0.7 10*3/uL (ref 0.7–4.0)
MCH: 26.8 pg (ref 26.0–34.0)
MCHC: 31.7 g/dL (ref 30.0–36.0)
MCV: 84.4 fL (ref 80.0–100.0)
Monocytes Absolute: 0.7 10*3/uL (ref 0.1–1.0)
Monocytes Relative: 6 %
Neutro Abs: 9 10*3/uL — ABNORMAL HIGH (ref 1.7–7.7)
Neutrophils Relative %: 85 %
Platelets: 315 10*3/uL (ref 150–400)
RBC: 4.48 MIL/uL (ref 3.87–5.11)
RDW: 14.8 % (ref 11.5–15.5)
WBC: 10.5 10*3/uL (ref 4.0–10.5)
nRBC: 0 % (ref 0.0–0.2)

## 2023-07-21 LAB — BASIC METABOLIC PANEL
Anion gap: 12 (ref 5–15)
BUN: 11 mg/dL (ref 8–23)
CO2: 26 mmol/L (ref 22–32)
Calcium: 9.4 mg/dL (ref 8.9–10.3)
Chloride: 94 mmol/L — ABNORMAL LOW (ref 98–111)
Creatinine, Ser: 0.8 mg/dL (ref 0.44–1.00)
GFR, Estimated: >60 mL/min (ref >60)
Glucose, Bld: 206 mg/dL — ABNORMAL HIGH (ref 70–99)
Potassium: 4.2 mmol/L (ref 3.5–5.1)
Sodium: 132 mmol/L — ABNORMAL LOW (ref 135–145)

## 2023-07-21 LAB — MAGNESIUM: Magnesium: 1.5 mg/dL — ABNORMAL LOW (ref 1.7–2.4)

## 2023-07-21 LAB — CYTOLOGY - NON PAP

## 2023-07-21 MED ORDER — ACETAMINOPHEN 325 MG PO TABS
650.0000 mg | ORAL_TABLET | Freq: Four times a day (QID) | ORAL | Status: DC | PRN
  Administered 2023-07-25: 650 mg via ORAL
  Filled 2023-07-21: qty 2

## 2023-07-21 MED ORDER — TRAZODONE HCL 50 MG PO TABS
100.0000 mg | ORAL_TABLET | Freq: Every day | ORAL | Status: DC
  Administered 2023-07-21 – 2023-07-25 (×5): 100 mg via ORAL
  Filled 2023-07-21 (×5): qty 2

## 2023-07-21 MED ORDER — ACETAMINOPHEN 650 MG RE SUPP: 650.0000 mg | Freq: Four times a day (QID) | RECTAL | Status: DC | PRN

## 2023-07-21 MED ORDER — MAGNESIUM SULFATE 4 GM/100ML IV SOLN
4.0000 g | Freq: Once | INTRAVENOUS | Status: AC
  Administered 2023-07-21: 4 g via INTRAVENOUS
  Filled 2023-07-21 (×2): qty 100

## 2023-07-21 MED ORDER — ONDANSETRON HCL 4 MG/2ML IJ SOLN: 4.0000 mg | Freq: Four times a day (QID) | INTRAMUSCULAR | Status: DC | PRN

## 2023-07-21 MED ORDER — MORPHINE SULFATE (PF) 2 MG/ML IV SOLN
2.0000 mg | INTRAVENOUS | Status: DC | PRN
  Administered 2023-07-26: 2 mg via INTRAVENOUS
  Filled 2023-07-21: qty 1

## 2023-07-21 MED ORDER — METHOCARBAMOL 500 MG PO TABS
500.0000 mg | ORAL_TABLET | Freq: Four times a day (QID) | ORAL | Status: DC | PRN
  Administered 2023-07-26: 500 mg via ORAL
  Filled 2023-07-21: qty 1

## 2023-07-21 MED ORDER — ONDANSETRON HCL 4 MG PO TABS
4.0000 mg | ORAL_TABLET | Freq: Four times a day (QID) | ORAL | Status: DC | PRN
Start: 2023-07-21 — End: 2023-07-26

## 2023-07-21 MED ORDER — ATORVASTATIN CALCIUM 80 MG PO TABS
80.0000 mg | ORAL_TABLET | Freq: Every day | ORAL | Status: DC
  Administered 2023-07-21 – 2023-07-26 (×6): 80 mg via ORAL
  Filled 2023-07-21 (×6): qty 1

## 2023-07-21 MED ORDER — TRAMADOL HCL 50 MG PO TABS
50.0000 mg | ORAL_TABLET | Freq: Four times a day (QID) | ORAL | Status: DC | PRN
  Administered 2023-07-24 – 2023-07-26 (×4): 50 mg via ORAL
  Filled 2023-07-21 (×4): qty 1

## 2023-07-21 MED ORDER — OXYCODONE HCL 5 MG PO TABS
5.0000 mg | ORAL_TABLET | ORAL | Status: DC | PRN
  Administered 2023-07-21 – 2023-07-26 (×6): 5 mg via ORAL
  Filled 2023-07-21 (×6): qty 1

## 2023-07-21 MED ORDER — SENNOSIDES-DOCUSATE SODIUM 8.6-50 MG PO TABS: 1.0000 | ORAL_TABLET | Freq: Two times a day (BID) | ORAL | Status: DC | PRN

## 2023-07-21 NOTE — Consult Note (Signed)
 Reason for Consult:Left acetabulum lesion Referring Physician: Lynden Oxford Time called: 1191 Time at bedside: 1008   Jacqueline Bird is an 73 y.o. female.  HPI: Jacqueline Bird was directed to come the ED yesterday by her oncologist after worrisome findings on her pelvic x-ray. Workup showed extensive lysis of the left acetabulum and orthopedic surgery was consulted. She notes she's had increasing left hip pain for several weeks and can barely bear weight although the pain is not bad at rest.  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   Cervical cancer (HCC)    Depression    DVT (deep venous thrombosis) (HCC)    Essential hypertension    History of cervical cancer    Mixed hyperlipidemia    Type 2 diabetes mellitus (HCC)     Past Surgical History:  Procedure Laterality Date   AIR/FLUID EXCHANGE Left 03/01/2020   Procedure: AIR/FLUID EXCHANGE;  Surgeon: Carmela Rima, MD;  Location: [facility] OR;  Service: Ophthalmology;  Laterality: Left;   CATARACT EXTRACTION, BILATERAL Right 05/2017   CERVICAL CONE BIOPSY     HAND AMPUTATION Left    INJECTION OF SILICONE OIL Left 03/01/2020   Procedure: INJECTION OF SILICONE OIL;  Surgeon: Carmela Rima, MD;  Location: [facility] OR;  Service: Ophthalmology;  Laterality: Left;   INTRAMEDULLARY (IM) NAIL INTERTROCHANTERIC Left 06/17/2022   Procedure: ORIF LEFT HIP INTERTROCHANTERIC FRACTURE;  Surgeon: Tarry Kos, MD;  Location: [facility] OR;  Service: Orthopedics;  Laterality: Left;   PARS PLANA VITRECTOMY Left 03/01/2020   Procedure: PARS PLANA VITRECTOMY WITH 25 GAUGE;  Surgeon: Carmela Rima, MD;  Location: [facility] OR;  Service: Ophthalmology;  Laterality: Left;   PHOTOCOAGULATION WITH LASER Left 03/01/2020   Procedure: PHOTOCOAGULATION WITH LASER;  Surgeon: Carmela Rima, MD;  Location: [facility] OR;  Service: Ophthalmology;  Laterality: Left;   PORTACATH  PLACEMENT Left 02/08/2019   Procedure: INSERTION PORT-A-CATH;  Surgeon: Lucretia Roers, MD;  Location: [facility] ORS;  Service: General;  Laterality: Left;   RECTAL BIOPSY  01/26/2019   Procedure: BIOPSY OF ANAL MASS;  Surgeon: Lucretia Roers, MD;  Location: [facility] ORS;  Service: General;;   RECTAL BIOPSY N/A 11/13/2019   Procedure: BIOPSY ANAL AREA;  Surgeon: Lucretia Roers, MD;  Location: [facility] ORS;  Service: General;  Laterality: N/A;   RECTAL EXAM UNDER ANESTHESIA N/A 01/26/2019   Procedure: RECTAL EXAM UNDER ANESTHESIA;  Surgeon: Lucretia Roers, MD;  Location: [facility] ORS;  Service: General;  Laterality: N/A;   RECTAL EXAM UNDER ANESTHESIA N/A 11/13/2019   Procedure: RECTAL EXAM UNDER ANESTHESIA  WITH ANAL BIOPSY;  Surgeon: Lucretia Roers, MD;  Location: [facility] ORS;  Service: General;  Laterality: N/A;   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 03/01/2020   Procedure: REPAIR OF COMPLEX TRACTION RETINAL DETACHMENT;  Surgeon: Carmela Rima, MD;  Location: [facility] OR;  Service: Ophthalmology;  Laterality: Left;   RETINAL DETACHMENT SURGERY Bilateral 03/11/2020    Family History  Problem Relation Age of Onset   Cataracts Mother    Glaucoma Mother    Heart disease Mother    Diabetes Father    Heart attack Father    Cataracts Sister    Lung disease Sister    Cataracts Brother    Diabetes Brother    Heart disease Brother    Macular degeneration Maternal Aunt    Diabetes Maternal  Grandfather    Heart disease Sister    Diabetes Sister    Throat cancer Son    Breast cancer Niece    Breast cancer Niece    Amblyopia Neg Hx    Blindness Neg Hx    Retinal detachment Neg Hx    Strabismus Neg Hx    Retinitis pigmentosa Neg Hx     Social History:  reports that she has never smoked. She has been exposed to tobacco smoke. She has never used smokeless tobacco. She reports that she does not currently use alcohol. She reports that she does not use drugs.  Allergies:  Allergies   Allergen Reactions   Sulfa Antibiotics Anaphylaxis and Swelling    Per pt, can use creams with sulfa in it Only externally tolerated but not internally    Medications: I have reviewed the patient's current medications.  Results for orders placed or performed during the hospital encounter of 07/19/23 (from the past 48 hours)  CBG monitoring, ED     Status: None   Collection Time: 07/19/23  3:45 PM  Result Value Ref Range   Glucose-Capillary 78 70 - 99 mg/dL    Comment: Glucose reference range applies only to samples taken after fasting for at least 8 hours.  CBC with Differential     Status: Abnormal   Collection Time: 07/19/23  5:05 PM  Result Value Ref Range   WBC 10.9 (H) 4.0 - 10.5 K/uL   RBC 4.37 3.87 - 5.11 MIL/uL   Hemoglobin 11.6 (L) 12.0 - 15.0 g/dL   HCT 82.9 56.2 - 13.0 %   MCV 84.9 80.0 - 100.0 fL   MCH 26.5 26.0 - 34.0 pg   MCHC 31.3 30.0 - 36.0 g/dL   RDW 86.5 78.4 - 69.6 %   Platelets 247 150 - 400 K/uL   nRBC 0.0 0.0 - 0.2 %   Neutrophils Relative % 85 %   Neutro Abs 9.3 (H) 1.7 - 7.7 K/uL   Lymphocytes Relative 7 %   Lymphs Abs 0.7 0.7 - 4.0 K/uL   Monocytes Relative 6 %   Monocytes Absolute 0.7 0.1 - 1.0 K/uL   Eosinophils Relative 0 %   Eosinophils Absolute 0.0 0.0 - 0.5 K/uL   Basophils Relative 1 %   Basophils Absolute 0.1 0.0 - 0.1 K/uL   Immature Granulocytes 1 %   Abs Immature Granulocytes 0.07 0.00 - 0.07 K/uL    Comment: Performed at [facility], [address] [place], [place] [address]  Comprehensive metabolic panel     Status: Abnormal   Collection Time: 07/19/23  5:05 PM  Result Value Ref Range   Sodium 133 (L) 135 - 145 mmol/L   Potassium 2.9 (L) 3.5 - 5.1 mmol/L   Chloride 95 (L) 98 - 111 mmol/L   CO2 28 22 - 32 mmol/L   Glucose, Bld 149 (H) 70 - 99 mg/dL    Comment: Glucose reference range applies only to samples taken after fasting for at least 8 hours.   BUN 11 8 - 23 mg/dL   Creatinine, Ser 4.13 0.44 - 1.00 mg/dL   Calcium 9.2  8.9 - 24.4 mg/dL   Total Protein 6.0 (L) 6.5 - 8.1 g/dL   Albumin 2.2 (L) 3.5 - 5.0 g/dL   AST 14 (L) 15 - 41 U/L   ALT 14 0 - 44 U/L   Alkaline Phosphatase 78 38 - 126 U/L   Total Bilirubin 0.4 0.0 - 1.2 mg/dL   GFR,  Estimated >60 >60 mL/min    Comment: (NOTE) Calculated using the CKD-EPI Creatinine Equation (2021)    Anion gap 10 5 - 15    Comment: Performed at [facility], [address] [place], [place] [address]  Blood culture (routine x 2)     Status: None (Preliminary result)   Collection Time: 07/19/23  5:05 PM   Specimen: BLOOD  Result Value Ref Range   Specimen Description BLOOD PORT    Special Requests      BOTTLES DRAWN AEROBIC AND ANAEROBIC Blood Culture adequate volume   Culture      NO GROWTH 2 DAYS Performed at [facility], [address] [place], [place] [address]    Report Status PENDING   Lactic acid, plasma     Status: Abnormal   Collection Time: 07/19/23  5:05 PM  Result Value Ref Range   Lactic Acid, Venous 2.0 (HH) 0.5 - 1.9 mmol/L    Comment: CRITICAL RESULT CALLED TO, READ BACK BY AND VERIFIED WITH BELTON,C AT 1820 ON 3.3.25 BY ISLEY,B Performed at [facility], [address] [place], [place] [address]   Hemoglobin A1c     Status: Abnormal   Collection Time: 07/19/23  5:05 PM  Result Value Ref Range   Hgb A1c MFr Bld 6.3 (H) 4.8 - 5.6 %    Comment: (NOTE) Pre diabetes:          5.7%-6.4%  Diabetes:              >6.4%  Glycemic control for   <7.0% adults with diabetes    Mean Plasma Glucose 134.11 mg/dL    Comment: Performed at [facility], [address], [place], [place] [address]  Blood culture (routine x 2)     Status: None (Preliminary result)   Collection Time: 07/19/23  5:15 PM   Specimen: BLOOD  Result Value Ref Range   Specimen Description BLOOD RIGHT ANTECUBITAL    Special Requests      BOTTLES DRAWN AEROBIC AND ANAEROBIC Blood Culture results may not be optimal due to an inadequate volume of blood received in culture  bottles   Culture      NO GROWTH 2 DAYS Performed at [facility], [address], [place], [place] [address]    Report Status PENDING   Lactic acid, plasma     Status: None   Collection Time: 07/19/23  6:58 PM  Result Value Ref Range   Lactic Acid, Venous 1.5 0.5 - 1.9 mmol/L    Comment: Performed at [facility], [address], [place], [place] [address]  CBG monitoring, ED     Status: Abnormal   Collection Time: 07/19/23  8:28 PM  Result Value Ref Range   Glucose-Capillary 69 (L) 70 - 99 mg/dL    Comment: Glucose reference range applies only to samples taken after fasting for at least 8 hours.  CBG monitoring, ED     Status: Abnormal   Collection Time: 07/19/23  9:19 PM  Result Value Ref Range   Glucose-Capillary 108 (H) 70 - 99 mg/dL    Comment: Glucose reference range applies only to samples taken after fasting for at least 8 hours.  Aerobic/Anaerobic Culture w Gram Stain (surgical/deep wound)     Status: None (Preliminary result)   Collection Time: 07/20/23  5:01 PM   Specimen: Abscess  Result Value Ref Range   Specimen Description ABSCESS    Special Requests NONE    Gram Stain  RARE WBC PRESENT, PREDOMINANTLY PMN NO ORGANISMS SEEN    Culture      NO GROWTH < 24 HOURS Performed at [facility], [address] [place], [place] [address]    Report Status PENDING     DG Pelvis Comp Min 3V Result Date: 07/20/2023 CLINICAL DATA:  Known left acetabular lytic lesion, initial encounter EXAM: JUDET PELVIS - 3+ VIEW COMPARISON:  07/12/2023 FINDINGS: Postsurgical changes are noted in the proximal left hip. The roast of changes involving the left acetabulum and superior pubic ramus are faintly visualized laterally. Pelvic ring is otherwise intact. No fracture is noted. IMPRESSION: Lytic lesion involving the acetabulum and lateral aspect of the left superior pubic ramus consistent with the known history. Electronically Signed   By: Alcide Clever M.D.   On: 07/20/2023 21:04    CT GUIDED NEEDLE PLACEMENT Result Date: 07/20/2023 INDICATION: 73 year old with anal cancer. Patient has a destructive lesion involving the left anterior acetabulum and anterior left hip. Need to evaluate for infection or malignancy. EXAM: CT-GUIDED ASPIRATION OF LEFT HIP/ACETABULUM MEDICATIONS: Benadryl 50 mg IV ANESTHESIA/SEDATION: Moderate (conscious) sedation was employed during this procedure. A total of Versed 2.5 mg and fentanyl 100 mcg was administered intravenously at the order of the provider performing the procedure. Total intra-service moderate sedation time: 24 minutes. Patient's level of consciousness and vital signs were monitored continuously by radiology nurse throughout the procedure under the supervision of the provider performing the procedure. COMPLICATIONS: None immediate. PROCEDURE: Informed written consent was obtained from the patient after a thorough discussion of the procedural risks, benefits and alternatives. All questions were addressed. A timeout was performed prior to the initiation of the procedure. Patient was placed supine and CT images were obtained. CT images through the pelvis were obtained. Left lateral hip was prepped with chlorhexidine and sterile field was created. Skin was anesthetized using 1% lidocaine. Small incision was made. Using CT guidance, 17 gauge coaxial needle was directed into the low-density material anterior and just superior to the left hip joint. Approximately 2 mL of amber colored fluid was aspirated. Needle was directed more inferiorly into the anterior left hip joint. A small amount of thick material was aspirated which could be purulent. These fluids were sent for culture and cytology. Bandage placed over the puncture site. RADIATION DOSE REDUCTION: This exam was performed according to the departmental dose-optimization program which includes automated exposure control, adjustment of the mA and/or kV according to patient size and/or use of iterative  reconstruction technique. FINDINGS: Bone destruction along the anterior superior aspect of the left hip joint involving the acetabulum and left ilium. Findings are similar to the exam on 07/12/2023. IMPRESSION: CT-guided aspiration of fluid from the complex left hip/acetabular collection/lesion. Electronically Signed   By: Richarda Overlie M.D.   On: 07/20/2023 17:22    Review of Systems  HENT:  Negative for ear discharge, ear pain, hearing loss and tinnitus.   Eyes:  Negative for photophobia and pain.  Respiratory:  Negative for cough and shortness of breath.   Cardiovascular:  Negative for chest pain.  Gastrointestinal:  Negative for abdominal pain, nausea and vomiting.  Genitourinary:  Negative for dysuria, flank pain, frequency and urgency.  Musculoskeletal:  Positive for arthralgias (Left hip>right). Negative for back pain, myalgias and neck pain.  Neurological:  Negative for dizziness and headaches.  Hematological:  Does not bruise/bleed easily.  Psychiatric/Behavioral:  The patient is not nervous/anxious.    Blood pressure (!) 125/55, pulse 91, temperature 98.1 F (  36.7 C), temperature source Oral, resp. rate 17, height 5\' 4"  (1.626 m), weight 64 kg, SpO2 98%. Physical Exam Constitutional:      General: She is not in acute distress.    Appearance: She is well-developed. She is not diaphoretic.  HENT:     Head: Normocephalic and atraumatic.  Eyes:     General: No scleral icterus.       Right eye: No discharge.        Left eye: No discharge.     Conjunctiva/sclera: Conjunctivae normal.  Cardiovascular:     Rate and Rhythm: Normal rate and regular rhythm.  Pulmonary:     Effort: Pulmonary effort is normal. No respiratory distress.  Musculoskeletal:     Cervical back: Normal range of motion.     Comments: LLE No traumatic wounds, ecchymosis, or rash  Mild TTP hip  No knee or ankle effusion  Knee stable to varus/ valgus and anterior/posterior stress  Sens DPN, SPN, TN intact  Motor  EHL, ext, flex, evers 5/5  DP 2+, PT 2+, No significant edema  Skin:    General: Skin is warm and dry.  Neurological:     Mental Status: She is alert.  Psychiatric:        Mood and Affect: Mood normal.        Behavior: Behavior normal.    Assessment/Plan: Left acetabulum lesion -- Given pain is not bad at rest and she's not interested in aggressive and extensive surgery that would be required plan initial non-operative management of her lesion. Would still advise she see orthopedic oncology in [place] as an outpatient in case her circumstances change or she needs some sort of palliative procedure. She should be TDWB. Multiple medical problems including diastolic CHF, DVT, liver cirrhosis, hypertension, diabetes, and anal cancer -- per primary service    Freeman Caldron, PA-C Orthopedic Surgery [phone] 07/21/2023, 10:25 AM

## 2023-07-21 NOTE — Plan of Care (Signed)

## 2023-07-21 NOTE — Plan of Care (Signed)
  Problem: Education: Goal: Knowledge of General Education information will improve Description: Including pain rating scale, medication(s)/side effects and non-pharmacologic comfort measures Outcome: Not Progressing   Problem: Nutrition: Goal: Adequate nutrition will be maintained Outcome: Progressing   Problem: Activity: Goal: Risk for activity intolerance will decrease Outcome: Not Progressing   Problem: Coping: Goal: Level of anxiety will decrease Outcome: Progressing   Problem: Elimination: Goal: Will not experience complications related to bowel motility Outcome: Progressing

## 2023-07-21 NOTE — Progress Notes (Signed)
 [facility] Progress Note Patient: Gae Bihl FAO:[id number] DOB: 07-24-1950 DOA: 07/19/2023  DOS: the patient was seen and examined on 07/21/2023  Brief Hospital Course: PMH of anal cancer, diastolic CHF, DVT, liver cirrhosis and HTN presented to the hospital with complaints of abnormal CT scan. Had a fall 6 weeks ago and was found to have acetabular destruction with fluid and gas. Initially presented to [facility] and was transferred to [facility]. Eval by orthopedics.  And recommendation is for conservative management. Underwent IR guided biopsy which is negative for any malignant cells and only shows inflammation.  Assessment and plan. Left hip pain with destructive changes. Biopsy negative for any malignancy. Orthopedics recommending conservative measures. They do not think the patient will be a candidate for extensive reconstructive surgery although palliation can be offered. Will follow recommendation for now. Cultures so far have been negative therefore we will be holding off on antibiotics.  History of left leg DVT. Significant edema still present. Continue anticoagulation.  Type of diabetes mellitus. Will manage. Hypomagnesemia hypokalemia. Replaced.  History of anal cancer. Admitting provider discussed with patient's primary oncologist patient is currently on surveillance.  Concern for malignancy metastasizing in the left hip area.  Will monitor.  Chronic HFpEF. Stable.  Monitor.  Muscle spasm. Due to electrolyte abnormality. Robaxin.  Subjective: No nausea no vomiting no fever no chills.  Physical Exam: General: in Mild distress, No Rash Cardiovascular: S1 and S2 Present, No Murmur Respiratory: Good respiratory effort, Bilateral Air entry present. No Crackles, No wheezes Abdomen: Bowel Sound present, No tenderness Extremities: No left leg edema Neuro: Alert and oriented x3, no new focal deficit  Data Reviewed: I have Reviewed nursing  notes, Vitals, and Lab results. Since last encounter, pertinent lab results CBC and BMP   . I have ordered test including CBC and BMP  .   Disposition: Status is: Inpatient Remains inpatient appropriate because: Monitor Ortho recommendation.  PT OT evaluation.   Family Communication: No one at bedside Level of care: Med-Surg   Vitals:   07/21/23 0534 07/21/23 0751 07/21/23 1500 07/21/23 1504  BP:  (!) 125/55 121/87 121/87  Pulse: 88 91 90 63  Resp:  17 17 16   Temp:  98.1 F (36.7 C) 98.1 F (36.7 C) 98.1 F (36.7 C)  TempSrc:  Oral Oral   SpO2:  98% 100% 100%  Weight:      Height:         Author: Lynden Oxford, MD 07/21/2023 6:10 PM  Please look on [email or web address] to find out who is on call.

## 2023-07-22 ENCOUNTER — Encounter (HOSPITAL_COMMUNITY): Payer: Self-pay | Admitting: Internal Medicine

## 2023-07-22 DIAGNOSIS — M25552 Pain in left hip: Secondary | ICD-10-CM | POA: Diagnosis not present

## 2023-07-22 DIAGNOSIS — Z515 Encounter for palliative care: Secondary | ICD-10-CM | POA: Diagnosis not present

## 2023-07-22 DIAGNOSIS — Z7189 Other specified counseling: Secondary | ICD-10-CM | POA: Diagnosis not present

## 2023-07-22 DIAGNOSIS — C210 Malignant neoplasm of anus, unspecified: Secondary | ICD-10-CM | POA: Diagnosis not present

## 2023-07-22 LAB — BASIC METABOLIC PANEL
Anion gap: 8 (ref 5–15)
BUN: 11 mg/dL (ref 8–23)
CO2: 27 mmol/L (ref 22–32)
Calcium: 9.2 mg/dL (ref 8.9–10.3)
Chloride: 98 mmol/L (ref 98–111)
Creatinine, Ser: 0.61 mg/dL (ref 0.44–1.00)
GFR, Estimated: >60 mL/min (ref >60)
Glucose, Bld: 195 mg/dL — ABNORMAL HIGH (ref 70–99)
Potassium: 3.6 mmol/L (ref 3.5–5.1)
Sodium: 133 mmol/L — ABNORMAL LOW (ref 135–145)

## 2023-07-22 LAB — MAGNESIUM: Magnesium: 2.4 mg/dL (ref 1.7–2.4)

## 2023-07-22 MED ORDER — SODIUM CHLORIDE 0.9% FLUSH: 10.0000 mL | INTRAVENOUS | Status: DC | PRN

## 2023-07-22 MED ORDER — CHLORHEXIDINE GLUCONATE CLOTH 2 % EX PADS
6.0000 | MEDICATED_PAD | Freq: Every day | CUTANEOUS | Status: DC
  Administered 2023-07-22 – 2023-07-26 (×5): 6 via TOPICAL

## 2023-07-22 NOTE — Progress Notes (Signed)
 [facility] Progress Note Patient: Jacqueline Bird ZOX:[id number] DOB: Aug 24, 1950 DOA: 07/19/2023  DOS: the patient was seen and examined on 07/22/2023  Brief Hospital Course: PMH of anal cancer, diastolic CHF, DVT, liver cirrhosis and HTN presented to the hospital with complaints of abnormal CT scan. Had a fall 6 weeks ago and was found to have acetabular destruction with fluid and gas. Initially presented to [facility] and was transferred to [facility]. Eval by orthopedics.  And recommendation is for conservative management. Underwent IR guided biopsy which is negative for any malignant cells and only shows inflammation.  Assessment and plan. Left hip pain with destructive changes. Biopsy negative for any malignancy. Orthopedics recommending conservative measures. They do not think the patient will be a candidate for extensive reconstructive surgery although palliation can be offered. Cultures so far have been negative therefore we will be holding off on antibiotics. Discussed with orthopedic as well as ID on 3/6.  Recommendation is for outpatient follow-up with orthopedic oncologist and further workup that only.  Also await for culture clearance for 3 days.  History of left leg DVT. Significant edema still present. Continue anticoagulation.  Type of diabetes mellitus. Continue sliding scale insulin.  Hypomagnesemia hypokalemia. Replaced.  History of anal cancer. Admitting provider discussed with patient's primary oncologist patient is currently on surveillance.  Concern for malignancy metastasizing in the left hip area.  Will monitor.  Chronic HFpEF. Stable.  Monitor.  Muscle spasm. Due to electrolyte abnormality. Robaxin.  Subjective: Pain well-controlled.  Sitting in the chair.  No nausea no vomiting no fever no chills.  Muscle spasm improving.  Physical Exam: In mild distress. S1-S2 present Left leg edema.  Data Reviewed: I have Reviewed nursing  notes, Vitals, and Lab results. Discussed with orthopedic as well as ID. Reviewed CBC and CMP.  Disposition: Status is: Inpatient Remains inpatient appropriate because: Awaiting placement.   Family Communication: No one at bedside discussed with son on the phone. Level of care: Med-Surg   Vitals:   07/22/23 0508 07/22/23 0513 07/22/23 0758 07/22/23 1333  BP: (!) 99/57 115/79 (!) 128/58 (!) 107/42  Pulse: 74 84 79 76  Resp: 18 16 17 18   Temp: 98.5 F (36.9 C) 98.5 F (36.9 C) 98.3 F (36.8 C) 98.2 F (36.8 C)  TempSrc: Oral Oral Oral   SpO2: 99% 92% 97% 97%  Weight:      Height:         Author: Lynden Oxford, MD 07/22/2023 6:56 PM  Please look on [email or web address] to find out who is on call.

## 2023-07-22 NOTE — Evaluation (Signed)
 Physical Therapy Evaluation Patient Details Name: Jacqueline Bird MRN: [id number] DOB: 06-15-1950 Today's Date: 07/22/2023  History of Present Illness  Pt presented 3/3  to [facility] and was transferred to [facility].  Eval by orthopedics and recommendation is for conservative management.  Underwent IR guided biopsy which is negative for any malignant cells and only shows inflammation. Had a fall 6 weeks ago and was found to have acetabular destruction with fluid and gas.  Initially presented to [facility] and was transferred to [facility].  Eval by orthopedics.  And recommendation is for conservative management.  Underwent IR guided biopsy which is negative for any malignant cells and only shows inflammation. PMH of anal cancer, diastolic CHF, DVT, liver cirrhosis and HTN  Clinical Impression  Pt admitted with above diagnosis. Pt limited as she couldn't maintain TDWB left LE for standing or transfer.  Pt relied on friends to assist her PTA and only had assist 3-4 days week.  Has amputation of left UE therefore will be reliant on left PFRW on d/c. Pt states that he mobility has worsened over last month and she was using PFRW and has had falls.  Pt will not be able to use a wheelchair in the home either as she can't wheel with left UE or LE.  Pt will likely need post acute rehab < 3 hours day prior to d/c home and may need to look into A living after her Rehab.  Pt currently with functional limitations due to the deficits listed below (see PT Problem List). Pt will benefit from acute skilled PT to increase their independence and safety with mobility to allow discharge.           If plan is discharge home, recommend the following: A lot of help with walking and/or transfers;Assistance with cooking/housework;Assistance with feeding;A little help with bathing/dressing/bathroom;Assist for transportation;Help with stairs or ramp for entrance   Can travel by private  vehicle   No    Equipment Recommendations None recommended by PT  Recommendations for Other Services       Functional Status Assessment Patient has had a recent decline in their functional status and demonstrates the ability to make significant improvements in function in a reasonable and predictable amount of time.     Precautions / Restrictions Precautions Precautions: Fall Recall of Precautions/Restrictions: Impaired Precaution/Restrictions Comments: Unable to maintain TDWB left LE for transfer Restrictions Weight Bearing Restrictions Per Provider Order: Yes LLE Weight Bearing Per Provider Order: Touchdown weight bearing      Mobility  Bed Mobility Overal bed mobility: Needs Assistance Bed Mobility: Rolling, Sidelying to Sit Rolling: Mod assist, Used rails Sidelying to sit: Mod assist, Used rails, HOB elevated       General bed mobility comments: Needed assist with left LE to come to EOB with use of pad to scoot hips to EOB. Pt assisted as much as able with right UE on rail.    Transfers Overall transfer level: Needs assistance Equipment used: Left platform walker Transfers: Sit to/from Stand, Bed to chair/wheelchair/BSC Sit to Stand: Mod assist, +2 physical assistance, +2 safety/equipment, From elevated surface Stand pivot transfers: Mod assist, +2 physical assistance, +2 safety/equipment         General transfer comment: Pt needed min assist to stand to left PFRW.  Initially pt was maintaining TDWB left LE but as she took pivotal steps to chair pt tending to put more weight on left LE needing assist and mod cues.  Pt also fatigues and flexes at trunk.    Ambulation/Gait               General Gait Details: unable to progress  Stairs            Wheelchair Mobility     Tilt Bed    Modified Rankin (Stroke Patients Only)       Balance Overall balance assessment: Needs assistance Sitting-balance support: No upper extremity supported, Feet  supported Sitting balance-Leahy Scale: Fair     Standing balance support: Bilateral upper extremity supported, During functional activity, Reliant on assistive device for balance Standing balance-Leahy Scale: Poor Standing balance comment: relies on UE support for balance, cannot maintain TDWB left LE without assist and cues.                             Pertinent Vitals/Pain Pain Assessment Pain Assessment: 0-10 Pain Score: 7  Pain Location: left hip Pain Descriptors / Indicators: Aching, Grimacing, Guarding, Discomfort Pain Intervention(s): Limited activity within patient's tolerance, Monitored during session, Repositioned    Home Living Family/patient expects to be discharged to:: Private residence Living Arrangements: Other (Comment) (Friend 3-4 days week  - 7-8 hours day) Available Help at Discharge: Friend(s);Available PRN/intermittently Type of Home: House Home Access: Stairs to enter Entrance Stairs-Rails: Right;Left;Can reach both Entrance Stairs-Number of Steps: 2   Home Layout: One level Home Equipment: Agricultural consultant (2 wheels);Shower seat;Cane - quad;Grab bars - tub/shower;Wheelchair - manual (platform left UE)      Prior Function Prior Level of Function : Independent/Modified Independent             Mobility Comments: Independent with household ambulation with cane and then progressed to platform RW and then couldnt walk at all ADLs Comments: Indep with ADLs, has occasional assist with household IADLs from hired personnel. Son will assist with heavier yard work tasks, Catering manager as needed     Extremity/Trunk Assessment   Upper Extremity Assessment Upper Extremity Assessment: Defer to OT evaluation    Lower Extremity Assessment Lower Extremity Assessment: LLE deficits/detail LLE Deficits / Details: Incr edema in LE but appears grossly 3-/5 LLE: Unable to fully assess due to pain       Communication   Communication Communication:  Impaired Factors Affecting Communication: Hearing impaired    Cognition Arousal: Alert Behavior During Therapy: Flat affect   PT - Cognitive impairments: Orientation, Problem solving, Safety/Judgement, Sequencing   Orientation impairments: Time [place] Spaniel March 9 but did get March)                   PT - Cognition Comments: Overall attempts at safety and attempts to maintain TDWB left LE Following commands: Intact       Cueing       General Comments General comments (skin integrity, edema, etc.): Edema left LE entire LE    Exercises General Exercises - Lower Extremity Ankle Circles/Pumps: AROM, Both, 5 reps, Supine Quad Sets: AROM, Both, 10 reps, Supine Gluteal Sets: AROM, Both, 10 reps, Supine Long Arc Quad: AROM, Both, 5 reps, Seated   Assessment/Plan    PT Assessment Patient needs continued PT services  PT Problem List Decreased activity tolerance;Decreased balance;Decreased strength;Decreased range of motion;Decreased mobility;Decreased knowledge of use of DME;Decreased safety awareness;Decreased knowledge of precautions;Pain       PT Treatment Interventions DME instruction;Gait training;Functional mobility training;Therapeutic activities;Therapeutic exercise;Balance training;Patient/family education;Wheelchair mobility training    PT Goals (Current goals can be  found in the Care Plan section)  Acute Rehab PT Goals Patient Stated Goal: to get stronger PT Goal Formulation: With patient Time For Goal Achievement: 08/05/23 Potential to Achieve Goals: Good    Frequency Min 2X/week     Co-evaluation PT/OT/SLP Co-Evaluation/Treatment: Yes Reason for Co-Treatment: Complexity of the patient's impairments (multi-system involvement);For patient/therapist safety PT goals addressed during session: Mobility/safety with mobility         AM-PAC PT "6 Clicks" Mobility  Outcome Measure Help needed turning from your back to your side while in a flat bed without using  bedrails?: A Lot Help needed moving from lying on your back to sitting on the side of a flat bed without using bedrails?: A Lot Help needed moving to and from a bed to a chair (including a wheelchair)?: Total Help needed standing up from a chair using your arms (e.g., wheelchair or bedside chair)?: Total Help needed to walk in hospital room?: Total Help needed climbing 3-5 steps with a railing? : Total 6 Click Score: 8    End of Session Equipment Utilized During Treatment: Gait belt Activity Tolerance: Patient limited by fatigue Patient left: in chair;with call bell/phone within reach;with chair alarm set;with nursing/sitter in room Nurse Communication: Mobility status PT Visit Diagnosis: Unsteadiness on feet (R26.81);Muscle weakness (generalized) (M62.81);Pain Pain - Right/Left: Left Pain - part of body: Hip    Time: 8657-8469 PT Time Calculation (min) (ACUTE ONLY): 33 min   Charges:   PT Evaluation $PT Eval Moderate Complexity: 1 Mod   PT General Charges $$ ACUTE PT VISIT: 1 Visit         Amoree Newlon M,PT Acute Rehab Services [phone]   Bevelyn Buckles 07/22/2023, 11:23 AM

## 2023-07-22 NOTE — Consult Note (Signed)
 Consultation Note Date: 07/22/2023   Patient Name: Jacqueline Bird  DOB: Aug 20, 1950  MRN: [id number]  Age / Sex: 73 y.o., female  PCP: Benita Stabile, MD Referring Physician: Rolly Salter, MD  Reason for Consultation: Establishing goals of care  HPI/Patient Profile: 73 y.o. female  with past medical history of HFpEF, LLE DVT (on Eliquis), liver cirrhosis, HTN, diabetes, anal cancer stage 3c (under surveillance with Dr. Ellin Saba) admitted on 07/19/2023 with advised to come to ED by Dr. Ellin Saba after receiving CT results with evidence of acetabular destruction with fluid and gas. Orthopedics recommending conservative medical management with outpatient follow up. Concern for metastasis in left hip area.   Clinical Assessment and Goals of Care: Consult received and chart review completed. I discussed with Dr. Allena Katz. I met today with Ms. Vowels along with her best friend Rene Kocher and Regina's daughter at bedside. Ms. Edgley allows conversation with her friends present. Ms. Vorce is hard of hearing which has likely impacted her ability to follow what the medical team has been telling her. We reviewed imaging and work up. We discussed concern for metastatic disease in left hip area. The medical team does not believe that this is infection but possibly related to cancer. We reviewed hesitation to jump into surgery until we know more about what we are dealing with. We discussed next steps to pursue rehab and follow up with Dr. Ellin Saba and likely PET scan. Plans to follow up with orthopedics outpatient so they can provide recommendations and guidance depending on progress and findings.   We further reviewed her wishes and Advance Directives. She tells me that she has had a copy of Advance Directives but has not completed. I provided her with another copy and educated that we can complete here or likely can be completed at  follow up appointments. I asked Ms. Tallon if there are any interventions that she knows she would not want. She does tell me that she is a [place] and is "ready to go." I asked her if her heart stopped tomorrow if she would desire resuscitation with CPR and life support/breathing machine and she is clear she would not want this. She agrees with DNR/DNI. I encouraged that she should share her wishes with her family/children and she tells me that they do not like talking about this. She is okay with [place] sharing her wishes with her son and daughter. Otherwise she desires ongoing treatment and work up to help optimize her quantity and quality of life as they are both important to her. She wishes to know her options moving forward.   All questions/concerns addressed. Emotional support provided.   Primary Decision Maker PATIENT    SUMMARY OF RECOMMENDATIONS   - DNR/DNI - Continue treatment and work up  Code Status/Advance Care Planning: DNR   Symptom Management:  Per attending  Prognosis:  Unable to determine  Discharge Planning: Skilled Nursing Facility for rehab with Palliative care service follow-up      Primary Diagnoses: Present on Admission:  Left hip pain  Anal cancer (HCC)  Cirrhosis of liver without ascites (HCC)  Essential hypertension  Type 2 diabetes mellitus with other specified complication (HCC)  DVT (deep venous thrombosis) (HCC)  Vaginal lesion   I have reviewed the medical record, interviewed the patient and family, and examined the patient. The following aspects are pertinent.  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   Cervical cancer (HCC)    Depression    DVT (deep venous thrombosis) (HCC)    Essential hypertension    History of cervical cancer    Mixed hyperlipidemia    Type 2 diabetes mellitus (HCC)    Social History   Socioeconomic  History   Marital status: Single    Spouse name: Not on file   Number of children: 2   Years of education: Not on file   Highest education level: Associate degree: occupational, Scientist, product/process development, or vocational program  Occupational History   Occupation: retired  Tobacco Use   Smoking status: Never    Passive exposure: Past   Smokeless tobacco: Never  Vaping Use   Vaping status: Never Used  Substance and Sexual Activity   Alcohol use: Not Currently   Drug use: Never   Sexual activity: Not Currently  Other Topics Concern   Not on file  Social History Narrative   Pt is retired and currently engaged.   Social Drivers of Corporate investment banker Strain: Low Risk  (07/18/2023)   Overall Financial Resource Strain (CARDIA)    Difficulty of Paying Living Expenses: Not very hard  Food Insecurity: Unknown (07/19/2023)   Hunger Vital Sign    Worried About Running Out of Food in the Last Year: Patient unable to answer    Ran Out of Food in the Last Year: Never true  Transportation Needs: Patient Unable To Answer (07/19/2023)   PRAPARE - Transportation    Lack of Transportation (Medical): Patient unable to answer    Lack of Transportation (Non-Medical): Patient unable to answer  Physical Activity: Unknown (07/18/2023)   Exercise Vital Sign    Days of Exercise per Week: 0 days    Minutes of Exercise per Session: Not on file  Stress: Stress Concern Present (07/18/2023)   [employer] of Occupational Health - Occupational Stress Questionnaire    Feeling of Stress : To some extent  Social Connections: Unknown (07/19/2023)   Social Connection and Isolation Panel [NHANES]    Frequency of Communication with Friends and Family: More than three times a week    Frequency of Social Gatherings with Friends and Family: Patient unable to answer    Attends Religious Services: Patient unable to answer    Active Member of Clubs or Organizations: Patient unable to answer    Attends Banker  Meetings: Patient unable to answer    Marital Status: Patient unable to answer   Family History  Problem Relation Age of Onset   Cataracts Mother    Glaucoma Mother    Heart disease Mother    Diabetes Father    Heart attack Father    Cataracts Sister    Lung disease Sister    Cataracts Brother    Diabetes Brother    Heart disease Brother    Macular degeneration Maternal Aunt    Diabetes Maternal Grandfather    Heart disease Sister    Diabetes Sister  Throat cancer Son    Breast cancer Niece    Breast cancer Niece    Amblyopia Neg Hx    Blindness Neg Hx    Retinal detachment Neg Hx    Strabismus Neg Hx    Retinitis pigmentosa Neg Hx    Scheduled Meds:  ALPRAZolam  0.5 mg Oral QHS   atorvastatin  80 mg Oral Q1200   Chlorhexidine Gluconate Cloth  6 each Topical Daily   enoxaparin  60 mg Subcutaneous Q12H   gabapentin  200 mg Oral Daily   And   gabapentin  100 mg Oral QHS   sertraline  125 mg Oral Daily   traZODone  100 mg Oral QHS   Continuous Infusions: PRN Meds:.acetaminophen **OR** acetaminophen, methocarbamol, morphine injection, ondansetron **OR** ondansetron (ZOFRAN) IV, oxyCODONE, senna-docusate, traMADol Allergies  Allergen Reactions   Sulfa Antibiotics Anaphylaxis and Swelling    Per pt, can use creams with sulfa in it Only externally tolerated but not internally   Review of Systems  Constitutional:  Positive for activity change and appetite change.  Neurological:  Positive for weakness.    Physical Exam Vitals and nursing note reviewed.  Constitutional:      General: She is not in acute distress.    Appearance: She is ill-appearing.  Cardiovascular:     Rate and Rhythm: Normal rate.  Pulmonary:     Effort: No tachypnea, accessory muscle usage or respiratory distress.  Abdominal:     General: Abdomen is flat.  Neurological:     Mental Status: She is alert and oriented to person, place, and time.     Comments: Hard of hearing Poor recall but  likely due to hearing deficit     Vital Signs: BP (!) 128/58 (BP Location: Right Arm)   Pulse 79   Temp 98.3 F (36.8 C) (Oral)   Resp 17   Ht 5\' 4"  (1.626 m)   Wt 64 kg   SpO2 97%   BMI 24.20 kg/m  Pain Scale: 0-10 POSS *See Group Information*: 1-Acceptable,Awake and alert Pain Score: 5    SpO2: SpO2: 97 % O2 Device:SpO2: 97 % O2 Flow Rate: .O2 Flow Rate (L/min): 2 L/min  IO: Intake/output summary:  Intake/Output Summary (Last 24 hours) at 07/22/2023 1128 Last data filed at 07/22/2023 1610 Gross per 24 hour  Intake 240 ml  Output 150 ml  Net 90 ml    LBM: Last BM Date : 07/20/23 Baseline Weight: Weight: 64 kg Most recent weight: Weight: 64 kg     Palliative Assessment/Data:    Time Total: 75 min  Greater than 50%  of this time was spent counseling and coordinating care related to the above assessment and plan.  Signed by: Yong Channel, NP Palliative Medicine Team Pager # [phone] (M-F 8a-5p) Team Phone # [phone] (Nights/Weekends)

## 2023-07-22 NOTE — Plan of Care (Signed)

## 2023-07-22 NOTE — Evaluation (Signed)
 Occupational Therapy Evaluation Patient Details Name: Jacqueline Bird MRN: [id number] DOB: 05/27/1950 Today's Date: 07/22/2023   History of Present Illness   Pt is a 73 y.o female presented to ED 07/19/23 by her oncologist after findings on pelvic x-ray. CT scan showed destructive changes of L hip. Orthopedic recommended conservative management. TDWB. PMH: cervical cancer, diastolic CHF, DVT, liver cirrhosis, L wrist disarticulation, DM and HTN     Clinical Impressions Pt admitted based on above, and was seen based on problem list below. PTA pt was mod I with ADLs using AE, and was receiving assistance with IADLs. Today pt is requiring assistance set up to mod assist +2 for ADLs. Bed mobility and functional transfers are mod assist +2. Impaired balance, and inability to maintain wt bearing status during functional transfers impacting ability for safe d/c home. Recommendation of <3 hours of skilled rehab daily to optimize independence levels. OT will continue to follow acutely to maximize functional independence.      If plan is discharge home, recommend the following:   A lot of help with walking and/or transfers;A lot of help with bathing/dressing/bathroom;Assistance with cooking/housework     Functional Status Assessment   Patient has had a recent decline in their functional status and demonstrates the ability to make significant improvements in function in a reasonable and predictable amount of time.     Equipment Recommendations   BSC/3in1;Other (comment) (Defer to next venue)     Recommendations for Other Services         Precautions/Restrictions   Precautions Precautions: Fall Recall of Precautions/Restrictions: Impaired Precaution/Restrictions Comments: Unable to maintain TDWB left LE for transfer Restrictions Weight Bearing Restrictions Per Provider Order: Yes LLE Weight Bearing Per Provider Order: Touchdown weight bearing     Mobility Bed Mobility Overal bed  mobility: Needs Assistance Bed Mobility: Rolling, Sidelying to Sit Rolling: Mod assist, Used rails Sidelying to sit: Mod assist, Used rails, HOB elevated       General bed mobility comments: Needed assist with left LE to come to EOB with use of pad to scoot hips to EOB. Pt assisted as much as able with right UE on rail.    Transfers Overall transfer level: Needs assistance Equipment used: Left platform walker Transfers: Sit to/from Stand, Bed to chair/wheelchair/BSC Sit to Stand: Mod assist, +2 physical assistance, +2 safety/equipment, From elevated surface Stand pivot transfers: Mod assist, +2 physical assistance, +2 safety/equipment         General transfer comment: Able to recall wt bearing status, unable to maintain it during functional mobility, tends to flex trunk, increased time d/t pain      Balance Overall balance assessment: Needs assistance Sitting-balance support: No upper extremity supported, Feet supported Sitting balance-Leahy Scale: Fair     Standing balance support: Bilateral upper extremity supported, During functional activity, Reliant on assistive device for balance Standing balance-Leahy Scale: Poor Standing balance comment: relies on UE support for balance, cannot maintain TDWB left LE without assist and cues.                           ADL either performed or assessed with clinical judgement   ADL Overall ADL's : Needs assistance/impaired Eating/Feeding: Set up;Bed level   Grooming: Set up;Sitting   Upper Body Bathing: Bed level;Moderate assistance Upper Body Bathing Details (indicate cue type and reason): Assist to bath RUE & back at bed level, uses a wash mit at baseline Lower Body Bathing: Set up;Bed level  Upper Body Dressing : Minimal assistance;Bed level Upper Body Dressing Details (indicate cue type and reason): Assist to don RUE Lower Body Dressing: Moderate assistance;Sitting/lateral leans Lower Body Dressing Details (indicate  cue type and reason): Uses AE at baseline for socks Toilet Transfer: Moderate assistance;+2 for physical assistance;+2 for safety/equipment;Cueing for safety;Cueing for sequencing;Stand-pivot;Rolling walker (2 wheels) (L platform walker)           Functional mobility during ADLs: Moderate assistance;+2 for safety/equipment;+2 for physical assistance;Cueing for safety;Cueing for sequencing;Rolling walker (2 wheels) (L platform walker) General ADL Comments: Uses AE for ADLs at baseline     Vision Baseline Vision/History: 0 No visual deficits Vision Assessment?: No apparent visual deficits     Perception         Praxis         Pertinent Vitals/Pain Pain Assessment Pain Assessment: 0-10 Pain Score: 7  Pain Location: left hip Pain Descriptors / Indicators: Aching, Grimacing, Guarding, Discomfort Pain Intervention(s): Limited activity within patient's tolerance, Monitored during session, Premedicated before session, Repositioned     Extremity/Trunk Assessment Upper Extremity Assessment Upper Extremity Assessment: LUE deficits/detail;Right hand dominant;RUE deficits/detail RUE Sensation: history of peripheral neuropathy LUE Deficits / Details: L wrist disarticulation   Lower Extremity Assessment Lower Extremity Assessment: Defer to PT evaluation LLE Deficits / Details: Incr edema in LE but appears grossly 3-/5 LLE: Unable to fully assess due to pain   Cervical / Trunk Assessment Cervical / Trunk Assessment: Normal   Communication Communication Communication: Impaired Factors Affecting Communication: Hearing impaired   Cognition Arousal: Alert Behavior During Therapy: Flat affect                                 Following commands: Intact       Cueing  General Comments      Edema LLE   Exercises     Shoulder Instructions      Home Living Family/patient expects to be discharged to:: Private residence Living Arrangements: Other (Comment)  (Friend 3-4 days a week 7-8 hour days) Available Help at Discharge: Friend(s);Available PRN/intermittently Type of Home: House Home Access: Stairs to enter [employer] of Steps: 2 Entrance Stairs-Rails: Can reach both Home Layout: One level     Bathroom Shower/Tub: Walk-in shower;Tub/shower unit   Bathroom Toilet: Standard Bathroom Accessibility: Yes How Accessible: Accessible via walker Home Equipment: Rolling Walker (2 wheels);Shower seat;Cane - quad;Grab bars - tub/shower;Wheelchair - manual (L platform walker, sock aid, and washmit for bathing)          Prior Functioning/Environment Prior Level of Function : Independent/Modified Independent             Mobility Comments: Independent with household ambulation with cane and then progressed to platform RW and then couldnt walk at all ADLs Comments: Mod I ADLs with use of AE, has occasional assist with household IADLs from hired personnel. Son will assist with heavier yard work tasks, etc as needed    OT Problem List: Decreased strength;Decreased range of motion;Decreased activity tolerance;Impaired balance (sitting and/or standing)   OT Treatment/Interventions: Self-care/ADL training;Therapeutic exercise;Energy conservation;DME and/or AE instruction;Therapeutic activities;Patient/family education;Balance training      OT Goals(Current goals can be found in the care plan section)   Acute Rehab OT Goals Patient Stated Goal: To be in less pain OT Goal Formulation: With patient Time For Goal Achievement: 08/05/23 Potential to Achieve Goals: Good   OT Frequency:  Min 2X/week    Co-evaluation  PT/OT/SLP Co-Evaluation/Treatment: Yes Reason for Co-Treatment: Complexity of the patient's impairments (multi-system involvement);For patient/therapist safety PT goals addressed during session: Mobility/safety with mobility OT goals addressed during session: ADL's and self-care;Strengthening/ROM      AM-PAC OT "6  Clicks" Daily Activity     Outcome Measure Help from another person eating meals?: A Little Help from another person taking care of personal grooming?: A Little Help from another person toileting, which includes using toliet, bedpan, or urinal?: A Lot Help from another person bathing (including washing, rinsing, drying)?: A Lot Help from another person to put on and taking off regular upper body clothing?: A Little Help from another person to put on and taking off regular lower body clothing?: A Lot 6 Click Score: 15   End of Session Equipment Utilized During Treatment: Gait belt;Other (comment) (L platform walker) Nurse Communication: Mobility status  Activity Tolerance: Patient limited by pain Patient left: in chair;with chair alarm set;with call bell/phone within reach  OT Visit Diagnosis: Unsteadiness on feet (R26.81);Other abnormalities of gait and mobility (R26.89);Muscle weakness (generalized) (M62.81)                Time: 1610-9604 OT Time Calculation (min): 30 min Charges:  OT General Charges $OT Visit: 1 Visit OT Evaluation $OT Eval Moderate Complexity: 1 Mod  Ivor Messier, OT  Acute Rehabilitation Services Office [phone] Secure chat preferred   Marilynne Drivers 07/22/2023, 12:03 PM

## 2023-07-23 DIAGNOSIS — M25552 Pain in left hip: Secondary | ICD-10-CM | POA: Diagnosis not present

## 2023-07-23 LAB — BASIC METABOLIC PANEL
Anion gap: 5 (ref 5–15)
BUN: 9 mg/dL (ref 8–23)
CO2: 29 mmol/L (ref 22–32)
Calcium: 8.4 mg/dL — ABNORMAL LOW (ref 8.9–10.3)
Chloride: 99 mmol/L (ref 98–111)
Creatinine, Ser: 0.53 mg/dL (ref 0.44–1.00)
GFR, Estimated: >60 mL/min (ref >60)
Glucose, Bld: 174 mg/dL — ABNORMAL HIGH (ref 70–99)
Potassium: 3.3 mmol/L — ABNORMAL LOW (ref 3.5–5.1)
Sodium: 133 mmol/L — ABNORMAL LOW (ref 135–145)

## 2023-07-23 LAB — MAGNESIUM: Magnesium: 2 mg/dL (ref 1.7–2.4)

## 2023-07-23 NOTE — NC FL2 (Signed)
 [place] MEDICAID FL2 LEVEL OF CARE FORM     IDENTIFICATION  Patient Name: Jacqueline Bird Birthdate: 08-28-1950 Sex: female Admission Date (Current Location): 07/19/2023  [facility] and [name] Number:  Producer, television/film/video and Address:  [facility] [place], [address], [place], [place] [address]      Provider Number: [id number]  Attending Physician Name and Address:  Rolly Salter, MD  Relative Name and Phone Number:  Timothy Lasso Daughter   [phone]    Current Level of Care: Hospital Recommended Level of Care: Skilled Nursing Facility Prior Approval Number:    Date Approved/Denied:   PASRR Number: [id number]  Discharge Plan: SNF    Current Diagnoses: Patient Active Problem List   Diagnosis Date Noted   Left hip pain 07/19/2023   Vaginal lesion 07/19/2023   Closed fracture of left hip (HCC) 06/15/2022   Closed comminuted intertrochanteric fracture of left femur (HCC) 06/14/2022   Hypoalbuminemia due to protein-calorie malnutrition (HCC) 06/14/2022   Type 2 diabetes mellitus with hyperglycemia (HCC) 06/14/2022   Mixed hyperlipidemia 06/14/2022   Elevated lipase 06/14/2022   Obesity (BMI 30-39.9) 06/14/2022   DVT (deep venous thrombosis) (HCC) 06/14/2022   Cirrhosis of liver without ascites (HCC) 06/24/2021   Pleural effusion, right    Acute diastolic CHF (congestive heart failure) (HCC) 05/23/2019   Acute respiratory failure with hypoxia (HCC) 05/23/2019   Hypokalemia 05/23/2019   Essential hypertension 05/23/2019   Type 2 diabetes mellitus with other specified complication (HCC) 05/23/2019   Anxiety 05/23/2019   CHF (congestive heart failure) (HCC) 05/21/2019   Anal cancer (HCC) 02/01/2019   Mass of anus 01/24/2019   Presbycusis of both ears 03/18/2017   Sudden hearing loss, left 03/18/2017    Orientation RESPIRATION BLADDER Height & Weight     Self, Time, Place  Normal Continent, External catheter Weight: 141 lb (64 kg) Height:   5\' 4"  (162.6 cm)  BEHAVIORAL SYMPTOMS/MOOD NEUROLOGICAL BOWEL NUTRITION STATUS      Continent Diet (see discharge summary)  AMBULATORY STATUS COMMUNICATION OF NEEDS Skin   Total Care Verbally Normal                       Personal Care Assistance Level of Assistance  Bathing, Feeding, Dressing Bathing Assistance: Limited assistance Feeding assistance: Limited assistance Dressing Assistance: Limited assistance     Functional Limitations Info  Sight, Hearing, Speech Sight Info: Adequate Hearing Info: Impaired Speech Info: Adequate    SPECIAL CARE FACTORS FREQUENCY  PT (By licensed PT), OT (By licensed OT)     PT Frequency: 5x week OT Frequency: 5x week            Contractures Contractures Info: Not present    Additional Factors Info  Code Status, Allergies Code Status Info: DNR Allergies Info: sulfa antibiotics           Current Medications (07/23/2023):  This is the current hospital active medication list Current Facility-Administered Medications  Medication Dose Route Frequency Provider Last Rate Last Admin   acetaminophen (TYLENOL) tablet 650 mg  650 mg Oral Q6H PRN Rolly Salter, MD       Or   acetaminophen (TYLENOL) suppository 650 mg  650 mg Rectal Q6H PRN Rolly Salter, MD       ALPRAZolam Prudy Feeler) tablet 0.5 mg  0.5 mg Oral QHS Berton [place], MD   0.5 mg at 07/22/23 2051   atorvastatin (LIPITOR) tablet 80 mg  80 mg Oral  [id number] Rolly Salter, MD   80 mg at 07/22/23 1152   Chlorhexidine Gluconate Cloth 2 % PADS 6 each  6 each Topical Daily Rolly Salter, MD   6 each at 07/22/23 1030   enoxaparin (LOVENOX) injection 60 mg  60 mg Subcutaneous Q12H Berton [place], MD   60 mg at 07/22/23 2051   gabapentin (NEURONTIN) capsule 200 mg  200 mg Oral Daily Berton [place], MD   200 mg at 07/22/23 0102   And   gabapentin (NEURONTIN) capsule 100 mg  100 mg Oral QHS Berton [place], MD   100 mg at 07/22/23 2051   methocarbamol (ROBAXIN) tablet  500 mg  500 mg Oral Q6H PRN Rolly Salter, MD       morphine (PF) 2 MG/ML injection 2 mg  2 mg Intravenous Q2H PRN Rolly Salter, MD       ondansetron [facility]) tablet 4 mg  4 mg Oral Q6H PRN Rolly Salter, MD       Or   ondansetron [facility]) injection 4 mg  4 mg Intravenous Q6H PRN Rolly Salter, MD       oxyCODONE (Oxy IR/ROXICODONE) immediate release tablet 5 mg  5 mg Oral Q4H PRN Rolly Salter, MD   5 mg at 07/22/23 2050   senna-docusate (Senokot-S) tablet 1 tablet  1 tablet Oral BID PRN Rolly Salter, MD       sertraline (ZOLOFT) tablet 125 mg  125 mg Oral Daily Berton [place], MD   125 mg at 07/22/23 7253   sodium chloride flush (NS) 0.9 % injection 10-40 mL  10-40 mL Intracatheter PRN Rolly Salter, MD       traMADol Janean Sark) tablet 50 mg  50 mg Oral Q6H PRN Rolly Salter, MD       traZODone (DESYREL) tablet 100 mg  100 mg Oral QHS Rolly Salter, MD   100 mg at 07/22/23 2051     Discharge Medications: Please see discharge summary for a list of discharge medications.  Relevant Imaging Results:  Relevant Lab Results:   Additional Information SS#: [id number]  Lorri Frederick, LCSW

## 2023-07-23 NOTE — TOC Initial Note (Addendum)
 Transition of Care [facility]) - Initial/Assessment Note    Patient Details  Name: Jacqueline Bird MRN: [id number] Date of Birth: May 02, 1951  Transition of Care [facility]) CM/SW Contact:    Lorri Frederick, LCSW Phone Number: 07/23/2023, 10:29 AM  Clinical Narrative:     CSW met with pt, son [name], [place], and daughter [name] to discuss PT recommendations for SNF.  Pt having problems with her hearing aids, frustrated, not able to participate in the conversation when CSW attempted to speak with her she finally said to just discuss with her children.  They are in agreement with plan for SNF, medicare choice document provided, permission given to send out referral in hub.  Pt from home alone, does have HH aide support a couple days per week.  Per family, pt declined recent attempts to set her up at ALF.  Family asking for assistance with healthcare POA, CSW spoke with Chaplain, who will follow up.   Referral sent out in hub for SNF.          1400: son and DIL asking about ALF options, hospice options potentially for after SNF.  ALF list provided.  CSW spoke with Shawn/[facility] and he was available to come answer questions about hospice serivces.    Bed offers also provided.  THey are asking for response from [facility].  CSW reached out to that facility.       Expected Discharge Plan: Skilled Nursing Facility Barriers to Discharge: Continued Medical Work up, SNF Pending bed offer   Patient Goals and CMS Choice   CMS Medicare.gov Compare Post Acute Care list provided to:: Patient Represenative (must comment) (Daughter [name], son [name]) Choice offered to / list presented to : Adult Children      Expected Discharge Plan and Services In-house Referral: Clinical Social Work   Post Acute Care Choice: Skilled Nursing Facility Living arrangements for the past 2 months: Single Family Home                                      Prior Living Arrangements/Services Living arrangements for  the past 2 months: Single Family Home Lives with:: Self Patient language and need for interpreter reviewed:: Yes        Need for Family Participation in Patient Care: Yes (Comment) Care giver support system in place?: Yes (comment) Current home services: Homehealth aide (several times per week) Criminal Activity/Legal Involvement Pertinent to Current Situation/Hospitalization: No - Comment as needed  Activities of Daily Living   ADL Screening (condition at time of admission) Independently performs ADLs?: Yes (appropriate for developmental age) Is the patient deaf or have difficulty hearing?: Yes Does the patient have difficulty seeing, even when wearing glasses/contacts?: No Does the patient have difficulty concentrating, remembering, or making decisions?: No  Permission Sought/Granted                  Emotional Assessment Appearance:: Appears stated age Attitude/Demeanor/Rapport: Engaged Affect (typically observed): Irritable Orientation: : Oriented to Self, Oriented to Place, Oriented to  Time      Admission diagnosis:  Left hip pain [M25.552] Patient Active Problem List   Diagnosis Date Noted   Left hip pain 07/19/2023   Vaginal lesion 07/19/2023   Closed fracture of left hip (HCC) 06/15/2022   Closed comminuted intertrochanteric fracture of left femur (HCC) 06/14/2022   Hypoalbuminemia due to protein-calorie malnutrition (HCC) 06/14/2022   Type 2 diabetes mellitus with  hyperglycemia (HCC) 06/14/2022   Mixed hyperlipidemia 06/14/2022   Elevated lipase 06/14/2022   Obesity (BMI 30-39.9) 06/14/2022   DVT (deep venous thrombosis) (HCC) 06/14/2022   Cirrhosis of liver without ascites (HCC) 06/24/2021   Pleural effusion, right    Acute diastolic CHF (congestive heart failure) (HCC) 05/23/2019   Acute respiratory failure with hypoxia (HCC) 05/23/2019   Hypokalemia 05/23/2019   Essential hypertension 05/23/2019   Type 2 diabetes mellitus with other specified  complication (HCC) 05/23/2019   Anxiety 05/23/2019   CHF (congestive heart failure) (HCC) 05/21/2019   Anal cancer (HCC) 02/01/2019   Mass of anus 01/24/2019   Presbycusis of both ears 03/18/2017   Sudden hearing loss, left 03/18/2017   PCP:  Benita Stabile, MD Pharmacy:   [facility] - [place], [place] - [address] [place] [address] Phone: [phone] Fax: [phone]     Social Drivers of Health (SDOH) Social History: SDOH Screenings   Food Insecurity: Unknown (07/19/2023)  Housing: Unknown (07/19/2023)  Transportation Needs: Patient Unable To Answer (07/19/2023)  Utilities: Patient Unable To Answer (07/19/2023)  Depression (PHQ2-9): High Risk (02/05/2022)  Financial Resource Strain: Low Risk  (07/18/2023)  Physical Activity: Unknown (07/18/2023)  Social Connections: Unknown (07/19/2023)  Stress: Stress Concern Present (07/18/2023)  Tobacco Use: Low Risk  (07/19/2023)   SDOH Interventions:     Readmission Risk Interventions     No data to display

## 2023-07-23 NOTE — Plan of Care (Signed)
  Problem: Education: Goal: Knowledge of General Education information will improve Description: Including pain rating scale, medication(s)/side effects and non-pharmacologic comfort measures Outcome: Progressing   Problem: Health Behavior/Discharge Planning: Goal: Ability to manage health-related needs will improve Outcome: Progressing   Problem: Activity: Goal: Risk for activity intolerance will decrease Outcome: Progressing   Problem: Coping: Goal: Level of anxiety will decrease Outcome: Progressing   Problem: Elimination: Goal: Will not experience complications related to bowel motility Outcome: Progressing   

## 2023-07-23 NOTE — Progress Notes (Signed)
   [facility] Liaison Note:  Notified by [facility] manager of patient/family request for [facility] Palliative services at home after discharge.   Please call with any hospice or outpatient palliative care related questions.   Thank you for the opportunity to participate in this patient's care.   Glenna Fellows, BSN, RN, OCN [employer] [phone]

## 2023-07-23 NOTE — Progress Notes (Signed)
 Chaplain responded to page for AD paperwork. Pt's family present but pt asleep at the time. Paperwork filled out and ready to be notarized. Chaplain will follow up.

## 2023-07-23 NOTE — Progress Notes (Signed)
 [facility] Progress Note Patient: Jacqueline Bird LKG:[id number] DOB: 03/16/1951 DOA: 07/19/2023  DOS: the patient was seen and examined on 07/23/2023  Brief Hospital Course: PMH of anal cancer, diastolic CHF, DVT, liver cirrhosis and HTN presented to the hospital with complaints of abnormal CT scan. Had a fall 6 weeks ago and was found to have acetabular destruction with fluid and gas. Initially presented to [facility] and was transferred to [facility]. Eval by orthopedics.  And recommendation is for conservative management. Underwent IR guided biopsy which is negative for any malignant cells and only shows inflammation. Currently plan is for pain control and outpatient follow-up versus palliative care.  Assessment and plan. Left hip pain with destructive changes. Biopsy negative for any malignancy. Orthopedics recommending conservative measures. They do not think the patient will be a candidate for extensive reconstructive surgery although palliation can be offered. Cultures so far have been negative therefore we will be holding off on antibiotics. Discussed with orthopedic as well as ID on 3/6.  Recommendation is for outpatient follow-up with orthopedic oncologist and further workup that only.  Aspiration cultures were negative for at least 3 days.  History of left leg DVT. Significant edema still present. Continue anticoagulation.  Type of diabetes mellitus. Continue sliding scale insulin.  Hypomagnesemia hypokalemia. Replaced.  History of anal cancer. Admitting provider discussed with patient's primary oncologist patient is currently on surveillance.  Concern for malignancy metastasizing in the left hip area.  Will monitor.  Chronic HFpEF. Stable.  Monitor.  Muscle spasm. Due to electrolyte abnormality. Robaxin.  Goals of care conversation. Palliative care was consulted. Patient with prior history of anal cancer with failure to thrive outpatient Per  family patient has poor quality of life lately. She lives alone all by herself. Patient told palliative care that she would like to be DNR. Family members were at bedside, we had a conversation in front of the patient about goals of care.  Prognosis was guarded given her bedbound status for now. Recommendations were discussed. Family is leaning more towards comfort approach but would like to attempt SNF if possible with adequate pain control. Will monitor.  Subjective: Patient is sleepy and drowsy.  No nausea no vomiting no fever no chills.  Although when woke up was pleasant j and in ovial mood.  Physical Exam: In no distress. Appears to be drowsy. Clear to auscultation with S1-S2 present for Unchanged edema in the left lower extremity. No new focal deficit.  Data Reviewed: I have Reviewed nursing notes, Vitals, and Lab results. Reviewed CBC and CMP.  Disposition: Status is: Inpatient Remains inpatient appropriate because: Awaiting placement.   Family Communication: Multiple family numbers at bedside.  Level of care: Med-Surg   Vitals:   07/22/23 2012 07/23/23 0402 07/23/23 1326 07/23/23 1636  BP: (!) 117/48 (!) 117/49 (!) 133/52 (!) 114/52  Pulse: 87 77 81 85  Resp: 17 18 16 16   Temp: 98.2 F (36.8 C) 98 F (36.7 C) 97.7 F (36.5 C)   TempSrc:  Oral Oral   SpO2: 94% 96% 96% 97%  Weight:      Height:         Author: Lynden Oxford, MD 07/23/2023 5:58 PM  Please look on [email or web address] to find out who is on call.

## 2023-07-24 DIAGNOSIS — M25552 Pain in left hip: Secondary | ICD-10-CM | POA: Diagnosis not present

## 2023-07-24 LAB — CULTURE, BLOOD (ROUTINE X 2)
Culture: NO GROWTH
Culture: NO GROWTH
Special Requests: ADEQUATE

## 2023-07-24 LAB — MAGNESIUM: Magnesium: 1.7 mg/dL (ref 1.7–2.4)

## 2023-07-24 LAB — BASIC METABOLIC PANEL
Anion gap: 7 (ref 5–15)
BUN: 6 mg/dL — ABNORMAL LOW (ref 8–23)
CO2: 28 mmol/L (ref 22–32)
Calcium: 8.2 mg/dL — ABNORMAL LOW (ref 8.9–10.3)
Chloride: 100 mmol/L (ref 98–111)
Creatinine, Ser: 0.46 mg/dL (ref 0.44–1.00)
GFR, Estimated: >60 mL/min (ref >60)
Glucose, Bld: 137 mg/dL — ABNORMAL HIGH (ref 70–99)
Potassium: 3.5 mmol/L (ref 3.5–5.1)
Sodium: 135 mmol/L (ref 135–145)

## 2023-07-24 MED ORDER — PANTOPRAZOLE SODIUM 40 MG PO TBEC
40.0000 mg | DELAYED_RELEASE_TABLET | Freq: Two times a day (BID) | ORAL | Status: DC
  Administered 2023-07-24 – 2023-07-26 (×4): 40 mg via ORAL
  Filled 2023-07-24 (×4): qty 1

## 2023-07-24 NOTE — Progress Notes (Signed)
 [facility] Progress Note Patient: Jacqueline Bird WJX:[id number] DOB: [birth year] DOA: 07/19/2023  DOS: the patient was seen and examined on 07/24/2023  Brief Hospital Course: PMH of anal cancer, diastolic CHF, DVT, liver cirrhosis and HTN presented to the hospital with complaints of abnormal CT scan. Had a fall 6 weeks ago and was found to have acetabular destruction with fluid and gas. Initially presented to [facility] and was transferred to [facility]. Eval by orthopedics.  And recommendation is for conservative management. Underwent IR guided biopsy which is negative for any malignant cells and only shows inflammation. Currently plan is for pain control and outpatient follow-up versus palliative care.   Assessment and plan. Left hip pain with destructive changes. Biopsy negative for any malignancy. Orthopedics recommending conservative measures. They do not think the patient will be a candidate for extensive reconstructive surgery although palliation can be offered. Cultures so far have been negative therefore we will be holding off on antibiotics. Discussed with orthopedic as well as ID on 3/6.  Recommendation is for outpatient follow-up with orthopedic oncologist and further workup there only.  Aspiration cultures were negative for any active infection.   History of left leg DVT. Significant edema still present. Continue anticoagulation.   Type of diabetes mellitus. Continue sliding scale insulin.   Hypomagnesemia hypokalemia. Replaced.   History of anal cancer. Admitting provider discussed with patient's primary oncologist patient is currently on surveillance.  Concern for malignancy metastasizing in the left hip area.  Will monitor.   Chronic HFpEF. Stable.  Monitor.   Muscle spasm. Due to electrolyte abnormality. Robaxin.   Goals of care conversation. Palliative care was consulted. Patient with prior history of anal cancer with failure to thrive  outpatient Per family patient has poor quality of life lately. She lives alone all by herself. Patient told palliative care that she would like to be DNR. Family members were at bedside, we had a conversation in front of the patient about goals of care.  Prognosis was guarded given her bedbound status for now. Recommendations were discussed. Family is leaning more towards comfort approach but would like to attempt SNF if possible with adequate pain control. Will monitor.   Subjective: Pain well-controlled.  No nausea no vomiting.  Reports that she is freezing.  Physical Exam: Clear to auscultation. Bowel sound present. Unchanged left leg edema.  Data Reviewed: I have Reviewed nursing notes, Vitals, and Lab results.  Disposition: Status is: Inpatient Remains inpatient appropriate because: Pain control, awaiting placement.  Likely can go next week.  Family Communication: No one at bedside Level of care: Med-Surg   Vitals:   07/23/23 1954 07/24/23 0507 07/24/23 0508 07/24/23 1307  BP: (!) 105/45 (!) 108/47 (!) 102/42 (!) 133/50  Pulse: 85 78 80 84  Resp: 18 17 17 16   Temp: 97.8 F (36.6 C) 98.6 F (37 C) 98.6 F (37 C) 98.6 F (37 C)  TempSrc:  Oral Oral Oral  SpO2: 97% 98% 98% 95%  Weight:      Height:         Author: Lynden Oxford, MD 07/24/2023 5:39 PM  Please look on [email or web address] to find out who is on call.

## 2023-07-24 NOTE — Hospital Course (Signed)
 PMH of anal cancer, diastolic CHF, DVT, liver cirrhosis and HTN presented to the hospital with complaints of abnormal CT scan. Had a fall 6 weeks ago and was found to have acetabular destruction with fluid and gas. Initially presented to [facility] and was transferred to [facility]. Eval by orthopedics.  And recommendation is for conservative management. Underwent IR guided biopsy which is negative for any malignant cells and only shows inflammation. Currently plan is for pain control and outpatient follow-up versus palliative care.   Assessment and plan. Left hip pain with destructive changes. Biopsy negative for any malignancy. Orthopedics recommending conservative measures. They do not think the patient will be a candidate for extensive reconstructive surgery although palliation can be offered. Cultures so far have been negative therefore we will be holding off on antibiotics. Discussed with orthopedic as well as ID on 3/6.  Recommendation is for outpatient follow-up with orthopedic oncologist and further workup there only.  Aspiration cultures were negative for any active infection.   History of left leg DVT. Significant edema still present. Continue anticoagulation.   Type of diabetes mellitus. Continue sliding scale insulin.   Hypomagnesemia hypokalemia. Replaced.   History of anal cancer. Admitting provider discussed with patient's primary oncologist patient is currently on surveillance.  Concern for malignancy metastasizing in the left hip area.  Will monitor.   Chronic HFpEF. Stable.  Monitor.   Muscle spasm. Due to electrolyte abnormality. Robaxin.   Goals of care conversation. Palliative care was consulted. Patient with prior history of anal cancer with failure to thrive outpatient Per family patient has poor quality of life lately. She lives alone all by herself. Patient told palliative care that she would like to be DNR. Family members were at  bedside, we had a conversation in front of the patient about goals of care.  Prognosis was guarded given her bedbound status for now. Recommendations were discussed. Family is leaning more towards comfort approach but would like to attempt SNF if possible with adequate pain control. Will monitor.

## 2023-07-24 NOTE — TOC Progression Note (Addendum)
 Transition of Care [facility]) - Initial/Assessment Note    Patient Details  Name: Jacqueline Bird MRN: [id number] Date of Birth: 1950-07-05  Transition of Care [facility]) CM/SW Contact:    [name], LCSW Phone Number: 07/24/2023, 1:57 PM  Clinical Narrative:                 CSW spoke with [name] at [facility].  The facility can accept patient if family and patient agreeable.  CSW contacted patient's daughter, Jacqueline Bird, and [name] requesting a returned call.    15:20- CSW received returned call from patient's daughter, Jacqueline Bird.  Family accepted bed offer at [facility].  [facility] notified, but does not have a bed available this weekend.  CSW requested insurance authorization.    Insurance auth pending.    TOC following.  TOC following.    Expected Discharge Plan: Skilled Nursing Facility Barriers to Discharge: Continued Medical Work up, SNF Pending bed offer   Patient Goals and CMS Choice   CMS Medicare.gov Compare Post Acute Care list provided to:: Patient Represenative (must comment) (Daughter Jacqueline Bird, son Jacqueline Bird) Choice offered to / list presented to : Adult Children      Expected Discharge Plan and Services In-house Referral: Clinical Social Work   Post Acute Care Choice: Skilled Nursing Facility Living arrangements for the past 2 months: Single Family Home                                      Prior Living Arrangements/Services Living arrangements for the past 2 months: Single Family Home Lives with:: Self Patient language and need for interpreter reviewed:: Yes        Need for Family Participation in Patient Care: Yes (Comment) Care giver support system in place?: Yes (comment) Current home services: Homehealth aide (several times per week) Criminal Activity/Legal Involvement Pertinent to Current Situation/Hospitalization: No - Comment as needed  Activities of Daily Living   ADL Screening (condition at time of admission) Independently performs ADLs?: Yes  (appropriate for developmental age) Is the patient deaf or have difficulty hearing?: Yes Does the patient have difficulty seeing, even when wearing glasses/contacts?: No Does the patient have difficulty concentrating, remembering, or making decisions?: No  Permission Sought/Granted                  Emotional Assessment Appearance:: Appears stated age Attitude/Demeanor/Rapport: Engaged Affect (typically observed): Irritable Orientation: : Oriented to Self, Oriented to Place, Oriented to  Time      Admission diagnosis:  Left hip pain [M25.552] Patient Active Problem List   Diagnosis Date Noted   Left hip pain 07/19/2023   Vaginal lesion 07/19/2023   Closed fracture of left hip (HCC) 06/15/2022   Closed comminuted intertrochanteric fracture of left femur (HCC) 06/14/2022   Hypoalbuminemia due to protein-calorie malnutrition (HCC) 06/14/2022   Type 2 diabetes mellitus with hyperglycemia (HCC) 06/14/2022   Mixed hyperlipidemia 06/14/2022   Elevated lipase 06/14/2022   Obesity (BMI 30-39.9) 06/14/2022   DVT (deep venous thrombosis) (HCC) 06/14/2022   Cirrhosis of liver without ascites (HCC) 06/24/2021   Pleural effusion, right    Acute diastolic CHF (congestive heart failure) (HCC) 05/23/2019   Acute respiratory failure with hypoxia (HCC) 05/23/2019   Hypokalemia 05/23/2019   Essential hypertension 05/23/2019   Type 2 diabetes mellitus with other specified complication (HCC) 05/23/2019   Anxiety 05/23/2019   CHF (congestive heart failure) (HCC) 05/21/2019   Anal cancer (HCC)  02/01/2019   Mass of anus 01/24/2019   Presbycusis of both ears 03/18/2017   Sudden hearing loss, left 03/18/2017   PCP:  Benita Stabile, MD Pharmacy:   [facility] - [place], [place] - [address] [place] [address] Phone: [phone] Fax: [phone]     Social Drivers of Health (SDOH) Social History: SDOH Screenings   Food Insecurity: Unknown (07/19/2023)  Housing:  Unknown (07/19/2023)  Transportation Needs: Patient Unable To Answer (07/19/2023)  Utilities: Patient Unable To Answer (07/19/2023)  Depression (PHQ2-9): High Risk (02/05/2022)  Financial Resource Strain: Low Risk  (07/18/2023)  Physical Activity: Unknown (07/18/2023)  Social Connections: Unknown (07/19/2023)  Stress: Stress Concern Present (07/18/2023)  Tobacco Use: Low Risk  (07/19/2023)   SDOH Interventions:     Readmission Risk Interventions     No data to display

## 2023-07-25 DIAGNOSIS — M25552 Pain in left hip: Secondary | ICD-10-CM | POA: Diagnosis not present

## 2023-07-25 LAB — AEROBIC/ANAEROBIC CULTURE W GRAM STAIN (SURGICAL/DEEP WOUND): Culture: NO GROWTH

## 2023-07-25 NOTE — Progress Notes (Signed)
 [facility] Progress Note Patient: Jacqueline Bird ZOX:[id number] DOB: 1950/07/02 DOA: 07/19/2023  DOS: the patient was seen and examined on 07/25/2023  Brief Hospital Course: PMH of anal cancer, diastolic CHF, DVT, liver cirrhosis and HTN presented to the hospital with complaints of abnormal CT scan. Had a fall 6 weeks ago and was found to have acetabular destruction with fluid and gas. Initially presented to [facility] and was transferred to [facility]. Eval by orthopedics.  And recommendation is for conservative management. Underwent IR guided biopsy which is negative for any malignant cells and only shows inflammation. Currently plan is for pain control and outpatient follow-up versus palliative care.  Likely SNF on Monday.   Assessment and plan. Left hip pain with destructive changes. Biopsy negative for any malignancy. Orthopedics recommending conservative measures. They do not think the patient will be a candidate for extensive reconstructive surgery although palliation can be offered. Cultures so far have been negative therefore we will be holding off on antibiotics. Discussed with orthopedic as well as ID on 3/6.  Recommendation is for outpatient follow-up with orthopedic oncologist and further workup there only.  Aspiration cultures were negative for any active infection.   History of left leg DVT. Significant edema still present. Continue anticoagulation.   Type of diabetes mellitus. Continue sliding scale insulin.   Hypomagnesemia hypokalemia. Replaced.   History of anal cancer. Admitting provider discussed with patient's primary oncologist patient is currently on surveillance.  Concern for malignancy metastasizing in the left hip area.  Will monitor.   Chronic HFpEF. Stable.  Monitor.   Muscle spasm. Due to electrolyte abnormality. Robaxin.   Goals of care conversation. Palliative care was consulted. Patient with prior history of anal cancer  with failure to thrive outpatient Per family patient has poor quality of life lately. She lives alone all by herself. Patient told palliative care that she would like to be DNR. Family members were at bedside, we had a conversation in front of the patient about goals of care.  Prognosis was guarded given her bedbound status for now. Recommendations were discussed. Family is leaning more towards comfort approach but would like to attempt SNF if possible with adequate pain control. Will monitor.   Subjective: No new complaint.  Pain well-controlled.  Actually more alert today and interactive.  Physical Exam: Clear to auscultation. Left leg edema. Bowel sound present.  Data Reviewed: I have Reviewed nursing notes, Vitals, and Lab results.  Disposition: Status is: Inpatient Remains inpatient appropriate because: Awaiting placement.   Family Communication: No one at bedside. Level of care: Med-Surg   Vitals:   07/24/23 2143 07/25/23 0532 07/25/23 0746 07/25/23 1445  BP: (!) 136/56 (!) 114/49 (!) 124/54 (!) 116/50  Pulse: 86 83 85 87  Resp:   16 16  Temp: 98.5 F (36.9 C) 99 F (37.2 C) 98.1 F (36.7 C) 98.1 F (36.7 C)  TempSrc: Oral     SpO2: 95% 94% 95% 96%  Weight:      Height:         Author: Lynden Oxford, MD 07/25/2023 7:13 PM  Please look on [email or web address] to find out who is on call.

## 2023-07-25 NOTE — Plan of Care (Signed)

## 2023-07-25 NOTE — Progress Notes (Signed)
 Physical Therapy Treatment Patient Details Name: Jacqueline Bird MRN: [id number] DOB: 1950/05/30 Today's Date: 07/25/2023   History of Present Illness Pt is a 73 y.o female presented to ED 07/19/23 by her oncologist after findings on pelvic x-ray. CT scan showed destructive changes of L hip. Orthopedic recommended conservative management. TDWB. PMH: cervical cancer, diastolic CHF, DVT, liver cirrhosis, L wrist disarticulation, DM and HTN    PT Comments  Pt in bed upon arrival and agreeable to PT session. Worked on transfers and LE strength in today's session. Pt continues to need ModA for bed mobility with increased difficulty moving left LE. Pt is able to verbalize left LE WB precautions, however, is unable to maintain when standing and transferring. Pt required MinAx2 to stand and ModAx2 to step pivot towards the recliner. PT attempted to float left LE to maintain WB precautions, however, pt became agitated and did not want assistance. Pt would continue to benefit from <3hrs post acute rehab to work on mobility and gain independence. Pt is progressing towards goals. Acute PT to follow.      If plan is discharge home, recommend the following: A lot of help with walking and/or transfers;Assistance with cooking/housework;Assistance with feeding;A little help with bathing/dressing/bathroom;Assist for transportation;Help with stairs or ramp for entrance   Can travel by private vehicle     No  Equipment Recommendations  None recommended by PT       Precautions / Restrictions Precautions Precautions: Fall Recall of Precautions/Restrictions: Impaired Precaution/Restrictions Comments: Unable to maintain TDWB left LE for transfer Restrictions Weight Bearing Restrictions Per Provider Order: Yes LLE Weight Bearing Per Provider Order: Touchdown weight bearing     Mobility  Bed Mobility Overal bed mobility: Needs Assistance Bed Mobility: Rolling, Sidelying to Sit Rolling: Mod assist, Used  rails Sidelying to sit: Mod assist, Used rails, HOB elevated      General bed mobility comments: ModA to guide L LE towards EOB and assist with raising trunk. Pt able to scoot hips towards EOB with increased time and effort.    Transfers Overall transfer level: Needs assistance Equipment used: Left platform walker Transfers: Sit to/from Stand, Bed to chair/wheelchair/BSC Sit to Stand: +2 physical assistance, +2 safety/equipment, From elevated surface, Min assist   Step pivot transfers: Mod assist, +2 physical assistance, +2 safety/equipment     General transfer comment: MinAx2 to stand from EOB for boost up and steadying. Cues for hand placement. Pt unable to maintain WB precautions with PT attempting to float L LE. Pt became agitated and did not want any assistance to keep WB precautions. ModAx2 to step pivot to recliner    Ambulation/Gait    General Gait Details: unable to progress         Balance Overall balance assessment: Needs assistance Sitting-balance support: No upper extremity supported, Feet supported Sitting balance-Leahy Scale: Fair     Standing balance support: Bilateral upper extremity supported, During functional activity, Reliant on assistive device for balance Standing balance-Leahy Scale: Poor Standing balance comment: reliant on external support and AD       Communication Communication Communication: Impaired Factors Affecting Communication: Hearing impaired  Cognition Arousal: Alert Behavior During Therapy: Flat affect   PT - Cognitive impairments: Orientation, Problem solving, Safety/Judgement, Sequencing   Orientation impairments: Time      PT - Cognition Comments: decreased safety awareness, able to verbalize WB precautions but was not able to maintain with stand/transfer. Had difficulty understanding why PT was assisting to maintain WB precautions Following commands: Intact  Cueing Cueing Techniques: Verbal cues, Tactile cues, Visual  cues  Exercises General Exercises - Lower Extremity Ankle Circles/Pumps: AROM, Both, 10 reps, Supine Quad Sets: AROM, Both, 10 reps, Supine Long Arc Quad: AROM, Both, 10 reps, Seated Hip Flexion/Marching: AROM, Right, 10 reps, Seated Toe Raises: AROM, Both, 10 reps, Seated    General Comments General comments (skin integrity, edema, etc.): VSS on RA      Pertinent Vitals/Pain Pain Assessment Pain Assessment: Faces Faces Pain Scale: Hurts little more Pain Location: left hip Pain Descriptors / Indicators: Aching, Grimacing, Guarding, Discomfort Pain Intervention(s): Limited activity within patient's tolerance, Monitored during session, Repositioned, RN gave pain meds during session     PT Goals (current goals can now be found in the care plan section) Acute Rehab PT Goals PT Goal Formulation: With patient Time For Goal Achievement: 08/05/23 Potential to Achieve Goals: Good Progress towards PT goals: Progressing toward goals    Frequency    Min 2X/week       AM-PAC PT "6 Clicks" Mobility   Outcome Measure  Help needed turning from your back to your side while in a flat bed without using bedrails?: A Lot Help needed moving from lying on your back to sitting on the side of a flat bed without using bedrails?: A Lot Help needed moving to and from a bed to a chair (including a wheelchair)?: Total Help needed standing up from a chair using your arms (e.g., wheelchair or bedside chair)?: Total Help needed to walk in hospital room?: Total Help needed climbing 3-5 steps with a railing? : Total 6 Click Score: 8    End of Session Equipment Utilized During Treatment: Gait belt Activity Tolerance: Patient tolerated treatment well Patient left: in chair;with call bell/phone within reach;with chair alarm set Nurse Communication: Mobility status;Weight bearing status PT Visit Diagnosis: Unsteadiness on feet (R26.81);Muscle weakness (generalized) (M62.81);Pain Pain - Right/Left:  Left Pain - part of body: Hip     Time: 9147-8295 PT Time Calculation (min) (ACUTE ONLY): 21 min  Charges:    $Therapeutic Activity: 8-22 mins PT General Charges $$ ACUTE PT VISIT: 1 Visit           Jacqueline Bird, PT, DPT Secure Chat Preferred  Rehab Office [phone]   Arturo Morton Brion Aliment 07/25/2023, 12:42 PM

## 2023-07-26 DIAGNOSIS — M6281 Muscle weakness (generalized): Secondary | ICD-10-CM | POA: Diagnosis not present

## 2023-07-26 DIAGNOSIS — C210 Malignant neoplasm of anus, unspecified: Secondary | ICD-10-CM | POA: Diagnosis not present

## 2023-07-26 DIAGNOSIS — N898 Other specified noninflammatory disorders of vagina: Secondary | ICD-10-CM | POA: Diagnosis not present

## 2023-07-26 DIAGNOSIS — I5031 Acute diastolic (congestive) heart failure: Secondary | ICD-10-CM | POA: Diagnosis not present

## 2023-07-26 DIAGNOSIS — I959 Hypotension, unspecified: Secondary | ICD-10-CM | POA: Diagnosis not present

## 2023-07-26 DIAGNOSIS — L89616 Pressure-induced deep tissue damage of right heel: Secondary | ICD-10-CM | POA: Diagnosis not present

## 2023-07-26 DIAGNOSIS — Z515 Encounter for palliative care: Secondary | ICD-10-CM | POA: Diagnosis not present

## 2023-07-26 DIAGNOSIS — E876 Hypokalemia: Secondary | ICD-10-CM | POA: Diagnosis not present

## 2023-07-26 DIAGNOSIS — I10 Essential (primary) hypertension: Secondary | ICD-10-CM | POA: Diagnosis not present

## 2023-07-26 DIAGNOSIS — F419 Anxiety disorder, unspecified: Secondary | ICD-10-CM | POA: Diagnosis not present

## 2023-07-26 DIAGNOSIS — E785 Hyperlipidemia, unspecified: Secondary | ICD-10-CM | POA: Diagnosis not present

## 2023-07-26 DIAGNOSIS — M25552 Pain in left hip: Secondary | ICD-10-CM | POA: Diagnosis not present

## 2023-07-26 DIAGNOSIS — Z743 Need for continuous supervision: Secondary | ICD-10-CM | POA: Diagnosis not present

## 2023-07-26 DIAGNOSIS — R278 Other lack of coordination: Secondary | ICD-10-CM | POA: Diagnosis not present

## 2023-07-26 DIAGNOSIS — J9601 Acute respiratory failure with hypoxia: Secondary | ICD-10-CM | POA: Diagnosis not present

## 2023-07-26 DIAGNOSIS — E1169 Type 2 diabetes mellitus with other specified complication: Secondary | ICD-10-CM | POA: Diagnosis not present

## 2023-07-26 DIAGNOSIS — E1165 Type 2 diabetes mellitus with hyperglycemia: Secondary | ICD-10-CM | POA: Diagnosis not present

## 2023-07-26 DIAGNOSIS — Z7901 Long term (current) use of anticoagulants: Secondary | ICD-10-CM | POA: Diagnosis not present

## 2023-07-26 MED ORDER — METHOCARBAMOL 500 MG PO TABS: 500.0000 mg | ORAL_TABLET | Freq: Four times a day (QID) | ORAL | 0 refills | Status: AC | PRN

## 2023-07-26 MED ORDER — INSULIN ASPART 100 UNIT/ML FLEXPEN: 0.0000 [IU] | PEN_INJECTOR | Freq: Three times a day (TID) | SUBCUTANEOUS | 0 refills | Status: AC

## 2023-07-26 MED ORDER — ALPRAZOLAM 0.5 MG PO TABS: 0.5000 mg | ORAL_TABLET | Freq: Every day | ORAL | 0 refills | Status: AC

## 2023-07-26 MED ORDER — TRAMADOL HCL 50 MG PO TABS: 50.0000 mg | ORAL_TABLET | Freq: Two times a day (BID) | ORAL | 0 refills | Status: AC | PRN

## 2023-07-26 MED ORDER — SENNOSIDES-DOCUSATE SODIUM 8.6-50 MG PO TABS: 1.0000 | ORAL_TABLET | Freq: Two times a day (BID) | ORAL | 0 refills | Status: AC

## 2023-07-26 MED ORDER — OXYCODONE HCL 5 MG PO TABS: 5.0000 mg | ORAL_TABLET | ORAL | 0 refills | Status: AC | PRN

## 2023-07-26 NOTE — Progress Notes (Signed)
   07/26/23 1100  Spiritual Encounters  Type of Visit Initial  Care provided to: Pt and family  Referral source Social worker/Care management/TOC  Reason for visit Advance directives   Chaplain was paged to notarize the [facility] document. Chaplain contacted with the notary person. The document couldn't be notarized at this time due to a insufficient number of witnesses. The witnesses will be checked again at 12:15.  M.Graciela Husbands Chaplain Resident [phone]

## 2023-07-26 NOTE — TOC Transition Note (Signed)
 Transition of Care [facility]) - Discharge Note   Patient Details  Name: Jacqueline Bird MRN: [id number] Date of Birth: 1950/08/08  [facility]) CM/SW Contact:  Lorri Frederick, LCSW Phone Number: 07/26/2023, 12:16 PM   Clinical Narrative:   Pt discharging to [facility].  RN call report to [phone].      Final next level of care: Skilled Nursing Facility Barriers to Discharge: Barriers Resolved   Patient Goals and CMS Choice   CMS Medicare.gov Compare Post Acute Care list provided to:: Patient Represenative (must comment) (Daughter [name], son [name]) Choice offered to / list presented to : Adult Children      Discharge Placement              Patient chooses bed at: [facility] Patient to be transferred to facility by: [facility]tar Name of family member notified: son [name] in room Patient and family notified of of transfer: 07/26/23  Discharge Plan and Services Additional resources added to the After Visit Summary for   In-house Referral: Clinical Social Work   Post Acute Care Choice: Skilled Nursing Facility                               Social Drivers of Health (SDOH) Interventions SDOH Screenings   Food Insecurity: Unknown (07/19/2023)  Housing: Unknown (07/19/2023)  Transportation Needs: Patient Unable To Answer (07/19/2023)  Utilities: Patient Unable To Answer (07/19/2023)  Depression (PHQ2-9): High Risk (02/05/2022)  Financial Resource Strain: Low Risk  (07/18/2023)  Physical Activity: Unknown (07/18/2023)  Social Connections: Unknown (07/19/2023)  Stress: Stress Concern Present (07/18/2023)  Tobacco Use: Low Risk  (07/19/2023)     Readmission Risk Interventions     No data to display

## 2023-07-26 NOTE — Care Management Important Message (Signed)
 Important Message  Patient Details  Name: Jacqueline Bird MRN: [id number] Date of Birth: [birth year]   Important Message Given:  Yes - Medicare IM     Dorena Bodo 07/26/2023, 4:11 PM

## 2023-07-26 NOTE — Discharge Summary (Addendum)
 Physician Discharge Summary   Patient: Jacqueline Bird MRN: [id number] DOB: 17-Mar-1951  Admit date:     07/19/2023  Discharge date: 07/26/23  Discharge Physician: Lynden Oxford  PCP: Benita Stabile, MD  Recommendations at discharge: Follow-up with PCP in 1 week. Follow-up with hematology for further discussion with regards to goals of care. Follow-up with palliative care.   Follow-up Information     Benita Stabile, MD. Schedule an appointment as soon as possible for a visit in 1 week(s).   Specialty: Internal Medicine Contact information: [address] [place] [facility] [address] [phone]         Doreatha Massed, MD. Schedule an appointment as soon as possible for a visit in 1 week(s).   Specialty: Hematology Contact information: [address] [place] [address] [phone]         [facility]. Call.   Why: As needed, if the goal is for further workup of her hip pain. Contact information: Address [facility] 4th [facility] [place], [place] [address]  [phone] - Fri: 8 am - 5 pm [phone]        palliative care. Schedule an appointment as soon as possible for a visit in 1 week(s).                 Discharge Diagnoses: Principal Problem:   Left hip pain Active Problems:   DVT (deep venous thrombosis) (HCC)   Anal cancer (HCC)   Type 2 diabetes mellitus with other specified complication (HCC)   Vaginal lesion   CHF (congestive heart failure) (HCC)   Essential hypertension   Cirrhosis of liver without ascites [facility])    Hospital Course: PMH of anal cancer, diastolic CHF, DVT, liver cirrhosis and HTN presented to the hospital with complaints of abnormal CT scan. Had a fall 6 weeks ago and was found to have acetabular destruction with fluid and gas. Initially presented to [facility] and was transferred to [facility]. Eval by orthopedics.  And recommendation is for conservative  management. Underwent IR guided biopsy which is negative for any malignant cells and only shows inflammation. Currently plan is for pain control and outpatient follow-up versus palliative care.  Plan is to continue SNF for now.   Assessment and plan. Left hip pain with destructive changes. Biopsy negative for any malignancy. Orthopedics recommending conservative measures. They do not think the patient will be a candidate for extensive reconstructive surgery although palliation can be offered. Cultures so far have been negative therefore we will be holding off on antibiotics. Discussed with orthopedic as well as ID on 3/6.  Recommendation is for outpatient follow-up with orthopedic oncologist and further workup there only.  Aspiration cultures were negative for any active infection.   History of left leg DVT. Significant edema still present. Continue anticoagulation.   Type of diabetes mellitus.  Controlled. Continue sliding scale insulin. Home med includes Beaulah Corin, NovoLog FlexPen, Trulicity. Patient did not require any significant insulin here in the hospital. For now I would recommend to discontinue all medication and discontinue Farxiga and NovoLog FlexPen sliding scale.   Hypomagnesemia hypokalemia. Replaced.   History of anal cancer. Admitting provider discussed with patient's primary oncologist patient is currently on surveillance.  Concern for malignancy metastasizing in the left hip area.  Will monitor.   Chronic HFpEF. Stable.  Monitor.   Muscle spasm. Due to electrolyte abnormality. Robaxin.   Goals of care conversation. Palliative care was consulted. Patient with  prior history of anal cancer with failure to thrive outpatient Per family patient has poor quality of life lately. She lives alone all by herself. Patient told palliative care that she would like to be DNR. Family members were at bedside, we had a conversation in front of the patient about goals  of care.  Prognosis was guarded given her bedbound status for now. Recommendations were discussed. Family is leaning more towards comfort approach but would like to attempt SNF if possible with adequate pain control. Will monitor.  Consultants:  Orthopedic surgery, Dr. Jena Gauss  Procedures performed:  IR. CT-GUIDED ASPIRATION OF LEFT HIP/ACETABULUM   DISCHARGE MEDICATION: Allergies as of 07/26/2023       Reactions   Sulfa Antibiotics Anaphylaxis, Swelling   Per pt, can use creams with sulfa in it Only externally tolerated but not internally        Medication List     PAUSE taking these medications    Trulicity 1.5 MG/0.5ML Soaj Wait to take this until your doctor or other care provider tells you to start again. Until seen by PCP Generic drug: Dulaglutide Inject 1.5 mg into the skin every Sunday.       STOP taking these medications    Tresiba FlexTouch 200 UNIT/ML FlexTouch Pen Generic drug: insulin degludec       TAKE these medications    acetaminophen 500 MG tablet Commonly known as: TYLENOL Take 1,000-2,000 mg by mouth every 6 (six) hours as needed for moderate pain (pain score 4-6).   ALPRAZolam 0.5 MG tablet Commonly known as: XANAX Take 1 tablet (0.5 mg total) by mouth at bedtime.   atorvastatin 80 MG tablet Commonly known as: LIPITOR Take 80 mg by mouth daily.   enoxaparin 60 MG/0.6ML injection Commonly known as: LOVENOX Inject 0.6 mLs (60 mg total) into the skin every 12 (twelve) hours.   Farxiga 10 MG Tabs tablet Generic drug: dapagliflozin propanediol Take 1 tablet (10 mg total) by mouth daily.   furosemide 20 MG tablet Commonly known as: LASIX Take 20 mg by mouth daily.   gabapentin 100 MG capsule Commonly known as: NEURONTIN Take 1-2 capsules (100-200 mg total) by mouth See admin instructions. Take 200 mg by mouth in the morning and take 100 mg at night.   insulin aspart 100 UNIT/ML FlexPen Commonly known as: NOVOLOG Inject 0-6 Units  into the skin 3 (three) times daily with meals. Check Blood Glucose (BG) and inject per scale: BG <150= 0 unit; BG 150-200= 1 unit; BG 201-250= 2 unit; BG 251-300= 3 unit; BG 301-350= 4 unit; BG 351-400= 5 unit; BG >400= 6 unit and Call Primary Care. What changed:  how much to take when to take this additional instructions   methocarbamol 500 MG tablet Commonly known as: ROBAXIN Take 1 tablet (500 mg total) by mouth every 6 (six) hours as needed for muscle spasms.   oxyCODONE 5 MG immediate release tablet Commonly known as: Oxy IR/ROXICODONE Take 1 tablet (5 mg total) by mouth every 4 (four) hours as needed for severe pain (pain score 7-10).   senna-docusate 8.6-50 MG tablet Commonly known as: Senokot-S Take 1 tablet by mouth 2 (two) times daily.   sertraline 25 MG tablet Commonly known as: ZOLOFT Take 25 mg by mouth daily. Taking with 100 mg, to equal total dose of 125 mg What changed: Another medication with the same name was changed. Make sure you understand how and when to take each.   sertraline 100 MG tablet Commonly known  as: ZOLOFT Take 1 tablet (100 mg total) by mouth daily. What changed: additional instructions   traMADol 50 MG tablet Commonly known as: ULTRAM Take 1 tablet (50 mg total) by mouth every 12 (twelve) hours as needed for moderate pain (pain score 4-6). What changed:  when to take this reasons to take this   traZODone 50 MG tablet Commonly known as: DESYREL Take 2 tablets (100 mg total) by mouth at bedtime.       Disposition: SNF Diet recommendation: Regular diet  Discharge Exam: Vitals:   07/25/23 1445 07/25/23 1954 07/26/23 0347 07/26/23 0811  BP: (!) 116/50 (!) 105/58 100/87 123/60  Pulse: 87 89 76 85  Resp: 16 19 18 18   Temp: 98.1 F (36.7 C) 98 F (36.7 C) 98 F (36.7 C) 98.5 F (36.9 C)  TempSrc:  Oral Oral   SpO2: 96% 95% 95% 97%  Weight:      Height:       General: Appear in mild distress; no visible Abnormal Neck Mass Or  lumps, Conjunctiva normal Cardiovascular: S1 and S2 Present, no Murmur, Respiratory: good respiratory effort, Bilateral Air entry present and CTA, no Crackles, no wheezes Abdomen: Bowel Sound present, Non tender  Extremities: Left leg edema Neurology: alert and oriented to time, place, and person  Filed Weights   07/19/23 1415  Weight: 64 kg   Condition at discharge: stable  The results of significant diagnostics from this hospitalization (including imaging, microbiology, ancillary and laboratory) are listed below for reference.   Imaging Studies: DG Pelvis Comp Min 3V Result Date: 07/20/2023 CLINICAL DATA:  Known left acetabular lytic lesion, initial encounter EXAM: JUDET PELVIS - 3+ VIEW COMPARISON:  07/12/2023 FINDINGS: Postsurgical changes are noted in the proximal left hip. The roast of changes involving the left acetabulum and superior pubic ramus are faintly visualized laterally. Pelvic ring is otherwise intact. No fracture is noted. IMPRESSION: Lytic lesion involving the acetabulum and lateral aspect of the left superior pubic ramus consistent with the known history. Electronically Signed   By: Alcide Clever M.D.   On: 07/20/2023 21:04   CT GUIDED NEEDLE PLACEMENT Result Date: 07/20/2023 INDICATION: 73 year old with anal cancer. Patient has a destructive lesion involving the left anterior acetabulum and anterior left hip. Need to evaluate for infection or malignancy. EXAM: CT-GUIDED ASPIRATION OF LEFT HIP/ACETABULUM MEDICATIONS: Benadryl 50 mg IV ANESTHESIA/SEDATION: Moderate (conscious) sedation was employed during this procedure. A total of Versed 2.5 mg and fentanyl 100 mcg was administered intravenously at the order of the provider performing the procedure. Total intra-service moderate sedation time: 24 minutes. Patient's level of consciousness and vital signs were monitored continuously by radiology nurse throughout the procedure under the supervision of the provider performing the  procedure. COMPLICATIONS: None immediate. PROCEDURE: Informed written consent was obtained from the patient after a thorough discussion of the procedural risks, benefits and alternatives. All questions were addressed. A timeout was performed prior to the initiation of the procedure. Patient was placed supine and CT images were obtained. CT images through the pelvis were obtained. Left lateral hip was prepped with chlorhexidine and sterile field was created. Skin was anesthetized using 1% lidocaine. Small incision was made. Using CT guidance, 17 gauge coaxial needle was directed into the low-density material anterior and just superior to the left hip joint. Approximately 2 mL of amber colored fluid was aspirated. Needle was directed more inferiorly into the anterior left hip joint. A small amount of thick material was aspirated which could be purulent. These  fluids were sent for culture and cytology. Bandage placed over the puncture site. RADIATION DOSE REDUCTION: This exam was performed according to the departmental dose-optimization program which includes automated exposure control, adjustment of the mA and/or kV according to patient size and/or use of iterative reconstruction technique. FINDINGS: Bone destruction along the anterior superior aspect of the left hip joint involving the acetabulum and left ilium. Findings are similar to the exam on 07/12/2023. IMPRESSION: CT-guided aspiration of fluid from the complex left hip/acetabular collection/lesion. Electronically Signed   By: Richarda Overlie M.D.   On: 07/20/2023 17:22   CT Abdomen Pelvis W Contrast Result Date: 07/17/2023 CLINICAL DATA:  Restaging anal cancer.  * Tracking Code: BO * EXAM: CT ABDOMEN AND PELVIS WITH CONTRAST TECHNIQUE: Multidetector CT imaging of the abdomen and pelvis was performed using the standard protocol following bolus administration of intravenous contrast. RADIATION DOSE REDUCTION: This exam was performed according to the departmental  dose-optimization program which includes automated exposure control, adjustment of the mA and/or kV according to patient size and/or use of iterative reconstruction technique. CONTRAST:  OMNIPAQUE IOHEXOL 300 MG/ML  SOLN COMPARISON:  Multiple prior imaging studies. The most recent CT scan 2022/10/27 FINDINGS: Lower chest: The lung bases are clear of acute process. No pleural effusion or pulmonary lesions. The heart is normal in size. No pericardial effusion. The distal esophagus and aorta are unremarkable. Hepatobiliary: No focal liver abnormality is seen. No gallstones, gallbladder wall thickening, or biliary dilatation. Pancreas: Unremarkable. No pancreatic ductal dilatation or surrounding inflammatory changes. Spleen: Normal in size without focal abnormality. Adrenals/Urinary Tract: Stable medullary nephrocalcinosis. No hydronephrosis. No renal or adrenal gland lesions. Stomach/Bowel: The stomach, duodenum, small bowel and colon are grossly. No acute inflammatory process, mass lesions or obstructive findings. Vascular/Lymphatic: Stable atherosclerotic calcifications involving the aorta and iliac arteries and branch vessels. No aneurysm or dissection. The major venous structures are patent. New borderline enlarged left-sided retroperitoneal lymph nodes measuring up to 9 mm. This could be inflammatory/reactive given the left pelvic findings. Reproductive: The uterus is grossly.  No adnexal mass. Other: The left acetabulum is destroyed. Extensive lytic process but also containing some fluid and gas. Possible secondary infection/abscess. The left femoral head is intact. This would make septic arthritis very unlikely. There is surgical hardware in the left hip from a previous fracture. In retrospect, there were some small lytic lesions involving the acetabulum on the prior CT scan. Recommend image guided biopsy/aspiration. Possible cystic/necrotic left-sided pelvic nodes. Small rim enhancing lesion in the region  of the right lower vagina not present prior study and indeterminate finding. Musculoskeletal: No other lytic bone lesions are identified. Remote healed sacral insufficiency fractures. Remote compression fracture of T11. IMPRESSION: 1. The left acetabulum is destroyed. Extensive lytic process but also containing some fluid and gas. Possible secondary infection/abscess. The left femoral head is intact. This would make septic arthritis very unlikely. Recommend image guided biopsy/aspiration. No other lytic bone lesions are identified. 2. Possible cystic/necrotic left-sided pelvic nodes. 3. New borderline enlarged left-sided retroperitoneal lymph nodes measuring up to 9 mm. This could be inflammatory/reactive given the left pelvic findings. 4. Small rim enhancing lesion in the region of the right lower vagina not present on prior study and indeterminate finding. 5. Once the left acetabular process has been appropriately diagnosed a PET-CT at some point may be helpful further evaluation. 6. Stable medullary nephrocalcinosis. 7. Stable atherosclerotic calcifications involving the aorta and iliac arteries and branch vessels. 8. Aortic atherosclerosis. These results will be called  to the ordering clinician or representative by the Radiologist Assistant, and communication documented in the PACS or [employer]. Electronically Signed   By: Rudie Meyer M.D.   On: 07/17/2023 11:22    Microbiology: Results for orders placed or performed during the hospital encounter of 07/19/23  Blood culture (routine x 2)     Status: None   Collection Time: 07/19/23  5:05 PM   Specimen: BLOOD  Result Value Ref Range Status   Specimen Description BLOOD PORT  Final   Special Requests   Final    BOTTLES DRAWN AEROBIC AND ANAEROBIC Blood Culture adequate volume   Culture   Final    NO GROWTH 5 DAYS Performed at [facility], [address], [place], [place] [address]    Report Status 07/24/2023 FINAL  Final  Blood culture  (routine x 2)     Status: None   Collection Time: 07/19/23  5:15 PM   Specimen: BLOOD  Result Value Ref Range Status   Specimen Description BLOOD RIGHT ANTECUBITAL  Final   Special Requests   Final    BOTTLES DRAWN AEROBIC AND ANAEROBIC Blood Culture results may not be optimal due to an inadequate volume of blood received in culture bottles   Culture   Final    NO GROWTH 5 DAYS Performed at [facility], [address] [place], [place] [address]    Report Status 07/24/2023 FINAL  Final  Aerobic/Anaerobic Culture w Gram Stain (surgical/deep wound)     Status: None   Collection Time: 07/20/23  5:01 PM   Specimen: Abscess  Result Value Ref Range Status   Specimen Description ABSCESS  Final   Special Requests NONE  Final   Gram Stain   Final    RARE WBC PRESENT, PREDOMINANTLY PMN NO ORGANISMS SEEN    Culture   Final    No growth aerobically or anaerobically. Performed at [facility], [address] [place], [place] [address]    Report Status 07/25/2023 FINAL  Final   Labs: CBC: Recent Labs  Lab 07/19/23 1705 07/21/23 1445  WBC 10.9* 10.5  NEUTROABS 9.3* 9.0*  HGB 11.6* 12.0  HCT 37.1 37.8  MCV 84.9 84.4  PLT 247 315   Basic Metabolic Panel: Recent Labs  Lab 07/19/23 1705 07/21/23 1445 07/22/23 0630 07/23/23 0316 07/24/23 0315  NA 133* 132* 133* 133* 135  K 2.9* 4.2 3.6 3.3* 3.5  CL 95* 94* 98 99 100  CO2 28 26 27 29 28   GLUCOSE 149* 206* 195* 174* 137*  BUN 11 11 11 9  6*  CREATININE 0.51 0.80 0.61 0.53 0.46  CALCIUM 9.2 9.4 9.2 8.4* 8.2*  MG  --  1.5* 2.4 2.0 1.7   Liver Function Tests: Recent Labs  Lab 07/19/23 1705  AST 14*  ALT 14  ALKPHOS 78  BILITOT 0.4  PROT 6.0*  ALBUMIN 2.2*   CBG: Recent Labs  Lab 07/19/23 1545 07/19/23 2028 07/19/23 2119  GLUCAP 78 69* 108*    Discharge time spent: greater than 30 minutes.  Author: Lynden Oxford, MD  [facility]

## 2023-07-26 NOTE — TOC Progression Note (Signed)
 Transition of Care [facility]) - Progression Note    Patient Details  Name: Jacqueline Bird MRN: [id number] Date of Birth: 09/07/1950  Transition of [facility]) CM/SW Contact  Lorri Frederick, LCSW Phone Number: 07/26/2023, 10:23 AM  Clinical Narrative:  CSW confirmed with HTA/Tammy: SNF auth approved for 7 days: [id number], PTAR approved: [id number].  CSW confirmed with [facility] that they can receive pt today.  Only semi private available today but will have private available tomorrow.  CSW spoke with son  [name] and informed him.    Expected Discharge Plan: Skilled Nursing Facility Barriers to Discharge: Continued Medical Work up, SNF Pending bed offer  Expected Discharge Plan and Services In-house Referral: Clinical Social Work   Post Acute Care Choice: Skilled Nursing Facility Living arrangements for the past 2 months: Single Family Home                                       Social Determinants of Health (SDOH) Interventions SDOH Screenings   Food Insecurity: Unknown (07/19/2023)  Housing: Unknown (07/19/2023)  Transportation Needs: Patient Unable To Answer (07/19/2023)  Utilities: Patient Unable To Answer (07/19/2023)  Depression (PHQ2-9): High Risk (02/05/2022)  Financial Resource Strain: Low Risk  (07/18/2023)  Physical Activity: Unknown (07/18/2023)  Social Connections: Unknown (07/19/2023)  Stress: Stress Concern Present (07/18/2023)  Tobacco Use: Low Risk  (07/19/2023)    Readmission Risk Interventions     No data to display

## 2023-07-27 ENCOUNTER — Telehealth: Payer: Self-pay | Admitting: *Deleted

## 2023-07-27 NOTE — Telephone Encounter (Signed)
 Received call from daughter [name] to cancel future appointments.  Patient is currently in Rehab at [facility] in [place].  They are discussing Palliative care vs Hospice at this point and do not wish to return for further management.

## 2023-07-29 ENCOUNTER — Inpatient Hospital Stay: Admitting: Hematology

## 2023-07-29 DIAGNOSIS — Z7901 Long term (current) use of anticoagulants: Secondary | ICD-10-CM | POA: Diagnosis not present

## 2023-07-29 DIAGNOSIS — I10 Essential (primary) hypertension: Secondary | ICD-10-CM | POA: Diagnosis not present

## 2023-07-29 DIAGNOSIS — E1169 Type 2 diabetes mellitus with other specified complication: Secondary | ICD-10-CM | POA: Diagnosis not present

## 2023-07-29 DIAGNOSIS — M25552 Pain in left hip: Secondary | ICD-10-CM | POA: Diagnosis not present

## 2023-08-02 DIAGNOSIS — L89616 Pressure-induced deep tissue damage of right heel: Secondary | ICD-10-CM | POA: Diagnosis not present

## 2023-08-02 DIAGNOSIS — C210 Malignant neoplasm of anus, unspecified: Secondary | ICD-10-CM | POA: Diagnosis not present

## 2023-08-02 DIAGNOSIS — E876 Hypokalemia: Secondary | ICD-10-CM | POA: Diagnosis not present

## 2023-08-02 DIAGNOSIS — E785 Hyperlipidemia, unspecified: Secondary | ICD-10-CM | POA: Diagnosis not present

## 2023-08-08 ENCOUNTER — Other Ambulatory Visit: Payer: Self-pay | Admitting: Internal Medicine

## 2023-08-08 MED ORDER — OXYCODONE HCL 5 MG PO TABS: 5.0000 mg | ORAL_TABLET | Freq: Three times a day (TID) | ORAL | 0 refills | Status: AC | PRN

## 2023-08-09 DIAGNOSIS — E785 Hyperlipidemia, unspecified: Secondary | ICD-10-CM | POA: Diagnosis not present

## 2023-08-09 DIAGNOSIS — L89616 Pressure-induced deep tissue damage of right heel: Secondary | ICD-10-CM | POA: Diagnosis not present

## 2023-08-09 DIAGNOSIS — E876 Hypokalemia: Secondary | ICD-10-CM | POA: Diagnosis not present

## 2023-08-09 DIAGNOSIS — C210 Malignant neoplasm of anus, unspecified: Secondary | ICD-10-CM | POA: Diagnosis not present

## 2023-08-10 DIAGNOSIS — Z515 Encounter for palliative care: Secondary | ICD-10-CM | POA: Diagnosis not present

## 2023-09-16 DEATH — deceased
